# Patient Record
Sex: Female | Born: 1935
Health system: Southern US, Community
[De-identification: ages and names within clinical notes are randomized; demographics above are authoritative.]

## PROBLEM LIST (undated history)

## (undated) DIAGNOSIS — M5430 Sciatica, unspecified side: Secondary | ICD-10-CM

## (undated) DIAGNOSIS — M4120 Other idiopathic scoliosis, site unspecified: Secondary | ICD-10-CM

## (undated) DIAGNOSIS — E049 Nontoxic goiter, unspecified: Secondary | ICD-10-CM

## (undated) DIAGNOSIS — K279 Peptic ulcer, site unspecified, unspecified as acute or chronic, without hemorrhage or perforation: Secondary | ICD-10-CM

## (undated) DIAGNOSIS — N289 Disorder of kidney and ureter, unspecified: Secondary | ICD-10-CM

## (undated) DIAGNOSIS — E042 Nontoxic multinodular goiter: Secondary | ICD-10-CM

## (undated) DIAGNOSIS — R350 Frequency of micturition: Secondary | ICD-10-CM

## (undated) DIAGNOSIS — N281 Cyst of kidney, acquired: Secondary | ICD-10-CM

## (undated) DIAGNOSIS — F1020 Alcohol dependence, uncomplicated: Secondary | ICD-10-CM

## (undated) DIAGNOSIS — B009 Herpesviral infection, unspecified: Secondary | ICD-10-CM

## (undated) DIAGNOSIS — D649 Anemia, unspecified: Secondary | ICD-10-CM

## (undated) DIAGNOSIS — F419 Anxiety disorder, unspecified: Secondary | ICD-10-CM

## (undated) DIAGNOSIS — K8020 Calculus of gallbladder without cholecystitis without obstruction: Secondary | ICD-10-CM

## (undated) DIAGNOSIS — H353130 Nonexudative age-related macular degeneration, bilateral, stage unspecified: Secondary | ICD-10-CM

## (undated) DIAGNOSIS — N809 Endometriosis, unspecified: Secondary | ICD-10-CM

## (undated) DIAGNOSIS — E785 Hyperlipidemia, unspecified: Secondary | ICD-10-CM

## (undated) DIAGNOSIS — R7989 Other specified abnormal findings of blood chemistry: Secondary | ICD-10-CM

## (undated) DIAGNOSIS — I10 Essential (primary) hypertension: Secondary | ICD-10-CM

## (undated) DIAGNOSIS — R634 Abnormal weight loss: Secondary | ICD-10-CM

## (undated) HISTORY — DX: Essential (primary) hypertension: I10

## (undated) HISTORY — PX: STOMACH SURGERY: SHX791

## (undated) HISTORY — PX: REPAIR OF PERFORATED ULCER: SHX6065

## (undated) HISTORY — DX: Sciatica, unspecified side: M54.30

## (undated) HISTORY — DX: Cyst of kidney, acquired: N28.1

## (undated) HISTORY — PX: TONSILLECTOMY: SUR1361

## (undated) HISTORY — DX: Frequency of micturition: R35.0

## (undated) HISTORY — DX: Other idiopathic scoliosis, site unspecified: M41.20

## (undated) HISTORY — DX: Endometriosis, unspecified: N80.9

## (undated) HISTORY — DX: Herpesviral infection, unspecified: B00.9

## (undated) HISTORY — DX: Calculus of gallbladder without cholecystitis without obstruction: K80.20

## (undated) HISTORY — DX: Abnormal weight loss: R63.4

## (undated) HISTORY — DX: Peptic ulcer, site unspecified, unspecified as acute or chronic, without hemorrhage or perforation: K27.9

## (undated) HISTORY — DX: Hyperlipidemia, unspecified: E78.5

## (undated) HISTORY — DX: Other specified abnormal findings of blood chemistry: R79.89

## (undated) HISTORY — DX: Nontoxic goiter, unspecified: E04.9

## (undated) HISTORY — DX: Anxiety disorder, unspecified: F41.9

## (undated) HISTORY — DX: Nontoxic multinodular goiter: E04.2

## (undated) HISTORY — DX: Alcohol dependence, uncomplicated: F10.20

## (undated) HISTORY — DX: Disorder of kidney and ureter, unspecified: N28.9

## (undated) HISTORY — DX: Anemia, unspecified: D64.9

---

## 1969-02-16 HISTORY — PX: ABDOMINAL HYSTERECTOMY: SHX81

## 2001-12-22 ENCOUNTER — Other Ambulatory Visit: Admission: RE | Admit: 2001-12-22 | Discharge: 2001-12-22 | Payer: Self-pay | Admitting: Obstetrics and Gynecology

## 2004-06-15 ENCOUNTER — Emergency Department (HOSPITAL_COMMUNITY): Admission: EM | Admit: 2004-06-15 | Discharge: 2004-06-15 | Payer: Self-pay | Admitting: Emergency Medicine

## 2004-12-16 ENCOUNTER — Encounter: Admission: RE | Admit: 2004-12-16 | Discharge: 2004-12-16 | Payer: Self-pay | Admitting: Family Medicine

## 2005-08-07 ENCOUNTER — Encounter: Admission: RE | Admit: 2005-08-07 | Discharge: 2005-08-07 | Payer: Self-pay | Admitting: Family Medicine

## 2005-08-21 ENCOUNTER — Encounter: Admission: RE | Admit: 2005-08-21 | Discharge: 2005-08-21 | Payer: Self-pay | Admitting: Family Medicine

## 2005-12-10 ENCOUNTER — Encounter: Admission: RE | Admit: 2005-12-10 | Discharge: 2005-12-10 | Payer: Self-pay | Admitting: Family Medicine

## 2010-01-22 ENCOUNTER — Encounter
Admission: RE | Admit: 2010-01-22 | Discharge: 2010-01-22 | Payer: Self-pay | Source: Home / Self Care | Admitting: Internal Medicine

## 2010-01-30 ENCOUNTER — Encounter
Admission: RE | Admit: 2010-01-30 | Discharge: 2010-01-30 | Payer: Self-pay | Source: Home / Self Care | Attending: Internal Medicine | Admitting: Internal Medicine

## 2010-03-08 ENCOUNTER — Encounter: Payer: Self-pay | Admitting: Family Medicine

## 2010-03-09 ENCOUNTER — Encounter: Payer: Self-pay | Admitting: Obstetrics & Gynecology

## 2010-12-10 ENCOUNTER — Telehealth: Payer: Self-pay | Admitting: *Deleted

## 2010-12-10 NOTE — Telephone Encounter (Signed)
Opened in error

## 2012-05-11 ENCOUNTER — Other Ambulatory Visit: Payer: Self-pay | Admitting: Geriatric Medicine

## 2012-05-11 MED ORDER — NEBIVOLOL HCL 10 MG PO TABS: ORAL_TABLET | ORAL | Status: DC

## 2012-05-23 ENCOUNTER — Ambulatory Visit (INDEPENDENT_AMBULATORY_CARE_PROVIDER_SITE_OTHER): Payer: PRIVATE HEALTH INSURANCE | Admitting: Internal Medicine

## 2012-05-23 ENCOUNTER — Encounter: Payer: Self-pay | Admitting: Internal Medicine

## 2012-05-23 VITALS — BP 144/82 | HR 71 | Temp 97.1°F | Resp 16 | Ht 65.0 in | Wt 116.8 lb

## 2012-05-23 DIAGNOSIS — E785 Hyperlipidemia, unspecified: Secondary | ICD-10-CM

## 2012-05-23 DIAGNOSIS — I10 Essential (primary) hypertension: Secondary | ICD-10-CM

## 2012-05-23 DIAGNOSIS — R7309 Other abnormal glucose: Secondary | ICD-10-CM

## 2012-05-23 DIAGNOSIS — R739 Hyperglycemia, unspecified: Secondary | ICD-10-CM

## 2012-05-23 DIAGNOSIS — E042 Nontoxic multinodular goiter: Secondary | ICD-10-CM | POA: Insufficient documentation

## 2012-05-23 NOTE — Assessment & Plan Note (Signed)
On vegetarian diet.  Will f/u flp next week when fasting to see where she stands.

## 2012-05-23 NOTE — Progress Notes (Signed)
Patient ID: [name], female   DOB: [birth year], 77 y.o.   MRN: [id number]   No Known Allergies  Chief Complaint  Patient presents with  . Medical Managment of Chronic Issues    HPI: Patient is a 77 y.o.  Caucasian female seen in f/u for her chronic conditions including diet-controlled diabetes, htn, anxiety.  Says no new concerns.  Wonders about having blood work done to see how her labs are.  Seems hard to get enough protein with her couple of mos of vegetarian diet.  Is taking probiotics which is helping with her rapid transit/leaky gut problem.  Very rarely needs her xanax at bedtime.  BP is satisfactory and feels good on these doses.  Had been too much before which made her dizzy.      Review of Systems:  Review of Systems  Constitutional: Negative for fever and chills.  Respiratory: Negative for cough and shortness of breath.   Cardiovascular: Negative for chest pain.  Gastrointestinal: Negative for abdominal pain.       Stools improved with probiotic use  Genitourinary: Negative for dysuria, urgency and frequency.       No incontinence  Musculoskeletal: Negative for myalgias and falls.  Skin: Negative for rash.  Neurological: Negative for dizziness, tingling, weakness and headaches.  Endo/Heme/Allergies: Negative for polydipsia.  Psychiatric/Behavioral: Negative for depression and memory loss. The patient is not nervous/anxious.      Past Medical History  Diagnosis Date  . Type II or unspecified type diabetes mellitus without mention of complication, uncontrolled   . Hypertension   . Anxiety   . Hyperlipidemia   . Herpes simplex   . Goiter   . Renal cyst   . PUD (peptic ulcer disease)   . Endometriosis   . Unspecified disorder of kidney and ureter   . Sciatica   . Scoliosis (and kyphoscoliosis), idiopathic   . Loss of weight   . Urinary frequency   . Other abnormal blood chemistry   . Cholelithiasis   . Peptic ulcer   . Nontoxic multinodular goiter    Past  Surgical History  Procedure Laterality Date  . Abdominal hysterectomy  1971  . Repair of perforated ulcer  1973  . Stomach surgery  1977   Social History:   reports that she has quit smoking. She does not have any smokeless tobacco history on file. She reports that she does not drink alcohol or use illicit drugs.  History reviewed. No pertinent family history.  Medications: Patient's Medications  New Prescriptions   No medications on file  Previous Medications   ALPRAZOLAM (XANAX) 0.25 MG TABLET    Take 1/2 tablet by mouth once daily as needed for anxiety   AMLODIPINE-VALSARTAN (EXFORGE) 10-320 MG PER TABLET    Take 1 tablet by mouth daily. For blood pressure   NEBIVOLOL (BYSTOLIC) 10 MG TABLET    Take 1/2 tablet by mouth once daily for blood pressure.   PROBIOTIC PRODUCT (PROBIOTIC DAILY PO)    Take by mouth. Take one tablet by mouth once daily for stomach  Modified Medications   No medications on file  Discontinued Medications   No medications on file     Physical Exam:  Filed Vitals:   05/23/12 1415  BP: 144/82  Pulse: 71  Temp: 97.1 F (36.2 C)  TempSrc: Oral  Resp: 16  Height: 5\' 5"  (1.651 m)  Weight: 116 lb 12.8 oz (52.98 kg)  Physical Exam  Constitutional: She is oriented to person, place,  and time. She appears well-developed and well-nourished. No distress.  HENT:  Head: Normocephalic and atraumatic.  Neck: Normal range of motion. No JVD present.  Cardiovascular: Normal rate, regular rhythm and intact distal pulses.  Exam reveals no gallop and no friction rub.   No murmur heard. Pulmonary/Chest: Effort normal and breath sounds normal. No respiratory distress. She has no wheezes. She has no rales.  Abdominal: Soft. Bowel sounds are normal. She exhibits no distension and no mass. There is no tenderness.  Musculoskeletal: Normal range of motion. She exhibits no edema and no tenderness.  Neurological: She is alert and oriented to person, place, and time.  Skin:  Skin is warm and dry.  Psychiatric: She has a normal mood and affect. Her behavior is normal. Judgment and thought content normal.    Labs reviewed: 06/25/2010 BMP: glucose 107, BUN 24, Creatinine 1.21 HgbA1c 6.2 07/30/2010 BMP: glucose 100, BUN 23, Creatinine 1.10 TSH 0.559  Past Procedures: 11/14/2008 Renal US small right upper renal cyst with tiny calcification, kidneys otherwise normal. colonoscopy-refused mammogram -yearly-f/u GYN refuses DEXA 01/22/2010 Mammogram calcifications, left breast, no specific mammographic evidence of malignancy, right breast.  01/30/2010 US Breast 3mm cluster of probably benign left breast calcifications. A follow-up left diagnostic mammogram, with magnification views, is recommended in 6 month to assess stability. The option of sterotactic guided core needle biopsy was discussed with the patient but not recommended at this time. She is comfortable with the six month follow up mammogram    Assessment/Plan Essential hypertension, benign Has been at goal at home.  No changes needed.  Did not tolerate higher dosages.  Multinodular goiter Stable, f/u TSH.  Hyperglycemia Now eating a vegetarian diet.  Will check hba1c next week to see if this has made an improvement.    Hyperlipidemia LDL goal < 100 On vegetarian diet.  Will f/u flp next week when fasting to see where she stands.   Labs/tests ordered:  Cbc, cmp, hba1c, flp, tsh next week

## 2012-05-23 NOTE — Assessment & Plan Note (Signed)
Has been at goal at home.  No changes needed.  Did not tolerate higher dosages.

## 2012-05-23 NOTE — Assessment & Plan Note (Signed)
Stable, f/u TSH.

## 2012-05-23 NOTE — Assessment & Plan Note (Signed)
Now eating a vegetarian diet.  Will check hba1c next week to see if this has made an improvement.

## 2012-05-25 ENCOUNTER — Other Ambulatory Visit: Payer: Self-pay | Admitting: *Deleted

## 2012-05-25 MED ORDER — AMLODIPINE-VALSARTAN-HCTZ 10-320-25 MG PO TABS: ORAL_TABLET | ORAL | Status: DC

## 2012-05-27 ENCOUNTER — Other Ambulatory Visit: Payer: Self-pay | Admitting: *Deleted

## 2012-05-27 MED ORDER — AMLODIPINE BESYLATE-VALSARTAN 10-320 MG PO TABS: 1.0000 | ORAL_TABLET | Freq: Every day | ORAL | Status: DC

## 2012-07-01 ENCOUNTER — Telehealth: Payer: Self-pay | Admitting: Internal Medicine

## 2012-07-01 NOTE — Telephone Encounter (Signed)
Pt has several labs for which she missed the appt and they showed up in my overdue labs inbasket. Please call and reschedule them.

## 2012-08-29 ENCOUNTER — Other Ambulatory Visit: Payer: Self-pay | Admitting: Geriatric Medicine

## 2012-08-29 MED ORDER — ALPRAZOLAM 0.25 MG PO TABS: ORAL_TABLET | ORAL | Status: DC

## 2012-11-21 ENCOUNTER — Other Ambulatory Visit: Payer: Self-pay | Admitting: *Deleted

## 2012-11-21 MED ORDER — AMLODIPINE BESYLATE-VALSARTAN 10-320 MG PO TABS: 1.0000 | ORAL_TABLET | Freq: Every day | ORAL | Status: DC

## 2013-01-03 ENCOUNTER — Other Ambulatory Visit: Payer: Self-pay | Admitting: Internal Medicine

## 2013-01-09 ENCOUNTER — Encounter: Payer: Self-pay | Admitting: Internal Medicine

## 2013-01-09 ENCOUNTER — Ambulatory Visit (INDEPENDENT_AMBULATORY_CARE_PROVIDER_SITE_OTHER): Payer: PRIVATE HEALTH INSURANCE | Admitting: Internal Medicine

## 2013-01-09 VITALS — BP 138/86 | HR 76 | Temp 97.3°F | Ht 65.0 in | Wt 112.0 lb

## 2013-01-09 DIAGNOSIS — I781 Nevus, non-neoplastic: Secondary | ICD-10-CM

## 2013-01-09 DIAGNOSIS — E042 Nontoxic multinodular goiter: Secondary | ICD-10-CM

## 2013-01-09 DIAGNOSIS — R739 Hyperglycemia, unspecified: Secondary | ICD-10-CM

## 2013-01-09 DIAGNOSIS — Z13220 Encounter for screening for lipoid disorders: Secondary | ICD-10-CM

## 2013-01-09 DIAGNOSIS — I10 Essential (primary) hypertension: Secondary | ICD-10-CM

## 2013-01-09 DIAGNOSIS — E261 Secondary hyperaldosteronism: Secondary | ICD-10-CM

## 2013-01-09 DIAGNOSIS — R7309 Other abnormal glucose: Secondary | ICD-10-CM

## 2013-01-09 NOTE — Progress Notes (Signed)
Patient ID: [name], female   DOB: 05/16/1935, 77 y.o.   MRN: [id number]   Location:  [facility]  No Known Allergies  Chief Complaint  Patient presents with  . Foot Swelling    3 weeks ago, better now. Was eating "[employer] [place]" candy, stopped and swelling went down  . Nevus    under left breast, funny looking, darker. Also has pain in that area.    HPI: Patient is a 77 y.o. white female seen in the office today for leg swelling and left breast spot.    Review of Systems:  Review of Systems  Constitutional: Negative for fever, chills and weight loss.  HENT: Negative for congestion.   Eyes: Negative for blurred vision.  Respiratory: Negative for shortness of breath.   Cardiovascular: Negative for chest pain.  Gastrointestinal: Negative for heartburn, constipation, blood in stool and melena.  Genitourinary: Negative for dysuria.  Musculoskeletal: Negative for falls and myalgias.  Skin: Negative for rash.       Concerned about place beneath left breast  Neurological: Negative for dizziness.  Endo/Heme/Allergies: Does not bruise/bleed easily.  Psychiatric/Behavioral: Negative for depression and memory loss. The patient is nervous/anxious.      Past Medical History  Diagnosis Date  . Type II or unspecified type diabetes mellitus without mention of complication, uncontrolled   . Hypertension   . Anxiety   . Hyperlipidemia   . Herpes simplex   . Goiter   . Renal cyst   . PUD (peptic ulcer disease)   . Endometriosis   . Unspecified disorder of kidney and ureter   . Sciatica   . Scoliosis (and kyphoscoliosis), idiopathic   . Loss of weight   . Urinary frequency   . Other abnormal blood chemistry   . Cholelithiasis   . Peptic ulcer   . Nontoxic multinodular goiter     Past Surgical History  Procedure Laterality Date  . Abdominal hysterectomy  1971  . Repair of perforated ulcer  1973  . Stomach surgery  1977     Social History:   reports that she quit smoking about 19 years ago. She has never used smokeless tobacco. She reports that she does not drink alcohol or use illicit drugs.  No family history on file.  Medications: Patient's Medications  New Prescriptions   No medications on file  Previous Medications   ALPRAZOLAM (XANAX) 0.25 MG TABLET    Take 1/2 tablet by mouth once daily as needed for anxiety   AMLODIPINE-VALSARTAN (EXFORGE) 10-320 MG PER TABLET    Take 1 tablet by mouth daily. For blood pressure   BYSTOLIC 10 MG TABLET    take 1/2 tablet by mouth once daily for blood pressure   PROBIOTIC PRODUCT (PROBIOTIC DAILY PO)    Take by mouth. Take one tablet by mouth once daily for stomach  Modified Medications   No medications on file  Discontinued Medications   AMLODIPINE-VALSARTAN-HCTZ 10-320-25 MG TABS    Take one tablet once a day for blood pressure     Physical Exam: Filed Vitals:   01/09/13 1514  BP: 138/86  Pulse: 76  Temp: 97.3 F (36.3 C)  TempSrc: Oral  Height: 5\' 5"  (1.651 m)  Weight: 112 lb (50.803 kg)  Physical Exam  Constitutional: She is oriented to person, place, and time. She appears well-developed and well-nourished. No distress.  HENT:  Head: Normocephalic and atraumatic.  Eyes: EOM are normal. Pupils are equal,  round, and reactive to light.  Cardiovascular: Normal rate, regular rhythm, normal heart sounds and intact distal pulses.   Pulmonary/Chest: Effort normal and breath sounds normal. No respiratory distress.  Abdominal: Soft. Bowel sounds are normal. She exhibits no distension. There is no tenderness.  Musculoskeletal: Normal range of motion. She exhibits no edema.  Neurological: She is alert and oriented to person, place, and time. No cranial nerve deficit.  Skin: Skin is warm and dry.  Pencil eraser sized nevus under left breast    Assessment/Plan 1. Essential hypertension, benign -at goal with current therapy, has not had labs, but will get  in January: - CBC with Differential; Future - Comprehensive metabolic panel; Future -Given bystolic 5mg  samples to fill in while the 10mg  1/2 pill was on back order  2. Secondary aldosteronism Was due to excess black licorice intake in good and plenties -advised to eat them only in moderation Swelling has since resolved  3. Nevus, non-neoplastic -just beneath left breast -does have color variation--more of an actinic keratosis--will monitor annually at physical--if changes, would remove and send for pathology  4. Hyperglycemia -f/u sugars - Hemoglobin A1c; Future  5. Screening for hyperlipidemia -f/u cholesterol, eats healthy diet and exercises - Lipid panel; Future  6. Multinodular goiter -f/u labs: - CBC with Differential; Future - TSH; Future  Labs/tests ordered:  Cbc, cmp, flp, hba1c, tsh Next appt:  january

## 2013-03-09 ENCOUNTER — Other Ambulatory Visit: Payer: PRIVATE HEALTH INSURANCE

## 2013-03-09 DIAGNOSIS — Z13220 Encounter for screening for lipoid disorders: Secondary | ICD-10-CM

## 2013-03-09 DIAGNOSIS — E042 Nontoxic multinodular goiter: Secondary | ICD-10-CM

## 2013-03-09 DIAGNOSIS — R739 Hyperglycemia, unspecified: Secondary | ICD-10-CM

## 2013-03-09 DIAGNOSIS — I10 Essential (primary) hypertension: Secondary | ICD-10-CM

## 2013-03-10 ENCOUNTER — Encounter: Payer: Self-pay | Admitting: *Deleted

## 2013-03-10 LAB — CBC WITH DIFFERENTIAL/PLATELET
Basophils Absolute: 0 10*3/uL (ref 0.0–0.2)
Basos: 1 %
Eos: 2 %
Eosinophils Absolute: 0.2 10*3/uL (ref 0.0–0.4)
HCT: 35 % (ref 34.0–46.6)
Hemoglobin: 11.6 g/dL (ref 11.1–15.9)
Immature Grans (Abs): 0 10*3/uL (ref 0.0–0.1)
Immature Granulocytes: 0 %
Lymphocytes Absolute: 2.9 10*3/uL (ref 0.7–3.1)
Lymphs: 36 %
MCH: 28.8 pg (ref 26.6–33.0)
MCHC: 33.1 g/dL (ref 31.5–35.7)
MCV: 87 fL (ref 79–97)
Monocytes Absolute: 0.7 10*3/uL (ref 0.1–0.9)
Monocytes: 9 %
Neutrophils Absolute: 4.4 10*3/uL (ref 1.4–7.0)
Neutrophils Relative %: 52 %
RBC: 4.03 x10E6/uL (ref 3.77–5.28)
RDW: 13.7 % (ref 12.3–15.4)
WBC: 8.3 10*3/uL (ref 3.4–10.8)

## 2013-03-10 LAB — TSH: TSH: 0.79 u[IU]/mL (ref 0.450–4.500)

## 2013-03-10 LAB — LIPID PANEL
Chol/HDL Ratio: 1.5 ratio units (ref 0.0–4.4)
Cholesterol, Total: 200 mg/dL — ABNORMAL HIGH (ref 100–199)
HDL: 136 mg/dL (ref >39)
LDL Calculated: 48 mg/dL (ref 0–99)
Triglycerides: 81 mg/dL (ref 0–149)
VLDL Cholesterol Cal: 16 mg/dL (ref 5–40)

## 2013-03-10 LAB — COMPREHENSIVE METABOLIC PANEL
ALT: 8 IU/L (ref 0–32)
AST: 15 IU/L (ref 0–40)
Albumin/Globulin Ratio: 1.8 (ref 1.1–2.5)
Albumin: 4.4 g/dL (ref 3.5–4.8)
Alkaline Phosphatase: 82 IU/L (ref 39–117)
BUN/Creatinine Ratio: 19 (ref 11–26)
BUN: 24 mg/dL (ref 8–27)
CO2: 21 mmol/L (ref 18–29)
Calcium: 9.8 mg/dL (ref 8.7–10.3)
Chloride: 103 mmol/L (ref 97–108)
Creatinine, Ser: 1.25 mg/dL — ABNORMAL HIGH (ref 0.57–1.00)
GFR calc Af Amer: 49 mL/min/{1.73_m2} — ABNORMAL LOW (ref >59)
GFR calc non Af Amer: 42 mL/min/{1.73_m2} — ABNORMAL LOW (ref >59)
Globulin, Total: 2.4 g/dL (ref 1.5–4.5)
Glucose: 97 mg/dL (ref 65–99)
Potassium: 5 mmol/L (ref 3.5–5.2)
Sodium: 142 mmol/L (ref 134–144)
Total Bilirubin: 0.3 mg/dL (ref 0.0–1.2)
Total Protein: 6.8 g/dL (ref 6.0–8.5)

## 2013-03-10 LAB — HEMOGLOBIN A1C
Est. average glucose Bld gHb Est-mCnc: 137 mg/dL
Hgb A1c MFr Bld: 6.4 % — ABNORMAL HIGH (ref 4.8–5.6)

## 2013-03-13 ENCOUNTER — Ambulatory Visit: Payer: PRIVATE HEALTH INSURANCE | Admitting: Internal Medicine

## 2013-04-10 ENCOUNTER — Encounter: Payer: Self-pay | Admitting: Internal Medicine

## 2013-04-10 ENCOUNTER — Ambulatory Visit (INDEPENDENT_AMBULATORY_CARE_PROVIDER_SITE_OTHER): Payer: PRIVATE HEALTH INSURANCE | Admitting: Internal Medicine

## 2013-04-10 VITALS — BP 122/74 | HR 77 | Resp 10 | Wt 110.5 lb

## 2013-04-10 DIAGNOSIS — R739 Hyperglycemia, unspecified: Secondary | ICD-10-CM

## 2013-04-10 DIAGNOSIS — Z13220 Encounter for screening for lipoid disorders: Secondary | ICD-10-CM

## 2013-04-10 DIAGNOSIS — I10 Essential (primary) hypertension: Secondary | ICD-10-CM

## 2013-04-10 DIAGNOSIS — R7309 Other abnormal glucose: Secondary | ICD-10-CM

## 2013-04-10 DIAGNOSIS — E042 Nontoxic multinodular goiter: Secondary | ICD-10-CM

## 2013-04-10 DIAGNOSIS — Z7189 Other specified counseling: Secondary | ICD-10-CM | POA: Insufficient documentation

## 2013-04-10 NOTE — Progress Notes (Signed)
Patient ID: [name], female   DOB: [birth year], 78 y.o.   MRN: [id number]   Location:  [facility] [place]  No Known Allergies  Chief Complaint  Patient presents with  . Follow-up    6 week follow-up   . Health Maintenance    Patient refuses all vaccines and bone density    HPI: Patient is a 78 y.o. yo female seen in the office today for 6 wk f/u.  Not really anything new.  Has some billing concerns.  No change with lesion under left breast.  Nonpainful.  BP is great with medication.    Review of Systems:  Review of Systems  Constitutional: Negative for fever and malaise/fatigue.  HENT: Negative for congestion.   Eyes: Negative for blurred vision.  Respiratory: Negative for cough and shortness of breath.   Cardiovascular: Negative for chest pain and leg swelling.  Gastrointestinal: Negative for abdominal pain.  Genitourinary: Negative for dysuria.  Musculoskeletal: Negative for falls and myalgias.  Skin: Negative for rash.  Neurological: Negative for dizziness, loss of consciousness and weakness.  Endo/Heme/Allergies: Does not bruise/bleed easily.  Psychiatric/Behavioral: Negative for depression and memory loss. The patient is nervous/anxious.     Past Medical History  Diagnosis Date  . Type II or unspecified type diabetes mellitus without mention of complication, uncontrolled   . Hypertension   . Anxiety   . Hyperlipidemia   . Herpes simplex   . Goiter   . Renal cyst   . PUD (peptic ulcer disease)   . Endometriosis   . Unspecified disorder of kidney and ureter   . Sciatica   . Scoliosis (and kyphoscoliosis), idiopathic   . Loss of weight   . Urinary frequency   . Other abnormal blood chemistry   . Cholelithiasis   . Peptic ulcer   . Nontoxic multinodular goiter     Past Surgical History  Procedure Laterality Date  . Abdominal hysterectomy  1971  . Repair of perforated ulcer  1973 &1975    Dr Vida Rigger  . Stomach surgery   1977    removed 1/2 stomach    Social History:   reports that she quit smoking about 19 years ago. She has never used smokeless tobacco. She reports that she does not drink alcohol or use illicit drugs.  Family History  Problem Relation Age of Onset  . Obesity Mother     Medications: Patient's Medications  New Prescriptions   No medications on file  Previous Medications   ALPRAZOLAM (XANAX) 0.25 MG TABLET    Take 1/2 tablet by mouth once daily as needed for anxiety   AMLODIPINE-VALSARTAN (EXFORGE) 10-320 MG PER TABLET    Take 1 tablet by mouth daily. For blood pressure   BYSTOLIC 10 MG TABLET    take 1/2 tablet by mouth once daily for blood pressure   PROBIOTIC PRODUCT (PROBIOTIC DAILY PO)    Take by mouth. Take one tablet by mouth once daily for stomach  Modified Medications   No medications on file  Discontinued Medications   No medications on file     Physical Exam: Filed Vitals:   04/10/13 1507  BP: 122/74  Pulse: 77  Resp: 10  Weight: 110 lb 8 oz (50.122 kg)  SpO2: 96%  Physical Exam  Constitutional: She is oriented to person, place, and time. She appears well-developed and well-nourished. No distress.  Cardiovascular: Normal rate, regular rhythm, normal heart sounds and intact distal pulses.   Pulmonary/Chest:  Effort normal and breath sounds normal. No respiratory distress.  Abdominal: Soft. Bowel sounds are normal.  Musculoskeletal: Normal range of motion.  Neurological: She is alert and oriented to person, place, and time.  Skin: Skin is warm and dry.  Psychiatric:  anxious    Labs reviewed: Basic Metabolic Panel:  Recent Labs  03/09/13 0829  NA 142  K 5.0  CL 103  CO2 21  GLUCOSE 97  BUN 24  CREATININE 1.25*  CALCIUM 9.8  TSH 0.790   Liver Function Tests:  Recent Labs  03/09/13 0829  AST 15  ALT 8  ALKPHOS 82  BILITOT 0.3  PROT 6.8  CBC:  Recent Labs  03/09/13 0829  WBC 8.3  NEUTROABS 4.4  HGB 11.6  HCT 35.0  MCV 87   Lipid  Panel:  Recent Labs  03/09/13 0829  HDL 136  LDLCALC 48  TRIG 81  CHOLHDL 1.5   Lab Results  Component Value Date   HGBA1C 6.4* 03/09/2013    Assessment/Plan 1. Hyperglycemia - does well with her diet -f/u labs: - Hemoglobin A1c; Future - Basic metabolic panel; Future  2. Essential hypertension, benign -bp at goal with bystolic and amlodipine/valsartan, f/u lytes - Basic metabolic panel; Future  3. Multinodular goiter -tsh was normal  4. Screening for hyperlipidemia -lipids satisfactory with very high good cholesterol, good diet and exercise  Labs/tests ordered: Orders Placed This Encounter  Procedures  . Hemoglobin A1c    Standing Status: Future     Number of Occurrences:      Standing Expiration Date: 04/10/2014  . Basic metabolic panel    Standing Status: Future     Number of Occurrences:      Standing Expiration Date: 04/10/2014    Next appt:  6 mos, labs before

## 2013-04-27 ENCOUNTER — Other Ambulatory Visit: Payer: Self-pay | Admitting: *Deleted

## 2013-04-27 MED ORDER — ALPRAZOLAM 0.25 MG PO TABS: ORAL_TABLET | ORAL | Status: DC

## 2013-06-26 ENCOUNTER — Other Ambulatory Visit: Payer: Self-pay | Admitting: *Deleted

## 2013-06-26 MED ORDER — AMLODIPINE BESYLATE-VALSARTAN 10-320 MG PO TABS: 1.0000 | ORAL_TABLET | Freq: Every day | ORAL | Status: DC

## 2013-06-26 NOTE — Telephone Encounter (Signed)
rx sent to the pharmacy. 

## 2013-06-26 NOTE — Telephone Encounter (Signed)
[facility] 

## 2013-09-06 ENCOUNTER — Other Ambulatory Visit: Payer: Self-pay | Admitting: *Deleted

## 2013-09-06 MED ORDER — NEBIVOLOL HCL 10 MG PO TABS: ORAL_TABLET | ORAL | Status: DC

## 2013-09-06 NOTE — Telephone Encounter (Signed)
[facility] 

## 2013-10-03 ENCOUNTER — Other Ambulatory Visit: Payer: PRIVATE HEALTH INSURANCE

## 2013-10-03 DIAGNOSIS — R739 Hyperglycemia, unspecified: Secondary | ICD-10-CM

## 2013-10-03 DIAGNOSIS — I10 Essential (primary) hypertension: Secondary | ICD-10-CM

## 2013-10-04 ENCOUNTER — Encounter: Payer: Self-pay | Admitting: *Deleted

## 2013-10-04 LAB — BASIC METABOLIC PANEL
BUN/Creatinine Ratio: 15 (ref 11–26)
BUN: 19 mg/dL (ref 8–27)
CO2: 19 mmol/L (ref 18–29)
Calcium: 9.6 mg/dL (ref 8.7–10.3)
Chloride: 105 mmol/L (ref 97–108)
Creatinine, Ser: 1.25 mg/dL — ABNORMAL HIGH (ref 0.57–1.00)
GFR calc Af Amer: 49 mL/min/{1.73_m2} — ABNORMAL LOW (ref >59)
GFR calc non Af Amer: 42 mL/min/{1.73_m2} — ABNORMAL LOW (ref >59)
Glucose: 97 mg/dL (ref 65–99)
Potassium: 4.7 mmol/L (ref 3.5–5.2)
Sodium: 141 mmol/L (ref 134–144)

## 2013-10-04 LAB — HEMOGLOBIN A1C
Est. average glucose Bld gHb Est-mCnc: 134 mg/dL
Hgb A1c MFr Bld: 6.3 % — ABNORMAL HIGH (ref 4.8–5.6)

## 2013-10-05 ENCOUNTER — Ambulatory Visit (INDEPENDENT_AMBULATORY_CARE_PROVIDER_SITE_OTHER): Payer: PRIVATE HEALTH INSURANCE | Admitting: Internal Medicine

## 2013-10-05 ENCOUNTER — Encounter: Payer: Self-pay | Admitting: Internal Medicine

## 2013-10-05 VITALS — BP 110/60 | HR 64 | Temp 98.2°F | Ht 63.75 in | Wt 109.8 lb

## 2013-10-05 DIAGNOSIS — Z9189 Other specified personal risk factors, not elsewhere classified: Secondary | ICD-10-CM

## 2013-10-05 DIAGNOSIS — R197 Diarrhea, unspecified: Secondary | ICD-10-CM

## 2013-10-05 DIAGNOSIS — Z13220 Encounter for screening for lipoid disorders: Secondary | ICD-10-CM

## 2013-10-05 DIAGNOSIS — R7309 Other abnormal glucose: Secondary | ICD-10-CM

## 2013-10-05 DIAGNOSIS — R739 Hyperglycemia, unspecified: Secondary | ICD-10-CM

## 2013-10-05 DIAGNOSIS — Z1211 Encounter for screening for malignant neoplasm of colon: Secondary | ICD-10-CM

## 2013-10-05 DIAGNOSIS — E042 Nontoxic multinodular goiter: Secondary | ICD-10-CM

## 2013-10-05 DIAGNOSIS — Z87898 Personal history of other specified conditions: Secondary | ICD-10-CM

## 2013-10-05 DIAGNOSIS — I10 Essential (primary) hypertension: Secondary | ICD-10-CM

## 2013-10-05 NOTE — Progress Notes (Signed)
Patient ID: [name], female   DOB: 1935-07-30, 78 y.o.   MRN: [id number]   Location:  [facility] [place] [facility]  Code status:  Does not yet have living will, health care POA done yet;  She does not really have family  No Known Allergies  Chief Complaint  Patient presents with  . Medical Management of Chronic Issues    6 month f/u, discuss labs, & [employer] RX Form.  . other    neg for fall/depression screening  . Weight Loss    c/o weight loss of 3-4 lbs in the last 6 months    HPI: Patient is a 78 y.o. white female seen in the office today for med mgt chronic diseases.  Says she has lost 7 lbs since 4/14.  Has not tried to lose weight and eating has not changed.  BP has been low now 110/60 today.  Exforge has really lowered it.  Had been having high systolic for a while.  Is taking only 1/2 of bystolic $RemoveBef'10mg'VhXOIsTlxG$ .  Does have a monitor at home.  Says she will stop bystolic and we'll see how it goes.   She is doing a lot of meditation so that may be lowering her bp.  Had no trauma or clear cause for bp going up to begin with.  Walks regularly and does some yoga.  Tries to have stress free life.     Agrees to repeat mammo.  Needed left diagnostic and [place] last time.    Will not have colonoscopy.  Had a perforated ulcer and had part of her stomach removed in 1972.  Cannot eat a lot at once for that reason.  Discussed cologuard with her.    She is completely functionally independent in IADLs and ADLs.  She has no memory loss problems but will assess memory at next visit.  Review of Systems:  Review of Systems  Constitutional: Positive for weight loss. Negative for fever, chills, malaise/fatigue and diaphoresis.  HENT: Negative for congestion.   Eyes: Negative for blurred vision.       Wears glasses  Respiratory: Negative for cough and shortness of breath.   Cardiovascular: Negative for chest pain and leg swelling.  Gastrointestinal: Positive for diarrhea.  Negative for abdominal pain, constipation, blood in stool and melena.       Can only eat small amts at a time due to prior gastric surgery  Genitourinary: Negative for dysuria, urgency and frequency.  Musculoskeletal: Negative for falls and myalgias.  Skin: Negative for itching and rash.  Neurological: Negative for dizziness, loss of consciousness, weakness and headaches.  Endo/Heme/Allergies: Does not bruise/bleed easily.  Psychiatric/Behavioral: Negative for depression and memory loss. The patient is nervous/anxious.      Past Medical History  Diagnosis Date  . Type II or unspecified type diabetes mellitus without mention of complication, uncontrolled   . Hypertension   . Anxiety   . Hyperlipidemia   . Herpes simplex   . Goiter   . Renal cyst   . PUD (peptic ulcer disease)   . Endometriosis   . Unspecified disorder of kidney and ureter   . Sciatica   . Scoliosis (and kyphoscoliosis), idiopathic   . Loss of weight   . Urinary frequency   . Other abnormal blood chemistry   . Cholelithiasis   . Peptic ulcer   . Nontoxic multinodular goiter     Past Surgical History  Procedure Laterality Date  . Abdominal hysterectomy  1971  .  Repair of perforated ulcer  1973 &1975    Dr Vida Rigger  . Stomach surgery  1977    removed 1/2 stomach    Social History:   reports that she quit smoking about 19 years ago. She has never used smokeless tobacco. She reports that she does not drink alcohol or use illicit drugs.  Family History  Problem Relation Age of Onset  . Obesity Mother     Medications: Patient's Medications  New Prescriptions   No medications on file  Previous Medications   ALPRAZOLAM (XANAX) 0.25 MG TABLET    Take 1/2 tablet by mouth once daily as needed for anxiety   AMLODIPINE-VALSARTAN (EXFORGE) 10-320 MG PER TABLET    Take 1 tablet by mouth daily. For blood pressure   NEBIVOLOL (BYSTOLIC) 10 MG TABLET    Take 1/2 tablet by mouth once daily for blood pressure    PROBIOTIC PRODUCT (PROBIOTIC DAILY PO)    Take by mouth. Take one tablet by mouth once daily for stomach  Modified Medications   No medications on file  Discontinued Medications   No medications on file     Physical Exam: Filed Vitals:   10/05/13 1428  BP: 110/60  Pulse: 64  Temp: 98.2 F (36.8 C)  TempSrc: Oral  Height: 5' 3.75" (1.619 m)  Weight: 109 lb 12.8 oz (49.805 kg)  SpO2: 98%  Physical Exam  Constitutional: She is oriented to person, place, and time. No distress.  Thin white female  HENT:  Head: Normocephalic and atraumatic.  Cardiovascular: Normal rate, regular rhythm, normal heart sounds and intact distal pulses.   Pulmonary/Chest: Effort normal and breath sounds normal. No respiratory distress.  Abdominal: Soft. Bowel sounds are normal. She exhibits no distension and no mass. There is no tenderness.  Musculoskeletal: Normal range of motion. She exhibits no edema and no tenderness.  Neurological: She is alert and oriented to person, place, and time.  Skin: Skin is warm and dry. There is pallor.  Psychiatric: She has a normal mood and affect.    Labs reviewed: Basic Metabolic Panel:  Recent Labs  03/09/13 0829 10/03/13 0828  NA 142 141  K 5.0 4.7  CL 103 105  CO2 21 19  GLUCOSE 97 97  BUN 24 19  CREATININE 1.25* 1.25*  CALCIUM 9.8 9.6  TSH 0.790  --    Liver Function Tests:  Recent Labs  03/09/13 0829  AST 15  ALT 8  ALKPHOS 82  BILITOT 0.3  PROT 6.8  CBC:  Recent Labs  03/09/13 0829  WBC 8.3  NEUTROABS 4.4  HGB 11.6  HCT 35.0  MCV 87   Lipid Panel:  Recent Labs  03/09/13 0829  HDL 136  LDLCALC 48  TRIG 81  CHOLHDL 1.5   Lab Results  Component Value Date   HGBA1C 6.3* 10/03/2013    Assessment/Plan 1. Hyperglycemia - cont dietary modification to prevent progression to diabetes, cont valsartan (in exforge), cont exercising - Hemoglobin A1c; Future  2. Essential hypertension, benign -she is to come off bystolic $RemoveBefo'5mg'OowLOlVeDca$ ,  but if her blood pressures are measuring over 140/90, she is to call me so we can put her back on a low dose of bystolic 2.$RemoveBefor'5mg'zlMapSftPrSX$  only - CBC With differential/Platelet; Future - Comprehensive metabolic panel; Future  3. Multinodular goiter - due to weight loss, reassess tsh - TSH; Future  4. Frequent loose stools - due to her prior gastric surgery, makes it hard for her to maintain her weight--eats frequent, small  meals, unchanged - CBC With differential/Platelet; Future  5. History of abnormal mammogram -has not followed up on this as recommended previously but agreed to when I offered to put in referral today   6. Colon cancer screening -agrees to cologuard testing as first step for this--form done today to get kit delivered to her  7. Screening, lipid - has had good hdl and slightly elevated ldl in the past, but not enough to warrant treatment in an otherwise healthy female at her age - Lipid panel; Future  Labs/tests ordered:   Orders Placed This Encounter  Procedures  . CBC With differential/Platelet    Standing Status: Future     Number of Occurrences:      Standing Expiration Date: 10/06/2014  . Comprehensive metabolic panel    Standing Status: Future     Number of Occurrences:      Standing Expiration Date: 10/06/2014  . Hemoglobin A1c    Standing Status: Future     Number of Occurrences:      Standing Expiration Date: 10/06/2014  . Lipid panel    Standing Status: Future     Number of Occurrences:      Standing Expiration Date: 10/06/2014  . TSH    Standing Status: Future     Number of Occurrences:      Standing Expiration Date: 10/06/2014    Next appt:  6 mos with MMSE (has not had one recently)

## 2013-10-05 NOTE — Patient Instructions (Signed)
Goal bp is less than 140/90.  If you are staying over that, we will need to add back just bystolic 2.5mg .

## 2013-10-06 ENCOUNTER — Other Ambulatory Visit: Payer: Self-pay | Admitting: Internal Medicine

## 2013-10-06 DIAGNOSIS — R921 Mammographic calcification found on diagnostic imaging of breast: Secondary | ICD-10-CM

## 2013-10-31 ENCOUNTER — Ambulatory Visit
Admission: RE | Admit: 2013-10-31 | Discharge: 2013-10-31 | Disposition: A | Discharge status: Home / Self Care | Payer: Medicare Other | Source: Ambulatory Visit | Attending: Internal Medicine | Admitting: Internal Medicine

## 2013-10-31 DIAGNOSIS — R921 Mammographic calcification found on diagnostic imaging of breast: Secondary | ICD-10-CM

## 2013-11-15 ENCOUNTER — Other Ambulatory Visit: Payer: Self-pay | Admitting: *Deleted

## 2013-11-15 MED ORDER — ALPRAZOLAM 0.25 MG PO TABS: ORAL_TABLET | ORAL | Status: DC

## 2013-11-15 NOTE — Telephone Encounter (Signed)
[facility] 

## 2013-12-19 ENCOUNTER — Other Ambulatory Visit: Payer: Self-pay | Admitting: *Deleted

## 2013-12-19 MED ORDER — AMLODIPINE BESYLATE-VALSARTAN 10-320 MG PO TABS: 1.0000 | ORAL_TABLET | Freq: Every day | ORAL | Status: DC

## 2013-12-19 NOTE — Telephone Encounter (Signed)
[facility] 

## 2014-01-05 ENCOUNTER — Telehealth: Payer: Self-pay | Admitting: *Deleted

## 2014-01-05 NOTE — Telephone Encounter (Signed)
Called patient to ask about why she hasn't completed her cologuard process, No answer, left a message with call back number.

## 2014-02-01 ENCOUNTER — Telehealth: Payer: Self-pay | Admitting: *Deleted

## 2014-02-01 NOTE — Telephone Encounter (Signed)
Patient called and stated that she has a red, itchy rash and wanted to be seen. Nothing is available for today to be seen. Offered patient an appointment for 8:15 in the morning and a couple other times. Patient refused and stated that she was going to urgent care.

## 2014-02-16 HISTORY — PX: CATARACT EXTRACTION W/ INTRAOCULAR LENS  IMPLANT, BILATERAL: SHX1307

## 2014-03-06 ENCOUNTER — Telehealth: Payer: Self-pay | Admitting: *Deleted

## 2014-03-06 NOTE — Telephone Encounter (Signed)
Receive a fax from [facility] [phone] regarding they are not going to cover patient's medication Exforge. Given to Dr. Mariea Clonts to review. Member ID # [id number]

## 2014-03-08 ENCOUNTER — Other Ambulatory Visit: Payer: Self-pay | Admitting: Internal Medicine

## 2014-03-13 ENCOUNTER — Telehealth: Payer: Self-pay | Admitting: *Deleted

## 2014-03-13 NOTE — Telephone Encounter (Signed)
Received Prior Authorization Form from [employer] [phone] Fax #: [phone]. Filled out and given to Dr. Mariea Clonts to review and sign.

## 2014-03-15 NOTE — Telephone Encounter (Addendum)
Exforge Approved through 02/16/2015. Patient Notified.

## 2014-04-05 ENCOUNTER — Other Ambulatory Visit: Payer: Medicare Other

## 2014-04-05 DIAGNOSIS — R197 Diarrhea, unspecified: Secondary | ICD-10-CM

## 2014-04-05 DIAGNOSIS — I10 Essential (primary) hypertension: Secondary | ICD-10-CM

## 2014-04-05 DIAGNOSIS — R7309 Other abnormal glucose: Secondary | ICD-10-CM | POA: Diagnosis not present

## 2014-04-05 DIAGNOSIS — R739 Hyperglycemia, unspecified: Secondary | ICD-10-CM

## 2014-04-05 DIAGNOSIS — Z13220 Encounter for screening for lipoid disorders: Secondary | ICD-10-CM | POA: Diagnosis not present

## 2014-04-05 DIAGNOSIS — E042 Nontoxic multinodular goiter: Secondary | ICD-10-CM

## 2014-04-06 LAB — COMPREHENSIVE METABOLIC PANEL
ALT: 12 IU/L (ref 0–32)
AST: 18 IU/L (ref 0–40)
Albumin/Globulin Ratio: 1.7 (ref 1.1–2.5)
Albumin: 4.3 g/dL (ref 3.5–4.8)
Alkaline Phosphatase: 88 IU/L (ref 39–117)
BUN/Creatinine Ratio: 14 (ref 11–26)
BUN: 16 mg/dL (ref 8–27)
Bilirubin Total: 0.2 mg/dL (ref 0.0–1.2)
CO2: 21 mmol/L (ref 18–29)
Calcium: 9.7 mg/dL (ref 8.7–10.3)
Chloride: 102 mmol/L (ref 97–108)
Creatinine, Ser: 1.13 mg/dL — ABNORMAL HIGH (ref 0.57–1.00)
GFR calc Af Amer: 55 mL/min/{1.73_m2} — ABNORMAL LOW (ref >59)
GFR calc non Af Amer: 47 mL/min/{1.73_m2} — ABNORMAL LOW (ref >59)
Globulin, Total: 2.6 g/dL (ref 1.5–4.5)
Glucose: 105 mg/dL — ABNORMAL HIGH (ref 65–99)
Potassium: 4.2 mmol/L (ref 3.5–5.2)
Sodium: 138 mmol/L (ref 134–144)
Total Protein: 6.9 g/dL (ref 6.0–8.5)

## 2014-04-06 LAB — CBC WITH DIFFERENTIAL
Basophils Absolute: 0 10*3/uL (ref 0.0–0.2)
Basos: 1 %
Eos: 2 %
Eosinophils Absolute: 0.2 10*3/uL (ref 0.0–0.4)
HCT: 36.1 % (ref 34.0–46.6)
Hemoglobin: 11.7 g/dL (ref 11.1–15.9)
Immature Grans (Abs): 0 10*3/uL (ref 0.0–0.1)
Immature Granulocytes: 0 %
Lymphocytes Absolute: 3.3 10*3/uL — ABNORMAL HIGH (ref 0.7–3.1)
Lymphs: 38 %
MCH: 28.7 pg (ref 26.6–33.0)
MCHC: 32.4 g/dL (ref 31.5–35.7)
MCV: 89 fL (ref 79–97)
Monocytes Absolute: 0.8 10*3/uL (ref 0.1–0.9)
Monocytes: 10 %
Neutrophils Absolute: 4.2 10*3/uL (ref 1.4–7.0)
Neutrophils Relative %: 49 %
RBC: 4.08 x10E6/uL (ref 3.77–5.28)
RDW: 14.7 % (ref 12.3–15.4)
WBC: 8.5 10*3/uL (ref 3.4–10.8)

## 2014-04-06 LAB — LIPID PANEL
Chol/HDL Ratio: 1.5 ratio units (ref 0.0–4.4)
Cholesterol, Total: 206 mg/dL — ABNORMAL HIGH (ref 100–199)
HDL: 142 mg/dL (ref >39)
LDL Calculated: 49 mg/dL (ref 0–99)
Triglycerides: 75 mg/dL (ref 0–149)
VLDL Cholesterol Cal: 15 mg/dL (ref 5–40)

## 2014-04-06 LAB — HEMOGLOBIN A1C
Est. average glucose Bld gHb Est-mCnc: 134 mg/dL
Hgb A1c MFr Bld: 6.3 % — ABNORMAL HIGH (ref 4.8–5.6)

## 2014-04-06 LAB — TSH: TSH: 1.01 u[IU]/mL (ref 0.450–4.500)

## 2014-04-09 ENCOUNTER — Ambulatory Visit (INDEPENDENT_AMBULATORY_CARE_PROVIDER_SITE_OTHER): Payer: Medicare Other | Admitting: Internal Medicine

## 2014-04-09 ENCOUNTER — Encounter: Payer: Self-pay | Admitting: Internal Medicine

## 2014-04-09 VITALS — BP 110/62 | HR 72 | Temp 97.2°F | Resp 12 | Ht 64.0 in | Wt 108.0 lb

## 2014-04-09 DIAGNOSIS — I10 Essential (primary) hypertension: Secondary | ICD-10-CM | POA: Diagnosis not present

## 2014-04-09 DIAGNOSIS — R739 Hyperglycemia, unspecified: Secondary | ICD-10-CM

## 2014-04-09 DIAGNOSIS — E042 Nontoxic multinodular goiter: Secondary | ICD-10-CM

## 2014-04-09 DIAGNOSIS — E785 Hyperlipidemia, unspecified: Secondary | ICD-10-CM

## 2014-04-09 DIAGNOSIS — I781 Nevus, non-neoplastic: Secondary | ICD-10-CM | POA: Diagnosis not present

## 2014-04-09 DIAGNOSIS — Z0000 Encounter for general adult medical examination without abnormal findings: Secondary | ICD-10-CM

## 2014-04-09 NOTE — Progress Notes (Signed)
Passed clock drawing 

## 2014-04-09 NOTE — Progress Notes (Signed)
Patient ID: [name], female   DOB: [birth year], 79 y.o.   MRN: [id number]   Location:  [facility]  Code Status: Advanced Directive information Does patient have an advance directive?: No, Would patient like information on creating an advanced directive?: Yes - Educational materials given (patient does have the blank form, but she has not done it yet)   No Known Allergies  Chief Complaint  Patient presents with  . Annual Exam    Yearl check-up, discuss labs (copy printed). No recent EKG  . MMSE    29/30, passed clock drawing  . Immunizations    Refused to update ALL immunizations     HPI: Patient is a 79 y.o. white female seen in the office today for her annual exam.  MMSE done scored 29/30 and passed clock. Refuses ALL immunization updates. No falls.    Mood is ok, says there are changes all around.    Had to get prior auth for exforge.  Had difficulty with the generic.  Was very firm with the insurance company and got it covered.  Huge difference in price but it works for her.     Review of Systems:  Review of Systems  Constitutional: Negative for fever and chills.  HENT: Negative for hearing loss.   Eyes: Positive for blurred vision.       Has cataracts; just got new glasses; may need surgery in the future, avoids driving at night  Respiratory: Negative for shortness of breath.   Cardiovascular: Negative for chest pain, palpitations and leg swelling.  Gastrointestinal: Negative for abdominal pain, diarrhea, constipation, blood in stool and melena.  Genitourinary: Positive for frequency. Negative for dysuria and urgency.  Musculoskeletal: Negative for falls.  Skin: Positive for rash.       Occasionally getting hives--happened once between Christmas and New Year's, occasional since, used very [name] children's benadryl--eats consistently so doesn't think that's it; seems more on hands--sometimes go away if submerges hands, goes  away or if she just relaxes  Neurological: Negative for dizziness and headaches.  Psychiatric/Behavioral: Negative for depression and memory loss.     Past Medical History  Diagnosis Date  . Type II or unspecified type diabetes mellitus without mention of complication, uncontrolled   . Hypertension   . Anxiety   . Hyperlipidemia   . Herpes simplex   . Goiter   . Renal cyst   . PUD (peptic ulcer disease)   . Endometriosis   . Unspecified disorder of kidney and ureter   . Sciatica   . Scoliosis (and kyphoscoliosis), idiopathic   . Loss of weight   . Urinary frequency   . Other abnormal blood chemistry   . Cholelithiasis   . Peptic ulcer   . Nontoxic multinodular goiter     Past Surgical History  Procedure Laterality Date  . Abdominal hysterectomy  1971  . Repair of perforated ulcer  1973 &1975    Dr Vida Rigger  . Stomach surgery  1977    removed 1/2 stomach    Social History:   reports that she quit smoking about 20 years ago. She has never used smokeless tobacco. She reports that she does not drink alcohol or use illicit drugs.  Family History  Problem Relation Age of Onset  . Obesity Mother     Medications: Patient's Medications  New Prescriptions   No medications on file  Previous Medications   ALPRAZOLAM (XANAX) 0.25 MG TABLET    Take  1/2 tablet by mouth once daily as needed for anxiety   AMLODIPINE-VALSARTAN (EXFORGE) 10-320 MG PER TABLET    Take 1 tablet by mouth daily. For blood pressure   BYSTOLIC 10 MG TABLET    Take 10 mg by mouth daily. 1 by mouth daily   PROBIOTIC PRODUCT (PROBIOTIC DAILY PO)    Take by mouth. Take one tablet by mouth once daily for stomach  Modified Medications   No medications on file  Discontinued Medications   No medications on file     Physical Exam: Filed Vitals:   04/09/14 1349  BP: 110/62  Pulse: 72  Temp: 97.2 F (36.2 C)  TempSrc: Oral  Resp: 12  Height: 5\' 4"  (1.626 m)  Weight: 108 lb (48.988 kg)  SpO2: 99%    Physical Exam  Labs reviewed: Basic Metabolic Panel:  Recent Labs  10/03/13 0828 04/05/14 0842  NA 141 138  K 4.7 4.2  CL 105 102  CO2 19 21  GLUCOSE 97 105*  BUN 19 16  CREATININE 1.25* 1.13*  CALCIUM 9.6 9.7  TSH  --  1.010   Liver Function Tests:  Recent Labs  04/05/14 0842  AST 18  ALT 12  ALKPHOS 88  BILITOT 0.2  PROT 6.9   No results for input(s): LIPASE, AMYLASE in the last 8760 hours. No results for input(s): AMMONIA in the last 8760 hours. CBC:  Recent Labs  04/05/14 0842  WBC 8.5  NEUTROABS 4.2  HGB 11.7  HCT 36.1  MCV 89   Lipid Panel:  Recent Labs  04/05/14 0842  HDL 142  LDLCALC 49  TRIG 75  CHOLHDL 1.5   Lab Results  Component Value Date   HGBA1C 6.3* 04/05/2014   Assessment/Plan 1. Routine general medical examination at a health care facility -refuses ALL vaccinations despite encouragement -has been healthy and feels she doesn't need them -mammo 9/15 was normal -refuses cscope and cologuard and hemoccult adamantly -not depressed, no falls, MMSE normal for her age  79. Essential hypertension - bp at goal with exforge and bystolic - EKG 16-XWRU - CBC with Differential/Platelet; Future - Basic metabolic panel; Future  3. Hyperlipidemia - at goal--has very high hdl and normal ldl and tg - EKG 12-Lead  4. Hyperglycemia -stable hba1c in prediabetic range - Hemoglobin A1c; Future  5. Multinodular goiter -TSH was wnl  6. Nevus, non-neoplastic -beneath left breast--cont to monitor -irritates her at times due to location, but does not want anything done at this point  Labs/tests ordered:   Orders Placed This Encounter  Procedures  . CBC with Differential/Platelet    Standing Status: Future     Number of Occurrences:      Standing Expiration Date: 04/10/2015  . Basic metabolic panel    Standing Status: Future     Number of Occurrences:      Standing Expiration Date: 04/10/2015  . Hemoglobin A1c    Standing Status:  Future     Number of Occurrences:      Standing Expiration Date: 04/10/2015  . EKG 12-Lead    Next appt:  6 mos with labs before  Jomar Denz L. Karie Skowron, D.O. [facility] [address], [place] [address] Cell Phone (Mon-Fri 8am-5pm):  [phone] On Call:  [phone] & follow prompts after 5pm & weekends Office Phone:  [phone] Office Fax:  [phone]

## 2014-04-20 ENCOUNTER — Other Ambulatory Visit: Payer: Self-pay | Admitting: Internal Medicine

## 2014-04-23 ENCOUNTER — Encounter: Payer: Self-pay | Admitting: Nurse Practitioner

## 2014-04-23 ENCOUNTER — Ambulatory Visit (INDEPENDENT_AMBULATORY_CARE_PROVIDER_SITE_OTHER): Payer: Medicare Other | Admitting: Nurse Practitioner

## 2014-04-23 VITALS — BP 120/62 | HR 82 | Temp 98.1°F | Resp 18 | Ht 64.0 in | Wt 106.0 lb

## 2014-04-23 DIAGNOSIS — N390 Urinary tract infection, site not specified: Secondary | ICD-10-CM | POA: Diagnosis not present

## 2014-04-23 MED ORDER — CIPROFLOXACIN HCL 250 MG PO TABS: 250.0000 mg | ORAL_TABLET | Freq: Two times a day (BID) | ORAL | Status: DC

## 2014-04-23 NOTE — Progress Notes (Signed)
Patient ID: [name], female   DOB: 1935-08-11, 79 y.o.   MRN: [id number]    PCP: Hollace Kinnier, DO  No Known Allergies  Chief Complaint  Patient presents with  . Acute Visit     HPI: Patient is a 79 y.o. female seen in the office today for frequent urination and not feeling like she is emptying her bladder. Last night she was up every hour. Discomfort when urinating. Suprapubic pressure.  Take cranberry pills and blueberry tea for preventive for UTI has not had one in many years but does have hx of UTI in the past.  No fever or chills.   Review of Systems:  Review of Systems  Constitutional: Negative for activity change, appetite change, fatigue and unexpected weight change.  HENT: Negative for congestion and hearing loss.   Eyes: Negative.   Respiratory: Negative for shortness of breath.   Cardiovascular: Negative for chest pain, palpitations and leg swelling.  Gastrointestinal: Positive for abdominal pain. Negative for diarrhea and constipation.  Genitourinary: Positive for dysuria, frequency and difficulty urinating. Negative for hematuria and flank pain.    Past Medical History  Diagnosis Date  . Type II or unspecified type diabetes mellitus without mention of complication, uncontrolled   . Hypertension   . Anxiety   . Hyperlipidemia   . Herpes simplex   . Goiter   . Renal cyst   . PUD (peptic ulcer disease)   . Endometriosis   . Unspecified disorder of kidney and ureter   . Sciatica   . Scoliosis (and kyphoscoliosis), idiopathic   . Loss of weight   . Urinary frequency   . Other abnormal blood chemistry   . Cholelithiasis   . Peptic ulcer   . Nontoxic multinodular goiter    Past Surgical History  Procedure Laterality Date  . Abdominal hysterectomy  1971  . Repair of perforated ulcer  1973 &1975    Dr Vida Rigger  . Stomach surgery  1977    removed 1/2 stomach   Social History:   reports that she quit smoking about 20 years ago. She has never used smokeless  tobacco. She reports that she does not drink alcohol or use illicit drugs.  Family History  Problem Relation Age of Onset  . Obesity Mother     Medications: Patient's Medications  New Prescriptions   No medications on file  Previous Medications   ALPRAZOLAM (XANAX) 0.25 MG TABLET    Take 1/2 tablet by mouth once daily as needed for anxiety   BYSTOLIC 10 MG TABLET    Take 10 mg by mouth daily. 1 by mouth daily   EXFORGE 10-320 MG PER TABLET    take 1 tablet by mouth once daily   PROBIOTIC PRODUCT (PROBIOTIC DAILY PO)    Take by mouth. Take one tablet by mouth once daily for stomach  Modified Medications   No medications on file  Discontinued Medications   No medications on file     Physical Exam:  Filed Vitals:   04/23/14 1500  BP: 120/62  Pulse: 82  Temp: 98.1 F (36.7 C)  TempSrc: Oral  Resp: 18  Height: 5\' 4"  (1.626 m)  Weight: 106 lb (48.081 kg)  SpO2: 97%    Physical Exam  Constitutional: She is oriented to person, place, and time. She appears well-developed and well-nourished. No distress.  Cardiovascular: Normal rate, regular rhythm and normal heart sounds.   Pulmonary/Chest: Effort normal and breath sounds normal.  Abdominal: Soft. Bowel sounds are  normal. She exhibits no distension. There is no tenderness. There is no rebound and no guarding.  Neurological: She is alert and oriented to person, place, and time.  Skin: Skin is warm and dry. She is not diaphoretic.  Psychiatric: She has a normal mood and affect.    Labs reviewed: Basic Metabolic Panel:  Recent Labs  10/03/13 0828 04/05/14 0842  NA 141 138  K 4.7 4.2  CL 105 102  CO2 19 21  GLUCOSE 97 105*  BUN 19 16  CREATININE 1.25* 1.13*  CALCIUM 9.6 9.7  TSH  --  1.010   Liver Function Tests:  Recent Labs  04/05/14 0842  AST 18  ALT 12  ALKPHOS 88  BILITOT 0.2  PROT 6.9   No results for input(s): LIPASE, AMYLASE in the last 8760 hours. No results for input(s): AMMONIA in the last  8760 hours. CBC:  Recent Labs  04/05/14 0842  WBC 8.5  NEUTROABS 4.2  HGB 11.7  HCT 36.1  MCV 89   Lipid Panel:  Recent Labs  04/05/14 0842  CHOL 206*  HDL 142  LDLCALC 49  TRIG 75  CHOLHDL 1.5   TSH:  Recent Labs  04/05/14 0842  TSH 1.010   A1C: Lab Results  Component Value Date   HGBA1C 6.3* 04/05/2014     Assessment/Plan  1. Urinary tract infection without hematuria, site unspecified -increase water intake -cont cranberry - ciprofloxacin (CIPRO) 250 MG tablet; Take 1 tablet (250 mg total) by mouth 2 (two) times daily.  Dispense: 6 tablet; Refill: 0 - Urine culture for sensitivity  -follow up precautions discussed

## 2014-04-23 NOTE — Patient Instructions (Signed)
Increase hydration with increase water intake CIPRO (antibiotic) has been sent to your pharmacy for you to take twice daily for 3 days  Will send urine for culture to ensure we are treated it with the correct antibiotic   Urinary Tract Infection Urinary tract infections (UTIs) can develop anywhere along your urinary tract. Your urinary tract is your body's drainage system for removing wastes and extra water. Your urinary tract includes two kidneys, two ureters, a bladder, and a urethra. Your kidneys are a pair of bean-shaped organs. Each kidney is about the size of your fist. They are located below your ribs, one on each side of your spine. CAUSES Infections are caused by microbes, which are microscopic organisms, including fungi, viruses, and bacteria. These organisms are so small that they can only be seen through a microscope. Bacteria are the microbes that most commonly cause UTIs. SYMPTOMS  Symptoms of UTIs may vary by age and gender of the patient and by the location of the infection. Symptoms in young women typically include a frequent and intense urge to urinate and a painful, burning feeling in the bladder or urethra during urination. Older women and men are more likely to be tired, shaky, and weak and have muscle aches and abdominal pain. A fever may mean the infection is in your kidneys. Other symptoms of a kidney infection include pain in your back or sides below the ribs, nausea, and vomiting. DIAGNOSIS To diagnose a UTI, your caregiver will ask you about your symptoms. Your caregiver also will ask to provide a urine sample. The urine sample will be tested for bacteria and white blood cells. White blood cells are made by your body to help fight infection. TREATMENT  Typically, UTIs can be treated with medication. Because most UTIs are caused by a bacterial infection, they usually can be treated with the use of antibiotics. The choice of antibiotic and length of treatment depend on your  symptoms and the type of bacteria causing your infection. HOME CARE INSTRUCTIONS  If you were prescribed antibiotics, take them exactly as your caregiver instructs you. Finish the medication even if you feel better after you have only taken some of the medication.  Drink enough water and fluids to keep your urine clear or pale yellow.  Avoid caffeine, tea, and carbonated beverages. They tend to irritate your bladder.  Empty your bladder often. Avoid holding urine for long periods of time.  Empty your bladder before and after sexual intercourse.  After a bowel movement, women should cleanse from front to back. Use each tissue only once. SEEK MEDICAL CARE IF:   You have back pain.  You develop a fever.  Your symptoms do not begin to resolve within 3 days. SEEK IMMEDIATE MEDICAL CARE IF:   You have severe back pain or lower abdominal pain.  You develop chills.  You have nausea or vomiting.  You have continued burning or discomfort with urination. MAKE SURE YOU:   Understand these instructions.  Will watch your condition.  Will get help right away if you are not doing well or get worse. Document Released: 11/12/2004 Document Revised: 08/04/2011 Document Reviewed: 03/13/2011 [facility] Patient Information 2015 [place], [place]. This information is not intended to replace advice given to you by your health care provider. Make sure you discuss any questions you have with your health care provider.

## 2014-04-25 LAB — URINE CULTURE

## 2014-04-27 ENCOUNTER — Other Ambulatory Visit: Payer: Self-pay | Admitting: Nurse Practitioner

## 2014-04-27 MED ORDER — CIPROFLOXACIN HCL 250 MG PO TABS: 250.0000 mg | ORAL_TABLET | Freq: Two times a day (BID) | ORAL | Status: DC

## 2014-04-30 ENCOUNTER — Encounter: Payer: Self-pay | Admitting: Nurse Practitioner

## 2014-04-30 ENCOUNTER — Ambulatory Visit (INDEPENDENT_AMBULATORY_CARE_PROVIDER_SITE_OTHER): Payer: Medicare Other | Admitting: Nurse Practitioner

## 2014-04-30 VITALS — BP 122/64 | HR 79 | Temp 98.2°F | Resp 20 | Ht 64.0 in | Wt 108.2 lb

## 2014-04-30 DIAGNOSIS — N390 Urinary tract infection, site not specified: Secondary | ICD-10-CM

## 2014-04-30 MED ORDER — CIPROFLOXACIN HCL 250 MG PO TABS: 250.0000 mg | ORAL_TABLET | Freq: Two times a day (BID) | ORAL | Status: DC

## 2014-04-30 NOTE — Progress Notes (Signed)
Patient ID: [name], female   DOB: Aug 01, 1935, 79 y.o.   MRN: [id number]    PCP: Hollace Kinnier, DO  No Known Allergies  Chief Complaint  Patient presents with  . Acute Visit    Patient with ongoing urinary symptoms (abdominal pressure and frequency)     HPI: Patient is a 79 y.o. female seen in the office today to follow up urinary complaints. Pt competed cipro 250 mg twice daily for 3 days. Reports symptoms improved but now they are back. Dysuria with abdominal pressure, frequency. No fevers or chills. No back pains. Other than urinary complaints feel well. Review of Systems:  Review of Systems  Constitutional: Negative for activity change, appetite change, fatigue and unexpected weight change.  HENT: Negative for congestion and hearing loss.   Eyes: Negative.   Respiratory: Negative for shortness of breath.   Cardiovascular: Negative for chest pain, palpitations and leg swelling.  Gastrointestinal: Positive for abdominal pain. Negative for diarrhea and constipation.  Genitourinary: Positive for dysuria, frequency and difficulty urinating. Negative for hematuria and flank pain.    Past Medical History  Diagnosis Date  . Type II or unspecified type diabetes mellitus without mention of complication, uncontrolled   . Hypertension   . Anxiety   . Hyperlipidemia   . Herpes simplex   . Goiter   . Renal cyst   . PUD (peptic ulcer disease)   . Endometriosis   . Unspecified disorder of kidney and ureter   . Sciatica   . Scoliosis (and kyphoscoliosis), idiopathic   . Loss of weight   . Urinary frequency   . Other abnormal blood chemistry   . Cholelithiasis   . Peptic ulcer   . Nontoxic multinodular goiter    Past Surgical History  Procedure Laterality Date  . Abdominal hysterectomy  1971  . Repair of perforated ulcer  1973 &1975    Dr Vida Rigger  . Stomach surgery  1977    removed 1/2 stomach   Social History:   reports that she quit smoking about 20 years ago. She has  never used smokeless tobacco. She reports that she does not drink alcohol or use illicit drugs.  Family History  Problem Relation Age of Onset  . Obesity Mother     Medications: Patient's Medications  New Prescriptions   No medications on file  Previous Medications   ALPRAZOLAM (XANAX) 0.25 MG TABLET    Take 1/2 tablet by mouth once daily as needed for anxiety   BYSTOLIC 10 MG TABLET    Take 10 mg by mouth daily. 1 by mouth daily   CIPROFLOXACIN (CIPRO) 250 MG TABLET    Take 1 tablet (250 mg total) by mouth 2 (two) times daily.   EXFORGE 10-320 MG PER TABLET    take 1 tablet by mouth once daily   PROBIOTIC PRODUCT (PROBIOTIC DAILY PO)    Take by mouth. Take one tablet by mouth once daily for stomach  Modified Medications   No medications on file  Discontinued Medications   No medications on file     Physical Exam:  Filed Vitals:   04/30/14 1301  BP: 122/64  Pulse: 79  Temp: 98.2 F (36.8 C)  TempSrc: Oral  Resp: 20  Height: 5\' 4"  (1.626 m)  Weight: 108 lb 3.2 oz (49.079 kg)  SpO2: 99%    Physical Exam  Constitutional: She is oriented to person, place, and time. She appears well-developed and well-nourished. No distress.  Cardiovascular: Normal rate, regular rhythm  and normal heart sounds.   Pulmonary/Chest: Effort normal and breath sounds normal.  Abdominal: Soft. Bowel sounds are normal. She exhibits no distension. There is no tenderness. There is no rebound and no guarding.  Neurological: She is alert and oriented to person, place, and time.  Skin: Skin is warm and dry. She is not diaphoretic.  Psychiatric: She has a normal mood and affect.    Labs reviewed: Basic Metabolic Panel:  Recent Labs  10/03/13 0828 04/05/14 0842  NA 141 138  K 4.7 4.2  CL 105 102  CO2 19 21  GLUCOSE 97 105*  BUN 19 16  CREATININE 1.25* 1.13*  CALCIUM 9.6 9.7  TSH  --  1.010   Liver Function Tests:  Recent Labs  04/05/14 0842  AST 18  ALT 12  ALKPHOS 88  BILITOT 0.2   PROT 6.9   No results for input(s): LIPASE, AMYLASE in the last 8760 hours. No results for input(s): AMMONIA in the last 8760 hours. CBC:  Recent Labs  04/05/14 0842  WBC 8.5  NEUTROABS 4.2  HGB 11.7  HCT 36.1  MCV 89   Lipid Panel:  Recent Labs  04/05/14 0842  CHOL 206*  HDL 142  LDLCALC 49  TRIG 75  CHOLHDL 1.5   TSH:  Recent Labs  04/05/14 0842  TSH 1.010   A1C: Lab Results  Component Value Date   HGBA1C 6.3* 04/05/2014     Assessment/Plan  1. Urinary tract infection without hematuria, site unspecified -discussed results of UA C&S, still with symptoms -when culture report came through Cipro was extended but pt never picked up prescription. Rx sent for Cipro 250 mg BID for 7 days -to cont florastor twice daily with cipro -increase fluid intake -to follow up if symptoms do not improve with antibiotic use or fever occurs while on antibiotic needs to seek immediate attention.

## 2014-04-30 NOTE — Patient Instructions (Signed)
New prescription for 7 days supply of Cipro has been sent to the pharmacy. Start this today Increase hydration to help with symptoms

## 2014-05-14 ENCOUNTER — Ambulatory Visit (INDEPENDENT_AMBULATORY_CARE_PROVIDER_SITE_OTHER): Payer: Medicare Other | Admitting: Nurse Practitioner

## 2014-05-14 ENCOUNTER — Other Ambulatory Visit: Payer: Self-pay | Admitting: Internal Medicine

## 2014-05-14 ENCOUNTER — Encounter: Payer: Self-pay | Admitting: Nurse Practitioner

## 2014-05-14 VITALS — BP 120/70 | HR 77 | Temp 97.8°F | Resp 18 | Ht 64.0 in | Wt 108.2 lb

## 2014-05-14 DIAGNOSIS — R350 Frequency of micturition: Secondary | ICD-10-CM | POA: Diagnosis not present

## 2014-05-14 DIAGNOSIS — B3731 Acute candidiasis of vulva and vagina: Secondary | ICD-10-CM

## 2014-05-14 DIAGNOSIS — B373 Candidiasis of vulva and vagina: Secondary | ICD-10-CM

## 2014-05-14 LAB — POCT URINALYSIS DIPSTICK
Bilirubin (Urine): NEGATIVE
Blood, UA: NEGATIVE
GLUCOSE UA: NEGATIVE
Ketones, UA: NEGATIVE
Leukocytes, UA: NEGATIVE
NITRITE UA: NEGATIVE
Protein, UA: NEGATIVE
Spec Grav, UA: <=1.005
Urobilinogen, UA: 0.2
pH, UA: 5

## 2014-05-14 MED ORDER — FLUCONAZOLE 150 MG PO TABS: ORAL_TABLET | ORAL | Status: DC

## 2014-05-14 NOTE — Progress Notes (Signed)
Patient ID: [name], female   DOB: Sep 02, 1935, 79 y.o.   MRN: [id number]    PCP: Hollace Kinnier, DO  No Known Allergies  Chief Complaint  Patient presents with  . Acute Visit    UTI      HPI: Patient is a 79 y.o. female seen in the office today, Recent antibiotic use with UTI.  Overall bladder symptoms have improved since UTI. No burning when she urinates. No abdominal pain or pressure. Increased frequency at night-- drinking a lot of fluid Having discomfort in the vaginal area, worse when she wipes. Reports itching. Not able to wear underwear because it is uncomfortable.  Review of Systems:  Review of Systems  Constitutional: Negative for fever, chills, fatigue and unexpected weight change.  Respiratory: Negative for shortness of breath.   Cardiovascular: Negative for chest pain.  Gastrointestinal: Negative for diarrhea, constipation and abdominal distention.  Genitourinary: Positive for frequency and vaginal pain. Negative for dysuria, urgency, hematuria, difficulty urinating, genital sores, pelvic pain and dyspareunia.    Past Medical History  Diagnosis Date  . Type II or unspecified type diabetes mellitus without mention of complication, uncontrolled   . Hypertension   . Anxiety   . Hyperlipidemia   . Herpes simplex   . Goiter   . Renal cyst   . PUD (peptic ulcer disease)   . Endometriosis   . Unspecified disorder of kidney and ureter   . Sciatica   . Scoliosis (and kyphoscoliosis), idiopathic   . Loss of weight   . Urinary frequency   . Other abnormal blood chemistry   . Cholelithiasis   . Peptic ulcer   . Nontoxic multinodular goiter    Past Surgical History  Procedure Laterality Date  . Abdominal hysterectomy  1971  . Repair of perforated ulcer  1973 &1975    Dr Vida Rigger  . Stomach surgery  1977    removed 1/2 stomach   Social History:   reports that she quit smoking about 20 years ago. She has never used smokeless tobacco. She reports that she does not  drink alcohol or use illicit drugs.  Family History  Problem Relation Age of Onset  . Obesity Mother     Medications: Patient's Medications  New Prescriptions   No medications on file  Previous Medications   ALPRAZOLAM (XANAX) 0.25 MG TABLET    Take 1/2 tablet by mouth once daily as needed for anxiety   BYSTOLIC 10 MG TABLET    Take 10 mg by mouth daily. 1 by mouth daily   EXFORGE 10-320 MG PER TABLET    take 1 tablet by mouth once daily   PROBIOTIC PRODUCT (PROBIOTIC DAILY PO)    Take by mouth. Take one tablet by mouth once daily for stomach  Modified Medications   No medications on file  Discontinued Medications   CIPROFLOXACIN (CIPRO) 250 MG TABLET    Take 1 tablet (250 mg total) by mouth 2 (two) times daily.     Physical Exam:  Filed Vitals:   05/14/14 1526  BP: 120/70  Pulse: 77  Temp: 97.8 F (36.6 C)  TempSrc: Oral  Resp: 18  Height: 5\' 4"  (1.626 m)  Weight: 108 lb 3.2 oz (49.079 kg)  SpO2: 95%    Physical Exam  Constitutional: She is oriented to person, place, and time. She appears well-developed and well-nourished.  Cardiovascular: Normal rate, regular rhythm and normal heart sounds.   Pulmonary/Chest: Effort normal and breath sounds normal.  Genitourinary: Vagina normal.  Redness and mild swelling to labia, yeast present  Neurological: She is alert and oriented to person, place, and time.  Skin: Skin is warm and dry.    Labs reviewed: Basic Metabolic Panel:  Recent Labs  10/03/13 0828 04/05/14 0842  NA 141 138  K 4.7 4.2  CL 105 102  CO2 19 21  GLUCOSE 97 105*  BUN 19 16  CREATININE 1.25* 1.13*  CALCIUM 9.6 9.7  TSH  --  1.010   Liver Function Tests:  Recent Labs  04/05/14 0842  AST 18  ALT 12  ALKPHOS 88  BILITOT 0.2  PROT 6.9   No results for input(s): LIPASE, AMYLASE in the last 8760 hours. No results for input(s): AMMONIA in the last 8760 hours. CBC:  Recent Labs  04/05/14 0842  WBC 8.5  NEUTROABS 4.2  HGB 11.7  HCT  36.1  MCV 89   Lipid Panel:  Recent Labs  04/05/14 0842  CHOL 206*  HDL 142  LDLCALC 49  TRIG 75  CHOLHDL 1.5   TSH:  Recent Labs  04/05/14 0842  TSH 1.010   A1C: Lab Results  Component Value Date   HGBA1C 6.3* 04/05/2014     Assessment/Plan 1. Urinary frequency - POC Urinalysis Dipstick normal -to decrease fluids after dinner to prevent getting up frequently at night  2. Candidiasis of genitalia in female -Candidiasis after antibiotic use for UTI - fluconazole (DIFLUCAN) 150 MG tablet; 1 tablet today, may repeat in 3 days if symptoms have not resolved  Dispense: 2 tablet; Refill: 0  Follow up as needed

## 2014-05-14 NOTE — Patient Instructions (Signed)
Diflucan 150 mg by mouth today, may repeat in 3 days if symptoms have not resolved  Cut back on fluid after dinner to decrease urination at night  Candida Infection A Candida infection (also called yeast, fungus, and Monilia infection) is an overgrowth of yeast that can occur anywhere on the body. A yeast infection commonly occurs in warm, moist body areas. Usually, the infection remains localized but can spread to become a systemic infection. A yeast infection may be a sign of a more severe disease such as diabetes, leukemia, or AIDS. A yeast infection can occur in both men and women. In women, Candida vaginitis is a vaginal infection. It is one of the most common causes of vaginitis. Men usually do not have symptoms or know they have an infection until other problems develop. Men may find out they have a yeast infection because their sex partner has a yeast infection. Uncircumcised men are more likely to get a yeast infection than circumcised men. This is because the uncircumcised glans is not exposed to air and does not remain as dry as that of a circumcised glans. Older adults may develop yeast infections around dentures. CAUSES  Women  Antibiotics.  Steroid medication taken for a long time.  Being overweight (obese).  Diabetes.  Poor immune condition.  Certain serious medical conditions.  Immune suppressive medications for organ transplant patients.  Chemotherapy.  Pregnancy.  Menstruation.  Stress and fatigue.  Intravenous drug use.  Oral contraceptives.  Wearing tight-fitting clothes in the crotch area.  Catching it from a sex partner who has a yeast infection.  Spermicide.  Intravenous, urinary, or other catheters. Men  Catching it from a sex partner who has a yeast infection.  Having oral or anal sex with a person who has the infection.  Spermicide.  Diabetes.  Antibiotics.  Poor immune system.  Medications that suppress the immune  system.  Intravenous drug use.  Intravenous, urinary, or other catheters. SYMPTOMS  Women  Thick, white vaginal discharge.  Vaginal itching.  Redness and swelling in and around the vagina.  Irritation of the lips of the vagina and perineum.  Blisters on the vaginal lips and perineum.  Painful sexual intercourse.  Low blood sugar (hypoglycemia).  Painful urination.  Bladder infections.  Intestinal problems such as constipation, indigestion, bad breath, bloating, increase in gas, diarrhea, or loose stools. Men  Men may develop intestinal problems such as constipation, indigestion, bad breath, bloating, increase in gas, diarrhea, or loose stools.  Dry, cracked skin on the penis with itching or discomfort.  Jock itch.  Dry, flaky skin.  Athlete's foot.  Hypoglycemia. DIAGNOSIS  Women  A history and an exam are performed.  The discharge may be examined under a microscope.  A culture may be taken of the discharge. Men  A history and an exam are performed.  Any discharge from the penis or areas of cracked skin will be looked at under the microscope and cultured.  Stool samples may be cultured. TREATMENT  Women  Vaginal antifungal suppositories and creams.  Medicated creams to decrease irritation and itching on the outside of the vagina.  Warm compresses to the perineal area to decrease swelling and discomfort.  Oral antifungal medications.  Medicated vaginal suppositories or cream for repeated or recurrent infections.  Wash and dry the irritation areas before applying the cream.  Eating yogurt with Lactobacillus may help with prevention and treatment.  Sometimes painting the vagina with gentian violet solution may help if creams and suppositories do  not work. Men  Antifungal creams and oral antifungal medications.  Sometimes treatment must continue for 30 days after the symptoms go away to prevent recurrence. HOME CARE INSTRUCTIONS  Women  Use  cotton underwear and avoid tight-fitting clothing.  Avoid colored, scented toilet paper and deodorant tampons or pads.  Do not douche.  Keep your diabetes under control.  Finish all the prescribed medications.  Keep your skin clean and dry.  Consume milk or yogurt with Lactobacillus-active culture regularly. If you get frequent yeast infections and think that is what the infection is, there are over-the-counter medications that you can get. If the infection does not show healing in 3 days, talk to your caregiver.  Tell your sex partner you have a yeast infection. Your partner may need treatment also, especially if your infection does not clear up or recurs. Men  Keep your skin clean and dry.  Keep your diabetes under control.  Finish all prescribed medications.  Tell your sex partner that you have a yeast infection so he or she can be treated if necessary. SEEK MEDICAL CARE IF:   Your symptoms do not clear up or worsen in one week after treatment.  You have an oral temperature above 102 F (38.9 C).  You have trouble swallowing or eating for a prolonged time.  You develop blisters on and around your vagina.  You develop vaginal bleeding and it is not your menstrual period.  You develop abdominal pain.  You develop intestinal problems as mentioned above.  You get weak or light-headed.  You have painful or increased urination.  You have pain during sexual intercourse. MAKE SURE YOU:   Understand these instructions.  Will watch your condition.  Will get help right away if you are not doing well or get worse. Document Released: 03/12/2004 Document Revised: 06/19/2013 Document Reviewed: 06/24/2009 [facility] Patient Information 2015 [place], [place]. This information is not intended to replace advice given to you by your health care provider. Make sure you discuss any questions you have with your health care provider.

## 2014-06-04 ENCOUNTER — Other Ambulatory Visit: Payer: Self-pay | Admitting: *Deleted

## 2014-06-04 MED ORDER — ALPRAZOLAM 0.25 MG PO TABS: ORAL_TABLET | ORAL | Status: DC

## 2014-08-24 ENCOUNTER — Other Ambulatory Visit: Payer: Self-pay | Admitting: *Deleted

## 2014-08-24 MED ORDER — AMLODIPINE BESYLATE-VALSARTAN 10-320 MG PO TABS: 1.0000 | ORAL_TABLET | Freq: Every day | ORAL | Status: DC

## 2014-10-02 ENCOUNTER — Other Ambulatory Visit: Payer: Medicare Other

## 2014-10-04 ENCOUNTER — Encounter: Payer: Self-pay | Admitting: Internal Medicine

## 2014-10-04 ENCOUNTER — Ambulatory Visit (INDEPENDENT_AMBULATORY_CARE_PROVIDER_SITE_OTHER): Payer: Medicare Other | Admitting: Internal Medicine

## 2014-10-04 VITALS — BP 122/68 | HR 72 | Temp 98.4°F | Resp 12 | Ht 64.0 in | Wt 107.0 lb

## 2014-10-04 DIAGNOSIS — I10 Essential (primary) hypertension: Secondary | ICD-10-CM

## 2014-10-04 DIAGNOSIS — B351 Tinea unguium: Secondary | ICD-10-CM | POA: Diagnosis not present

## 2014-10-04 DIAGNOSIS — R739 Hyperglycemia, unspecified: Secondary | ICD-10-CM

## 2014-10-04 NOTE — Progress Notes (Signed)
Patient ID: [name], female   DOB: 12/05/1935, 79 y.o.   MRN: [id number]   Location:  [facility]  Code Status: DNR Goals of Care: Advanced Directive information Does patient have an advance directive?: No   Chief Complaint  Patient presents with  . Medical Management of Chronic Issues    6 month follow-up, no recent labs     HPI: Patient is a 79 y.o. white female seen in the office today for medical mgt of chronic diseases.    She is not getting any screening we recommend completed.  She postponed them for several years.    Only concern she has is toenail fungus on one toe third on right foot.    BP is well controlled--taking brand name exforge and is taking that.  Says the generic was a horsepill compared to the brand.  Takes a while to get each time.  Also on bystolic.  No concerns for dizziness.    Needed longer course of abx she says to get rid of the UTI.  Then yeast infection treatment taken also--says b/c yeast came up in results.    Review of Systems:  Review of Systems  Constitutional: Positive for weight loss. Negative for fever, chills and malaise/fatigue.  HENT: Negative for congestion.   Eyes: Negative for blurred vision.       Glasses  Respiratory: Negative for shortness of breath.   Cardiovascular: Negative for chest pain and leg swelling.  Gastrointestinal: Negative for abdominal pain, constipation, blood in stool and melena.  Genitourinary: Negative for dysuria, urgency and frequency.  Musculoskeletal: Negative for myalgias and falls.  Skin: Negative for itching and rash.       Toenail fungus  Neurological: Negative for dizziness and weakness.  Endo/Heme/Allergies: Does not bruise/bleed easily.  Psychiatric/Behavioral: Negative for depression and memory loss. The patient is not nervous/anxious and does not have insomnia.     Past Medical History  Diagnosis Date  . Type II or unspecified type diabetes mellitus  without mention of complication, uncontrolled   . Hypertension   . Anxiety   . Hyperlipidemia   . Herpes simplex   . Goiter   . Renal cyst   . PUD (peptic ulcer disease)   . Endometriosis   . Unspecified disorder of kidney and ureter   . Sciatica   . Scoliosis (and kyphoscoliosis), idiopathic   . Loss of weight   . Urinary frequency   . Other abnormal blood chemistry   . Cholelithiasis   . Peptic ulcer   . Nontoxic multinodular goiter     Past Surgical History  Procedure Laterality Date  . Abdominal hysterectomy  1971  . Repair of perforated ulcer  1973 &1975    Dr Vida Rigger  . Stomach surgery  1977    removed 1/2 stomach    No Known Allergies Medications: Patient's Medications  New Prescriptions   No medications on file  Previous Medications   ALPRAZOLAM (XANAX) 0.25 MG TABLET    Take 1/2 tablet by mouth once daily as needed for anxiety   BYSTOLIC 10 MG TABLET    take 1/2 tablet by mouth once daily for blood pressure   PROBIOTIC PRODUCT (PROBIOTIC DAILY PO)    Take by mouth. Take one tablet by mouth once daily for stomach  Modified Medications   Modified Medication Previous Medication   AMLODIPINE-VALSARTAN (EXFORGE) 10-320 MG PER TABLET amLODipine-valsartan (EXFORGE) 10-320 MG per tablet      Take  1 tablet by mouth daily. Brand Name ONLY    Take 1 tablet by mouth daily.  Discontinued Medications   BYSTOLIC 10 MG TABLET    Take 10 mg by mouth daily. 1 by mouth daily   FLUCONAZOLE (DIFLUCAN) 150 MG TABLET    1 tablet today, may repeat in 3 days if symptoms have not resolved    Physical Exam: Filed Vitals:   10/04/14 1429  BP: 122/68  Pulse: 72  Temp: 98.4 F (36.9 C)  TempSrc: Oral  Resp: 12  Height: 5\' 4"  (1.626 m)  Weight: 107 lb (48.535 kg)   Physical Exam  Constitutional: She is oriented to person, place, and time. No distress.  Getting thinner, but refuses investigations and labs unremarkable  Cardiovascular: Normal rate, regular rhythm, normal heart  sounds and intact distal pulses.   Pulmonary/Chest: Effort normal and breath sounds normal. No respiratory distress.  Abdominal: Soft. Bowel sounds are normal.  Musculoskeletal: Normal range of motion.  Neurological: She is alert and oriented to person, place, and time.  Skin: Skin is warm and dry.    Labs reviewed: Basic Metabolic Panel:  Recent Labs  04/05/14 0842  NA 138  K 4.2  CL 102  CO2 21  GLUCOSE 105*  BUN 16  CREATININE 1.13*  CALCIUM 9.7  TSH 1.010   Liver Function Tests:  Recent Labs  04/05/14 0842  AST 18  ALT 12  ALKPHOS 88  BILITOT 0.2  PROT 6.9   No results for input(s): LIPASE, AMYLASE in the last 8760 hours. No results for input(s): AMMONIA in the last 8760 hours. CBC:  Recent Labs  04/05/14 0842  WBC 8.5  NEUTROABS 4.2  HGB 11.7  HCT 36.1  MCV 89   Lipid Panel:  Recent Labs  04/05/14 0842  CHOL 206*  HDL 142  LDLCALC 49  TRIG 75  CHOLHDL 1.5   Lab Results  Component Value Date   HGBA1C 6.3* 04/05/2014    Assessment/Plan 1. Onychomycosis -advised to use otc antifungal polish--may take mos to years to grow out  2. Essential hypertension -cont exforge for this, well controlled--f/u labs - CBC with Differential/Platelet - Basic metabolic panel - CBC with Differential; Future - CBC with Differential  3. Hyperglycemia -suspect this is cause of weight loss--records show she actually had diabetes diagnosis at one time in the past so check for proteinuria - Microalbumin/Creatinine Ratio, Urine - Hemoglobin A1c  Labs/tests ordered: Orders Placed This Encounter  Procedures  . Microalbumin/Creatinine Ratio, Urine  . CBC with Differential    Standing Status: Future     Number of Occurrences: 1     Standing Expiration Date: 10/04/2015    Next appt:  6 mos for annual exam, labs before  Dalayla Aldredge L. Reyne Falconi, D.O. [facility] [address], [place] [address] Cell Phone  (Mon-Fri 8am-5pm):  [phone] On Call:  [phone] & follow prompts after 5pm & weekends Office Phone:  [phone] Office Fax:  [phone]

## 2014-10-04 NOTE — Patient Instructions (Addendum)
Reminder: Bring copy of [place] (Advanced Directive and Living Will) to next appointment.  Antifungal polish for third toe on right.  This is over the counter.

## 2014-10-05 LAB — CBC WITH DIFFERENTIAL/PLATELET
Basophils Absolute: 0 10*3/uL (ref 0.0–0.2)
Basos: 0 %
EOS (ABSOLUTE): 0.1 10*3/uL (ref 0.0–0.4)
Eos: 1 %
Hematocrit: 34.4 % (ref 34.0–46.6)
Hemoglobin: 10.9 g/dL — ABNORMAL LOW (ref 11.1–15.9)
Immature Grans (Abs): 0 10*3/uL (ref 0.0–0.1)
Immature Granulocytes: 0 %
Lymphocytes Absolute: 2.2 10*3/uL (ref 0.7–3.1)
Lymphs: 25 %
MCH: 27.9 pg (ref 26.6–33.0)
MCHC: 31.7 g/dL (ref 31.5–35.7)
MCV: 88 fL (ref 79–97)
Monocytes Absolute: 0.6 10*3/uL (ref 0.1–0.9)
Monocytes: 6 %
Neutrophils Absolute: 6.2 10*3/uL (ref 1.4–7.0)
Neutrophils: 68 %
Platelets: 326 10*3/uL (ref 150–379)
RBC: 3.91 x10E6/uL (ref 3.77–5.28)
RDW: 14.3 % (ref 12.3–15.4)
WBC: 9.1 10*3/uL (ref 3.4–10.8)

## 2014-10-05 LAB — BASIC METABOLIC PANEL
BUN/Creatinine Ratio: 21 (ref 11–26)
BUN: 25 mg/dL (ref 8–27)
CO2: 21 mmol/L (ref 18–29)
Calcium: 9.6 mg/dL (ref 8.7–10.3)
Chloride: 102 mmol/L (ref 97–108)
Creatinine, Ser: 1.18 mg/dL — ABNORMAL HIGH (ref 0.57–1.00)
GFR calc Af Amer: 52 mL/min/{1.73_m2} — ABNORMAL LOW (ref >59)
GFR calc non Af Amer: 45 mL/min/{1.73_m2} — ABNORMAL LOW (ref >59)
Glucose: 111 mg/dL — ABNORMAL HIGH (ref 65–99)
Potassium: 4.6 mmol/L (ref 3.5–5.2)
Sodium: 140 mmol/L (ref 134–144)

## 2014-10-05 LAB — HEMOGLOBIN A1C
Est. average glucose Bld gHb Est-mCnc: 140 mg/dL
Hgb A1c MFr Bld: 6.5 % — ABNORMAL HIGH (ref 4.8–5.6)

## 2014-10-05 LAB — MICROALBUMIN / CREATININE URINE RATIO
Creatinine, Urine: 139.8 mg/dL
MICROALB/CREAT RATIO: 19.5 mg/g creat (ref 0.0–30.0)
Microalbumin, Urine: 27.2 ug/mL

## 2014-10-08 ENCOUNTER — Telehealth: Payer: Self-pay

## 2014-10-08 DIAGNOSIS — R634 Abnormal weight loss: Secondary | ICD-10-CM

## 2014-10-08 DIAGNOSIS — D649 Anemia, unspecified: Secondary | ICD-10-CM

## 2014-10-08 NOTE — Telephone Encounter (Signed)
Spoke with patient about lab results, will pick up hemoccult test.

## 2014-10-27 DIAGNOSIS — B373 Candidiasis of vulva and vagina: Secondary | ICD-10-CM | POA: Diagnosis not present

## 2014-10-27 DIAGNOSIS — B009 Herpesviral infection, unspecified: Secondary | ICD-10-CM | POA: Diagnosis not present

## 2014-10-29 ENCOUNTER — Ambulatory Visit (INDEPENDENT_AMBULATORY_CARE_PROVIDER_SITE_OTHER): Payer: Medicare Other | Admitting: Internal Medicine

## 2014-10-29 ENCOUNTER — Encounter: Payer: Self-pay | Admitting: Internal Medicine

## 2014-10-29 VITALS — BP 124/80 | HR 78 | Temp 97.6°F | Ht 64.0 in | Wt 104.4 lb

## 2014-10-29 DIAGNOSIS — A6000 Herpesviral infection of urogenital system, unspecified: Secondary | ICD-10-CM

## 2014-10-29 MED ORDER — ACYCLOVIR 5 % EX OINT: 1.0000 "application " | TOPICAL_OINTMENT | CUTANEOUS | Status: DC | PRN

## 2014-10-29 NOTE — Progress Notes (Signed)
Patient ID: [name], female   DOB: [birth year], 79 y.o.   MRN: [id number]   Location:  [facility]  Code Status: DNR Goals of Care: Advanced Directive information Does patient have an advance directive?: No, Would patient like information on creating an advanced directive?: Yes - Educational materials given (Given several visit's ago )   Chief Complaint  Patient presents with  . Acute Visit    Rash between legs, seen at [facility] on Saturday (rx'ed valtrex and diflucan). Patient with rash since Thursday. Rash is no better since OV at urgent care. Patient also c/o hemorrhoids. Patient with pain when urinating due to irritation from rash     HPI: Patient is a 79 y.o. white female seen in the office today for an acute visit due to herpes ulcers, surrounding painful rash and swelling and dysuria.   Felt similar to the herpes she had 10 years ago.  Says they barely looked at her at urgent care.  Review of Systems:  Review of Systems  Constitutional: Positive for chills. Negative for fever and malaise/fatigue.  Genitourinary: Positive for dysuria.       Severe pain and swelling of labia  Skin: Positive for rash. Negative for itching.       pain  Neurological: Negative for weakness.    Past Medical History  Diagnosis Date  . Type II or unspecified type diabetes mellitus without mention of complication, uncontrolled   . Hypertension   . Anxiety   . Hyperlipidemia   . Herpes simplex   . Goiter   . Renal cyst   . PUD (peptic ulcer disease)   . Endometriosis   . Unspecified disorder of kidney and ureter   . Sciatica   . Scoliosis (and kyphoscoliosis), idiopathic   . Loss of weight   . Urinary frequency   . Other abnormal blood chemistry   . Cholelithiasis   . Peptic ulcer   . Nontoxic multinodular goiter   . Anemia     Past Surgical History  Procedure Laterality Date  . Abdominal hysterectomy  1971  . Repair of perforated ulcer  1973 &1975     Dr Vida Rigger  . Stomach surgery  1977    removed 1/2 stomach    No Known Allergies Medications: Patient's Medications  New Prescriptions   No medications on file  Previous Medications   ALPRAZOLAM (XANAX) 0.25 MG TABLET    Take 1/2 tablet by mouth once daily as needed for anxiety   AMLODIPINE-VALSARTAN (EXFORGE) 10-320 MG PER TABLET    Take 1 tablet by mouth daily. Brand Name ONLY   BYSTOLIC 10 MG TABLET    take 1/2 tablet by mouth once daily for blood pressure   PROBIOTIC PRODUCT (PROBIOTIC DAILY PO)    Take by mouth. Take one tablet by mouth once daily for stomach   VALACYCLOVIR (VALTREX) 500 MG TABLET    Take 500 mg by mouth 2 (two) times daily. X 3 days  Modified Medications   No medications on file  Discontinued Medications   No medications on file    Physical Exam: Filed Vitals:   10/29/14 1147  BP: 124/80  Pulse: 78  Temp: 97.6 F (36.4 C)  TempSrc: Oral  Height: 5\' 4"  (1.626 m)  Weight: 104 lb 6.4 oz (47.356 kg)  SpO2: 97%   Physical Exam  Constitutional: She is oriented to person, place, and time.  Thin white female  Cardiovascular: Normal rate and regular rhythm.  Pulmonary/Chest: Effort normal and breath sounds normal.  Genitourinary: Pelvic exam was performed with patient supine. There is rash, tenderness and lesion on the right labia. There is no injury on the right labia. There is rash, tenderness and lesion on the left labia. There is no injury on the left labia.  Diffusely erythematous and edematous labia minora and majora bilaterally and onto thighs, mons pubis; small ulcerations with clear drainage also present; no vaginal discharge or odor; internal exam with speculum was unremarkable   Musculoskeletal: Normal range of motion.  Neurological: She is alert and oriented to person, place, and time.    Labs reviewed: Basic Metabolic Panel:  Recent Labs  04/05/14 0842 10/04/14 1517  NA 138 140  K 4.2 4.6  CL 102 102  CO2 21 21  GLUCOSE 105* 111*    BUN 16 25  CREATININE 1.13* 1.18*  CALCIUM 9.7 9.6  TSH 1.010  --    Liver Function Tests:  Recent Labs  04/05/14 0842  AST 18  ALT 12  ALKPHOS 88  BILITOT 0.2  PROT 6.9   No results for input(s): LIPASE, AMYLASE in the last 8760 hours. No results for input(s): AMMONIA in the last 8760 hours. CBC:  Recent Labs  04/05/14 0842 10/04/14 1517  WBC 8.5 9.1  NEUTROABS 4.2 6.2  HGB 11.7  --   HCT 36.1 34.4  MCV 89  --    Lipid Panel:  Recent Labs  04/05/14 0842  CHOL 206*  HDL 142  LDLCALC 49  TRIG 75  CHOLHDL 1.5   Lab Results  Component Value Date   HGBA1C 6.5* 10/04/2014    Assessment/Plan 1. Genital herpes simplex type 2 - advised to complete valacyclovir as ordered -does not appear to have a yeast/candidal rash--suspect the erythema is purely from a severe herpes outbreak -add topical treatment as well -cont to hydrate well  - acyclovir ointment (ZOVIRAX) 5 %; Apply 1 application topically every 4 (four) hours as needed. for swelling and pain  Dispense: 30 g; Refill: 1  Note that 25 minutes was spent seeing and performing a thorough genitourinary exam on patient as well as reviewing urgent care records.    Next appt: prn and keep regular med mgt appt  Alcario Tinkey L. Julyana Woolverton, D.O. [facility] [address], [place] [address] Cell Phone (Mon-Fri 8am-5pm):  [phone] On Call:  [phone] & follow prompts after 5pm & weekends Office Phone:  [phone] Office Fax:  [phone]

## 2014-11-03 DIAGNOSIS — A6000 Herpesviral infection of urogenital system, unspecified: Secondary | ICD-10-CM | POA: Insufficient documentation

## 2014-11-06 ENCOUNTER — Ambulatory Visit (INDEPENDENT_AMBULATORY_CARE_PROVIDER_SITE_OTHER): Payer: Medicare Other | Admitting: Nurse Practitioner

## 2014-11-06 ENCOUNTER — Encounter: Payer: Self-pay | Admitting: Nurse Practitioner

## 2014-11-06 VITALS — BP 128/74 | HR 80 | Temp 97.7°F | Resp 16 | Ht 64.0 in | Wt 103.0 lb

## 2014-11-06 DIAGNOSIS — N949 Unspecified condition associated with female genital organs and menstrual cycle: Secondary | ICD-10-CM | POA: Diagnosis not present

## 2014-11-06 DIAGNOSIS — A6000 Herpesviral infection of urogenital system, unspecified: Secondary | ICD-10-CM | POA: Diagnosis not present

## 2014-11-06 LAB — POCT URINALYSIS DIPSTICK
Bilirubin, UA: NEGATIVE
Glucose, UA: NEGATIVE
Ketones, UA: NEGATIVE
Nitrite, UA: NEGATIVE
PH UA: 5
PROTEIN UA: NEGATIVE
RBC UA: NEGATIVE
SPEC GRAV UA: 1.015
UROBILINOGEN UA: 0.2

## 2014-11-06 MED ORDER — PREDNISONE 5 MG PO TABS: ORAL_TABLET | ORAL | Status: DC

## 2014-11-06 NOTE — Patient Instructions (Signed)
Cont monostat- wait 24 hours since last dose  Keep area clean and do not touch or itch-may use monostat to keep area lubricated  Start prednisone 5 mg as directed 20 mg daily for 2 days (4 tablets), then 15 mg daily for 2 days (3 tablets), then 10 mg (2 tablets) for 2 days then 5 mg (1 tablet) for 2 days then stop.   May cont to use ice if providing relief

## 2014-11-06 NOTE — Progress Notes (Signed)
Patient ID: [name], female   DOB: 21-May-1935, 79 y.o.   MRN: [id number]    PCP: Hollace Kinnier, DO  Advanced Directive information Does patient have an advance directive?: No, Would patient like information on creating an advanced directive?: Yes - Educational materials given (Given at last visit )  No Known Allergies  Chief Complaint  Patient presents with  . Acute Visit    Patient with ongoing rash in between legs x  weeks. Patient is in a lot of discomfort. Patient is trying OTC treatments     HPI: Patient is a 79 y.o. female seen in the office today to follow up genital herpes. Was seen by Dr Mariea Clonts on 10/29/14 for acute episode of genital herpes. Pt was on valtrex for 3 days. Given topical acyclovir ointment at visit. Still having a lot of discomfort. Deep itching in her vulva and redness and pain. Itching and heat to inner thighs.  Discomfort when she urinates, not pain More discomfort to the outside of her vaginal  Took monostat yesterday twice and this has helped.   Review of Systems:  Review of Systems  Constitutional: Positive for chills. Negative for fever.  Genitourinary: Positive for dysuria.       Discomfort to the labia   Skin: Positive for rash.       pain  Neurological: Negative for weakness.    Past Medical History  Diagnosis Date  . Type II or unspecified type diabetes mellitus without mention of complication, uncontrolled   . Hypertension   . Anxiety   . Hyperlipidemia   . Herpes simplex   . Goiter   . Renal cyst   . PUD (peptic ulcer disease)   . Endometriosis   . Unspecified disorder of kidney and ureter   . Sciatica   . Scoliosis (and kyphoscoliosis), idiopathic   . Loss of weight   . Urinary frequency   . Other abnormal blood chemistry   . Cholelithiasis   . Peptic ulcer   . Nontoxic multinodular goiter   . Anemia    Past Surgical History  Procedure Laterality Date  . Abdominal hysterectomy  1971  . Repair of perforated ulcer  1973 &1975      Dr Vida Rigger  . Stomach surgery  1977    removed 1/2 stomach   Social History:   reports that she quit smoking about 20 years ago. She has never used smokeless tobacco. She reports that she does not drink alcohol or use illicit drugs.  Family History  Problem Relation Age of Onset  . Obesity Mother     Medications: Patient's Medications  New Prescriptions   No medications on file  Previous Medications   ACYCLOVIR OINTMENT (ZOVIRAX) 5 %    Apply 1 application topically every 4 (four) hours as needed. for swelling and pain   ALPRAZOLAM (XANAX) 0.25 MG TABLET    Take 1/2 tablet by mouth once daily as needed for anxiety   AMLODIPINE-VALSARTAN (EXFORGE) 10-320 MG PER TABLET    Take 1 tablet by mouth daily. Brand Name ONLY   BYSTOLIC 10 MG TABLET    take 1/2 tablet by mouth once daily for blood pressure   PROBIOTIC PRODUCT (PROBIOTIC DAILY PO)    Take by mouth. Take one tablet by mouth once daily for stomach  Modified Medications   No medications on file  Discontinued Medications   VALACYCLOVIR (VALTREX) 500 MG TABLET    Take 500 mg by mouth 2 (two) times daily. X 3  days     Physical Exam:  Filed Vitals:   11/06/14 0904  BP: 128/74  Pulse: 80  Temp: 97.7 F (36.5 C)  TempSrc: Oral  Resp: 16  Height: 5\' 4"  (1.626 m)  Weight: 103 lb (46.72 kg)  SpO2: 97%   Body mass index is 17.67 kg/(m^2).  Physical Exam  Constitutional: She is oriented to person, place, and time.  Thin white female  Cardiovascular: Normal rate and regular rhythm.   Pulmonary/Chest: Effort normal and breath sounds normal.  Genitourinary: Pelvic exam was performed with patient supine. There is rash, tenderness and lesion on the right labia. There is no injury on the right labia. There is rash, tenderness and lesion on the left labia. There is no injury on the left labia.  Skin pink into thighs, mons pubis; no ulcerations, no vaginal discharge or odor, no rash noted  Musculoskeletal: Normal range of  motion.  Neurological: She is alert and oriented to person, place, and time.    Labs reviewed: Basic Metabolic Panel:  Recent Labs  04/05/14 0842 10/04/14 1517  NA 138 140  K 4.2 4.6  CL 102 102  CO2 21 21  GLUCOSE 105* 111*  BUN 16 25  CREATININE 1.13* 1.18*  CALCIUM 9.7 9.6  TSH 1.010  --    Liver Function Tests:  Recent Labs  04/05/14 0842  AST 18  ALT 12  ALKPHOS 88  BILITOT 0.2  PROT 6.9   No results for input(s): LIPASE, AMYLASE in the last 8760 hours. No results for input(s): AMMONIA in the last 8760 hours. CBC:  Recent Labs  04/05/14 0842 10/04/14 1517  WBC 8.5 9.1  NEUTROABS 4.2 6.2  HGB 11.7  --   HCT 36.1 34.4  MCV 89  --    Lipid Panel:  Recent Labs  04/05/14 0842  CHOL 206*  HDL 142  LDLCALC 49  TRIG 75  CHOLHDL 1.5   TSH:  Recent Labs  04/05/14 0842  TSH 1.010   A1C: Lab Results  Component Value Date   HGBA1C 6.5* 10/04/2014     Assessment/Plan 1. Vaginal discomfort Appears to be mostly due to itching, no ulcerations or drainage. No rash noted. Pt placing her hands into her vaginal area and thighs throughout the interview and exam, expect some erythema (pink) due to rubbing area. No bright redness noted.  Labia majora without abnormalities with mild swelling to labia minor. No ulcerations noted to labia.  -may cont to use monostat cream. -to limit touching/itching and keep hand clean -may use ice as needed - POCT urinalysis dipstick negative  -discussed case with Dr Mariea Clonts, who saw pt on the 12th. Will start prednisone taper.  - predniSONE (DELTASONE) 5 MG tablet; Take 20 mg daily for 2 days then decrease by 5 mg every 2 days until complete  Dispense: 20 tablet; Refill: 0  2. Genital Herpes -lesions resolved  Follow up as needed  Eshika Reckart K. Harle Battiest  [facility] [phone] 8 am - 5 pm) [phone] (after hours)

## 2014-11-12 ENCOUNTER — Other Ambulatory Visit: Payer: Self-pay | Admitting: *Deleted

## 2014-11-12 MED ORDER — ALPRAZOLAM 0.25 MG PO TABS: ORAL_TABLET | ORAL | Status: DC

## 2014-11-12 NOTE — Telephone Encounter (Signed)
[facility] 

## 2014-11-19 ENCOUNTER — Other Ambulatory Visit: Payer: Medicare Other

## 2014-11-19 DIAGNOSIS — R634 Abnormal weight loss: Secondary | ICD-10-CM | POA: Diagnosis not present

## 2014-11-19 DIAGNOSIS — D649 Anemia, unspecified: Secondary | ICD-10-CM

## 2014-11-19 LAB — FECAL OCCULT BLOOD, GUAIAC: Fecal Occult Blood: NEGATIVE

## 2014-11-20 LAB — FECAL OCCULT BLOOD, IMMUNOCHEMICAL: Fecal Occult Bld: NEGATIVE

## 2014-12-03 DIAGNOSIS — H2513 Age-related nuclear cataract, bilateral: Secondary | ICD-10-CM | POA: Diagnosis not present

## 2014-12-03 DIAGNOSIS — H353131 Nonexudative age-related macular degeneration, bilateral, early dry stage: Secondary | ICD-10-CM | POA: Diagnosis not present

## 2014-12-03 DIAGNOSIS — H25013 Cortical age-related cataract, bilateral: Secondary | ICD-10-CM | POA: Diagnosis not present

## 2014-12-12 DIAGNOSIS — H25011 Cortical age-related cataract, right eye: Secondary | ICD-10-CM | POA: Diagnosis not present

## 2014-12-12 DIAGNOSIS — H2511 Age-related nuclear cataract, right eye: Secondary | ICD-10-CM | POA: Diagnosis not present

## 2014-12-19 DIAGNOSIS — H25011 Cortical age-related cataract, right eye: Secondary | ICD-10-CM | POA: Diagnosis not present

## 2014-12-19 DIAGNOSIS — H2512 Age-related nuclear cataract, left eye: Secondary | ICD-10-CM | POA: Diagnosis not present

## 2014-12-19 DIAGNOSIS — H25012 Cortical age-related cataract, left eye: Secondary | ICD-10-CM | POA: Diagnosis not present

## 2014-12-19 DIAGNOSIS — H2511 Age-related nuclear cataract, right eye: Secondary | ICD-10-CM | POA: Diagnosis not present

## 2014-12-21 ENCOUNTER — Other Ambulatory Visit: Payer: Self-pay | Admitting: *Deleted

## 2014-12-21 MED ORDER — AMLODIPINE BESYLATE-VALSARTAN 10-320 MG PO TABS: 1.0000 | ORAL_TABLET | Freq: Every day | ORAL | Status: DC

## 2014-12-21 NOTE — Telephone Encounter (Signed)
[facility] 

## 2014-12-26 DIAGNOSIS — H2512 Age-related nuclear cataract, left eye: Secondary | ICD-10-CM | POA: Diagnosis not present

## 2014-12-26 DIAGNOSIS — H25012 Cortical age-related cataract, left eye: Secondary | ICD-10-CM | POA: Diagnosis not present

## 2015-03-19 ENCOUNTER — Other Ambulatory Visit: Payer: Self-pay | Admitting: *Deleted

## 2015-03-19 MED ORDER — ALPRAZOLAM 0.25 MG PO TABS: ORAL_TABLET | ORAL | Status: DC

## 2015-03-19 NOTE — Telephone Encounter (Signed)
[facility] 

## 2015-04-10 ENCOUNTER — Other Ambulatory Visit: Payer: Self-pay | Admitting: *Deleted

## 2015-04-10 DIAGNOSIS — E785 Hyperlipidemia, unspecified: Secondary | ICD-10-CM

## 2015-04-10 DIAGNOSIS — I10 Essential (primary) hypertension: Secondary | ICD-10-CM

## 2015-04-10 DIAGNOSIS — E042 Nontoxic multinodular goiter: Secondary | ICD-10-CM

## 2015-04-10 DIAGNOSIS — R739 Hyperglycemia, unspecified: Secondary | ICD-10-CM

## 2015-04-12 ENCOUNTER — Other Ambulatory Visit: Payer: Medicare Other

## 2015-04-15 ENCOUNTER — Encounter: Payer: Medicare Other | Admitting: Internal Medicine

## 2015-04-22 ENCOUNTER — Other Ambulatory Visit: Payer: Self-pay | Admitting: *Deleted

## 2015-04-22 MED ORDER — AMLODIPINE BESYLATE-VALSARTAN 10-320 MG PO TABS: 1.0000 | ORAL_TABLET | Freq: Every day | ORAL | Status: DC

## 2015-04-22 MED ORDER — NEBIVOLOL HCL 10 MG PO TABS: ORAL_TABLET | ORAL | Status: DC

## 2015-04-22 NOTE — Telephone Encounter (Signed)
Patient called requesting refills. Phoned in RF and patient is aware she needs an appointment before anymore refills.

## 2015-05-14 ENCOUNTER — Ambulatory Visit (INDEPENDENT_AMBULATORY_CARE_PROVIDER_SITE_OTHER): Payer: Medicare Other | Admitting: Internal Medicine

## 2015-05-14 ENCOUNTER — Encounter: Payer: Self-pay | Admitting: Internal Medicine

## 2015-05-14 VITALS — BP 122/68 | HR 76 | Temp 97.8°F | Resp 17 | Ht 64.0 in | Wt 102.6 lb

## 2015-05-14 DIAGNOSIS — R739 Hyperglycemia, unspecified: Secondary | ICD-10-CM

## 2015-05-14 DIAGNOSIS — E042 Nontoxic multinodular goiter: Secondary | ICD-10-CM | POA: Diagnosis not present

## 2015-05-14 DIAGNOSIS — I10 Essential (primary) hypertension: Secondary | ICD-10-CM

## 2015-05-14 DIAGNOSIS — E785 Hyperlipidemia, unspecified: Secondary | ICD-10-CM | POA: Diagnosis not present

## 2015-05-14 MED ORDER — AMLODIPINE BESYLATE-VALSARTAN 10-320 MG PO TABS: ORAL_TABLET | ORAL | Status: DC

## 2015-05-14 MED ORDER — NEBIVOLOL HCL 10 MG PO TABS: ORAL_TABLET | ORAL | Status: DC

## 2015-05-14 NOTE — Progress Notes (Signed)
Patient ID: [name], female   DOB: 11/02/1935, 80 y.o.   MRN: [id number]   Location:  [facility]  Provider: Jeanmarie Hubert, M.D.  Code Status: Full Goals of Care:  Advanced Directives 05/14/2015  Does patient have an advance directive? No  Would patient like information on creating an advanced directive? No - patient declined information     Chief Complaint  Patient presents with  . Medical Management of Chronic Issues    Follow up and medication refills; medication review    HPI: Patient is a 80 y.o. female seen today for medical management of chronic diseases.  Patient is generally seen by Dr. Hollace Kinnier. She was last seen 11/06/2014 with acute visit by [name], nurse practitioner. At that time she had an ongoing rash between her legs for several weeks. Genital herpes. She had been given topical acyclovir. Rashes since resolved.  Since last seen here, she had cataract surgery in November 2016 in both eyes. Surgery performed by Dr. Rutherford Guys. She feels that she has gotten over this and her vision has been improved.  Essential hypertension, benign - controlled   Hyperglycemia - controlled  Hyperlipidemia with target LDL less than 100 - follow-up routinely  Multinodular goiter - unchanged     Past Medical History  Diagnosis Date  . Type II or unspecified type diabetes mellitus without mention of complication, uncontrolled   . Hypertension   . Anxiety   . Hyperlipidemia   . Herpes simplex   . Goiter   . Renal cyst   . PUD (peptic ulcer disease)   . Endometriosis   . Unspecified disorder of kidney and ureter   . Sciatica   . Scoliosis (and kyphoscoliosis), idiopathic   . Loss of weight   . Urinary frequency   . Other abnormal blood chemistry   . Cholelithiasis   . Peptic ulcer   . Nontoxic multinodular goiter   . Anemia     Past Surgical History  Procedure Laterality Date  . Abdominal hysterectomy  1971  . Repair of perforated ulcer  1973 &1975   Dr Vida Rigger  . Stomach surgery  1977    removed 1/2 stomach  . Cataract extraction  12/2014     Dr. Rutherford Guys    No Known Allergies    Medication List       This list is accurate as of: 05/14/15  5:38 PM.  Always use your most recent med list.               ALPRAZolam 0.25 MG tablet  Commonly known as:  XANAX  Take 1/2 tablet by mouth once daily as needed for anxiety     amLODipine-valsartan 10-320 MG tablet  Commonly known as:  EXFORGE  Take 1 tablet by mouth daily. Brand Name ONLY     nebivolol 10 MG tablet  Commonly known as:  BYSTOLIC  Take 1/2 tablet by mouth once daily for blood pressure     PROBIOTIC DAILY PO  Take by mouth. Take one tablet by mouth once daily for stomach        Review of Systems:  Review of Systems  Constitutional: Negative for fever, chills and diaphoresis.  HENT: Negative for congestion.   Eyes:       Wears glasses  Respiratory: Negative for cough and shortness of breath.   Cardiovascular: Negative for chest pain and leg swelling.  Gastrointestinal: Positive for diarrhea. Negative for abdominal pain, constipation and blood in stool.  Can only eat small amts at a time due to prior gastric surgery  Endocrine:       History multinodular goiter  Genitourinary: Negative for dysuria, urgency and frequency.  Musculoskeletal: Negative for myalgias.  Skin: Negative for rash.       History genital herpes  Neurological: Negative for dizziness, weakness and headaches.  Hematological: Does not bruise/bleed easily.  Psychiatric/Behavioral: The patient is nervous/anxious.     Health Maintenance  Topic Date Due  . FOOT EXAM  10/17/1947  . PNA vac Low Risk Adult (1 of 2 - PCV13) 10/17/2002  . INFLUENZA VACCINE  09/17/2014  . OPHTHALMOLOGY EXAM  02/17/2015  . HEMOGLOBIN A1C  04/06/2015  . DEXA SCAN  02/17/2048 (Originally 10/17/2002)  . ZOSTAVAX  02/17/2048 (Originally 10/16/1997)  . TETANUS/TDAP  02/17/2048 (Originally 10/16/1956)  .  MAMMOGRAM  11/01/2015    Physical Exam: Filed Vitals:   05/14/15 1652  BP: 122/68  Pulse: 76  Temp: 97.8 F (36.6 C)  TempSrc: Oral  Resp: 17  Height: 5\' 4"  (1.626 m)  Weight: 102 lb 9.6 oz (46.539 kg)  SpO2: 95%   Body mass index is 17.6 kg/(m^2). Physical Exam  Constitutional: She is oriented to person, place, and time. She appears well-developed and well-nourished. No distress.  Thin white female  HENT:  Head: Normocephalic and atraumatic.  Right Ear: External ear normal.  Left Ear: External ear normal.  Nose: Nose normal.  Mouth/Throat: Oropharynx is clear and moist. No oropharyngeal exudate.  Eyes: Conjunctivae and EOM are normal. Pupils are equal, round, and reactive to light. No scleral icterus.  Neck: No JVD present. No tracheal deviation present. Thyromegaly present.  Cardiovascular: Normal rate, regular rhythm, normal heart sounds and intact distal pulses.  Exam reveals no gallop and no friction rub.   No murmur heard. Pulmonary/Chest: Effort normal and breath sounds normal. No respiratory distress. She has no wheezes. She has no rales. She exhibits no tenderness.  Abdominal: Soft. Bowel sounds are normal. She exhibits no distension and no mass. There is no tenderness.  Musculoskeletal: Normal range of motion. She exhibits no edema or tenderness.  Lymphadenopathy:    She has no cervical adenopathy.  Neurological: She is alert and oriented to person, place, and time. No cranial nerve deficit. Coordination normal.  Skin: Skin is warm and dry. No rash noted. She is not diaphoretic. No erythema. There is pallor.  Psychiatric: She has a normal mood and affect. Her behavior is normal. Judgment and thought content normal.    Labs reviewed: Basic Metabolic Panel:  Recent Labs  10/04/14 1517  NA 140  K 4.6  CL 102  CO2 21  GLUCOSE 111*  BUN 25  CREATININE 1.18*  CALCIUM 9.6   Liver Function Tests: No results for input(s): AST, ALT, ALKPHOS, BILITOT, PROT,  ALBUMIN in the last 8760 hours. No results for input(s): LIPASE, AMYLASE in the last 8760 hours. No results for input(s): AMMONIA in the last 8760 hours. CBC:  Recent Labs  10/04/14 1517  WBC 9.1  NEUTROABS 6.2  HCT 34.4  MCV 88  PLT 326   Lipid Panel: No results for input(s): CHOL, HDL, LDLCALC, TRIG, CHOLHDL, LDLDIRECT in the last 8760 hours. Lab Results  Component Value Date   HGBA1C 6.5* 10/04/2014    Assessment/Plan 1. Essential hypertension, benign - nebivolol (BYSTOLIC) 10 MG tablet; Take 1/2 tablet by mouth once daily for blood pressure  Dispense: 30 tablet; Refill: 11  2. Hyperglycemia - Microalbumin, urine; Future  3.  Hyperlipidemia with target LDL less than 100 Lipid panel, future  4. Multinodular goiter Stable

## 2015-05-20 ENCOUNTER — Other Ambulatory Visit: Payer: Self-pay | Admitting: *Deleted

## 2015-05-20 DIAGNOSIS — I10 Essential (primary) hypertension: Secondary | ICD-10-CM

## 2015-05-20 MED ORDER — AMLODIPINE BESYLATE-VALSARTAN 10-320 MG PO TABS: ORAL_TABLET | ORAL | Status: DC

## 2015-05-20 NOTE — Telephone Encounter (Signed)
[facility] 

## 2015-06-26 ENCOUNTER — Other Ambulatory Visit: Payer: Medicare Other

## 2015-06-26 DIAGNOSIS — E785 Hyperlipidemia, unspecified: Secondary | ICD-10-CM

## 2015-06-26 DIAGNOSIS — R739 Hyperglycemia, unspecified: Secondary | ICD-10-CM

## 2015-06-26 DIAGNOSIS — E042 Nontoxic multinodular goiter: Secondary | ICD-10-CM | POA: Diagnosis not present

## 2015-06-26 DIAGNOSIS — I10 Essential (primary) hypertension: Secondary | ICD-10-CM

## 2015-06-27 ENCOUNTER — Encounter: Payer: Self-pay | Admitting: *Deleted

## 2015-06-27 LAB — LIPID PANEL
Chol/HDL Ratio: 1.5 ratio units (ref 0.0–4.4)
Cholesterol, Total: 214 mg/dL — ABNORMAL HIGH (ref 100–199)
HDL: 139 mg/dL (ref >39)
LDL Calculated: 61 mg/dL (ref 0–99)
Triglycerides: 72 mg/dL (ref 0–149)
VLDL Cholesterol Cal: 14 mg/dL (ref 5–40)

## 2015-06-27 LAB — CBC WITH DIFFERENTIAL/PLATELET
Basophils Absolute: 0.1 10*3/uL (ref 0.0–0.2)
Basos: 1 %
EOS (ABSOLUTE): 0.2 10*3/uL (ref 0.0–0.4)
Eos: 3 %
Hematocrit: 36 % (ref 34.0–46.6)
Hemoglobin: 11.5 g/dL (ref 11.1–15.9)
Immature Grans (Abs): 0 10*3/uL (ref 0.0–0.1)
Immature Granulocytes: 0 %
Lymphocytes Absolute: 3.3 10*3/uL — ABNORMAL HIGH (ref 0.7–3.1)
Lymphs: 41 %
MCH: 27.9 pg (ref 26.6–33.0)
MCHC: 31.9 g/dL (ref 31.5–35.7)
MCV: 87 fL (ref 79–97)
Monocytes Absolute: 0.6 10*3/uL (ref 0.1–0.9)
Monocytes: 8 %
Neutrophils Absolute: 3.9 10*3/uL (ref 1.4–7.0)
Neutrophils: 47 %
Platelets: 354 10*3/uL (ref 150–379)
RBC: 4.12 x10E6/uL (ref 3.77–5.28)
RDW: 14.5 % (ref 12.3–15.4)
WBC: 8 10*3/uL (ref 3.4–10.8)

## 2015-06-27 LAB — HEMOGLOBIN A1C
Est. average glucose Bld gHb Est-mCnc: 131 mg/dL
Hgb A1c MFr Bld: 6.2 % — ABNORMAL HIGH (ref 4.8–5.6)

## 2015-06-27 LAB — COMPREHENSIVE METABOLIC PANEL
ALT: 15 IU/L (ref 0–32)
AST: 22 IU/L (ref 0–40)
Albumin/Globulin Ratio: 1.8 (ref 1.2–2.2)
Albumin: 4.4 g/dL (ref 3.5–4.8)
Alkaline Phosphatase: 76 IU/L (ref 39–117)
BUN/Creatinine Ratio: 18 (ref 12–28)
BUN: 22 mg/dL (ref 8–27)
Bilirubin Total: 0.3 mg/dL (ref 0.0–1.2)
CO2: 17 mmol/L — ABNORMAL LOW (ref 18–29)
Calcium: 9.7 mg/dL (ref 8.7–10.3)
Chloride: 106 mmol/L (ref 96–106)
Creatinine, Ser: 1.19 mg/dL — ABNORMAL HIGH (ref 0.57–1.00)
GFR calc Af Amer: 51 mL/min/{1.73_m2} — ABNORMAL LOW (ref >59)
GFR calc non Af Amer: 44 mL/min/{1.73_m2} — ABNORMAL LOW (ref >59)
Globulin, Total: 2.5 g/dL (ref 1.5–4.5)
Glucose: 106 mg/dL — ABNORMAL HIGH (ref 65–99)
Potassium: 4.5 mmol/L (ref 3.5–5.2)
Sodium: 144 mmol/L (ref 134–144)
Total Protein: 6.9 g/dL (ref 6.0–8.5)

## 2015-06-27 LAB — MICROALBUMIN, URINE: Microalbumin, Urine: 18.7 ug/mL

## 2015-06-27 LAB — TSH: TSH: 0.93 u[IU]/mL (ref 0.450–4.500)

## 2015-07-01 ENCOUNTER — Encounter: Payer: Self-pay | Admitting: Internal Medicine

## 2015-07-01 ENCOUNTER — Ambulatory Visit (INDEPENDENT_AMBULATORY_CARE_PROVIDER_SITE_OTHER): Payer: Medicare Other | Admitting: Internal Medicine

## 2015-07-01 VITALS — BP 148/78 | HR 92 | Temp 98.3°F | Ht 64.0 in | Wt 107.0 lb

## 2015-07-01 DIAGNOSIS — I10 Essential (primary) hypertension: Secondary | ICD-10-CM

## 2015-07-01 DIAGNOSIS — R739 Hyperglycemia, unspecified: Secondary | ICD-10-CM | POA: Diagnosis not present

## 2015-07-01 DIAGNOSIS — G4700 Insomnia, unspecified: Secondary | ICD-10-CM | POA: Insufficient documentation

## 2015-07-01 DIAGNOSIS — E785 Hyperlipidemia, unspecified: Secondary | ICD-10-CM | POA: Diagnosis not present

## 2015-07-01 DIAGNOSIS — E042 Nontoxic multinodular goiter: Secondary | ICD-10-CM

## 2015-07-01 DIAGNOSIS — Z0000 Encounter for general adult medical examination without abnormal findings: Secondary | ICD-10-CM

## 2015-07-01 MED ORDER — ALPRAZOLAM 0.25 MG PO TABS: ORAL_TABLET | ORAL | Status: DC

## 2015-07-01 NOTE — Progress Notes (Signed)
Location:  [facility] clinic Provider: Yeraldin Litzenberger L. Mariea Clonts, D.O., C.M.D.  Patient Care Team: Gayland Curry, DO as PCP - General (Geriatric Medicine) Alden Hipp, MD as Consulting Physician (Obstetrics and Gynecology) Rutherford Guys, MD as Consulting Physician (Ophthalmology)  Extended Emergency Contact Information Primary Emergency Contact: Renold Genta [place] Phone: [phone] Relation: Friend  Goals of Care: Advanced Directive information Advanced Directives 07/01/2015  Does patient have an advance directive? No  Would patient like information on creating an advanced directive? -     Chief Complaint  Patient presents with  . Annual Exam    wellness exam  . MMSE    29/30 passed clock drawing    HPI: Patient is a 80 y.o. female seen in today for an annual wellness exam.    BP elevated today.  She is upset about some bills.    Multinodular goiter:  Stable, no changes.  Herpes:  Hyperglycemia:  hba1c improved on labs.  Hyperlipidemia:  Has refused meds, has great HDL.  Waking up every 2 hours lately.  Is a recovering alcoholic of 30 years.  Doesn't think she would abuse a medication for sleep.  Discussed risks of sedative/hypnotics.    Depression screen [facility] 2/9 07/01/2015 05/14/2015 10/05/2013 05/23/2012  Decreased Interest 0 0 1 0  Down, Depressed, Hopeless 0 0 0 0  PHQ - 2 Score 0 0 1 0    Fall Risk  07/01/2015 05/14/2015 11/06/2014 10/04/2014 05/14/2014  Falls in the past year? No No No No No   MMSE - Mini Mental State Exam 07/01/2015 04/09/2014  Orientation to time 5 5  Orientation to Place 5 5  Registration 3 3  Attention/ Calculation 5 5  Recall 2 2  Language- name 2 objects 2 2  Language- repeat 1 1  Language- follow 3 step command 3 3  Language- read & follow direction 1 1  Write a sentence 1 1  Copy design 1 1  Total score 29 29  passed clock  Health Maintenance  Topic Date Due  . FOOT EXAM  10/17/1945  . OPHTHALMOLOGY EXAM   02/17/2015  . MAMMOGRAM  11/01/2015  . HEMOGLOBIN A1C  12/27/2015   Urinary incontinence?  No urinary leakage.  Does go more at night due to her medication and cranberry pills. Functional Status Survey: Is the patient deaf or have difficulty hearing?: No Does the patient have difficulty seeing, even when wearing glasses/contacts?: No Does the patient have difficulty concentrating, remembering, or making decisions?: No Does the patient have difficulty walking or climbing stairs?: No Does the patient have difficulty dressing or bathing?: No Does the patient have difficulty doing errands alone such as visiting a doctor's office or shopping?: No Exercise? Current Exercise Habits: Home exercise routine, Type of exercise: walking, Time (Minutes): 20, Frequency (Times/Week): 5, Weekly Exercise (Minutes/Week): 100, Intensity: Moderate Exercise limited by: orthopedic condition(s) Diet?  Mostly vegetarian diet.  Will eat occasional meat small portions for protein and to maintain weight. Vision Screening Comments: Dr. Rutherford Guys did cataract surgery 12/2014 surgery was very helpful. Hearing:  No problem Dentition:  No problem--does go annually for cleaning. Pain:  Generalized body aches.  Thinks she was overdoing yoga for a while.  Stopped that.  Still walking.  Past Medical History  Diagnosis Date  . Type II or unspecified type diabetes mellitus without mention of complication, uncontrolled   . Hypertension   . Anxiety   . Hyperlipidemia   . Herpes simplex   .  Goiter   . Renal cyst   . PUD (peptic ulcer disease)   . Endometriosis   . Unspecified disorder of kidney and ureter   . Sciatica   . Scoliosis (and kyphoscoliosis), idiopathic   . Loss of weight   . Urinary frequency   . Other abnormal blood chemistry   . Cholelithiasis   . Peptic ulcer   . Nontoxic multinodular goiter   . Anemia     Past Surgical History  Procedure Laterality Date  . Abdominal hysterectomy  1971  .  Repair of perforated ulcer  1973 &1975    Dr Vida Rigger  . Stomach surgery  1977    removed 1/2 stomach  . Cataract extraction  12/2014     Dr. Rutherford Guys    Social History   Social History  . Marital Status: Divorced    Spouse Name: N/A  . Number of Children: N/A  . Years of Education: N/A   Occupational History  . Not on file.   Social History Main Topics  . Smoking status: Former Smoker    Quit date: 01/09/1994  . Smokeless tobacco: Never Used  . Alcohol Use: No  . Drug Use: No  . Sexual Activity: Not on file   Other Topics Concern  . Not on file   Social History Narrative    No Known Allergies    Medication List       This list is accurate as of: 07/01/15  2:39 PM.  Always use your most recent med list.               ALPRAZolam 0.25 MG tablet  Commonly known as:  XANAX  Take 1/2 tablet by mouth once daily as needed for anxiety     amLODipine-valsartan 10-320 MG tablet  Commonly known as:  EXFORGE  Brand Name ONLY.One daily to control BP     nebivolol 10 MG tablet  Commonly known as:  BYSTOLIC  Take 1/2 tablet by mouth once daily for blood pressure     PROBIOTIC DAILY PO  Take by mouth. Take one tablet by mouth once daily for stomach       Review of Systems:  Review of Systems  Constitutional: Negative for fever, chills and malaise/fatigue.  HENT: Negative for congestion and hearing loss.   Eyes: Negative for blurred vision.       Glasses, doing much better s/p cataract surgery  Respiratory: Negative for cough and shortness of breath.   Cardiovascular: Negative for chest pain, palpitations and leg swelling.  Gastrointestinal: Positive for diarrhea. Negative for heartburn, nausea, vomiting, abdominal pain, constipation, blood in stool and melena.       Chronic loose stools due to prior partial gastrectomy  Genitourinary: Negative for dysuria, urgency, frequency and hematuria.       No further herpes outbreaks  Musculoskeletal: Positive for  myalgias and joint pain. Negative for falls.  Skin: Negative for itching and rash.  Neurological: Negative for dizziness, loss of consciousness and weakness.  Psychiatric/Behavioral: Negative for depression, memory loss and substance abuse. The patient is nervous/anxious and has insomnia.        H/o alcohol abuse    Physical Exam: Filed Vitals:   07/01/15 1342  BP: 148/78  Pulse: 92  Temp: 98.3 F (36.8 C)  TempSrc: Oral  Height: 5\' 4"  (1.626 m)  Weight: 107 lb (48.535 kg)  SpO2: 96%   Body mass index is 18.36 kg/(m^2). Physical Exam  Constitutional: She is oriented to person,  place, and time. She appears well-developed and well-nourished. No distress.  HENT:  Head: Normocephalic and atraumatic.  Right Ear: External ear normal.  Left Ear: External ear normal.  Nose: Nose normal.  Eyes: EOM are normal. Pupils are equal, round, and reactive to light.  glasses  Neck: Neck supple. No tracheal deviation present. No thyromegaly present.  Cardiovascular: Normal rate, regular rhythm, normal heart sounds and intact distal pulses.   Pulmonary/Chest: Effort normal and breath sounds normal. No respiratory distress.  Abdominal: Soft. Bowel sounds are normal. She exhibits no distension and no mass. There is no tenderness.  Musculoskeletal: Normal range of motion. She exhibits no edema or tenderness.  Lymphadenopathy:    She has no cervical adenopathy.  Neurological: She is alert and oriented to person, place, and time. No cranial nerve deficit.  Skin: Skin is warm and dry. There is pallor.  Psychiatric: She has a normal mood and affect.    Labs reviewed: Basic Metabolic Panel:  Recent Labs  10/04/14 1517 06/26/15 0826  NA 140 144  K 4.6 4.5  CL 102 106  CO2 21 17*  GLUCOSE 111* 106*  BUN 25 22  CREATININE 1.18* 1.19*  CALCIUM 9.6 9.7  TSH  --  0.930   Liver Function Tests:  Recent Labs  06/26/15 0826  AST 22  ALT 15  ALKPHOS 76  BILITOT 0.3  PROT 6.9  ALBUMIN 4.4    No results for input(s): LIPASE, AMYLASE in the last 8760 hours. No results for input(s): AMMONIA in the last 8760 hours. CBC:  Recent Labs  10/04/14 1517 06/26/15 0826  WBC 9.1 8.0  NEUTROABS 6.2 3.9  HCT 34.4 36.0  MCV 88 87  PLT 326 354   Lipid Panel:  Recent Labs  06/26/15 0826  CHOL 214*  HDL 139  LDLCALC 61  TRIG 72  CHOLHDL 1.5   Lab Results  Component Value Date   HGBA1C 6.2* 06/26/2015    Procedures: Refuses EKG, says last time she was billed when it didn't work, but scanned document is in the system from last year's physical--done due to hypertension  Assessment/Plan 1. Medicare annual wellness visit, subsequent - stable, refuses all vaccines, bone density, further mammos - CBC with Differential/Platelet; Future - Comprehensive metabolic panel; Future - Hemoglobin A1c; Future - Lipid panel; Future - TSH; Future  2. Essential hypertension, benign - bp elevated, but she is convinced it's fine, wants nothing further done, monitors at home -advised of goal and she will let me know if she is running over 150/90 at home - CBC with Differential/Platelet; Future - Comprehensive metabolic panel; Future - TSH; Future  3. Multinodular goiter -no difficulties -f/u tsh before next annual, was normal  4. Hyperlipidemia with target LDL less than 100 - lipids fine with very high HDL -cont current mgt with diet and exercise - Lipid panel; Future  5. Hyperglycemia - stable, again counseled on dietary changes and exercise - Hemoglobin A1c; Future  6. Insomnia -discussed risks of adding additional sedative/hypnotics  -I'm already not thrilled that she's on xanax, but has been for years and with her alcoholism status, it will really be hard to get her off xanax too -agrees to try melatonin 5mg  an hour before bed - ALPRAZolam (XANAX) 0.25 MG tablet; Take 1/2 tablet by mouth once daily as needed for anxiety  Dispense: 15 tablet; Refill: 5  Labs/tests  ordered:   Orders Placed This Encounter  Procedures  . CBC with Differential/Platelet    Standing  Status: Future     Number of Occurrences:      Standing Expiration Date: 06/30/2017  . Comprehensive metabolic panel    Standing Status: Future     Number of Occurrences:      Standing Expiration Date: 06/30/2017    Order Specific Question:  Has the patient fasted?    Answer:  Yes  . Hemoglobin A1c    Standing Status: Future     Number of Occurrences:      Standing Expiration Date: 06/30/2017  . Lipid panel    Standing Status: Future     Number of Occurrences:      Standing Expiration Date: 06/30/2017    Order Specific Question:  Has the patient fasted?    Answer:  Yes  . TSH    Standing Status: Future     Number of Occurrences:      Standing Expiration Date: 06/30/2017   Next appt:  1 year for annual with labs before (will call prn)  Kaniyah Lisby L. Cybil Senegal, D.O. [facility] [address], [place] [address] Cell Phone (Mon-Fri 8am-5pm):  [phone] On Call:  [phone] & follow prompts after 5pm & weekends Office Phone:  [phone] Office Fax:  [phone]

## 2015-07-01 NOTE — Patient Instructions (Signed)
Recommend melatonin 5mg  one hour before you want to be asleep.

## 2016-01-20 ENCOUNTER — Other Ambulatory Visit: Payer: Self-pay | Admitting: Internal Medicine

## 2016-01-20 DIAGNOSIS — G4700 Insomnia, unspecified: Secondary | ICD-10-CM

## 2016-01-24 ENCOUNTER — Telehealth: Payer: Self-pay | Admitting: Internal Medicine

## 2016-01-24 NOTE — Telephone Encounter (Signed)
left msg asking pt to confirm this AWV appt w/ nurse. VDM (DD) °

## 2016-02-21 ENCOUNTER — Other Ambulatory Visit: Payer: Self-pay | Admitting: Nurse Practitioner

## 2016-02-21 DIAGNOSIS — G4700 Insomnia, unspecified: Secondary | ICD-10-CM

## 2016-03-05 ENCOUNTER — Telehealth: Payer: Self-pay | Admitting: Internal Medicine

## 2016-03-05 NOTE — Telephone Encounter (Signed)
left another msg asking pt to confirm this AWV appt w/ nurse. VDM (DD) °

## 2016-03-23 ENCOUNTER — Other Ambulatory Visit: Payer: Self-pay | Admitting: Internal Medicine

## 2016-03-23 DIAGNOSIS — G4700 Insomnia, unspecified: Secondary | ICD-10-CM

## 2016-04-20 ENCOUNTER — Other Ambulatory Visit: Payer: Self-pay | Admitting: Internal Medicine

## 2016-04-20 DIAGNOSIS — G4700 Insomnia, unspecified: Secondary | ICD-10-CM

## 2016-05-11 ENCOUNTER — Other Ambulatory Visit: Payer: Self-pay | Admitting: Internal Medicine

## 2016-05-11 DIAGNOSIS — I10 Essential (primary) hypertension: Secondary | ICD-10-CM

## 2016-05-19 ENCOUNTER — Other Ambulatory Visit: Payer: Self-pay | Admitting: Internal Medicine

## 2016-05-19 DIAGNOSIS — G4700 Insomnia, unspecified: Secondary | ICD-10-CM

## 2016-05-20 ENCOUNTER — Other Ambulatory Visit: Payer: Self-pay | Admitting: Internal Medicine

## 2016-05-20 DIAGNOSIS — G4700 Insomnia, unspecified: Secondary | ICD-10-CM

## 2016-06-11 ENCOUNTER — Other Ambulatory Visit: Payer: Self-pay

## 2016-06-11 DIAGNOSIS — Z0000 Encounter for general adult medical examination without abnormal findings: Secondary | ICD-10-CM

## 2016-06-11 DIAGNOSIS — E785 Hyperlipidemia, unspecified: Secondary | ICD-10-CM

## 2016-06-11 DIAGNOSIS — I10 Essential (primary) hypertension: Secondary | ICD-10-CM

## 2016-06-11 DIAGNOSIS — R739 Hyperglycemia, unspecified: Secondary | ICD-10-CM

## 2016-06-23 ENCOUNTER — Other Ambulatory Visit: Payer: Self-pay | Admitting: Internal Medicine

## 2016-06-23 DIAGNOSIS — G4700 Insomnia, unspecified: Secondary | ICD-10-CM

## 2016-06-30 ENCOUNTER — Telehealth: Payer: Self-pay | Admitting: Internal Medicine

## 2016-06-30 NOTE — Telephone Encounter (Signed)
Sara's nursing home schedule changed, so I called the pt and left a message asking if she can come on 07/01/16 for AWV-S instead of 07/06/16. VDM (DD)

## 2016-07-01 ENCOUNTER — Ambulatory Visit: Payer: Medicare Other

## 2016-07-01 ENCOUNTER — Other Ambulatory Visit: Payer: Medicare Other

## 2016-07-01 DIAGNOSIS — Z0000 Encounter for general adult medical examination without abnormal findings: Secondary | ICD-10-CM | POA: Diagnosis not present

## 2016-07-01 DIAGNOSIS — E785 Hyperlipidemia, unspecified: Secondary | ICD-10-CM | POA: Diagnosis not present

## 2016-07-01 DIAGNOSIS — I10 Essential (primary) hypertension: Secondary | ICD-10-CM

## 2016-07-01 DIAGNOSIS — R739 Hyperglycemia, unspecified: Secondary | ICD-10-CM

## 2016-07-01 LAB — COMPLETE METABOLIC PANEL WITH GFR
ALT: 10 U/L (ref 6–29)
AST: 18 U/L (ref 10–35)
Albumin: 4.1 g/dL (ref 3.6–5.1)
Alkaline Phosphatase: 89 U/L (ref 33–130)
BUN: 24 mg/dL (ref 7–25)
CO2: 22 mmol/L (ref 20–31)
Calcium: 9.4 mg/dL (ref 8.6–10.4)
Chloride: 107 mmol/L (ref 98–110)
Creat: 1.06 mg/dL — ABNORMAL HIGH (ref 0.60–0.93)
GFR, Est African American: 58 mL/min — ABNORMAL LOW (ref >=60)
GFR, Est Non African American: 50 mL/min — ABNORMAL LOW (ref >=60)
Glucose, Bld: 100 mg/dL — ABNORMAL HIGH (ref 65–99)
Potassium: 4.6 mmol/L (ref 3.5–5.3)
Sodium: 138 mmol/L (ref 135–146)
Total Bilirubin: 0.4 mg/dL (ref 0.2–1.2)
Total Protein: 6.7 g/dL (ref 6.1–8.1)

## 2016-07-01 LAB — TSH: TSH: 1.01 mIU/L

## 2016-07-01 LAB — LIPID PANEL
Cholesterol: 200 mg/dL — ABNORMAL HIGH (ref <200)
HDL: 141 mg/dL (ref >50)
LDL Cholesterol: 44 mg/dL (ref <100)
Total CHOL/HDL Ratio: 1.4 Ratio (ref <5.0)
Triglycerides: 76 mg/dL (ref <150)
VLDL: 15 mg/dL (ref <30)

## 2016-07-01 LAB — CBC WITH DIFFERENTIAL/PLATELET
Basophils Absolute: 79 cells/uL (ref 0–200)
Basophils Relative: 1 %
Eosinophils Absolute: 237 cells/uL (ref 15–500)
Eosinophils Relative: 3 %
HCT: 34 % — ABNORMAL LOW (ref 35.0–45.0)
Hemoglobin: 10.9 g/dL — ABNORMAL LOW (ref 11.7–15.5)
Lymphocytes Relative: 35 %
Lymphs Abs: 2765 cells/uL (ref 850–3900)
MCH: 26.8 pg — ABNORMAL LOW (ref 27.0–33.0)
MCHC: 32.1 g/dL (ref 32.0–36.0)
MCV: 83.7 fL (ref 80.0–100.0)
MPV: 9.8 fL (ref 7.5–12.5)
Monocytes Absolute: 711 cells/uL (ref 200–950)
Monocytes Relative: 9 %
Neutro Abs: 4108 cells/uL (ref 1500–7800)
Neutrophils Relative %: 52 %
Platelets: 353 10*3/uL (ref 140–400)
RBC: 4.06 MIL/uL (ref 3.80–5.10)
RDW: 14.9 % (ref 11.0–15.0)
WBC: 7.9 10*3/uL (ref 3.8–10.8)

## 2016-07-02 LAB — HEMOGLOBIN A1C
Hgb A1c MFr Bld: 5.9 % — ABNORMAL HIGH (ref <5.7)
Mean Plasma Glucose: 123 mg/dL

## 2016-07-06 ENCOUNTER — Ambulatory Visit: Payer: Medicare Other

## 2016-07-06 ENCOUNTER — Ambulatory Visit (INDEPENDENT_AMBULATORY_CARE_PROVIDER_SITE_OTHER): Payer: Medicare Other | Admitting: Internal Medicine

## 2016-07-06 ENCOUNTER — Encounter: Payer: Self-pay | Admitting: Internal Medicine

## 2016-07-06 VITALS — BP 110/68 | HR 69 | Temp 98.5°F | Ht 64.0 in | Wt 109.0 lb

## 2016-07-06 DIAGNOSIS — Z0000 Encounter for general adult medical examination without abnormal findings: Secondary | ICD-10-CM

## 2016-07-06 DIAGNOSIS — R739 Hyperglycemia, unspecified: Secondary | ICD-10-CM

## 2016-07-06 DIAGNOSIS — D649 Anemia, unspecified: Secondary | ICD-10-CM | POA: Diagnosis not present

## 2016-07-06 DIAGNOSIS — F5104 Psychophysiologic insomnia: Secondary | ICD-10-CM | POA: Diagnosis not present

## 2016-07-06 DIAGNOSIS — E785 Hyperlipidemia, unspecified: Secondary | ICD-10-CM | POA: Diagnosis not present

## 2016-07-06 DIAGNOSIS — I10 Essential (primary) hypertension: Secondary | ICD-10-CM | POA: Diagnosis not present

## 2016-07-06 DIAGNOSIS — E042 Nontoxic multinodular goiter: Secondary | ICD-10-CM | POA: Diagnosis not present

## 2016-07-06 MED ORDER — ALPRAZOLAM 0.25 MG PO TABS: 0.2500 mg | ORAL_TABLET | Freq: Every evening | ORAL | 0 refills | Status: DC | PRN

## 2016-07-06 NOTE — Progress Notes (Signed)
Provider:  Rexene Edison. Mariea Clonts, D.O., C.M.D. Location:   [place]   Place of Service:   clinic  Previous PCP: Gayland Curry, DO Patient Care Team: Gayland Curry, DO as PCP - General (Geriatric Medicine) Alden Hipp, MD as Consulting Physician (Obstetrics and Gynecology) Rutherford Guys, MD as Consulting Physician (Ophthalmology)  Extended Emergency Contact Information Primary Emergency Contact: Renold Genta [place] Phone: [phone] Relation: Friend  Code Status: full code Goals of Care: Advanced Directive information Advanced Directives 07/06/2016  Does Patient Have a Medical Advance Directive? No  Would patient like information on creating a medical advance directive? No - Patient declined   Chief Complaint  Patient presents with  . Annual Exam    CPE  . MMSE    30/30 passed clock    HPI: Patient is a 81 y.o. female seen today for an annual physical exam.  Weight up 2 lbs from last visit.   She continues to decline bone density, shingrix, pneumonia vaccines, flu vaccines, tdap vaccine.   MMSE was 30/30, passed clock.  BP at goal.on exforge and bystolic.  Today was low for her.  She made a special effort to be calm.  No dizziness or lightheadedness.    She feels like she's more anxious and requiring a higher dose of xanax.    No concerns today.    Anemia:  Is limited in her greens intake.  She does not order meat but won't take it out of a stew.  Having more tofu and soy.    Also taking calcium pills b/c of "pre-cramps" at night.  It has helped tremendously and she's more flexible now.    She has a boyfriend.    Past Medical History:  Diagnosis Date  . Anemia   . Anxiety   . Cholelithiasis   . Endometriosis   . Goiter   . Herpes simplex   . Hyperlipidemia   . Hypertension   . Loss of weight   . Nontoxic multinodular goiter   . Other abnormal blood chemistry   . Peptic ulcer   . PUD (peptic ulcer disease)   . Renal cyst   .  Sciatica   . Scoliosis (and kyphoscoliosis), idiopathic   . Type II or unspecified type diabetes mellitus without mention of complication, uncontrolled   . Unspecified disorder of kidney and ureter   . Urinary frequency    Past Surgical History:  Procedure Laterality Date  . ABDOMINAL HYSTERECTOMY  1971  . CATARACT EXTRACTION  12/2014    Dr. Rutherford Guys  . REPAIR OF PERFORATED ULCER  1973 &1975   Dr Vida Rigger  . [place]   removed 1/2 stomach    reports that she quit smoking about 22 years ago. She has never used smokeless tobacco. She reports that she does not drink alcohol or use drugs.  Functional Status Survey:    Family History  Problem Relation Age of Onset  . Obesity Mother     Health Maintenance  Topic Date Due  . FOOT EXAM  10[birth year]/1947  . OPHTHALMOLOGY EXAM  02/17/2015  . MAMMOGRAM  11/01/2015  . HEMOGLOBIN A1C  01/01/2017    No Known Allergies  Allergies as of 07/06/2016   No Known Allergies     Medication List       Accurate as of 07/06/16  1:59 PM. Always use your most recent med list.          ALPRAZolam 0.25 MG tablet Commonly  known as:  XANAX TAKE 1/2 TABLET BY MOUTH DAILY AS NEEDED FOR ANXIETY   EXFORGE 10-320 MG tablet Generic drug:  amLODipine-valsartan take 1 tablet by mouth once daily   nebivolol 10 MG tablet Commonly known as:  BYSTOLIC Take 1/2 tablet by mouth once daily for blood pressure       Review of Systems  Constitutional: Negative for chills, diaphoresis, fever, malaise/fatigue and weight loss.  HENT: Negative for congestion and hearing loss.   Eyes: Negative for blurred vision.  Respiratory: Negative for cough and shortness of breath.   Cardiovascular: Negative for chest pain, palpitations and leg swelling.  Gastrointestinal: Positive for diarrhea. Negative for abdominal pain, blood in stool, constipation and melena.  Genitourinary: Negative for dysuria.       No herpes issues lately  Musculoskeletal:  Negative for falls.  Skin: Negative for itching and rash.       Dry skin patches on top posterior ears and tragus area bilaterally, pruritic  Neurological: Negative for dizziness, loss of consciousness and weakness.  Endo/Heme/Allergies: Does not bruise/bleed easily.  Psychiatric/Behavioral: Negative for depression and memory loss. The patient is nervous/anxious and has insomnia.     Vitals:   07/06/16 1339  BP: 110/68  Pulse: 69  Temp: 98.5 F (36.9 C)  TempSrc: Oral  SpO2: 98%  Weight: 109 lb (49.4 kg)  Height: 5\' 4"  (1.626 m)   Body mass index is 18.71 kg/m. Physical Exam  Constitutional: She is oriented to person, place, and time. She appears well-developed and well-nourished. No distress.  Thin white female  HENT:  Head: Normocephalic and atraumatic.  Right Ear: External ear normal.  Left Ear: External ear normal.  Nose: Nose normal.  Mouth/Throat: Oropharynx is clear and moist. No oropharyngeal exudate.  Eyes: Conjunctivae and EOM are normal. Pupils are equal, round, and reactive to light.  Glasses with tint, bifocals in them  Neck: Normal range of motion. Neck supple. No JVD present.  Cardiovascular: Normal rate, regular rhythm, normal heart sounds and intact distal pulses.   No murmur heard. Pulmonary/Chest: Effort normal and breath sounds normal. No respiratory distress. Right breast exhibits no inverted nipple, no mass, no nipple discharge, no skin change and no tenderness. Left breast exhibits no inverted nipple, no mass, no nipple discharge, no skin change and no tenderness.  Abdominal: Soft. Bowel sounds are normal. She exhibits no distension. There is no tenderness.  Musculoskeletal: Normal range of motion. She exhibits no tenderness.  Lymphadenopathy:    She has no cervical adenopathy.  Neurological: She is alert and oriented to person, place, and time. No cranial nerve deficit.  Skin: Skin is warm and dry.  Dry skin on ears  Psychiatric: She has a normal  mood and affect.    Labs reviewed: Basic Metabolic Panel:  Recent Labs  07/01/16 0808  NA 138  K 4.6  CL 107  CO2 22  GLUCOSE 100*  BUN 24  CREATININE 1.06*  CALCIUM 9.4   Liver Function Tests:  Recent Labs  07/01/16 0808  AST 18  ALT 10  ALKPHOS 89  BILITOT 0.4  PROT 6.7  ALBUMIN 4.1   No results for input(s): LIPASE, AMYLASE in the last 8760 hours. No results for input(s): AMMONIA in the last 8760 hours. CBC:  Recent Labs  07/01/16 0808  WBC 7.9  NEUTROABS 4,108  HGB 10.9*  HCT 34.0*  MCV 83.7  PLT 353   Cardiac Enzymes: No results for input(s): CKTOTAL, CKMB, CKMBINDEX, TROPONINI in the last  8760 hours. BNP: Invalid input(s): POCBNP Lab Results  Component Value Date   HGBA1C 5.9 (H) 07/01/2016   Lab Results  Component Value Date   TSH 1.01 07/01/2016    Assessment/Plan 1. Annual physical exam -performed today -refuses majority of preventive care as above -advised to f/u with AWV anyway in hopes that Clarise Cruz, Therapist, sports, has better luck convincing her to get these than I have had  2. Essential hypertension, benign -bp at goal with exforge, bystolic and no dizziness or lightheadedness  3. Multinodular goiter -last tsh wnl, cont to monitor annually  4. Hyperlipidemia with target LDL less than 100 -lipids at goal with high HDL  5. Hyperglycemia -sugar average trended down, but she has not changed her diet  6. Psychophysiological insomnia -requests increase in xanax  - ALPRAZolam (XANAX) 0.25 MG tablet; Take 1 tablet (0.25 mg total) by mouth at bedtime as needed for sleep.  Dispense: 30 tablet; Refill: 0  7. Anemia, unspecified type -due to her limited diet due to her IBS with diarrhea and her minimal meat/protein in her diet  Labs/tests ordered:  No orders of the defined types were placed in this encounter.  6 mos med mgt, Reschedule AWV    Carri Spillers L. Kelsi Benham, D.O. [facility] [address], [place] [address] Cell Phone (Mon-Fri 8am-5pm):  [phone] On Call:  [phone] & follow prompts after 5pm & weekends Office Phone:  [phone] Office Fax:  [phone]

## 2016-07-06 NOTE — Patient Instructions (Addendum)
Reschedule AWV.    Try applying hydrocortisone cream to your ears daily after your shower.  If the areas grow or do not get better with that, let me know and we can consider a dermatology referral.

## 2016-07-08 ENCOUNTER — Other Ambulatory Visit: Payer: Self-pay | Admitting: Internal Medicine

## 2016-07-08 DIAGNOSIS — I10 Essential (primary) hypertension: Secondary | ICD-10-CM

## 2016-07-12 ENCOUNTER — Other Ambulatory Visit: Payer: Self-pay | Admitting: Internal Medicine

## 2016-07-12 DIAGNOSIS — I10 Essential (primary) hypertension: Secondary | ICD-10-CM

## 2016-07-17 ENCOUNTER — Ambulatory Visit: Payer: Medicare Other

## 2016-08-04 ENCOUNTER — Telehealth: Payer: Self-pay | Admitting: Internal Medicine

## 2016-08-04 NOTE — Telephone Encounter (Signed)
I left a message asking the pt to reschedule her AWV per Dr. Cyndi Lennert notes. VDM (DD)

## 2016-08-06 ENCOUNTER — Other Ambulatory Visit: Payer: Self-pay | Admitting: Internal Medicine

## 2016-08-06 DIAGNOSIS — I10 Essential (primary) hypertension: Secondary | ICD-10-CM

## 2016-08-21 ENCOUNTER — Other Ambulatory Visit: Payer: Self-pay | Admitting: Internal Medicine

## 2016-08-21 DIAGNOSIS — F5104 Psychophysiologic insomnia: Secondary | ICD-10-CM

## 2016-09-10 ENCOUNTER — Encounter: Payer: Self-pay | Admitting: Internal Medicine

## 2016-09-10 ENCOUNTER — Ambulatory Visit (INDEPENDENT_AMBULATORY_CARE_PROVIDER_SITE_OTHER): Payer: Medicare Other | Admitting: Internal Medicine

## 2016-09-10 VITALS — BP 122/78 | HR 78 | Temp 98.4°F | Wt 107.0 lb

## 2016-09-10 DIAGNOSIS — J0100 Acute maxillary sinusitis, unspecified: Secondary | ICD-10-CM

## 2016-09-10 DIAGNOSIS — R6884 Jaw pain: Secondary | ICD-10-CM

## 2016-09-10 DIAGNOSIS — K0889 Other specified disorders of teeth and supporting structures: Secondary | ICD-10-CM | POA: Diagnosis not present

## 2016-09-10 MED ORDER — BENZOCAINE 7.5 % MT GEL: Freq: Three times a day (TID) | OROMUCOSAL | 0 refills | Status: DC | PRN

## 2016-09-10 NOTE — Patient Instructions (Signed)
Try baby orajel--if not effective or other parts of your mouth are sore, please call back for [facility] mouthwash.

## 2016-09-10 NOTE — Progress Notes (Signed)
Location:  [facility] clinic Provider: Cullin [name] L. Mariea Clonts, D.O., C.M.D.  Code Status: DNR Goals of Care:  Advanced Directives 07/06/2016  Does Patient Have a Medical Advance Directive? No  Would patient like information on creating a medical advance directive? No - Patient declined     Chief Complaint  Patient presents with  . Acute Visit    jaw pain, headache    HPI: Patient is a 81 y.o. [name] seen today for an acute visit for pain in upper jaw.    Left upper tooth was causing a twinge for 2-3 years.  She's dreaded having to have a root canal.  She's had xrays a few times and they've been negative for any cavity.  Has two teeth that bang together due to an occlusion issue.  She thinks she gets over-energetic eating and the jaw hurts.  She then was referred to [name] and she had imaging.  He thought perhaps a nerve may be impinged.  She heard from neurology yesterday.  She does not have a sense that it's neurologic.  Has a tender spot on the gum of the teeth.  She also has a sore jaw that doesn't heal.  Thinks she may have had a sinus infection that made it worse.  She has an ointment that she decided to put in her mouth which helped greatly with the soreness.  This was a camphor ointment for cold sores.  She reports that adult orajel is too potent for her.  She gets a "sore throat" of her mouth at times.    She has had congestion through her nose and maxillary sinuses bilaterally.  She'll put vicks on her face and helps.    Past Medical History:  Diagnosis Date  . Anemia   . Anxiety   . Cholelithiasis   . Endometriosis   . Goiter   . Herpes simplex   . Hyperlipidemia   . Hypertension   . Loss of weight   . Nontoxic multinodular goiter   . Other abnormal blood chemistry   . Peptic ulcer   . PUD (peptic ulcer disease)   . Renal cyst   . Sciatica   . Scoliosis (and kyphoscoliosis), idiopathic   . Type II or unspecified type diabetes mellitus without mention of complication,  uncontrolled   . Unspecified disorder of kidney and ureter   . Urinary frequency     Past Surgical History:  Procedure Laterality Date  . ABDOMINAL HYSTERECTOMY  1971  . CATARACT EXTRACTION  12/2014    [name]  . REPAIR OF PERFORATED ULCER  1973 &1975   [name]  . STOMACH SURGERY  1977   removed 1/2 stomach    No Known Allergies  Allergies as of 09/10/2016   No Known Allergies     Medication List       Accurate as of 09/10/16 10:57 AM. Always use your most recent med list.          ALPRAZolam 0.25 MG tablet Commonly known as:  XANAX TAKE 1 TABLET BY MOUTH AT BEDTIME AS NEEDED FOR SLEEP   BYSTOLIC 10 MG tablet Generic drug:  nebivolol TAKE 1/2 TABLET BY MOUTH ONCE DAILY   EXFORGE 10-320 MG tablet Generic drug:  amLODipine-valsartan take 1 tablet by mouth once daily       Review of Systems:  Review of Systems  Constitutional: Negative for chills, fever and malaise/fatigue.  HENT: Positive for congestion, hearing loss and sinus pain. Negative for ear discharge,  ear pain, nosebleeds, sore throat and tinnitus.        Jaw pain, tooth pain  Eyes: Negative for blurred vision.       Glasses  Respiratory: Negative for cough, shortness of breath and stridor.   Cardiovascular: Negative for chest pain and palpitations.  Gastrointestinal: Negative for abdominal pain.  Genitourinary: Negative for dysuria.  Musculoskeletal: Negative for falls.  Skin: Negative for itching and rash.  Neurological: Negative for dizziness, tingling, sensory change and weakness.  Psychiatric/Behavioral: Negative for depression.    Health Maintenance  Topic Date Due  . FOOT EXAM  10/17/1945  . OPHTHALMOLOGY EXAM  02/17/2015  . MAMMOGRAM  11/01/2015  . HEMOGLOBIN A1C  01/01/2017    Physical Exam: Vitals:   09/10/16 1042  BP: 122/78  Pulse: 78  Temp: 98.4 F (36.9 C)  TempSrc: Oral  SpO2: 99%  Weight: 107 lb (48.5 kg)   Body mass index is 18.37 kg/m. Physical Exam    Constitutional: She is oriented to person, place, and time. No distress.  Thin white [name]  HENT:  Head: Normocephalic and atraumatic.  Right Ear: External ear normal.  Left Ear: External ear normal.  Nose: Nose normal.  Mouth/Throat: Oropharynx is clear and moist. No oropharyngeal exudate.  Left upper tooth with crown on it that does not go all the way up, tender just anterior to that tooth, but no erythema, lesion, drainage or swelling seen; face is nontender and sensation intact, CNs intact; TMJ opens equally; does have uneven bite anteriorly  Eyes: Conjunctivae are normal.  glasses  Neck: Normal range of motion. Neck supple. No JVD present. No tracheal deviation present. No thyromegaly present.  Pulmonary/Chest: Effort normal. No stridor.  Musculoskeletal: Normal range of motion.  Lymphadenopathy:    She has no cervical adenopathy.  Neurological: She is alert and oriented to person, place, and time. No cranial nerve deficit.  Skin: Skin is warm and dry. There is pallor.    Labs reviewed: Basic Metabolic Panel:  Recent Labs  07/01/16 0808  NA 138  K 4.6  CL 107  CO2 22  GLUCOSE 100*  BUN 24  CREATININE 1.06*  CALCIUM 9.4  TSH 1.01   Liver Function Tests:  Recent Labs  07/01/16 0808  AST 18  ALT 10  ALKPHOS 89  BILITOT 0.4  PROT 6.7  ALBUMIN 4.1   No results for input(s): LIPASE, AMYLASE in the last 8760 hours. No results for input(s): AMMONIA in the last 8760 hours. CBC:  Recent Labs  07/01/16 0808  WBC 7.9  NEUTROABS 4,108  HGB 10.9*  HCT 34.0*  MCV 83.7  PLT 353   Lipid Panel:  Recent Labs  07/01/16 0808  CHOL 200*  HDL 141  LDLCALC 44  TRIG 76  CHOLHDL 1.4   Lab Results  Component Value Date   HGBA1C 5.9 (H) 07/01/2016     Assessment/Plan 1. Tooth pain -seems it's more in her gums than her tooth, but does have one cap that's not all the way up and pain is anterior to that tooth -dentist and oral surgeon both say it's not a  dental problem -advised to use baby orajel to numb the sore area (but no lesion seen)  2. Pain in upper jaw -does not seem to be truly in her jaw--no tenderness or pain with palpation of jaw movement, but does report with chewing due to uneven bite -sensation intact on face -all of these go against it being a neuropathic pain or neuralgia -  pt has been referred by oral surgery to neurology to r/o neurologic etiology, but seems to be in the dental/oral surgery realm to me  3. Acute non-recurrent maxillary sinusitis -seems to be more allergic nonbacterial etiology with some mild congestion -advised to use nettipot to flush sinuses  Labs/tests ordered:  No orders of the defined types were placed in this encounter.   Next appt:  01/04/2017  Loree Shehata L. Fitzroy Mikami, D.O. [facility] [address], [place] [address] Cell Phone (Mon-Fri 8am-5pm):  [phone] On Call:  [phone] & follow prompts after 5pm & weekends Office Phone:  [phone] Office Fax:  [phone]

## 2016-09-17 DIAGNOSIS — H353133 Nonexudative age-related macular degeneration, bilateral, advanced atrophic without subfoveal involvement: Secondary | ICD-10-CM | POA: Diagnosis not present

## 2016-09-17 DIAGNOSIS — H52203 Unspecified astigmatism, bilateral: Secondary | ICD-10-CM | POA: Diagnosis not present

## 2016-09-17 DIAGNOSIS — H5211 Myopia, right eye: Secondary | ICD-10-CM | POA: Diagnosis not present

## 2016-09-17 DIAGNOSIS — H524 Presbyopia: Secondary | ICD-10-CM | POA: Diagnosis not present

## 2016-09-17 DIAGNOSIS — Z961 Presence of intraocular lens: Secondary | ICD-10-CM | POA: Diagnosis not present

## 2016-09-21 ENCOUNTER — Other Ambulatory Visit: Payer: Self-pay | Admitting: Internal Medicine

## 2016-09-21 DIAGNOSIS — F5104 Psychophysiologic insomnia: Secondary | ICD-10-CM

## 2016-09-21 NOTE — Telephone Encounter (Signed)
rx called into pharmacy

## 2016-10-12 ENCOUNTER — Encounter: Payer: Self-pay | Admitting: Nurse Practitioner

## 2016-10-12 ENCOUNTER — Ambulatory Visit (INDEPENDENT_AMBULATORY_CARE_PROVIDER_SITE_OTHER): Payer: Medicare Other | Admitting: Nurse Practitioner

## 2016-10-12 VITALS — BP 120/76 | HR 69 | Temp 98.1°F | Resp 17 | Ht 64.0 in | Wt 106.8 lb

## 2016-10-12 DIAGNOSIS — R3915 Urgency of urination: Secondary | ICD-10-CM

## 2016-10-12 DIAGNOSIS — R82998 Other abnormal findings in urine: Secondary | ICD-10-CM

## 2016-10-12 DIAGNOSIS — R8299 Other abnormal findings in urine: Secondary | ICD-10-CM | POA: Diagnosis not present

## 2016-10-12 LAB — POCT URINALYSIS DIPSTICK
BILIRUBIN UA: NEGATIVE
Blood, UA: NEGATIVE
Glucose, UA: NEGATIVE
KETONES UA: NEGATIVE
Nitrite, UA: NEGATIVE
PROTEIN UA: NEGATIVE
Spec Grav, UA: 1.015 (ref 1.010–1.025)
Urobilinogen, UA: 0.2 E.U./dL
pH, UA: 5 (ref 5.0–8.0)

## 2016-10-12 MED ORDER — AMOXICILLIN-POT CLAVULANATE 500-125 MG PO TABS: 1.0000 | ORAL_TABLET | Freq: Two times a day (BID) | ORAL | 0 refills | Status: DC

## 2016-10-12 NOTE — Progress Notes (Signed)
Careteam: Patient Care Team: Gayland Curry, DO as PCP - General (Geriatric Medicine) Alden Hipp, MD as Consulting Physician (Obstetrics and Gynecology) Rutherford Guys, MD as Consulting Physician (Ophthalmology)  Advanced Directive information Does Patient Have a Medical Advance Directive?: No  No Known Allergies  Chief Complaint  Patient presents with  . Acute Visit    Pt is being seen due to urinary frequency and urgency for several weeks. Pt has been drinking extra water and taking cranberry pills to help with frequency and urgency.   . Other    Pt reports she has a history of bladder spasms for several years.      HPI: Patient is a 81 y.o. female seen in the office today due urinary frequency and urgency for several weeks.  Takes cranberry pills for bladder health Rough night last night due to getting up at night.  Feeling like she has to urinate all the time. Feels like she is empty but soon after feels like she needs to go again.  Pressure in her lower abdomen. No pain with urination.  No fevers.    Review of Systems:  Review of Systems  Constitutional: Negative for chills, fever and weight loss.  Respiratory: Negative for shortness of breath.   Cardiovascular: Negative for chest pain and leg swelling.  Gastrointestinal: Positive for abdominal pain. Negative for heartburn.  Genitourinary: Positive for frequency and urgency. Negative for dysuria, flank pain and hematuria.  Psychiatric/Behavioral: The patient does not have insomnia.     Past Medical History:  Diagnosis Date  . Anemia   . Anxiety   . Cholelithiasis   . Endometriosis   . Goiter   . Herpes simplex   . Hyperlipidemia   . Hypertension   . Loss of weight   . Macular degeneration    both eyes  . Nontoxic multinodular goiter   . Other abnormal blood chemistry   . Peptic ulcer   . PUD (peptic ulcer disease)   . Renal cyst   . Sciatica   . Scoliosis (and kyphoscoliosis), idiopathic   .  Type II or unspecified type diabetes mellitus without mention of complication, uncontrolled   . Unspecified disorder of kidney and ureter   . Urinary frequency    Past Surgical History:  Procedure Laterality Date  . ABDOMINAL HYSTERECTOMY  1971  . CATARACT EXTRACTION  12/2014    Dr. Rutherford Guys  . REPAIR OF PERFORATED ULCER  1973 &1975   Dr Vida Rigger  . [place]   removed 1/2 stomach   Social History:   reports that she quit smoking about 22 years ago. She has never used smokeless tobacco. She reports that she does not drink alcohol or use drugs.  Family History  Problem Relation Age of Onset  . Obesity Mother     Medications: Patient's Medications  New Prescriptions   No medications on file  Previous Medications   ALPRAZOLAM (XANAX) 0.25 MG TABLET    Take 1 tablet (0.25 mg total) by mouth at bedtime as needed for sleep.   BENZOCAINE (ORAJEL) 10 % MUCOSAL GEL    Use as directed 1 application in the mouth or throat as needed for mouth pain.   BYSTOLIC 10 MG TABLET    TAKE 1/2 TABLET BY MOUTH ONCE DAILY   EXFORGE 10-320 MG TABLET    take 1 tablet by mouth once daily  Modified Medications   No medications on file  Discontinued Medications   BENZOCAINE (BABY ORAJEL)  7.5 % ORAL GEL    Use as directed in the mouth or throat 3 (three) times daily as needed for pain.     Physical Exam:  Vitals:   10/12/16 1144  BP: 120/76  Pulse: 69  Resp: 17  Temp: 98.1 F (36.7 C)  TempSrc: Oral  SpO2: 98%  Weight: 106 lb 12.8 oz (48.4 kg)  Height: 5\' 4"  (1.626 m)   Body mass index is 18.33 kg/m.  Physical Exam  Constitutional: She is oriented to person, place, and time. She appears well-developed and well-nourished.  Cardiovascular: Normal rate, regular rhythm and normal heart sounds.   Pulmonary/Chest: Effort normal and breath sounds normal.  Abdominal: Soft. Bowel sounds are normal. She exhibits no distension. There is no tenderness.  Neurological: She is alert and  oriented to person, place, and time.  Skin: Skin is warm and dry.    Labs reviewed: Basic Metabolic Panel:  Recent Labs  07/01/16 0808  NA 138  K 4.6  CL 107  CO2 22  GLUCOSE 100*  BUN 24  CREATININE 1.06*  CALCIUM 9.4  TSH 1.01   Liver Function Tests:  Recent Labs  07/01/16 0808  AST 18  ALT 10  ALKPHOS 89  BILITOT 0.4  PROT 6.7  ALBUMIN 4.1   No results for input(s): LIPASE, AMYLASE in the last 8760 hours. No results for input(s): AMMONIA in the last 8760 hours. CBC:  Recent Labs  07/01/16 0808  WBC 7.9  NEUTROABS 4,108  HGB 10.9*  HCT 34.0*  MCV 83.7  PLT 353   Lipid Panel:  Recent Labs  07/01/16 0808  CHOL 200*  HDL 141  LDLCALC 44  TRIG 76  CHOLHDL 1.4   TSH:  Recent Labs  07/01/16 0808  TSH 1.01   A1C: Lab Results  Component Value Date   HGBA1C 5.9 (H) 07/01/2016     Assessment/Plan 1. Urinary urgency Increase frequency and urgency that has been very uncomfortable for her.  - POCT urinalysis dipstick showing leukocytes in urine - Urine Culture  2. Leukocytes in urine - amoxicillin-clavulanate (AUGMENTIN) 500-125 MG tablet; Take 1 tablet (500 mg total) by mouth 2 (two) times daily.  Dispense: 14 tablet; Refill: 0 - Urine Culture -to increase hydration  May use over the counter pyridium 100 mg by mouth every 8 hours as needed for pain/pressure/discomfort for up to 2 days  Next appt: as scheduled, sooner if needed Chrystle Murillo K. Harle Battiest  [facility] [phone] 8 am - 5 pm) [phone] (after hours)

## 2016-10-12 NOTE — Patient Instructions (Addendum)
Will send urine off for culture To start Augmentin by mouth twice daily for 1 week while we get the culture back Cont to increase fluid intake May use over the counter pyridium 100 mg by mouth every 8 hours as needed for pain/pressure/discomfort for up to 2 days-- will turn your urine orange

## 2016-10-14 LAB — URINE CULTURE

## 2016-10-20 ENCOUNTER — Encounter: Payer: Self-pay | Admitting: Internal Medicine

## 2016-10-20 ENCOUNTER — Ambulatory Visit (INDEPENDENT_AMBULATORY_CARE_PROVIDER_SITE_OTHER): Payer: Medicare Other | Admitting: Internal Medicine

## 2016-10-20 VITALS — BP 118/74 | HR 62 | Temp 98.7°F | Resp 17 | Ht 64.0 in | Wt 106.6 lb

## 2016-10-20 DIAGNOSIS — B009 Herpesviral infection, unspecified: Secondary | ICD-10-CM

## 2016-10-20 DIAGNOSIS — N3000 Acute cystitis without hematuria: Secondary | ICD-10-CM

## 2016-10-20 MED ORDER — LEVOFLOXACIN 500 MG PO TABS: 500.0000 mg | ORAL_TABLET | Freq: Every day | ORAL | 0 refills | Status: DC

## 2016-10-20 MED ORDER — ACYCLOVIR 5 % EX OINT: 1.0000 "application " | TOPICAL_OINTMENT | Freq: Every day | CUTANEOUS | 1 refills | Status: DC

## 2016-10-20 NOTE — Patient Instructions (Addendum)
Take levaquin daily x 7 days for UTI  Take probiotic daily while on antibiotic  Use zovirax cream to groin rash 6 times a day for 7 days  continue other medications as ordered  Wash hands frequently to prevent rash from spreading  Follow up as scheduled

## 2016-10-20 NOTE — Progress Notes (Signed)
Patient ID: [name], female   DOB: 12-31-1935, 81 y.o.   MRN: [id number]    Location:  [facility] Place of Service: OFFICE  Chief Complaint  Patient presents with  . Acute Visit    Pt is being seen due to rash in groin area, urinary frequency/urgency. Pt completed antibiotic for UTI 3 days ago but symptoms returned the following day.   . Other    Pt took AZO tablet for urinary symptoms last night-urine still dark orange so unable to do POCT urinalysis    HPI:  81 yo female seen today for urinary sx's and rash. She was dx with UTI last week. She was started on Augmentin. Urine cx (+) E coli with intermediate susceptibility with augmentin. She completed 7 days of augmentin 3 days ago. She states urinary frequency, urgency and dysuria has returned. She noticed groin rash yesterday that did not improve with OTC monistat. She tried oatmeal bath without relief. She took a dose of Azo as well. She has hx genital HSV. She has some itching but more pain in the area of rash. She had similar sx's in past with yeast infections. No f/c. No N/V, abdominal pain, back pain. No numbness or tingling prior to onset of rash. She has used zovirex Rx in past for herpes rash.  Past Medical History:  Diagnosis Date  . Anemia   . Anxiety   . Cholelithiasis   . Endometriosis   . Goiter   . Herpes simplex   . Hyperlipidemia   . Hypertension   . Loss of weight   . Macular degeneration    both eyes  . Nontoxic multinodular goiter   . Other abnormal blood chemistry   . Peptic ulcer   . PUD (peptic ulcer disease)   . Renal cyst   . Sciatica   . Scoliosis (and kyphoscoliosis), idiopathic   . Type II or unspecified type diabetes mellitus without mention of complication, uncontrolled   . Unspecified disorder of kidney and ureter   . Urinary frequency     Past Surgical History:  Procedure Laterality Date  . ABDOMINAL HYSTERECTOMY  1971  . CATARACT EXTRACTION  12/2014    Dr. Rutherford Guys  . REPAIR OF PERFORATED  ULCER  1973 &1975   Dr Vida Rigger  . [place]   removed 1/2 stomach    Patient Care Team: Gayland Curry, DO as PCP - General (Geriatric Medicine) Alden Hipp, MD as Consulting Physician (Obstetrics and Gynecology) Rutherford Guys, MD as Consulting Physician (Ophthalmology)  Social History   Social History  . Marital status: Divorced    Spouse name: N/A  . Number of children: N/A  . Years of education: N/A   Occupational History  . Not on file.   Social History Main Topics  . Smoking status: Former Smoker    Quit date: 01/09/1994  . Smokeless tobacco: Never Used  . Alcohol use No  . Drug use: No  . Sexual activity: Not Currently   Other Topics Concern  . Not on file   Social History Narrative  . No narrative on file     reports that she quit smoking about 22 years ago. She has never used smokeless tobacco. She reports that she does not drink alcohol or use drugs.  Family History  Problem Relation Age of Onset  . Obesity Mother    Family Status  Relation Status  . Mother Deceased at age 49  . Father Deceased at age 44  .  Sister Alive  . Brother Deceased at age 77  . Brother Alive  . Sister Alive     No Known Allergies  Medications: Patient's Medications  New Prescriptions   No medications on file  Previous Medications   ALPRAZOLAM (XANAX) 0.25 MG TABLET    Take 1 tablet (0.25 mg total) by mouth at bedtime as needed for sleep.   BENZOCAINE (ORAJEL) 10 % MUCOSAL GEL    Use as directed 1 application in the mouth or throat as needed for mouth pain.   BYSTOLIC 10 MG TABLET    TAKE 1/2 TABLET BY MOUTH ONCE DAILY   EXFORGE 10-320 MG TABLET    take 1 tablet by mouth once daily  Modified Medications   No medications on file  Discontinued Medications   AMOXICILLIN-CLAVULANATE (AUGMENTIN) 500-125 MG TABLET    Take 1 tablet (500 mg total) by mouth 2 (two) times daily.    Review of Systems  Genitourinary: Positive for dysuria, frequency and  urgency. Negative for vaginal bleeding, vaginal discharge and vaginal pain.  Skin: Positive for rash.  All other systems reviewed and are negative.   Vitals:   10/20/16 0934  BP: 118/74  Pulse: 62  Resp: 17  Temp: 98.7 F (37.1 C)  TempSrc: Oral  SpO2: 98%  Weight: 106 lb 9.6 oz (48.4 kg)  Height: 5\' 4"  (1.626 m)   Body mass index is 18.3 kg/m.  Physical Exam  Constitutional: She is oriented to person, place, and time. She appears well-developed and well-nourished.  Abdominal: Soft. Bowel sounds are normal. She exhibits no distension and no mass. There is no hepatomegaly. There is no tenderness. There is no rebound and no guarding. No hernia.  No suprapubic TTP; no CVAT  Genitourinary:    There is rash (vesicular, clustered with no signs of secondary infection) on the right labia. There is no tenderness or lesion on the right labia. There is rash on the left labia. There is no tenderness or lesion on the left labia.  Neurological: She is alert and oriented to person, place, and time.  Skin: Skin is warm, dry and intact. Rash noted. Rash is vesicular.  Psychiatric: She has a normal mood and affect. Her behavior is normal. Thought content normal.     Labs reviewed: Office Visit on 10/12/2016  Component Date Value Ref Range Status  . Color, UA 10/12/2016 dark yellow   Final  . Clarity, UA 10/12/2016 cloudy   Final  . Glucose, UA 10/12/2016 neg   Final  . Bilirubin, UA 10/12/2016 neg   Final  . Ketones, UA 10/12/2016 neg   Final  . Spec Grav, UA 10/12/2016 1.015  1.010 - 1.025 Final  . Blood, UA 10/12/2016 neg   Final  . pH, UA 10/12/2016 5.0  5.0 - 8.0 Final  . Protein, UA 10/12/2016 neg   Final  . Urobilinogen, UA 10/12/2016 0.2  0.2 or 1.0 E.U./dL Final  . Nitrite, UA 10/12/2016 neg   Final  . Leukocytes, UA 10/12/2016 Trace* Negative Final  . Culture 10/12/2016 ESCHERICHIA COLI   Final   SOURCE: URINE&URINE  . Colony Count 10/12/2016 Greater than 100,000 CFU/mL    Final  . Organism ID, Bacteria 10/12/2016 ESCHERICHIA COLI   Final    No results found.   Assessment/Plan   ICD-10-CM   1. HSV infection likely 2/2 #2 B00.9 acyclovir ointment (ZOVIRAX) 5 %  2. Acute cystitis without hematuria - failing to change as expected N30.00 levofloxacin (LEVAQUIN) 500 MG tablet  Sensitivities of urine cx reviewed - levaquin > cipro/imipenem with best sensitivities  Take levaquin daily x 7 days for UTI  Take probiotic daily while on antibiotic  Use zovirax cream to groin rash 6 times a day for 7 days  continue other medications as ordered  Wash hands frequently to prevent rash from spreading  Avoid sexual contact while rash present  Follow up as scheduled with Dr Lorelei Pont S. Perlie Gold  [facility] [address] [place], [place] [address] [phone] Cell (Monday-Friday 8 AM - 5 PM) [phone] After 5 PM and follow prompts

## 2016-10-21 ENCOUNTER — Other Ambulatory Visit: Payer: Self-pay | Admitting: *Deleted

## 2016-10-21 DIAGNOSIS — F5104 Psychophysiologic insomnia: Secondary | ICD-10-CM

## 2016-10-21 MED ORDER — ALPRAZOLAM 0.25 MG PO TABS: 0.2500 mg | ORAL_TABLET | Freq: Every evening | ORAL | 0 refills | Status: DC | PRN

## 2016-10-21 NOTE — Telephone Encounter (Signed)
[facility] 

## 2016-10-22 ENCOUNTER — Ambulatory Visit: Payer: Medicare Other | Admitting: Neurology

## 2016-10-26 ENCOUNTER — Encounter: Payer: Self-pay | Admitting: Internal Medicine

## 2016-10-26 ENCOUNTER — Ambulatory Visit (INDEPENDENT_AMBULATORY_CARE_PROVIDER_SITE_OTHER): Payer: Medicare Other | Admitting: Internal Medicine

## 2016-10-26 VITALS — BP 128/60 | HR 71 | Temp 98.3°F | Wt 105.0 lb

## 2016-10-26 DIAGNOSIS — N952 Postmenopausal atrophic vaginitis: Secondary | ICD-10-CM | POA: Diagnosis not present

## 2016-10-26 DIAGNOSIS — R350 Frequency of micturition: Secondary | ICD-10-CM

## 2016-10-26 DIAGNOSIS — N3281 Overactive bladder: Secondary | ICD-10-CM | POA: Diagnosis not present

## 2016-10-26 MED ORDER — ESTRADIOL 0.1 MG/GM VA CREA: 1.0000 | TOPICAL_CREAM | VAGINAL | 12 refills | Status: DC

## 2016-10-26 MED ORDER — MIRABEGRON ER 25 MG PO TB24: 25.0000 mg | ORAL_TABLET | Freq: Every day | ORAL | 3 refills | Status: DC

## 2016-10-26 NOTE — Progress Notes (Signed)
Location:  [place]   Place of Service:   Clinic  Provider: Peggie Hornak L. Mariea Clonts, D.O., C.M.D.  Code Status: Full Goals of Care:  Advanced Directives 10/20/2016  Does Patient Have a Medical Advance Directive? No  Would patient like information on creating a medical advance directive? -     Chief Complaint  Patient presents with  . Acute Visit    frequent urination, full bladder, pain and burning.    HPI: Patient is a 81 y.o. female seen today for an acute visit for Urinary tract concerns. Has been having urinary frequency for at least a month. Associated symptoms include painful urination, has had a rash in the groin and on the labia, has used acyclovir with some improvment states that she finds some relief with ice at least temporary. Takes Azo with at least some relief, states that she wouldn't be able to function without it some days. Takes cranberry tablets just for good urinary health. Has also tried witch hazel and oatmeal baths with some temporary relief. Does not wear underwear as a result of her issues. States that she uses the bathroom every 2 hours and has a significant volume.  Has a boyfriend but is currently not having sex due to her symptoms. Was treated with levaquin and augmentin recently for UTI but symptoms remain.   Past Medical History:  Diagnosis Date  . Anemia   . Anxiety   . Cholelithiasis   . Endometriosis   . Goiter   . Herpes simplex   . Hyperlipidemia   . Hypertension   . Loss of weight   . Macular degeneration    both eyes  . Nontoxic multinodular goiter   . Other abnormal blood chemistry   . Peptic ulcer   . PUD (peptic ulcer disease)   . Renal cyst   . Sciatica   . Scoliosis (and kyphoscoliosis), idiopathic   . Type II or unspecified type diabetes mellitus without mention of complication, uncontrolled   . Unspecified disorder of kidney and ureter   . Urinary frequency     Past Surgical History:  Procedure Laterality Date  . ABDOMINAL  HYSTERECTOMY  1971  . CATARACT EXTRACTION  12/2014    Dr. Rutherford Guys  . REPAIR OF PERFORATED ULCER  1973 &1975   Dr Vida Rigger  . STOMACH SURGERY  1977   removed 1/2 stomach    No Known Allergies  Allergies as of 10/26/2016   No Known Allergies     Medication List       Accurate as of 10/26/16  4:20 PM. Always use your most recent med list.          acyclovir ointment 5 % Commonly known as:  ZOVIRAX Apply 1 application topically 6 (six) times daily. Use for 7 days   ALPRAZolam 0.25 MG tablet Commonly known as:  XANAX Take 1 tablet (0.25 mg total) by mouth at bedtime as needed for sleep.   benzocaine 10 % mucosal gel Commonly known as:  ORAJEL Use as directed 1 application in the mouth or throat as needed for mouth pain.   BYSTOLIC 10 MG tablet Generic drug:  nebivolol TAKE 1/2 TABLET BY MOUTH ONCE DAILY   EXFORGE 10-320 MG tablet Generic drug:  amLODipine-valsartan take 1 tablet by mouth once daily       Review of Systems:  Review of Systems  Constitutional: Negative for chills and fever.  Gastrointestinal: Negative for constipation and diarrhea.  Genitourinary: Positive for dysuria, frequency and urgency. Negative  for flank pain and hematuria.  Skin: Positive for rash.    Health Maintenance  Topic Date Due  . FOOT EXAM  10/17/1947  . OPHTHALMOLOGY EXAM  02/17/2015  . MAMMOGRAM  11/01/2015  . HEMOGLOBIN A1C  01/01/2017    Physical Exam: Vitals:   10/26/16 1546  BP: 128/60  Pulse: 71  Temp: 98.3 F (36.8 C)  TempSrc: Oral  SpO2: 98%  Weight: 105 lb (47.6 kg)   Body mass index is 18.02 kg/m. Physical Exam  Constitutional: She appears well-developed and well-nourished.  Abdominal: There is no tenderness.  Genitourinary: There is no rash on the right labia. There is no rash on the left labia. No vaginal discharge found.  Genitourinary Comments: No visible lesions or rash at this time.  Skin: Skin is warm and dry.  Psychiatric: She has a normal  mood and affect.    Labs reviewed: Basic Metabolic Panel:  Recent Labs  07/01/16 0808  NA 138  K 4.6  CL 107  CO2 22  GLUCOSE 100*  BUN 24  CREATININE 1.06*  CALCIUM 9.4  TSH 1.01   Liver Function Tests:  Recent Labs  07/01/16 0808  AST 18  ALT 10  ALKPHOS 89  BILITOT 0.4  PROT 6.7  ALBUMIN 4.1   No results for input(s): LIPASE, AMYLASE in the last 8760 hours. No results for input(s): AMMONIA in the last 8760 hours. CBC:  Recent Labs  07/01/16 0808  WBC 7.9  NEUTROABS 4,108  HGB 10.9*  HCT 34.0*  MCV 83.7  PLT 353   Lipid Panel:  Recent Labs  07/01/16 0808  CHOL 200*  HDL 141  LDLCALC 44  TRIG 76  CHOLHDL 1.4   Lab Results  Component Value Date   HGBA1C 5.9 (H) 07/01/2016    Procedures since last visit: No results found.  Assessment/Plan 1. Overactive bladder Try samples for the next week and see if symptoms improve. Patient to call and update with sx improvement or not. - added to med list, but not ordered today (only samples given for one week)--mirabegron ER (MYRBETRIQ) 25 MG TB24 tablet; Take 1 tablet (25 mg total) by mouth daily.  Dispense: 30 tablet; Refill: 3  2. Urinary frequency Associated with overactive bladder -was going to send culture but had already been disposed of before order written (doubt any residual infection anyway  3. Atrophic vaginitis - try estradiol (ESTRACE VAGINAL) 0.1 MG/GM vaginal cream; Place 1 Applicatorful vaginally 3 (three) times a week.  Dispense: 42.5 g; Refill: 12  Labs/tests ordered:  None Next appt:  01/04/2017  [name] L. Praneel Haisley, D.O. [facility] [address], [place] [address] Cell Phone (Mon-Fri 8am-5pm):  [phone] On Call:  [phone] & follow prompts after 5pm & weekends Office Phone:  [phone] Office Fax:  [phone]

## 2016-10-27 ENCOUNTER — Telehealth: Payer: Self-pay | Admitting: *Deleted

## 2016-10-27 DIAGNOSIS — N3281 Overactive bladder: Secondary | ICD-10-CM

## 2016-10-27 MED ORDER — MIRABEGRON ER 25 MG PO TB24: 25.0000 mg | ORAL_TABLET | Freq: Every day | ORAL | 3 refills | Status: DC

## 2016-10-27 NOTE — Telephone Encounter (Signed)
Patient called and stated that she saw good results with taking the new medication myrbetriq and wants a Rx called in today for it. Patient aware that she is suppose to take for a week to see results but patient wants Rx called into pharmacy today. Rx faxed.

## 2016-11-10 DIAGNOSIS — H353133 Nonexudative age-related macular degeneration, bilateral, advanced atrophic without subfoveal involvement: Secondary | ICD-10-CM | POA: Diagnosis not present

## 2016-11-18 ENCOUNTER — Telehealth: Payer: Self-pay | Admitting: *Deleted

## 2016-11-18 DIAGNOSIS — B372 Candidiasis of skin and nail: Secondary | ICD-10-CM | POA: Diagnosis not present

## 2016-11-18 NOTE — Telephone Encounter (Signed)
Dr. Alden Hipp called wanting to know if the diagnosis of herpes given to this mutual patient was done by culture. He stated that the patient is very upset and wanting to know how she got this virus.

## 2016-11-22 ENCOUNTER — Other Ambulatory Visit: Payer: Self-pay | Admitting: Internal Medicine

## 2016-11-22 DIAGNOSIS — F5104 Psychophysiologic insomnia: Secondary | ICD-10-CM

## 2016-11-23 NOTE — Telephone Encounter (Signed)
rx called into pharmacy

## 2016-11-23 NOTE — Telephone Encounter (Signed)
Ok to fill? Last filled 10/21/16

## 2016-12-18 ENCOUNTER — Other Ambulatory Visit: Payer: Self-pay | Admitting: Internal Medicine

## 2016-12-18 DIAGNOSIS — F5104 Psychophysiologic insomnia: Secondary | ICD-10-CM

## 2016-12-18 NOTE — Telephone Encounter (Signed)
Requested refill too soon, not due until 12/24/2016

## 2016-12-23 ENCOUNTER — Other Ambulatory Visit: Payer: Self-pay | Admitting: Internal Medicine

## 2016-12-23 DIAGNOSIS — F5104 Psychophysiologic insomnia: Secondary | ICD-10-CM

## 2016-12-23 NOTE — Telephone Encounter (Signed)
A refill request for alprazolam 0.25 mg tablet was received from pharmacy. Rx was called in to pharmacy after medication was verified on PMP [employer].

## 2017-01-04 ENCOUNTER — Ambulatory Visit (INDEPENDENT_AMBULATORY_CARE_PROVIDER_SITE_OTHER): Payer: Medicare Other | Admitting: Internal Medicine

## 2017-01-04 ENCOUNTER — Encounter: Payer: Self-pay | Admitting: Internal Medicine

## 2017-01-04 VITALS — BP 136/80 | HR 73 | Temp 98.4°F | Ht 64.0 in | Wt 106.2 lb

## 2017-01-04 DIAGNOSIS — F5104 Psychophysiologic insomnia: Secondary | ICD-10-CM | POA: Diagnosis not present

## 2017-01-04 DIAGNOSIS — R739 Hyperglycemia, unspecified: Secondary | ICD-10-CM

## 2017-01-04 DIAGNOSIS — K644 Residual hemorrhoidal skin tags: Secondary | ICD-10-CM | POA: Diagnosis not present

## 2017-01-04 DIAGNOSIS — N3281 Overactive bladder: Secondary | ICD-10-CM

## 2017-01-04 DIAGNOSIS — I10 Essential (primary) hypertension: Secondary | ICD-10-CM

## 2017-01-04 DIAGNOSIS — E785 Hyperlipidemia, unspecified: Secondary | ICD-10-CM

## 2017-01-04 DIAGNOSIS — E042 Nontoxic multinodular goiter: Secondary | ICD-10-CM

## 2017-01-04 NOTE — Progress Notes (Signed)
Location:  [facility] clinic Provider:  Gerry Heaphy L. Mariea Clonts, D.O., C.M.D.  Code Status: DNR Goals of Care:  Advanced Directives 01/04/2017  Does Patient Have a Medical Advance Directive? No  Would patient like information on creating a medical advance directive? Yes (MAU/Ambulatory/Procedural Areas - Information given)   Chief Complaint  Patient presents with  . Medical Management of Chronic Issues    6 month follow-up , foot exam due. Patient with raised area in anal area- possible hemorrhoid. Patient also c/o cold feeling all the time.   . Medication Refill    No refills needed  . Medication Management    Patient would like to discuss refills on Xanax in the future to avoid delay's in refilling medication   . Health Maintenance    Refused Mammogram and eye exam completed in June (Dr.Shaprio)  . Immunizations    Refused all immunization updates     HPI: Patient is a 81 y.o. female seen today for medical management of chronic diseases.    Spot on her bottom bothersome--possibly a hemorrhoid.  No bleeding.  Using otc hemorrhoid cream.  This one is harder and more defined.  It hurts if she wipes too hard.  Has read about sitz baths.  Using the handheld shower.      Feels cold all of the time.  No abnormal labs.  Discussed more layers due to small stature and aging.  Gyn condition better.  Follows with gyn.  BP at goal with current regimen.  Requesting refills on her xanax b/c she is continuing on this regularly at bedtime as needed for sleep.  She does really well with it.  She has trouble getting to sleep and wants to continue it despite risks as she ages.    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    Goiter:  Thyroid was normal in May.  Past Medical History:  Diagnosis Date  . Anemia   . Anxiety   . Cholelithiasis   . Endometriosis   . Goiter   . Herpes simplex   . Hyperlipidemia   . Hypertension   . Loss of weight   . Macular degeneration    both eyes  . Nontoxic  multinodular goiter   . Other abnormal blood chemistry   . Peptic ulcer   . PUD (peptic ulcer disease)   . Renal cyst   . Sciatica   . Scoliosis (and kyphoscoliosis), idiopathic   . Type II or unspecified type diabetes mellitus without mention of complication, uncontrolled   . Unspecified disorder of kidney and ureter   . Urinary frequency     Past Surgical History:  Procedure Laterality Date  . ABDOMINAL HYSTERECTOMY  1971  . CATARACT EXTRACTION  12/2014    Dr. Rutherford Guys  . REPAIR OF PERFORATED ULCER  1973 &1975   Dr Vida Rigger  . STOMACH SURGERY  1977   removed 1/2 stomach    No Known Allergies  Outpatient Encounter Medications as of 01/04/2017  Medication Sig  . ALPRAZolam (XANAX) 0.25 MG tablet take 1 tablet by mouth at bedtime if needed for sleep  . BYSTOLIC 10 MG tablet TAKE 1/2 TABLET BY MOUTH ONCE DAILY  . EXFORGE 10-320 MG tablet take 1 tablet by mouth once daily  . mirabegron ER (MYRBETRIQ) 25 MG TB24 tablet Take 1 tablet (25 mg total) by mouth daily.  . Multiple Vitamins-Minerals (EYE VITAMINS PO) Take daily by mouth.  . [DISCONTINUED] acyclovir ointment (ZOVIRAX) 5 % Apply 1 application topically 6 (  six) times daily. Use for 7 days  . [DISCONTINUED] benzocaine (ORAJEL) 10 % mucosal gel Use as directed 1 application in the mouth or throat as needed for mouth pain.  . [DISCONTINUED] estradiol (ESTRACE VAGINAL) 0.1 MG/GM vaginal cream Place 1 Applicatorful vaginally 3 (three) times a week.   No facility-administered encounter medications on file as of 01/04/2017.     Review of Systems:  Review of Systems  Constitutional: Negative for chills, fever and malaise/fatigue.  HENT: Positive for congestion and hearing loss.        Off and on  Eyes: Negative for blurred vision.       Glasses  Respiratory: Positive for cough. Negative for shortness of breath.        Off and on with URI  Cardiovascular: Negative for chest pain, palpitations and leg swelling.    Gastrointestinal: Negative for abdominal pain, blood in stool, constipation and melena.  Genitourinary: Positive for frequency and urgency. Negative for dysuria.       Improved with myrbetriq  Musculoskeletal: Negative for falls.  Skin: Negative for itching and rash.       Bump near anus  Neurological: Negative for dizziness and weakness.  Endo/Heme/Allergies: Does not bruise/bleed easily.  Psychiatric/Behavioral: Negative for depression and memory loss. The patient is nervous/anxious and has insomnia.     Health Maintenance  Topic Date Due  . FOOT EXAM  [birth year]17/1945  . HEMOGLOBIN A1C  01/01/2017  . OPHTHALMOLOGY EXAM  07/17/2017  . MAMMOGRAM  Discontinued    Physical Exam: Vitals:   01/04/17 1312  BP: 136/80  Pulse: 73  Temp: 98.4 F (36.9 C)  TempSrc: Oral  SpO2: 97%  Weight: 106 lb 3.2 oz (48.2 kg)  Height: 5\' 4"  (1.626 m)   Body mass index is 18.23 kg/m. Physical Exam  Constitutional: She is oriented to person, place, and time. She appears well-developed and well-nourished. No distress.  HENT:  Head: Normocephalic and atraumatic.  Hearing aids  Eyes:  glasses  Cardiovascular: Normal rate, regular rhythm, normal heart sounds and intact distal pulses.  Pulmonary/Chest: Effort normal and breath sounds normal. No respiratory distress.  Left side rhonchi  Abdominal: Bowel sounds are normal.  Genitourinary:  Genitourinary Comments: Small, hard skin tag on left side of anus  Musculoskeletal: Normal range of motion.  Neurological: She is alert and oriented to person, place, and time. No cranial nerve deficit.  Skin: Skin is warm and dry.  Psychiatric: She has a normal mood and affect.    Labs reviewed: Basic Metabolic Panel: Recent Labs    07/01/16 0808  NA 138  K 4.6  CL 107  CO2 22  GLUCOSE 100*  BUN 24  CREATININE 1.06*  CALCIUM 9.4  TSH 1.01   Liver Function Tests: Recent Labs    07/01/16 0808  AST 18  ALT 10  ALKPHOS 89  BILITOT 0.4  PROT  6.7  ALBUMIN 4.1   No results for input(s): LIPASE, AMYLASE in the last 8760 hours. No results for input(s): AMMONIA in the last 8760 hours. CBC: Recent Labs    07/01/16 0808  WBC 7.9  NEUTROABS 4,108  HGB 10.9*  HCT 34.0*  MCV 83.7  PLT 353   Lipid Panel: Recent Labs    07/01/16 0808  CHOL 200*  HDL 141  LDLCALC 44  TRIG 76  CHOLHDL 1.4   Lab Results  Component Value Date   HGBA1C 5.9 (H) 07/01/2016    Assessment/Plan 1. Essential hypertension, benign - bp at  goal with current therapy, cont same regimen - CBC with Differential/Platelet; Future - COMPLETE METABOLIC PANEL WITH GFR; Future  2. Multinodular goiter - TSH was wnl last check, check annually - TSH; Future  3. Overactive bladder -cont myrbetriq therapy  4. Psychophysiological insomnia -cont low dose xanax  5. Skin tag of anus -new onset, bothersome when sipes, use barrier cream to protect it  6. Hyperglycemia - f/u labs, but has not had problems lately on routine labs - Hemoglobin A1c; Future  7. Hyperlipidemia, unspecified hyperlipidemia type - f/u labs - Lipid panel; Future  Labs/tests ordered:   Orders Placed This Encounter  Procedures  . CBC with Differential/Platelet    Standing Status:   Future    Standing Expiration Date:   01/04/2018  . COMPLETE METABOLIC PANEL WITH GFR    Standing Status:   Future    Standing Expiration Date:   01/04/2018  . Hemoglobin A1c    Standing Status:   Future    Standing Expiration Date:   01/04/2018  . TSH    Standing Status:   Future    Standing Expiration Date:   01/04/2018  . Lipid panel    Standing Status:   Future    Standing Expiration Date:   01/04/2018    Next appt:  6 mos for annual exam, labs before  Aamari West L. Neila Teem, D.O. [facility] [address], [place] [address] Cell Phone (Mon-Fri 8am-5pm):  [phone] On Call:  [phone] & follow prompts after 5pm & weekends Office  Phone:  [phone] Office Fax:  [phone]

## 2017-01-04 NOTE — Patient Instructions (Signed)
A barrier cream might help make the skin tag less bothersome.

## 2017-01-19 ENCOUNTER — Encounter: Payer: Self-pay | Admitting: Nurse Practitioner

## 2017-01-19 ENCOUNTER — Ambulatory Visit (INDEPENDENT_AMBULATORY_CARE_PROVIDER_SITE_OTHER): Payer: Medicare Other | Admitting: Nurse Practitioner

## 2017-01-19 VITALS — BP 128/78 | HR 73 | Temp 98.2°F | Resp 17 | Ht 64.0 in | Wt 106.6 lb

## 2017-01-19 DIAGNOSIS — A6000 Herpesviral infection of urogenital system, unspecified: Secondary | ICD-10-CM

## 2017-01-19 MED ORDER — VALACYCLOVIR HCL 1 G PO TABS: 1000.0000 mg | ORAL_TABLET | Freq: Every day | ORAL | 0 refills | Status: AC

## 2017-01-19 NOTE — Patient Instructions (Signed)
To start valtrex 1000 mg daily for 5 days

## 2017-01-19 NOTE — Progress Notes (Signed)
Careteam: Patient Care Team: Gayland Curry, DO as PCP - General (Geriatric Medicine) Alden Hipp, MD as Consulting Physician (Obstetrics and Gynecology) Rutherford Guys, MD as Consulting Physician (Ophthalmology)  Advanced Directive information Does Patient Have a Medical Advance Directive?: No  No Known Allergies  Chief Complaint  Patient presents with  . Acute Visit    Pt is being seen due a sore area on buttock. Pt was told at last appt that it was a skin tag. Area has become very painful and pt cannot sleep due to pain.      HPI: Patient is a 81 y.o. female seen in the office today due to sore area on buttock. Has gotten increasingly painful since she saw Dr Mariea Clonts 2 weeks ago.  She had a skin tag and not sure if it is this or something different. Question if it is an herpes outbreak.  Has tired not to mess with it but feels like it is in the area of her underwear Very uncomfortable the last 2 nights and does not know what to do. Feels like this may be an herpes outbreak and has an ointment that is generally effective but has not been.    Review of Systems:  Review of Systems  Constitutional: Negative for chills, fever and weight loss.  Respiratory: Negative for shortness of breath.   Cardiovascular: Negative for chest pain and leg swelling.  Gastrointestinal: Negative for abdominal pain and heartburn.  Genitourinary: Negative for dysuria, flank pain, frequency, hematuria and urgency.       Pain in vaginal area  Psychiatric/Behavioral: The patient does not have insomnia.     Past Medical History:  Diagnosis Date  . Anemia   . Anxiety   . Cholelithiasis   . Endometriosis   . Goiter   . Herpes simplex   . Hyperlipidemia   . Hypertension   . Loss of weight   . Macular degeneration    both eyes  . Nontoxic multinodular goiter   . Other abnormal blood chemistry   . Peptic ulcer   . PUD (peptic ulcer disease)   . Renal cyst   . Sciatica   . Scoliosis (and  kyphoscoliosis), idiopathic   . Type II or unspecified type diabetes mellitus without mention of complication, uncontrolled   . Unspecified disorder of kidney and ureter   . Urinary frequency    Past Surgical History:  Procedure Laterality Date  . ABDOMINAL HYSTERECTOMY  1971  . CATARACT EXTRACTION  12/2014    Dr. Rutherford Guys  . REPAIR OF PERFORATED ULCER  1973 &1975   Dr Vida Rigger  . [place]   removed 1/2 stomach   Social History:   reports that she quit smoking about 23 years ago. she has never used smokeless tobacco. She reports that she does not drink alcohol or use drugs.  Family History  Problem Relation Age of Onset  . Obesity Mother     Medications:   Medication List        Accurate as of 01/19/17 11:36 AM. Always use your most recent med list.          ALPRAZolam 0.25 MG tablet Commonly known as:  XANAX take 1 tablet by mouth at bedtime if needed for sleep   BYSTOLIC 10 MG tablet Generic drug:  nebivolol TAKE 1/2 TABLET BY MOUTH ONCE DAILY   EXFORGE 10-320 MG tablet Generic drug:  amLODipine-valsartan take 1 tablet by mouth once daily   EYE VITAMINS  PO   mirabegron ER 25 MG Tb24 tablet Commonly known as:  MYRBETRIQ Take 1 tablet (25 mg total) by mouth daily.        Physical Exam:  Vitals:   01/19/17 1131  BP: 128/78  Pulse: 73  Resp: 17  Temp: 98.2 F (36.8 C)  TempSrc: Oral  SpO2: 98%  Weight: 106 lb 9.6 oz (48.4 kg)  Height: 5\' 4"  (1.626 m)   Body mass index is 18.3 kg/m.  Physical Exam  Constitutional: She is oriented to person, place, and time. She appears well-developed and well-nourished. No distress.  HENT:  Head: Normocephalic and atraumatic.  Hearing aids  Eyes:  glasses  Cardiovascular: Normal rate, regular rhythm, normal heart sounds and intact distal pulses.  Pulmonary/Chest: Effort normal and breath sounds normal. No respiratory distress.  Left side rhonchi  Abdominal: Bowel sounds are normal.    Genitourinary: Vagina normal.  Genitourinary Comments: Small, hard skin tag on left side of anus No lesions noted  Musculoskeletal: Normal range of motion.  Neurological: She is alert and oriented to person, place, and time. No cranial nerve deficit.  Skin: Skin is warm and dry.  Psychiatric: She has a normal mood and affect.    Labs reviewed: Basic Metabolic Panel: Recent Labs    07/01/16 0808  NA 138  K 4.6  CL 107  CO2 22  GLUCOSE 100*  BUN 24  CREATININE 1.06*  CALCIUM 9.4  TSH 1.01   Liver Function Tests: Recent Labs    07/01/16 0808  AST 18  ALT 10  ALKPHOS 89  BILITOT 0.4  PROT 6.7  ALBUMIN 4.1   No results for input(s): LIPASE, AMYLASE in the last 8760 hours. No results for input(s): AMMONIA in the last 8760 hours. CBC: Recent Labs    07/01/16 0808  WBC 7.9  NEUTROABS 4,108  HGB 10.9*  HCT 34.0*  MCV 83.7  PLT 353   Lipid Panel: Recent Labs    07/01/16 0808  CHOL 200*  HDL 141  LDLCALC 44  TRIG 76  CHOLHDL 1.4   TSH: Recent Labs    07/01/16 0808  TSH 1.01   A1C: Lab Results  Component Value Date   HGBA1C 5.9 (H) 07/01/2016     Assessment/Plan 1. Genital herpes simplex type 2 - no lesions seen however due to the sensation of early outbreak will treat with oral medication -valtrex 1000 mg by mouth daily for 5 days sent to pharmacy.    Carlos American. Harle Battiest  [facility] [phone] 8 am - 5 pm) [phone] (after hours)

## 2017-01-20 ENCOUNTER — Other Ambulatory Visit: Payer: Self-pay | Admitting: Internal Medicine

## 2017-01-20 DIAGNOSIS — F5104 Psychophysiologic insomnia: Secondary | ICD-10-CM

## 2017-01-23 ENCOUNTER — Other Ambulatory Visit: Payer: Self-pay | Admitting: Internal Medicine

## 2017-01-23 DIAGNOSIS — F5104 Psychophysiologic insomnia: Secondary | ICD-10-CM

## 2017-02-15 ENCOUNTER — Ambulatory Visit (INDEPENDENT_AMBULATORY_CARE_PROVIDER_SITE_OTHER): Payer: Medicare Other | Admitting: Nurse Practitioner

## 2017-02-15 ENCOUNTER — Encounter: Payer: Self-pay | Admitting: Nurse Practitioner

## 2017-02-15 VITALS — BP 102/68 | HR 82 | Temp 98.9°F | Resp 17 | Ht 64.0 in | Wt 104.0 lb

## 2017-02-15 DIAGNOSIS — M7552 Bursitis of left shoulder: Secondary | ICD-10-CM | POA: Diagnosis not present

## 2017-02-15 MED ORDER — PREDNISONE 10 MG (21) PO TBPK: ORAL_TABLET | ORAL | 0 refills | Status: DC

## 2017-02-15 NOTE — Progress Notes (Signed)
Careteam: Patient Care Team: Gayland Curry, DO as PCP - General (Geriatric Medicine) Alden Hipp, MD as Consulting Physician (Obstetrics and Gynecology) Rutherford Guys, MD as Consulting Physician (Ophthalmology)  Advanced Directive information    No Known Allergies  Chief Complaint  Patient presents with  . Acute Visit    Pt is being seen due to left shoulder/arm pain for last 5 to 6 days. Pt reports she could not move arm last night without severe pain.      HPI: Patient is a 81 y.o. female seen in the office today due to left shoulder/arm pain. Reports 5 days ago she woke up with pain to left shoulder. Woke up in extreme pain. Using benegay and muscle rubs which helped some. Also took a hot shower which helped.  Toss and turns at night, she wakes up and then will be on shoulder. Pain is not constant.  Pain is a 3/10 currently but if she picks up something pain increases to a 9/10 (pain was like this last night). Also taking tylenol of the pain which helps some.  Doing more with her right hand due to the pain. Feeling like its been pulled.  No injury.   Review of Systems:  Review of Systems  Constitutional: Negative for chills, fever and weight loss.  HENT: Negative for tinnitus.   Respiratory: Negative for cough, sputum production and shortness of breath.   Cardiovascular: Negative for chest pain, palpitations and leg swelling.  Musculoskeletal: Positive for joint pain and myalgias. Negative for back pain and falls.  Skin: Negative.   Psychiatric/Behavioral: Negative for depression and memory loss. The patient does not have insomnia.     Past Medical History:  Diagnosis Date  . Anemia   . Anxiety   . Cholelithiasis   . Endometriosis   . Goiter   . Herpes simplex   . Hyperlipidemia   . Hypertension   . Loss of weight   . Macular degeneration    both eyes  . Nontoxic multinodular goiter   . Other abnormal blood chemistry   . Peptic ulcer   . PUD (peptic  ulcer disease)   . Renal cyst   . Sciatica   . Scoliosis (and kyphoscoliosis), idiopathic   . Type II or unspecified type diabetes mellitus without mention of complication, uncontrolled   . Unspecified disorder of kidney and ureter   . Urinary frequency    Past Surgical History:  Procedure Laterality Date  . ABDOMINAL HYSTERECTOMY  1971  . CATARACT EXTRACTION  12/2014    Dr. Rutherford Guys  . REPAIR OF PERFORATED ULCER  1973 &1975   Dr Vida Rigger  . [place]   removed 1/2 stomach   Social History:   reports that she quit smoking about 23 years ago. she has never used smokeless tobacco. She reports that she does not drink alcohol or use drugs.  Family History  Problem Relation Age of Onset  . Obesity Mother     Medications:   Medication List        Accurate as of 02/15/17 12:01 PM. Always use your most recent med list.          ALPRAZolam 0.25 MG tablet Commonly known as:  XANAX take 1 tablet by mouth at bedtime if needed   BYSTOLIC 10 MG tablet Generic drug:  nebivolol TAKE 1/2 TABLET BY MOUTH ONCE DAILY   EXFORGE 10-320 MG tablet Generic drug:  amLODipine-valsartan take 1 tablet by mouth once daily  EYE VITAMINS PO   mirabegron ER 25 MG Tb24 tablet Commonly known as:  MYRBETRIQ Take 1 tablet (25 mg total) by mouth daily.        Physical Exam:  Vitals:   02/15/17 1152  BP: 102/68  Pulse: 82  Resp: 17  Temp: 98.9 F (37.2 C)  TempSrc: Oral  SpO2: 98%  Weight: 104 lb (47.2 kg)  Height: 5\' 4"  (1.626 m)   Body mass index is 17.85 kg/m.  Physical Exam  Constitutional: She is oriented to person, place, and time.  Thin frail female  Cardiovascular: Normal rate, regular rhythm and normal heart sounds.  Pulmonary/Chest: Effort normal and breath sounds normal.  Musculoskeletal: She exhibits no edema.       Right shoulder: She exhibits decreased range of motion, tenderness and pain. She exhibits no swelling, no effusion, no deformity and  no laceration.  Neurological: She is alert and oriented to person, place, and time.  Skin: Skin is warm and dry. Capillary refill takes less than 2 seconds.  Psychiatric: She has a normal mood and affect.    Labs reviewed: Basic Metabolic Panel: Recent Labs    07/01/16 0808  NA 138  K 4.6  CL 107  CO2 22  GLUCOSE 100*  BUN 24  CREATININE 1.06*  CALCIUM 9.4  TSH 1.01   Liver Function Tests: Recent Labs    07/01/16 0808  AST 18  ALT 10  ALKPHOS 89  BILITOT 0.4  PROT 6.7  ALBUMIN 4.1   No results for input(s): LIPASE, AMYLASE in the last 8760 hours. No results for input(s): AMMONIA in the last 8760 hours. CBC: Recent Labs    07/01/16 0808  WBC 7.9  NEUTROABS 4,108  HGB 10.9*  HCT 34.0*  MCV 83.7  PLT 353   Lipid Panel: Recent Labs    07/01/16 0808  CHOL 200*  HDL 141  LDLCALC 44  TRIG 76  CHOLHDL 1.4   TSH: Recent Labs    07/01/16 0808  TSH 1.01   A1C: Lab Results  Component Value Date   HGBA1C 5.9 (H) 07/01/2016     Assessment/Plan 1. Bursitis of left shoulder - predniSONE (STERAPRED UNI-PAK 21 TAB) 10 MG (21) TBPK tablet; Use as directed  Dispense: 21 tablet; Refill: 0 Okay to continue to use tylenol as needed for pain To use ice 20-30 mins 2-3 times daily Cont to use to muscle rubs, do this after ice or heat To notify if pain does not improve or worsens after treatment   Arvell Pulsifer K. Harle Battiest  [facility] Adult Medicine [phone] 8 am - 5 pm) [phone] (after hours)

## 2017-02-15 NOTE — Patient Instructions (Addendum)
To start prednisone taper Okay to continue to use tylenol as needed for pain To use ice 20-30 mins 2-3 times daily Cont to use to muscle rubs, do this after ice or heat To notify if pain does not improve or worsens after treatment    Bursitis Bursitis is when the fluid-filled sac (bursa) that covers and protects a joint is swollen (inflamed). Bursitis is most common near joints, especially the knees, elbows, hips, and shoulders. Follow these instructions at home:  Take medicines only as told by your doctor.  If you were prescribed an antibiotic medicine, finish it all even if you start to feel better.  Rest the affected area as told by your doctor. ? Keep the area raised up. ? Avoid doing things that make the pain worse.  Apply ice to the injured area: ? Place ice in a plastic bag. ? Place a towel between your skin and the bag. ? Leave the ice on for 20 minutes, 2-3 times a day.  Use splints, braces, pads, or walking aids as told by your doctor.  Keep all follow-up visits as told by your doctor. This is important. Contact a doctor if:  You have more pain with home care.  You have a fever.  You have chills. This information is not intended to replace advice given to you by your health care provider. Make sure you discuss any questions you have with your health care provider. Document Released: 07/23/2009 Document Revised: 07/11/2015 Document Reviewed: 04/24/2013 Elsevier Interactive Patient Education  2018 [employer].

## 2017-02-22 ENCOUNTER — Encounter: Payer: Self-pay | Admitting: Nurse Practitioner

## 2017-02-22 ENCOUNTER — Encounter (HOSPITAL_COMMUNITY): Payer: Self-pay

## 2017-02-22 ENCOUNTER — Emergency Department (HOSPITAL_COMMUNITY): Payer: Medicare Other

## 2017-02-22 ENCOUNTER — Inpatient Hospital Stay (HOSPITAL_COMMUNITY)
Admission: EM | Admit: 2017-02-22 | Discharge: 2017-02-26 | DRG: 501 | Disposition: A | Discharge status: Skilled Nursing Facility | Payer: Medicare Other | Attending: Family Medicine | Admitting: Family Medicine

## 2017-02-22 ENCOUNTER — Other Ambulatory Visit: Payer: Self-pay

## 2017-02-22 ENCOUNTER — Ambulatory Visit (INDEPENDENT_AMBULATORY_CARE_PROVIDER_SITE_OTHER): Payer: Medicare Other | Admitting: Nurse Practitioner

## 2017-02-22 VITALS — BP 128/82 | HR 110 | Temp 102.4°F | Resp 17 | Ht 64.0 in | Wt 104.0 lb

## 2017-02-22 DIAGNOSIS — E119 Type 2 diabetes mellitus without complications: Secondary | ICD-10-CM

## 2017-02-22 DIAGNOSIS — Z8711 Personal history of peptic ulcer disease: Secondary | ICD-10-CM | POA: Diagnosis not present

## 2017-02-22 DIAGNOSIS — B955 Unspecified streptococcus as the cause of diseases classified elsewhere: Secondary | ICD-10-CM | POA: Diagnosis present

## 2017-02-22 DIAGNOSIS — F419 Anxiety disorder, unspecified: Secondary | ICD-10-CM

## 2017-02-22 DIAGNOSIS — E785 Hyperlipidemia, unspecified: Secondary | ICD-10-CM | POA: Diagnosis not present

## 2017-02-22 DIAGNOSIS — Z79899 Other long term (current) drug therapy: Secondary | ICD-10-CM | POA: Diagnosis not present

## 2017-02-22 DIAGNOSIS — Z90710 Acquired absence of both cervix and uterus: Secondary | ICD-10-CM

## 2017-02-22 DIAGNOSIS — L02414 Cutaneous abscess of left upper limb: Secondary | ICD-10-CM | POA: Diagnosis present

## 2017-02-22 DIAGNOSIS — B950 Streptococcus, group A, as the cause of diseases classified elsewhere: Secondary | ICD-10-CM | POA: Diagnosis not present

## 2017-02-22 DIAGNOSIS — Z903 Acquired absence of stomach [part of]: Secondary | ICD-10-CM

## 2017-02-22 DIAGNOSIS — A419 Sepsis, unspecified organism: Secondary | ICD-10-CM | POA: Diagnosis not present

## 2017-02-22 DIAGNOSIS — R000 Tachycardia, unspecified: Secondary | ICD-10-CM | POA: Diagnosis not present

## 2017-02-22 DIAGNOSIS — R509 Fever, unspecified: Secondary | ICD-10-CM

## 2017-02-22 DIAGNOSIS — E876 Hypokalemia: Secondary | ICD-10-CM | POA: Diagnosis not present

## 2017-02-22 DIAGNOSIS — M7110 Other infective bursitis, unspecified site: Secondary | ICD-10-CM

## 2017-02-22 DIAGNOSIS — R7881 Bacteremia: Secondary | ICD-10-CM

## 2017-02-22 DIAGNOSIS — Z87891 Personal history of nicotine dependence: Secondary | ICD-10-CM | POA: Diagnosis not present

## 2017-02-22 DIAGNOSIS — M71112 Other infective bursitis, left shoulder: Secondary | ICD-10-CM | POA: Diagnosis not present

## 2017-02-22 DIAGNOSIS — R739 Hyperglycemia, unspecified: Secondary | ICD-10-CM | POA: Diagnosis present

## 2017-02-22 DIAGNOSIS — M25512 Pain in left shoulder: Secondary | ICD-10-CM

## 2017-02-22 DIAGNOSIS — I10 Essential (primary) hypertension: Secondary | ICD-10-CM | POA: Diagnosis present

## 2017-02-22 DIAGNOSIS — F1021 Alcohol dependence, in remission: Secondary | ICD-10-CM

## 2017-02-22 HISTORY — DX: Nonexudative age-related macular degeneration, bilateral, stage unspecified: H35.3130

## 2017-02-22 LAB — COMPREHENSIVE METABOLIC PANEL
ALBUMIN: 2.4 g/dL — AB (ref 3.5–5.0)
ALK PHOS: 138 U/L — AB (ref 38–126)
ALT: 21 U/L (ref 14–54)
AST: 25 U/L (ref 15–41)
Anion gap: 11 (ref 5–15)
BUN: 20 mg/dL (ref 6–20)
CALCIUM: 8.2 mg/dL — AB (ref 8.9–10.3)
CO2: 27 mmol/L (ref 22–32)
CREATININE: 1.15 mg/dL — AB (ref 0.44–1.00)
Chloride: 94 mmol/L — ABNORMAL LOW (ref 101–111)
GFR calc non Af Amer: 44 mL/min — ABNORMAL LOW (ref >60)
GFR, EST AFRICAN AMERICAN: 51 mL/min — AB (ref >60)
GLUCOSE: 171 mg/dL — AB (ref 65–99)
Potassium: 3 mmol/L — ABNORMAL LOW (ref 3.5–5.1)
SODIUM: 132 mmol/L — AB (ref 135–145)
Total Bilirubin: 0.9 mg/dL (ref 0.3–1.2)
Total Protein: 7.1 g/dL (ref 6.5–8.1)

## 2017-02-22 LAB — CBC WITH DIFFERENTIAL/PLATELET
BASOS PCT: 0 %
Basophils Absolute: 0 10*3/uL (ref 0.0–0.1)
EOS ABS: 0 10*3/uL (ref 0.0–0.7)
Eosinophils Relative: 0 %
HCT: 34.9 % — ABNORMAL LOW (ref 36.0–46.0)
Hemoglobin: 11.5 g/dL — ABNORMAL LOW (ref 12.0–15.0)
LYMPHS ABS: 0.7 10*3/uL (ref 0.7–4.0)
Lymphocytes Relative: 2 %
MCH: 26.7 pg (ref 26.0–34.0)
MCHC: 33 g/dL (ref 30.0–36.0)
MCV: 81.2 fL (ref 78.0–100.0)
MONO ABS: 1 10*3/uL (ref 0.1–1.0)
Monocytes Relative: 3 %
Neutro Abs: 31.2 10*3/uL — ABNORMAL HIGH (ref 1.7–7.7)
Neutrophils Relative %: 95 %
Platelets: 385 10*3/uL (ref 150–400)
RBC: 4.3 MIL/uL (ref 3.87–5.11)
RDW: 15.4 % (ref 11.5–15.5)
WBC: 32.9 10*3/uL — ABNORMAL HIGH (ref 4.0–10.5)

## 2017-02-22 LAB — URINALYSIS, ROUTINE W REFLEX MICROSCOPIC
Bacteria, UA: NONE SEEN
Bilirubin Urine: NEGATIVE
GLUCOSE, UA: NEGATIVE mg/dL
Ketones, ur: 20 mg/dL — AB
Leukocytes, UA: NEGATIVE
Nitrite: NEGATIVE
PROTEIN: 100 mg/dL — AB
Specific Gravity, Urine: 1.019 (ref 1.005–1.030)
pH: 5 (ref 5.0–8.0)

## 2017-02-22 LAB — POCT INFLUENZA A/B
INFLUENZA A, POC: NEGATIVE
Influenza B, POC: NEGATIVE

## 2017-02-22 LAB — I-STAT CG4 LACTIC ACID, ED: Lactic Acid, Venous: 1.29 mmol/L (ref 0.5–1.9)

## 2017-02-22 MED ORDER — SODIUM CHLORIDE 0.9 % IV BOLUS (SEPSIS)
1000.0000 mL | Freq: Once | INTRAVENOUS | Status: AC
  Administered 2017-02-22: 1000 mL via INTRAVENOUS

## 2017-02-22 MED ORDER — SODIUM CHLORIDE 0.9 % IV BOLUS (SEPSIS)
500.0000 mL | Freq: Once | INTRAVENOUS | Status: AC
  Administered 2017-02-23: 500 mL via INTRAVENOUS

## 2017-02-22 MED ORDER — ACETAMINOPHEN 325 MG PO TABS
650.0000 mg | ORAL_TABLET | Freq: Once | ORAL | Status: AC
  Administered 2017-02-22: 650 mg via ORAL
  Filled 2017-02-22: qty 2

## 2017-02-22 NOTE — ED Notes (Signed)
Di Kindle, RN assisted patient to the restroom and back to triage 4. Patient had a steady gait with assistance.

## 2017-02-22 NOTE — ED Notes (Signed)
Bed: WA22 Expected date:  Expected time:  Means of arrival:  Comments: Triage 4 

## 2017-02-22 NOTE — Progress Notes (Signed)
Careteam: Patient Care Team: Gayland Curry, DO as PCP - General (Geriatric Medicine) Alden Hipp, MD as Consulting Physician (Obstetrics and Gynecology) Rutherford Guys, MD as Consulting Physician (Ophthalmology)  Advanced Directive information    No Known Allergies  Chief Complaint  Patient presents with  . Acute Visit    Pt is being seen for on going pain from bursitis of left shoulder. Pt states that she also feels very fatiqued, has had severe cold chills, and a white film covering her tongue for 3 days. Pt currently has a fever of 100.2      HPI: Patient is a 82 y.o. female seen in the office today due to ongoing pain in her left shoulder. She was seen last week for acute pain. No injury noted. At that time there was no heat or swelling noted. She reports she has been trying to move the left shoulder but pain and decrease mobility persist. No hx of shoulder injury or pain. Pain is no better on prednisone- conts to have to hold arm a certain way.  Very frustrated due to the decrease in mobility. Reports she is in a lot of pain.  Increase fatigue over the last 2 weeks but significantly worse today.  No headaches, Mouth is dry and tongue was coated (brushed this off) but no sore throat but dry throat. No nasal or chest congestion No body aches but feels poorly. Increased fatigue, just wants to sleep all the time.  No shortness of breath or cough.  No dysuria.  Decrease appetite and minimal hydration but has been trying to focus on hydration over the last few days.  Fever noted today unsure of how long it has been going on for.   Review of Systems:  Review of Systems  Constitutional: Positive for chills, fever and malaise/fatigue.  HENT: Negative for congestion, ear discharge, ear pain, hearing loss and sore throat.   Respiratory: Negative for cough, sputum production, shortness of breath and wheezing.   Cardiovascular: Negative for chest pain and palpitations.    Musculoskeletal: Positive for joint pain and myalgias.  Neurological: Positive for weakness. Negative for dizziness and headaches.       Feels like her head is in a fog    Past Medical History:  Diagnosis Date  . Anemia   . Anxiety   . Cholelithiasis   . Endometriosis   . Goiter   . Herpes simplex   . Hyperlipidemia   . Hypertension   . Loss of weight   . Macular degeneration    both eyes  . Nontoxic multinodular goiter   . Other abnormal blood chemistry   . Peptic ulcer   . PUD (peptic ulcer disease)   . Renal cyst   . Sciatica   . Scoliosis (and kyphoscoliosis), idiopathic   . Type II or unspecified type diabetes mellitus without mention of complication, uncontrolled   . Unspecified disorder of kidney and ureter   . Urinary frequency    Past Surgical History:  Procedure Laterality Date  . ABDOMINAL HYSTERECTOMY  1971  . CATARACT EXTRACTION  12/2014    Dr. Rutherford Guys  . REPAIR OF PERFORATED ULCER  1973 &1975   Dr Vida Rigger  . [place]   removed 1/2 stomach   Social History:   reports that she quit smoking about 23 years ago. she has never used smokeless tobacco. She reports that she does not drink alcohol or use drugs.  Family History  Problem Relation Age  of Onset  . Obesity Mother     Medications:   Medication List        Accurate as of 02/22/17  2:27 PM. Always use your most recent med list.          ALPRAZolam 0.25 MG tablet Commonly known as:  XANAX take 1 tablet by mouth at bedtime if needed   BYSTOLIC 10 MG tablet Generic drug:  nebivolol TAKE 1/2 TABLET BY MOUTH ONCE DAILY   EXFORGE 10-320 MG tablet Generic drug:  amLODipine-valsartan take 1 tablet by mouth once daily   EYE VITAMINS PO        Physical Exam:  Vitals:   02/22/17 1421  BP: 128/82  Pulse: (!) 110  Resp: 17  Temp: (!) 102.4 F (39.1 C)  TempSrc: Oral  SpO2: 99%  Weight: 104 lb (47.2 kg)  Height: 5\' 4"  (1.626 m)   Body mass index is 17.85  kg/m.  Physical Exam  Constitutional: She is oriented to person, place, and time.  Thin frail female, appears ill and uncomfortable   HENT:  Head: Normocephalic and atraumatic.  Nose: Nose normal.  Mouth/Throat: Oropharynx is clear and moist.  Dry MM  Eyes: Conjunctivae and EOM are normal. Pupils are equal, round, and reactive to light.  Cardiovascular: Regular rhythm. Tachycardia present.  Pulmonary/Chest: Effort normal and breath sounds normal.  Musculoskeletal: She exhibits edema and tenderness.       Right shoulder: She exhibits decreased range of motion, tenderness, swelling, effusion and decreased strength.  Shoulder now red, warm and very tender to touch.  No open areas noted to skin  Neurological: She is alert and oriented to person, place, and time.  Skin: Skin is warm and dry.  Psychiatric: Her speech is delayed. She is slowed.    Labs reviewed: Basic Metabolic Panel: Recent Labs    07/01/16 0808  NA 138  K 4.6  CL 107  CO2 22  GLUCOSE 100*  BUN 24  CREATININE 1.06*  CALCIUM 9.4  TSH 1.01   Liver Function Tests: Recent Labs    07/01/16 0808  AST 18  ALT 10  ALKPHOS 89  BILITOT 0.4  PROT 6.7  ALBUMIN 4.1   No results for input(s): LIPASE, AMYLASE in the last 8760 hours. No results for input(s): AMMONIA in the last 8760 hours. CBC: Recent Labs    07/01/16 0808  WBC 7.9  NEUTROABS 4,108  HGB 10.9*  HCT 34.0*  MCV 83.7  PLT 353   Lipid Panel: Recent Labs    07/01/16 0808  CHOL 200*  HDL 141  LDLCALC 44  TRIG 76  CHOLHDL 1.4   TSH: Recent Labs    07/01/16 0808  TSH 1.01   A1C: Lab Results  Component Value Date   HGBA1C 5.9 (H) 07/01/2016     Assessment/Plan 1. Acute pain of left shoulder -flu swab negative  2. Fever, unspecified fever cause -suspect sepsis in left shoulder at this time. pt now with fever of 102.4, red, swollen, tender shoulder, pt with dry MM, skin tenting, tachycardia and reorts of a head "fog" therefore  will send to ED via for further evaluation and treatment.    Carlos American. Harle Battiest  [facility] [phone] 8 am - 5 pm) [phone] (after hours)

## 2017-02-22 NOTE — ED Provider Notes (Addendum)
[facility] DEPT Provider Note   CSN: [id number] Arrival date & time: 02/22/17  1538    History   Chief Complaint Chief Complaint  Patient presents with  . Fever  . Joint Pain    HPI [name] is a 82 y.o. female.  82 year old immunocompetent female with a history of diabetes, hypertension, dyslipidemia presents to the emergency department for evaluation of left shoulder pain. 2 days after Christmas, the patient was awoken suddenly of a severe pain in her left shoulder. This was associated with limited range of motion; pain worsened with shoulder mobility. She saw her primary care doctor who put her on a prednisone taper. She discontinued this taper after 5 days as she did not believe it was helping her. She has begun to feel more fatigued with malaise and had chills. She saw her doctor today and was noted to be febrile with a temperature of 102.26F. PCP noted that the left shoulder appeared more erythematous and warm to the touch. There was outpatient concern for septic joint. Patient denies any history of septic joint or any trauma, fall, injury which could have incited her symptoms. She is right-hand dominant. The patient reports feeling some improvement after being given Tylenol in triage. She has had some intermittent paresthesias which resolved when she "rubs them out". She denies any numbness or significant weakness. She reports some subjective improvement in her left shoulder mobility, but still has limited range of motion. She denies any other possible cause for her fever. No congestion, cough, chest pain, abdominal pain, nausea, vomiting. She reports having a negative flu swab at her primary care office.      Past Medical History:  Diagnosis Date  . Anemia   . Anxiety   . Cholelithiasis   . Endometriosis   . Goiter   . Herpes simplex   . Hyperlipidemia   . Hypertension   . Loss of weight   . Macular degeneration    both eyes  . Nontoxic  multinodular goiter   . Other abnormal blood chemistry   . Peptic ulcer   . PUD (peptic ulcer disease)   . Renal cyst   . Sciatica   . Scoliosis (and kyphoscoliosis), idiopathic   . Type II or unspecified type diabetes mellitus without mention of complication, uncontrolled   . Unspecified disorder of kidney and ureter   . Urinary frequency     Patient Active Problem List   Diagnosis Date Noted  . Insomnia 07/01/2015  . Genital herpes simplex type 2 11/03/2014  . Hyperlipidemia with target LDL less than 100 05/23/2012  . Hyperglycemia 05/23/2012  . Multinodular goiter 05/23/2012  . Essential hypertension, benign 05/23/2012    Past Surgical History:  Procedure Laterality Date  . ABDOMINAL HYSTERECTOMY  1971  . CATARACT EXTRACTION  12/2014    Dr. Rutherford Guys  . REPAIR OF PERFORATED ULCER  1973 &1975   Dr Vida Rigger  . STOMACH SURGERY  1977   removed 1/2 stomach    OB History    No data available       Home Medications    Prior to Admission medications   Medication Sig Start Date End Date Taking? Authorizing Provider  acetaminophen (TYLENOL) 500 MG tablet Take 1,000 mg by mouth every 6 (six) hours as needed for mild pain.   Yes [provider]  BYSTOLIC 10 MG tablet TAKE 1/2 TABLET BY MOUTH ONCE DAILY 08/06/16  Yes Reed, Tiffany L, DO  EXFORGE 10-320 MG tablet  take 1 tablet by mouth once daily 07/08/16  Yes Reed, Tiffany L, DO  ALPRAZolam Duanne Moron) 0.25 MG tablet take 1 tablet by mouth at bedtime if needed Patient not taking: Reported on 02/22/2017 01/25/17   Gayland Curry, DO    Family History Family History  Problem Relation Age of Onset  . Obesity Mother     Social History Social History   Tobacco Use  . Smoking status: Former Smoker    Last attempt to quit: 01/09/1994    Years since quitting: 23.1  . Smokeless tobacco: Never Used  Substance Use Topics  . Alcohol use: No  . Drug use: No     Allergies   Patient has no known allergies.   Review  of Systems Review of Systems Ten systems reviewed and are negative for acute change, except as noted in the HPI.    Physical Exam Updated Vital Signs BP (!) 119/59 (BP Location: Right Arm)   Pulse 86   Temp 97.9 F (36.6 C) (Oral)   Resp 16   Ht 5\' 4"  (1.626 m)   Wt 47.2 kg (104 lb)   SpO2 98%   BMI 17.85 kg/m   Physical Exam  Constitutional: She is oriented to person, place, and time. She appears well-developed and well-nourished. No distress.  Chronically thin/frail appearing. Nontoxic.  HENT:  Head: Normocephalic and atraumatic.  Eyes: Conjunctivae and EOM are normal. No scleral icterus.  Neck: Normal range of motion.  Cardiovascular: Normal rate, regular rhythm and intact distal pulses.  Distal radial pulse 2+ in BUE  Pulmonary/Chest: Effort normal. No stridor. No respiratory distress.  Respirations even and unlabored  Musculoskeletal:       Left shoulder: She exhibits decreased range of motion, tenderness, swelling, effusion and pain. She exhibits no deformity and normal pulse.  Decreased ROM of the left shoulder with abduction past 30 as well as forward forward flexion.  Neurological: She is alert and oriented to person, place, and time. She exhibits normal muscle tone. Coordination normal.  Sensation to light touch intact. Grip strength 5/5 bilaterally.  Skin: Skin is warm and dry. No rash noted. She is not diaphoretic. No erythema. No pallor.  Psychiatric: She has a normal mood and affect. Her behavior is normal.  Nursing note and vitals reviewed.    ED Treatments / Results  Labs (all labs ordered are listed, but only abnormal results are displayed) Labs Reviewed  COMPREHENSIVE METABOLIC PANEL - Abnormal; Notable for the following components:      Result Value   Sodium 132 (*)    Potassium 3.0 (*)    Chloride 94 (*)    Glucose, Bld 171 (*)    Creatinine, Ser 1.15 (*)    Calcium 8.2 (*)    Albumin 2.4 (*)    Alkaline Phosphatase 138 (*)    GFR calc non Af  Amer 44 (*)    GFR calc Af Amer 51 (*)    All other components within normal limits  CBC WITH DIFFERENTIAL/PLATELET - Abnormal; Notable for the following components:   WBC 32.9 (*)    Hemoglobin 11.5 (*)    HCT 34.9 (*)    Neutro Abs 31.2 (*)    All other components within normal limits  URINALYSIS, ROUTINE W REFLEX MICROSCOPIC - Abnormal; Notable for the following components:   APPearance HAZY (*)    Hgb urine dipstick SMALL (*)    Ketones, ur 20 (*)    Protein, ur 100 (*)    Squamous  Epithelial / LPF 0-5 (*)    All other components within normal limits  CULTURE, BLOOD (ROUTINE X 2)  CULTURE, BLOOD (ROUTINE X 2)  BODY FLUID CULTURE  GRAM STAIN  I-STAT CG4 LACTIC ACID, ED    EKG  EKG Interpretation None       Radiology Ct Shoulder Left Wo Contrast  Result Date: 02/23/2017 CLINICAL DATA:  82 year old female with left shoulder pain. No known injury. EXAM: CT OF THE UPPER LEFT EXTREMITY WITHOUT CONTRAST TECHNIQUE: Multidetector CT imaging of the upper left extremity was performed according to the standard protocol. COMPARISON:  Radiographs of the left shoulder from earlier on the same day. FINDINGS: Bones/Joint/Cartilage No acute fracture is identified of the left shoulder. Slight dorsal subluxation of the humeral head relative to the glenoid likely from mass effect from adjacent presumed large periarticular/ bursal fluid collection. No suspicious osseous lesion is seen. Ligaments Suboptimally assessed by CT. Muscles and Tendons Overlying the subscapularis muscle is a masslike hypodensity suspicious for tracking of what may represent a large subacromial and subdeltoid bursal fluid collection. This measures approximately 7.7 x 3.9 x 6.6 cm on series 7, image 35 and coronal image 23. This finding may represent stigmata of a large bursitis. Intramuscular mass cannot be entirely excluded given limitations of a noncontrast study. Crescentic subtle hypodensity deep to the deltoid muscle is  also noted on the coronal oblique and sagittal views causing soft tissue prominence and swelling. There is also likely reflects stigmata of a large sub deltoid and subacromial bursal fluid collection. Non emergent MRI without and with contrast would help further correlation and to exclude intrinsic masses or adenopathy involving or adjacent to the subscapularis. IMPRESSION: 1. Lack of intravenous contrast limits assessment. Subtle hypodensity overlies the subscapularis tendon believed to represent continuation of a large subacromial and subdeltoid bursal fluid collection likely from enlarged bursitis. This measures approximately 7.7 x 3.9 x 6.6 cm. Alternatively, subacute intramuscular hematoma without active hemorrhage might have a similar appearance appear An eccentric intramuscular mass of the subscapularis, or potentially adjacent adenopathy is not entirely excluded but believed less likely. Subtle low attenuation favors fluid. Findings may represent stigmata of a large subacromial and subdeltoid bursitis. Suggest non emergent MRI for better assessment however. 2. Soft tissue thickening likely related to tracking of fluid within the subdeltoid bursa as well. 3. No acute osseous abnormality of the left shoulder. No suspicious osseous lesions. Electronically Signed   By: Ashley Royalty M.D.   On: 02/23/2017 00:16   Dg Shoulder Left  Result Date: 02/22/2017 CLINICAL DATA:  Progressive left shoulder pain for 1 week. Fever. Possible septic joint. EXAM: LEFT SHOULDER - 2+ VIEW COMPARISON:  None. FINDINGS: The bones appear mildly demineralized. No evidence of acute fracture, dislocation or bone destruction. Mild inferior subluxation of the humeral head relative to the glenoid may indicate the presence of a joint effusion. There is possible soft tissue swelling around the shoulder. Aortic atherosclerosis noted. Mild midthoracic compression deformity, age indeterminate. IMPRESSION: Suspected joint effusion and  periarticular soft tissue swelling; joint aspiration recommended if clinical concern of septic joint. No acute osseous findings or signs of osteomyelitis. Electronically Signed   By: Richardean Sale M.D.   On: 02/22/2017 18:16    Procedures Aspiration of blood/fluid Date/Time: 02/23/2017 1:19 AM Performed by: Antonietta Breach, PA-C Authorized by: Antonietta Breach, PA-C  Consent: The procedure was performed in an emergent situation. Verbal consent obtained. Risks and benefits: risks, benefits and alternatives were discussed Consent given by: patient Patient  understanding: patient states understanding of the procedure being performed Patient consent: the patient's understanding of the procedure matches consent given Procedure consent: procedure consent matches procedure scheduled Test results: test results available and properly labeled Site marked: the operative site was marked Imaging studies: imaging studies available Required items: required blood products, implants, devices, and special equipment available Patient identity confirmed: verbally with patient and arm band Time out: Immediately prior to procedure a "time out" was called to verify the correct patient, procedure, equipment, support staff and site/side marked as required. Local anesthesia used: no  Anesthesia: Local anesthesia used: no  Sedation: Patient sedated: no  Patient tolerance: Patient tolerated the procedure well with no immediate complications Comments: Aspiration of bursa fluid following marking of site under US guidance. Aspirated fluid cloudy, pale yellow. No gross blood. Patient tolerated well without immediate complications.    (including critical care time)  Medications Ordered in ED Medications  vancomycin (VANCOCIN) IVPB 1000 mg/200 mL premix (not administered)  acetaminophen (TYLENOL) tablet 650 mg (650 mg Oral Given 02/22/17 2013)  sodium chloride 0.9 % bolus 1,000 mL (0 mLs Intravenous Stopped 02/22/17 2310)      And  sodium chloride 0.9 % bolus 500 mL (0 mLs Intravenous Stopped 02/23/17 0025)  acetaminophen (TYLENOL) tablet 650 mg (650 mg Oral Given 02/23/17 0122)    10:45 PM Here remains underlying concern for septic left shoulder joint. Patient noted to be febrile on an outpatient basis to 102.13F with tachycardia. She is tachycardic in triage with a temperature of 100.13F. This improved following Tylenol. Leukocytosis of 32.9 is consistent with likely acute infection. This may be increasingly elevated secondary to recent course of steroids. Lactate reassuring.  Weight-based fluids ordered. Plan for treatment with antibiotics; will discuss potential joint aspiration with MD prior to initiation of this.   CRITICAL CARE Performed by: Antonietta Breach   Total critical care time: 35 minutes  Critical care time was exclusive of separately billable procedures and treating other patients.  Critical care was necessary to treat or prevent imminent or life-threatening deterioration.  Critical care was time spent personally by me on the following activities: development of treatment plan with patient and/or surrogate as well as nursing, discussions with consultants, evaluation of patient's response to treatment, examination of patient, obtaining history from patient or surrogate, ordering and performing treatments and interventions, ordering and review of laboratory studies, ordering and review of radiographic studies, pulse oximetry and re-evaluation of patient's condition.   Initial Impression / Assessment and Plan / ED Course  I have reviewed the triage vital signs and the nursing notes.  Pertinent labs & imaging results that were available during my care of the patient were reviewed by me and considered in my medical decision making (see chart for details).     82 year old female presents to the emergency department for evaluation of possible septic left shoulder joint. This is favored to be infected  bursitis given anterior localization of effusion with very limited, but preserved range of motion of the left shoulder. Patient neurovascularly intact on exam. She is nontoxic appearing. Fever and tachycardia have improved following Tylenol.  A noncontrasted CT was obtained which favors large subacromial and subdeltoid bursitis. Fluid was able to be aspirated from the bursa which was cloudy and pale yellow in appearance consistent with likely infection. Vancomycin was initiated for coverage pending Gram stain and fluid culture. Plan for admission for additional IV antibiotics. Anticipate orthopedic consultation in the morning as there is no current indication for emergent  consultation. Case discussed with Dr. Alcario Drought who will admit.   Final Clinical Impressions(s) / ED Diagnoses   Final diagnoses:  Sepsis, due to unspecified organism [facility])  Other infective bursitis, left shoulder    ED Discharge Orders    None       Beverely Pace 02/23/17 7282    Jola Schmidt, MD 02/23/17 0804    Antonietta Breach, PA-C 03/15/17 0601    Jola Schmidt, MD 03/15/17 2200

## 2017-02-22 NOTE — ED Triage Notes (Signed)
Per EMS-states was being seen at PCP for shoulder pain-was seen last week for same issue and there for follow up today-states pain was getting worse-office got a temp of 102.4-oral temp in ED 100.7-sent here for further eval, possible septic joint-18g in right AC-BP 148/70, HR 104

## 2017-02-23 ENCOUNTER — Inpatient Hospital Stay (HOSPITAL_COMMUNITY): Payer: Medicare Other | Admitting: Critical Care Medicine

## 2017-02-23 ENCOUNTER — Encounter (HOSPITAL_COMMUNITY): Payer: Self-pay | Admitting: Internal Medicine

## 2017-02-23 ENCOUNTER — Encounter (HOSPITAL_COMMUNITY)
Admission: EM | Disposition: A | Discharge status: Skilled Nursing Facility | Payer: Self-pay | Source: Home / Self Care | Attending: Family Medicine

## 2017-02-23 DIAGNOSIS — Z79899 Other long term (current) drug therapy: Secondary | ICD-10-CM | POA: Diagnosis not present

## 2017-02-23 DIAGNOSIS — R7881 Bacteremia: Secondary | ICD-10-CM | POA: Diagnosis not present

## 2017-02-23 DIAGNOSIS — Z87891 Personal history of nicotine dependence: Secondary | ICD-10-CM | POA: Diagnosis not present

## 2017-02-23 DIAGNOSIS — Z903 Acquired absence of stomach [part of]: Secondary | ICD-10-CM | POA: Diagnosis not present

## 2017-02-23 DIAGNOSIS — M71112 Other infective bursitis, left shoulder: Principal | ICD-10-CM

## 2017-02-23 DIAGNOSIS — M419 Scoliosis, unspecified: Secondary | ICD-10-CM | POA: Diagnosis not present

## 2017-02-23 DIAGNOSIS — E119 Type 2 diabetes mellitus without complications: Secondary | ICD-10-CM | POA: Diagnosis not present

## 2017-02-23 DIAGNOSIS — I10 Essential (primary) hypertension: Secondary | ICD-10-CM | POA: Diagnosis not present

## 2017-02-23 DIAGNOSIS — L0291 Cutaneous abscess, unspecified: Secondary | ICD-10-CM | POA: Diagnosis not present

## 2017-02-23 DIAGNOSIS — B999 Unspecified infectious disease: Secondary | ICD-10-CM | POA: Diagnosis not present

## 2017-02-23 DIAGNOSIS — R350 Frequency of micturition: Secondary | ICD-10-CM | POA: Diagnosis not present

## 2017-02-23 DIAGNOSIS — H3530 Unspecified macular degeneration: Secondary | ICD-10-CM | POA: Diagnosis not present

## 2017-02-23 DIAGNOSIS — M71012 Abscess of bursa, left shoulder: Secondary | ICD-10-CM | POA: Diagnosis not present

## 2017-02-23 DIAGNOSIS — Z48817 Encounter for surgical aftercare following surgery on the skin and subcutaneous tissue: Secondary | ICD-10-CM | POA: Diagnosis not present

## 2017-02-23 DIAGNOSIS — Z90710 Acquired absence of both cervix and uterus: Secondary | ICD-10-CM | POA: Diagnosis not present

## 2017-02-23 DIAGNOSIS — L02414 Cutaneous abscess of left upper limb: Secondary | ICD-10-CM | POA: Diagnosis not present

## 2017-02-23 DIAGNOSIS — E7251 Non-ketotic hyperglycinemia: Secondary | ICD-10-CM | POA: Diagnosis not present

## 2017-02-23 DIAGNOSIS — A419 Sepsis, unspecified organism: Secondary | ICD-10-CM | POA: Diagnosis not present

## 2017-02-23 DIAGNOSIS — B955 Unspecified streptococcus as the cause of diseases classified elsewhere: Secondary | ICD-10-CM | POA: Diagnosis not present

## 2017-02-23 DIAGNOSIS — R739 Hyperglycemia, unspecified: Secondary | ICD-10-CM | POA: Diagnosis not present

## 2017-02-23 DIAGNOSIS — B950 Streptococcus, group A, as the cause of diseases classified elsewhere: Secondary | ICD-10-CM | POA: Diagnosis not present

## 2017-02-23 DIAGNOSIS — M6281 Muscle weakness (generalized): Secondary | ICD-10-CM | POA: Diagnosis not present

## 2017-02-23 DIAGNOSIS — F419 Anxiety disorder, unspecified: Secondary | ICD-10-CM | POA: Diagnosis not present

## 2017-02-23 DIAGNOSIS — F1021 Alcohol dependence, in remission: Secondary | ICD-10-CM | POA: Diagnosis present

## 2017-02-23 DIAGNOSIS — M7110 Other infective bursitis, unspecified site: Secondary | ICD-10-CM | POA: Diagnosis not present

## 2017-02-23 DIAGNOSIS — Z8711 Personal history of peptic ulcer disease: Secondary | ICD-10-CM | POA: Diagnosis not present

## 2017-02-23 DIAGNOSIS — E785 Hyperlipidemia, unspecified: Secondary | ICD-10-CM | POA: Diagnosis not present

## 2017-02-23 DIAGNOSIS — R488 Other symbolic dysfunctions: Secondary | ICD-10-CM | POA: Diagnosis not present

## 2017-02-23 DIAGNOSIS — E876 Hypokalemia: Secondary | ICD-10-CM | POA: Diagnosis present

## 2017-02-23 HISTORY — PX: IRRIGATION AND DEBRIDEMENT ABSCESS: SHX5252

## 2017-02-23 LAB — SYNOVIAL CELL COUNT + DIFF, W/ CRYSTALS
EOSINOPHILS-SYNOVIAL: 0 % (ref 0–1)
LYMPHOCYTES-SYNOVIAL FLD: 2 % (ref 0–20)
MONOCYTE-MACROPHAGE-SYNOVIAL FLUID: 8 % — AB (ref 50–90)
Neutrophil, Synovial: 90 % — ABNORMAL HIGH (ref 0–25)
WBC, SYNOVIAL: 447000 /mm3 — AB (ref 0–200)

## 2017-02-23 LAB — BLOOD CULTURE ID PANEL (REFLEXED)
ACINETOBACTER BAUMANNII: NOT DETECTED
CANDIDA ALBICANS: NOT DETECTED
CANDIDA GLABRATA: NOT DETECTED
CANDIDA KRUSEI: NOT DETECTED
Candida parapsilosis: NOT DETECTED
Candida tropicalis: NOT DETECTED
ENTEROBACTER CLOACAE COMPLEX: NOT DETECTED
ESCHERICHIA COLI: NOT DETECTED
Enterobacteriaceae species: NOT DETECTED
Enterococcus species: NOT DETECTED
Haemophilus influenzae: NOT DETECTED
Klebsiella oxytoca: NOT DETECTED
Klebsiella pneumoniae: NOT DETECTED
Listeria monocytogenes: NOT DETECTED
Neisseria meningitidis: NOT DETECTED
PROTEUS SPECIES: NOT DETECTED
PSEUDOMONAS AERUGINOSA: NOT DETECTED
STAPHYLOCOCCUS SPECIES: NOT DETECTED
STREPTOCOCCUS PNEUMONIAE: NOT DETECTED
STREPTOCOCCUS PYOGENES: DETECTED — AB
Serratia marcescens: NOT DETECTED
Staphylococcus aureus (BCID): NOT DETECTED
Streptococcus agalactiae: NOT DETECTED
Streptococcus species: DETECTED — AB

## 2017-02-23 LAB — BASIC METABOLIC PANEL
Anion gap: 8 (ref 5–15)
BUN: 19 mg/dL (ref 6–20)
CALCIUM: 7.8 mg/dL — AB (ref 8.9–10.3)
CO2: 26 mmol/L (ref 22–32)
CREATININE: 1.04 mg/dL — AB (ref 0.44–1.00)
Chloride: 99 mmol/L — ABNORMAL LOW (ref 101–111)
GFR, EST AFRICAN AMERICAN: 58 mL/min — AB (ref >60)
GFR, EST NON AFRICAN AMERICAN: 50 mL/min — AB (ref >60)
GLUCOSE: 150 mg/dL — AB (ref 65–99)
Potassium: 2.7 mmol/L — CL (ref 3.5–5.1)
Sodium: 133 mmol/L — ABNORMAL LOW (ref 135–145)

## 2017-02-23 LAB — CBC
HCT: 33.8 % — ABNORMAL LOW (ref 36.0–46.0)
Hemoglobin: 10.9 g/dL — ABNORMAL LOW (ref 12.0–15.0)
MCH: 26.4 pg (ref 26.0–34.0)
MCHC: 32.2 g/dL (ref 30.0–36.0)
MCV: 81.8 fL (ref 78.0–100.0)
PLATELETS: 404 10*3/uL — AB (ref 150–400)
RBC: 4.13 MIL/uL (ref 3.87–5.11)
RDW: 15.4 % (ref 11.5–15.5)
WBC: 25.2 10*3/uL — ABNORMAL HIGH (ref 4.0–10.5)

## 2017-02-23 LAB — POCT I-STAT 4, (NA,K, GLUC, HGB,HCT)
Glucose, Bld: 116 mg/dL — ABNORMAL HIGH (ref 65–99)
HCT: 34 % — ABNORMAL LOW (ref 36.0–46.0)
HEMOGLOBIN: 11.6 g/dL — AB (ref 12.0–15.0)
Potassium: 3.4 mmol/L — ABNORMAL LOW (ref 3.5–5.1)
Sodium: 137 mmol/L (ref 135–145)

## 2017-02-23 SURGERY — IRRIGATION AND DEBRIDEMENT ABSCESS
Anesthesia: General | Site: Shoulder | Laterality: Left

## 2017-02-23 MED ORDER — DEXTROSE 5 % IV SOLN
2.0000 g | INTRAVENOUS | Status: DC
  Administered 2017-02-23: 2 g via INTRAVENOUS
  Filled 2017-02-23 (×2): qty 2

## 2017-02-23 MED ORDER — DEXTROSE 5 % IV SOLN: 1.0000 g | Freq: Every day | INTRAVENOUS | Status: DC

## 2017-02-23 MED ORDER — POTASSIUM CHLORIDE 10 MEQ/100ML IV SOLN
10.0000 meq | INTRAVENOUS | Status: AC
  Administered 2017-02-23 (×2): 10 meq via INTRAVENOUS
  Filled 2017-02-23 (×2): qty 100

## 2017-02-23 MED ORDER — ONDANSETRON HCL 4 MG PO TABS: 4.0000 mg | ORAL_TABLET | Freq: Four times a day (QID) | ORAL | Status: DC | PRN

## 2017-02-23 MED ORDER — SODIUM CHLORIDE 0.9 % IV SOLN
INTRAVENOUS | Status: DC
  Administered 2017-02-23 (×2): via INTRAVENOUS

## 2017-02-23 MED ORDER — INSULIN ASPART 100 UNIT/ML ~~LOC~~ SOLN: 0.0000 [IU] | SUBCUTANEOUS | Status: DC

## 2017-02-23 MED ORDER — DEXAMETHASONE SODIUM PHOSPHATE 10 MG/ML IJ SOLN
INTRAMUSCULAR | Status: DC | PRN
  Administered 2017-02-23: 10 mg via INTRAVENOUS

## 2017-02-23 MED ORDER — POTASSIUM CHLORIDE CRYS ER 20 MEQ PO TBCR
40.0000 meq | EXTENDED_RELEASE_TABLET | Freq: Once | ORAL | Status: AC
  Administered 2017-02-23: 40 meq via ORAL
  Filled 2017-02-23: qty 2

## 2017-02-23 MED ORDER — ACETAMINOPHEN 325 MG PO TABS
650.0000 mg | ORAL_TABLET | Freq: Four times a day (QID) | ORAL | Status: DC | PRN
  Administered 2017-02-23: 325 mg via ORAL
  Administered 2017-02-24 – 2017-02-26 (×8): 650 mg via ORAL
  Filled 2017-02-23 (×9): qty 2

## 2017-02-23 MED ORDER — ONDANSETRON HCL 4 MG/2ML IJ SOLN
INTRAMUSCULAR | Status: DC | PRN
  Administered 2017-02-23: 4 mg via INTRAVENOUS

## 2017-02-23 MED ORDER — ACETAMINOPHEN 650 MG RE SUPP: 650.0000 mg | Freq: Four times a day (QID) | RECTAL | Status: DC | PRN

## 2017-02-23 MED ORDER — HYDROMORPHONE HCL 1 MG/ML IJ SOLN
INTRAMUSCULAR | Status: AC
  Filled 2017-02-23: qty 1

## 2017-02-23 MED ORDER — VANCOMYCIN HCL IN DEXTROSE 1-5 GM/200ML-% IV SOLN
1000.0000 mg | Freq: Three times a day (TID) | INTRAVENOUS | Status: AC
  Administered 2017-02-23: 1000 mg via INTRAVENOUS
  Filled 2017-02-23: qty 200

## 2017-02-23 MED ORDER — PROPOFOL 10 MG/ML IV BOLUS
INTRAVENOUS | Status: DC | PRN
  Administered 2017-02-23: 100 mg via INTRAVENOUS

## 2017-02-23 MED ORDER — SODIUM CHLORIDE 0.9 % IR SOLN
Status: DC | PRN
  Administered 2017-02-23: 3000 mL
  Administered 2017-02-23: 1000 mL

## 2017-02-23 MED ORDER — FENTANYL CITRATE (PF) 250 MCG/5ML IJ SOLN
INTRAMUSCULAR | Status: AC
  Filled 2017-02-23: qty 5

## 2017-02-23 MED ORDER — OXYCODONE HCL 5 MG PO TABS
ORAL_TABLET | ORAL | Status: AC
  Filled 2017-02-23: qty 1

## 2017-02-23 MED ORDER — LACTATED RINGERS IV SOLN
INTRAVENOUS | Status: DC
  Administered 2017-02-23: 18:00:00 via INTRAVENOUS

## 2017-02-23 MED ORDER — HYDROMORPHONE HCL 1 MG/ML IJ SOLN
0.2500 mg | INTRAMUSCULAR | Status: DC | PRN
  Administered 2017-02-23: 0.5 mg via INTRAVENOUS
  Administered 2017-02-23: 0.25 mg via INTRAVENOUS

## 2017-02-23 MED ORDER — OXYCODONE HCL 5 MG PO TABS
5.0000 mg | ORAL_TABLET | ORAL | Status: DC | PRN
  Administered 2017-02-23: 5 mg via ORAL
  Filled 2017-02-23: qty 2

## 2017-02-23 MED ORDER — ONDANSETRON HCL 4 MG/2ML IJ SOLN
INTRAMUSCULAR | Status: AC
  Filled 2017-02-23: qty 2

## 2017-02-23 MED ORDER — VANCOMYCIN HCL IN DEXTROSE 750-5 MG/150ML-% IV SOLN
750.0000 mg | INTRAVENOUS | Status: DC
  Filled 2017-02-23: qty 150

## 2017-02-23 MED ORDER — FENTANYL CITRATE (PF) 100 MCG/2ML IJ SOLN
INTRAMUSCULAR | Status: DC | PRN
  Administered 2017-02-23: 100 ug via INTRAVENOUS

## 2017-02-23 MED ORDER — ONDANSETRON HCL 4 MG/2ML IJ SOLN
4.0000 mg | Freq: Four times a day (QID) | INTRAMUSCULAR | Status: DC | PRN
  Filled 2017-02-23: qty 2

## 2017-02-23 MED ORDER — ACETAMINOPHEN 325 MG PO TABS
650.0000 mg | ORAL_TABLET | Freq: Once | ORAL | Status: AC
  Administered 2017-02-23: 650 mg via ORAL
  Filled 2017-02-23: qty 2

## 2017-02-23 MED ORDER — SUCCINYLCHOLINE 20MG/ML (10ML) SYRINGE FOR MEDFUSION PUMP - OPTIME
INTRAMUSCULAR | Status: DC | PRN
  Administered 2017-02-23: 100 mg via INTRAVENOUS

## 2017-02-23 MED ORDER — PROPOFOL 10 MG/ML IV BOLUS
INTRAVENOUS | Status: AC
  Filled 2017-02-23: qty 20

## 2017-02-23 MED ORDER — LIDOCAINE HCL (CARDIAC) 20 MG/ML IV SOLN
INTRAVENOUS | Status: DC | PRN
  Administered 2017-02-23: 100 mg via INTRATRACHEAL

## 2017-02-23 MED ORDER — DEXAMETHASONE SODIUM PHOSPHATE 10 MG/ML IJ SOLN
INTRAMUSCULAR | Status: AC
  Filled 2017-02-23: qty 1

## 2017-02-23 MED ORDER — FENTANYL CITRATE (PF) 100 MCG/2ML IJ SOLN
25.0000 ug | INTRAMUSCULAR | Status: DC | PRN
  Administered 2017-02-23: 25 ug via INTRAVENOUS
  Filled 2017-02-23 (×2): qty 2

## 2017-02-23 MED ORDER — PROMETHAZINE HCL 25 MG/ML IJ SOLN: 6.2500 mg | INTRAMUSCULAR | Status: DC | PRN

## 2017-02-23 SURGICAL SUPPLY — 34 items
COVER SURGICAL LIGHT HANDLE (MISCELLANEOUS) ×3 IMPLANT
DECANTER SPIKE VIAL GLASS SM (MISCELLANEOUS) IMPLANT
DRAPE INCISE IOBAN 66X45 STRL (DRAPES) IMPLANT
DRAPE SURG 17X23 STRL (DRAPES) ×3 IMPLANT
DRAPE U-SHAPE 47X51 STRL (DRAPES) ×3 IMPLANT
DRSG PAD ABDOMINAL 8X10 ST (GAUZE/BANDAGES/DRESSINGS) ×6 IMPLANT
DURAPREP 26ML APPLICATOR (WOUND CARE) ×3 IMPLANT
ELECT REM PT RETURN 9FT ADLT (ELECTROSURGICAL)
ELECTRODE REM PT RTRN 9FT ADLT (ELECTROSURGICAL) IMPLANT
GAUZE SPONGE 4X4 12PLY STRL LF (GAUZE/BANDAGES/DRESSINGS) ×2 IMPLANT
GAUZE XEROFORM 1X8 LF (GAUZE/BANDAGES/DRESSINGS) ×3 IMPLANT
GLOVE BIO SURGEON STRL SZ8 (GLOVE) ×3 IMPLANT
GLOVE BIOGEL PI IND STRL 8 (GLOVE) ×2 IMPLANT
GLOVE BIOGEL PI INDICATOR 8 (GLOVE) ×1
GLOVE ORTHO TXT STRL SZ7.5 (GLOVE) ×3 IMPLANT
GOWN STRL REUS W/ TWL LRG LVL3 (GOWN DISPOSABLE) ×4 IMPLANT
GOWN STRL REUS W/ TWL XL LVL3 (GOWN DISPOSABLE) ×8 IMPLANT
GOWN STRL REUS W/TWL LRG LVL3 (GOWN DISPOSABLE) ×6
GOWN STRL REUS W/TWL XL LVL3 (GOWN DISPOSABLE) ×12
HANDPIECE INTERPULSE COAX TIP (DISPOSABLE) ×3
KIT BASIN OR (CUSTOM PROCEDURE TRAY) ×3 IMPLANT
KIT ROOM TURNOVER OR (KITS) ×3 IMPLANT
MANIFOLD NEPTUNE II (INSTRUMENTS) ×3 IMPLANT
NDL SPNL 18GX3.5 QUINCKE PK (NEEDLE) ×1 IMPLANT
NEEDLE SPNL 18GX3.5 QUINCKE PK (NEEDLE) ×3 IMPLANT
NS IRRIG 1000ML POUR BTL (IV SOLUTION) ×3 IMPLANT
PACK SHOULDER (CUSTOM PROCEDURE TRAY) ×3 IMPLANT
PAD ARMBOARD 7.5X6 YLW CONV (MISCELLANEOUS) ×6 IMPLANT
SET HNDPC FAN SPRY TIP SCT (DISPOSABLE) ×1 IMPLANT
SUT ETHILON 2 0 FS 18 (SUTURE) ×4 IMPLANT
SUT ETHILON 3 0 PS 1 (SUTURE) IMPLANT
TOWEL OR 17X24 6PK STRL BLUE (TOWEL DISPOSABLE) ×3 IMPLANT
TOWEL OR 17X26 10 PK STRL BLUE (TOWEL DISPOSABLE) ×3 IMPLANT
YANKAUER SUCT BULB TIP NO VENT (SUCTIONS) ×2 IMPLANT

## 2017-02-23 NOTE — Progress Notes (Signed)
Giving 65meq PO K and 2 runs 75meq IV for K of 2.7 this AM.

## 2017-02-23 NOTE — H&P (Signed)
[name] is an 82 y.o. female.   Chief Complaint: Left Shoulder pain HPI: [name] is a 82 year old female who we were consulted for left septic shoulder.  Patient presented to the ER yesterday due to ongoing left shoulder pain.  She states she has had shoulder pain since 12/27.  She awakened with severe left shoulder pain without any known injury.  She saw her primary care physician who placed her on a prednisone taper but this was discontinued after 5 days because it was not helping.  She saw her primary care physician yesterday and had a fever of 102.4 F and was sent to the ER where she was evaluated.  She does states that she has had fatigue fevers and chills.  She denies being diabetic.  CT of the shoulder was ordered and did show a fluid collection consistent with bursitis.  She is placed on vancomycin.  Past Medical History:  Diagnosis Date  . Anemia   . Anxiety   . Cholelithiasis   . Endometriosis   . Goiter   . Herpes simplex   . Hyperlipidemia   . Hypertension   . Loss of weight   . Macular degeneration    both eyes  . Nontoxic multinodular goiter   . Other abnormal blood chemistry   . Peptic ulcer   . PUD (peptic ulcer disease)   . Renal cyst   . Sciatica   . Scoliosis (and kyphoscoliosis), idiopathic   . Unspecified disorder of kidney and ureter   . Urinary frequency     Past Surgical History:  Procedure Laterality Date  . ABDOMINAL HYSTERECTOMY  1971  . CATARACT EXTRACTION  12/2014    Dr. Rutherford Guys  . REPAIR OF PERFORATED ULCER  1973 &1975   Dr Vida Rigger  . STOMACH SURGERY  1977   removed 1/2 stomach    Family History  Problem Relation Age of Onset  . Obesity Mother    Social History:  reports that she quit smoking about 23 years ago. she has never used smokeless tobacco. She reports that she does not drink alcohol or use drugs.  Allergies: No Known Allergies   (Not in a hospital admission)  Results for orders placed or performed during the hospital  encounter of 02/22/17 (from the past 48 hour(s))  Comprehensive metabolic panel     Status: Abnormal   Collection Time: 02/22/17  8:16 PM  Result Value Ref Range   Sodium 132 (L) 135 - 145 mmol/L   Potassium 3.0 (L) 3.5 - 5.1 mmol/L   Chloride 94 (L) 101 - 111 mmol/L   CO2 27 22 - 32 mmol/L   Glucose, Bld 171 (H) 65 - 99 mg/dL   BUN 20 6 - 20 mg/dL   Creatinine, Ser 1.15 (H) 0.44 - 1.00 mg/dL   Calcium 8.2 (L) 8.9 - 10.3 mg/dL   Total Protein 7.1 6.5 - 8.1 g/dL   Albumin 2.4 (L) 3.5 - 5.0 g/dL   AST 25 15 - 41 U/L   ALT 21 14 - 54 U/L   Alkaline Phosphatase 138 (H) 38 - 126 U/L   Total Bilirubin 0.9 0.3 - 1.2 mg/dL   GFR calc non Af Amer 44 (L) >60 mL/min   GFR calc Af Amer 51 (L) >60 mL/min    Comment: (NOTE) The eGFR has been calculated using the CKD EPI equation. This calculation has not been validated in all clinical situations. eGFR's persistently <60 mL/min signify possible Chronic Kidney Disease.  Anion gap 11 5 - 15  CBC with Differential     Status: Abnormal   Collection Time: 02/22/17  8:16 PM  Result Value Ref Range   WBC 32.9 (H) 4.0 - 10.5 K/uL   RBC 4.30 3.87 - 5.11 MIL/uL   Hemoglobin 11.5 (L) 12.0 - 15.0 g/dL   HCT 34.9 (L) 36.0 - 46.0 %   MCV 81.2 78.0 - 100.0 fL   MCH 26.7 26.0 - 34.0 pg   MCHC 33.0 30.0 - 36.0 g/dL   RDW 15.4 11.5 - 15.5 %   Platelets 385 150 - 400 K/uL   Neutrophils Relative % 95 %   Lymphocytes Relative 2 %   Monocytes Relative 3 %   Eosinophils Relative 0 %   Basophils Relative 0 %   Neutro Abs 31.2 (H) 1.7 - 7.7 K/uL   Lymphs Abs 0.7 0.7 - 4.0 K/uL   Monocytes Absolute 1.0 0.1 - 1.0 K/uL   Eosinophils Absolute 0.0 0.0 - 0.7 K/uL   Basophils Absolute 0.0 0.0 - 0.1 K/uL   Smear Review MORPHOLOGY UNREMARKABLE   I-Stat CG4 Lactic Acid, ED     Status: None   Collection Time: 02/22/17  8:30 PM  Result Value Ref Range   Lactic Acid, Venous 1.29 0.5 - 1.9 mmol/L  Urinalysis, Routine w reflex microscopic     Status: Abnormal    Collection Time: 02/22/17  8:34 PM  Result Value Ref Range   Color, Urine YELLOW YELLOW   APPearance HAZY (A) CLEAR   Specific Gravity, Urine 1.019 1.005 - 1.030   pH 5.0 5.0 - 8.0   Glucose, UA NEGATIVE NEGATIVE mg/dL   Hgb urine dipstick SMALL (A) NEGATIVE   Bilirubin Urine NEGATIVE NEGATIVE   Ketones, ur 20 (A) NEGATIVE mg/dL   Protein, ur 100 (A) NEGATIVE mg/dL   Nitrite NEGATIVE NEGATIVE   Leukocytes, UA NEGATIVE NEGATIVE   RBC / HPF 0-5 0 - 5 RBC/hpf   WBC, UA 6-30 0 - 5 WBC/hpf   Bacteria, UA NONE SEEN NONE SEEN   Squamous Epithelial / LPF 0-5 (A) NONE SEEN   Mucus PRESENT   Body fluid culture     Status: None (Preliminary result)   Collection Time: 02/23/17  1:17 AM  Result Value Ref Range   Specimen Description SYNOVIAL Bursa    Special Requests NONE    Gram Stain      ABUNDANT WBC PRESENT, PREDOMINANTLY PMN FEW GRAM POSITIVE COCCI IN CHAINS Gram Stain Report Called to,Read Back By and Verified With: COGGINS,E RN 1.8.19 _0  ZANDOC    Culture PENDING    Report Status PENDING   Synovial cell count + diff, w/ crystals     Status: Abnormal   Collection Time: 02/23/17  1:17 AM  Result Value Ref Range   Color, Synovial WHITE (A) YELLOW   Appearance-Synovial TURBID (A) CLEAR   Crystals, Fluid EXTRACELLULAR CALCIUM PYROPHOSPHATE CRYSTALS     Comment: INTRACELLULAR CALCIUM PYROPHOSPHATE CRYSTALS   WBC, Synovial 447,000 (H) 0 - 200 /cu mm   Neutrophil, Synovial 90 (H) 0 - 25 %   Lymphocytes-Synovial Fld 2 0 - 20 %   Monocyte-Macrophage-Synovial Fluid 8 (L) 50 - 90 %   Eosinophils-Synovial 0 0 - 1 %  Basic metabolic panel     Status: Abnormal   Collection Time: 02/23/17  5:23 AM  Result Value Ref Range   Sodium 133 (L) 135 - 145 mmol/L   Potassium 2.7 (LL) 3.5 - 5.1 mmol/L  Comment: CRITICAL RESULT CALLED TO, READ BACK BY AND VERIFIED WITH: L.ADKINS RN [phone] [id number] A.QUIZON    Chloride 99 (L) 101 - 111 mmol/L   CO2 26 22 - 32 mmol/L   Glucose, Bld 150 (H) 65 - 99  mg/dL   BUN 19 6 - 20 mg/dL   Creatinine, Ser 1.04 (H) 0.44 - 1.00 mg/dL   Calcium 7.8 (L) 8.9 - 10.3 mg/dL   GFR calc non Af Amer 50 (L) >60 mL/min   GFR calc Af Amer 58 (L) >60 mL/min    Comment: (NOTE) The eGFR has been calculated using the CKD EPI equation. This calculation has not been validated in all clinical situations. eGFR's persistently <60 mL/min signify possible Chronic Kidney Disease.    Anion gap 8 5 - 15  CBC     Status: Abnormal   Collection Time: 02/23/17  5:23 AM  Result Value Ref Range   WBC 25.2 (H) 4.0 - 10.5 K/uL   RBC 4.13 3.87 - 5.11 MIL/uL   Hemoglobin 10.9 (L) 12.0 - 15.0 g/dL   HCT 33.8 (L) 36.0 - 46.0 %   MCV 81.8 78.0 - 100.0 fL   MCH 26.4 26.0 - 34.0 pg   MCHC 32.2 30.0 - 36.0 g/dL   RDW 15.4 11.5 - 15.5 %   Platelets 404 (H) 150 - 400 K/uL   Ct Shoulder Left Wo Contrast  Result Date: 02/23/2017 CLINICAL DATA:  82 year old female with left shoulder pain. No known injury. EXAM: CT OF THE UPPER LEFT EXTREMITY WITHOUT CONTRAST TECHNIQUE: Multidetector CT imaging of the upper left extremity was performed according to the standard protocol. COMPARISON:  Radiographs of the left shoulder from earlier on the same day. FINDINGS: Bones/Joint/Cartilage No acute fracture is identified of the left shoulder. Slight dorsal subluxation of the humeral head relative to the glenoid likely from mass effect from adjacent presumed large periarticular/ bursal fluid collection. No suspicious osseous lesion is seen. Ligaments Suboptimally assessed by CT. Muscles and Tendons Overlying the subscapularis muscle is a masslike hypodensity suspicious for tracking of what may represent a large subacromial and subdeltoid bursal fluid collection. This measures approximately 7.7 x 3.9 x 6.6 cm on series 7, image 35 and coronal image 23. This finding may represent stigmata of a large bursitis. Intramuscular mass cannot be entirely excluded given limitations of a noncontrast study. Crescentic  subtle hypodensity deep to the deltoid muscle is also noted on the coronal oblique and sagittal views causing soft tissue prominence and swelling. There is also likely reflects stigmata of a large sub deltoid and subacromial bursal fluid collection. Non emergent MRI without and with contrast would help further correlation and to exclude intrinsic masses or adenopathy involving or adjacent to the subscapularis. IMPRESSION: 1. Lack of intravenous contrast limits assessment. Subtle hypodensity overlies the subscapularis tendon believed to represent continuation of a large subacromial and subdeltoid bursal fluid collection likely from enlarged bursitis. This measures approximately 7.7 x 3.9 x 6.6 cm. Alternatively, subacute intramuscular hematoma without active hemorrhage might have a similar appearance appear An eccentric intramuscular mass of the subscapularis, or potentially adjacent adenopathy is not entirely excluded but believed less likely. Subtle low attenuation favors fluid. Findings may represent stigmata of a large subacromial and subdeltoid bursitis. Suggest non emergent MRI for better assessment however. 2. Soft tissue thickening likely related to tracking of fluid within the subdeltoid bursa as well. 3. No acute osseous abnormality of the left shoulder. No suspicious osseous lesions. Electronically Signed   By: Shanon Brow  Randel Pigg M.D.   On: 02/23/2017 00:16   Dg Shoulder Left  Result Date: 02/22/2017 CLINICAL DATA:  Progressive left shoulder pain for 1 week. Fever. Possible septic joint. EXAM: LEFT SHOULDER - 2+ VIEW COMPARISON:  None. FINDINGS: The bones appear mildly demineralized. No evidence of acute fracture, dislocation or bone destruction. Mild inferior subluxation of the humeral head relative to the glenoid may indicate the presence of a joint effusion. There is possible soft tissue swelling around the shoulder. Aortic atherosclerosis noted. Mild midthoracic compression deformity, age indeterminate.  IMPRESSION: Suspected joint effusion and periarticular soft tissue swelling; joint aspiration recommended if clinical concern of septic joint. No acute osseous findings or signs of osteomyelitis. Electronically Signed   By: Richardean Sale M.D.   On: 02/22/2017 18:16    Review of Systems  Constitutional: Positive for chills, fever and malaise/fatigue. Negative for weight loss.  Respiratory: Negative for shortness of breath.   Cardiovascular: Negative for chest pain.  Gastrointestinal: Negative for heartburn, nausea and vomiting.  Musculoskeletal: Positive for myalgias. Negative for falls.  Neurological: Negative for dizziness, seizures, loss of consciousness, weakness and headaches.  Blood pressure 137/81, pulse 98, temperature 97.9 F (36.6 C), temperature source Oral, resp. rate 16, height _0  (1.626 m), weight 104 lb (47.2 kg), SpO2 96 %. Physical Exam  Constitutional: She is oriented to person, place, and time. She appears well-developed and well-nourished. No distress.  Respiratory: Effort normal.  Neurological: She is alert and oriented to person, place, and time.  Skin: She is not diaphoretic.  Psychiatric: She has a normal mood and affect. Her behavior is normal. Thought content normal.   Bilateral shoulders: Right shoulder full range of motion without pain.  No deformity no abnormal warmth or effusion.  Left shoulder obvious edema and effusion.  Increased warmth left shoulder compared to the right.  Decreased range of motion left shoulder only able to bring to forward flexion of 90 degrees decreased external rotation.    Assessment/Plan Left shoulder septic bursitis.   Gramig stain left shoulder shows gram-positive cocci in chains.   CT scan left shoulder shows a large subacromial and subdeltoid bursal fluid collection measuring 7.7 x 3.9 x 6.6 cm consistent with an enlarged bursitis.   Patient with a white count of 25,200 at 5 AM yesterday white count was 32,900.   T-max 102.4  F current temp 97.2 F.  Currently on vancomycin We will leave patient n.p.o. for surgery this afternoon at [facility].  Will have patient transferred to [facility] for a left shoulder irrigation and debridement.  Discussed left shoulder irrigation and debridement with patient today risks benefits discussed with patient at length.  Faron Whitelock, PA-C 02/23/2017, 8:30 AM

## 2017-02-23 NOTE — ED Notes (Signed)
[facility] called. 

## 2017-02-23 NOTE — Anesthesia Postprocedure Evaluation (Signed)
Anesthesia Post Note  Patient: [name]  Procedure(s) Performed: IRRIGATION AND DEBRIDEMENT ABSCESS (Left Shoulder)     Patient location during evaluation: PACU Anesthesia Type: General Level of consciousness: awake and alert, patient cooperative and oriented Pain management: pain level controlled (pain is improving) Vital Signs Assessment: post-procedure vital signs reviewed and stable Respiratory status: spontaneous breathing, nonlabored ventilation and respiratory function stable Cardiovascular status: blood pressure returned to baseline and stable Postop Assessment: no apparent nausea or vomiting Anesthetic complications: no    Last Vitals:  Vitals:   02/23/17 1945 02/23/17 2000  BP: (!) 141/77 (!) 149/78  Pulse: 94 95  Resp: 18 19  Temp:    SpO2: 100% 100%    Last Pain:  Vitals:   02/23/17 2000  TempSrc:   PainSc: 6                  Sunnie Odden,E. Barnell Shieh

## 2017-02-23 NOTE — H&P (Addendum)
History and Physical    MERL GUARDINO OQH:[id number] DOB: Feb 07, 1936 DOA: 02/22/2017  PCP: Gayland Curry, DO  Patient coming from: Home  I have personally briefly reviewed patient's old medical records in [facility]  Chief Complaint: L shoulder pain  HPI: [name] is a 82 y.o. female with medical history significant of possible DM, HTN, dyslipidemia.  Patient presents to ED for ongoing L shoulder pain.  Symptoms onset 12/27.  Awoke with severe pain in L shoulder.  Associated with limited ROM, pain worse with movement of shoulder.  Saw PCP who put her on prednisone taper.  Discontinued this after 5 days because she felt it wasn't helping.  Saw PCP again today, fever 102.4.  Sent in to ED.   ED Course: WBC 32k, Tm 102.4.  CT shoulder shows fluid collection c/w bursitis.  EDP performed tap, sent fluid to lab.  Patient given dose of vanc.   Review of Systems: As per HPI otherwise 10 point review of systems negative.   Past Medical History:  Diagnosis Date  . Anemia   . Anxiety   . Cholelithiasis   . Endometriosis   . Goiter   . Herpes simplex   . Hyperlipidemia   . Hypertension   . Loss of weight   . Macular degeneration    both eyes  . Nontoxic multinodular goiter   . Other abnormal blood chemistry   . Peptic ulcer   . PUD (peptic ulcer disease)   . Renal cyst   . Sciatica   . Scoliosis (and kyphoscoliosis), idiopathic   . Type II or unspecified type diabetes mellitus without mention of complication, uncontrolled   . Unspecified disorder of kidney and ureter   . Urinary frequency     Past Surgical History:  Procedure Laterality Date  . ABDOMINAL HYSTERECTOMY  1971  . CATARACT EXTRACTION  12/2014    Dr. Rutherford Guys  . REPAIR OF PERFORATED ULCER  1973 &1975   Dr Vida Rigger  . [place]   removed 1/2 stomach     reports that she quit smoking about 23 years ago. she has never used smokeless tobacco. She reports that she does not drink alcohol or use  drugs.  No Known Allergies  Family History  Problem Relation Age of Onset  . Obesity Mother      Prior to Admission medications   Medication Sig Start Date End Date Taking? Authorizing Provider  acetaminophen (TYLENOL) 500 MG tablet Take 1,000 mg by mouth every 6 (six) hours as needed for mild pain.   Yes [provider]  BYSTOLIC 10 MG tablet TAKE 1/2 TABLET BY MOUTH ONCE DAILY 08/06/16  Yes Reed, Tiffany L, DO  EXFORGE 10-320 MG tablet take 1 tablet by mouth once daily 07/08/16  Yes Reed, Tiffany L, DO  ALPRAZolam Duanne Moron) 0.25 MG tablet take 1 tablet by mouth at bedtime if needed Patient not taking: Reported on 02/22/2017 01/25/17   Gayland Curry, DO    Physical Exam: Vitals:   02/22/17 2230 02/23/17 0009 02/23/17 0122 02/23/17 0159  BP: 106/64 114/65 (!) 119/59 119/63  Pulse: 84 83 86 84  Resp: 16 (!) 24 16 16   Temp: (!) 97.2 F (36.2 C) 97.9 F (36.6 C)    TempSrc: Oral Oral    SpO2: 97% 99% 98% 97%  Weight:      Height:        Constitutional: NAD, calm, comfortable Eyes: PERRL, lids and conjunctivae normal ENMT:  Mucous membranes are moist. Posterior pharynx clear of any exudate or lesions.Normal dentition.  Neck: normal, supple, no masses, no thyromegaly Respiratory: clear to auscultation bilaterally, no wheezing, no crackles. Normal respiratory effort. No accessory muscle use.  Cardiovascular: Regular rate and rhythm, no murmurs / rubs / gallops. No extremity edema. 2+ pedal pulses. No carotid bruits.  Abdomen: no tenderness, no masses palpated. No hepatosplenomegaly. Bowel sounds positive.  Musculoskeletal: no clubbing / cyanosis. No joint deformity upper and lower extremities. Good ROM, no contractures. Normal muscle tone.  Skin: no rashes, lesions, ulcers. No induration Neurologic: CN 2-12 grossly intact. Sensation intact, DTR normal. Strength 5/5 in all 4.  Psychiatric: Normal judgment and insight. Alert and oriented x 3. Normal mood.    Labs on  Admission: I have personally reviewed following labs and imaging studies  CBC: Recent Labs  Lab 02/22/17 2016  WBC 32.9*  NEUTROABS 31.2*  HGB 11.5*  HCT 34.9*  MCV 81.2  PLT 810   Basic Metabolic Panel: Recent Labs  Lab 02/22/17 2016  NA 132*  K 3.0*  CL 94*  CO2 27  GLUCOSE 171*  BUN 20  CREATININE 1.15*  CALCIUM 8.2*   GFR: Estimated Creatinine Clearance: 29.6 mL/min (A) (by C-G formula based on SCr of 1.15 mg/dL (H)). Liver Function Tests: Recent Labs  Lab 02/22/17 2016  AST 25  ALT 21  ALKPHOS 138*  BILITOT 0.9  PROT 7.1  ALBUMIN 2.4*   No results for input(s): LIPASE, AMYLASE in the last 168 hours. No results for input(s): AMMONIA in the last 168 hours. Coagulation Profile: No results for input(s): INR, PROTIME in the last 168 hours. Cardiac Enzymes: No results for input(s): CKTOTAL, CKMB, CKMBINDEX, TROPONINI in the last 168 hours. BNP (last 3 results) No results for input(s): PROBNP in the last 8760 hours. HbA1C: No results for input(s): HGBA1C in the last 72 hours. CBG: No results for input(s): GLUCAP in the last 168 hours. Lipid Profile: No results for input(s): CHOL, HDL, LDLCALC, TRIG, CHOLHDL, LDLDIRECT in the last 72 hours. Thyroid Function Tests: No results for input(s): TSH, T4TOTAL, FREET4, T3FREE, THYROIDAB in the last 72 hours. Anemia Panel: No results for input(s): VITAMINB12, FOLATE, FERRITIN, TIBC, IRON, RETICCTPCT in the last 72 hours. Urine analysis:    Component Value Date/Time   COLORURINE YELLOW 02/22/2017 2034   APPEARANCEUR HAZY (A) 02/22/2017 2034   LABSPEC 1.019 02/22/2017 2034   PHURINE 5.0 02/22/2017 2034   GLUCOSEU NEGATIVE 02/22/2017 2034   HGBUR SMALL (A) 02/22/2017 2034   BILIRUBINUR NEGATIVE 02/22/2017 2034   BILIRUBINUR neg 10/12/2016 1156   BILIRUBINUR neg 05/14/2014 1601   KETONESUR 20 (A) 02/22/2017 2034   PROTEINUR 100 (A) 02/22/2017 2034   UROBILINOGEN 0.2 10/12/2016 1156   NITRITE NEGATIVE 02/22/2017  2034   LEUKOCYTESUR NEGATIVE 02/22/2017 2034    Radiological Exams on Admission: Ct Shoulder Left Wo Contrast  Result Date: 02/23/2017 CLINICAL DATA:  82 year old female with left shoulder pain. No known injury. EXAM: CT OF THE UPPER LEFT EXTREMITY WITHOUT CONTRAST TECHNIQUE: Multidetector CT imaging of the upper left extremity was performed according to the standard protocol. COMPARISON:  Radiographs of the left shoulder from earlier on the same day. FINDINGS: Bones/Joint/Cartilage No acute fracture is identified of the left shoulder. Slight dorsal subluxation of the humeral head relative to the glenoid likely from mass effect from adjacent presumed large periarticular/ bursal fluid collection. No suspicious osseous lesion is seen. Ligaments Suboptimally assessed by CT. Muscles and Tendons Overlying the subscapularis  muscle is a masslike hypodensity suspicious for tracking of what may represent a large subacromial and subdeltoid bursal fluid collection. This measures approximately 7.7 x 3.9 x 6.6 cm on series 7, image 35 and coronal image 23. This finding may represent stigmata of a large bursitis. Intramuscular mass cannot be entirely excluded given limitations of a noncontrast study. Crescentic subtle hypodensity deep to the deltoid muscle is also noted on the coronal oblique and sagittal views causing soft tissue prominence and swelling. There is also likely reflects stigmata of a large sub deltoid and subacromial bursal fluid collection. Non emergent MRI without and with contrast would help further correlation and to exclude intrinsic masses or adenopathy involving or adjacent to the subscapularis. IMPRESSION: 1. Lack of intravenous contrast limits assessment. Subtle hypodensity overlies the subscapularis tendon believed to represent continuation of a large subacromial and subdeltoid bursal fluid collection likely from enlarged bursitis. This measures approximately 7.7 x 3.9 x 6.6 cm. Alternatively,  subacute intramuscular hematoma without active hemorrhage might have a similar appearance appear An eccentric intramuscular mass of the subscapularis, or potentially adjacent adenopathy is not entirely excluded but believed less likely. Subtle low attenuation favors fluid. Findings may represent stigmata of a large subacromial and subdeltoid bursitis. Suggest non emergent MRI for better assessment however. 2. Soft tissue thickening likely related to tracking of fluid within the subdeltoid bursa as well. 3. No acute osseous abnormality of the left shoulder. No suspicious osseous lesions. Electronically Signed   By: Ashley Royalty M.D.   On: 02/23/2017 00:16   Dg Shoulder Left  Result Date: 02/22/2017 CLINICAL DATA:  Progressive left shoulder pain for 1 week. Fever. Possible septic joint. EXAM: LEFT SHOULDER - 2+ VIEW COMPARISON:  None. FINDINGS: The bones appear mildly demineralized. No evidence of acute fracture, dislocation or bone destruction. Mild inferior subluxation of the humeral head relative to the glenoid may indicate the presence of a joint effusion. There is possible soft tissue swelling around the shoulder. Aortic atherosclerosis noted. Mild midthoracic compression deformity, age indeterminate. IMPRESSION: Suspected joint effusion and periarticular soft tissue swelling; joint aspiration recommended if clinical concern of septic joint. No acute osseous findings or signs of osteomyelitis. Electronically Signed   By: Richardean Sale M.D.   On: 02/22/2017 18:16    EKG: Independently reviewed.  Assessment/Plan Principal Problem:   Septic bursitis Active Problems:   Essential hypertension, benign   DM2 (diabetes mellitus, type 2) ([place])    1. Septic bursitis - 1. GS shows GPC in chains 2. Culture pending 3. Continue vanc 4. IVF: NS at 100 cc/hr 5. Call ortho in AM 6. Will leave patient NPO and SCDs for DVT ppx in case they want to go in for surgical wash out 7. Repeat CBC in AM to trend  WBC 2. HTN - holding home BP meds for now 3. Question of DM2 - 1. Patient denies h/o DM 2. Denies checking BGL 3. Denies taking meds for this 4. Will just monitor BMPs in Hospital.  DVT prophylaxis: SCDs Code Status: FULL - This confirmed by RN directly with patient in ED, she does want to be resusitated Family Communication: No family in room Disposition Plan: Home after admit Consults called: None, call ortho in AM Admission status: Admit to inpatient - inpatient status for IV abx for treatment of septic bursitis   Etta Quill. DO [facility] Pager [phone]  If 7AM-7PM, please contact day team taking care of patient [email or web address] Password [email or web address]  02/23/2017, 2:19 AM

## 2017-02-23 NOTE — Anesthesia Preprocedure Evaluation (Signed)
Anesthesia Evaluation    History of Anesthesia Complications Negative for: history of anesthetic complications  Airway Mallampati: II  TM Distance: >3 FB Neck ROM: Full    Dental no notable dental hx. (+) Dental Advisory Given   Pulmonary former smoker,    Pulmonary exam normal        Cardiovascular hypertension, Normal cardiovascular exam     Neuro/Psych PSYCHIATRIC DISORDERS Anxiety negative neurological ROS     GI/Hepatic Neg liver ROS, PUD,   Endo/Other  negative endocrine ROS  Renal/GU Renal disease     Musculoskeletal   Abdominal   Peds  Hematology negative hematology ROS (+)   Anesthesia Other Findings   Reproductive/Obstetrics                            Anesthesia Physical Anesthesia Plan  ASA: III and emergent  Anesthesia Plan: General   Post-op Pain Management:    Induction: Intravenous  PONV Risk Score and Plan: 3 and Ondansetron, Dexamethasone and Diphenhydramine  Airway Management Planned: Oral ETT  Additional Equipment:   Intra-op Plan:   Post-operative Plan: Extubation in OR  Informed Consent: I have reviewed the patients History and Physical, chart, labs and discussed the procedure including the risks, benefits and alternatives for the proposed anesthesia with the patient or authorized representative who has indicated his/her understanding and acceptance.   Dental advisory given  Plan Discussed with: CRNA, Anesthesiologist and Surgeon  Anesthesia Plan Comments:         Anesthesia Quick Evaluation

## 2017-02-23 NOTE — Anesthesia Procedure Notes (Addendum)
Date/Time: 02/23/2017 6:47 PM Performed by: Valetta Fuller, CRNA Pre-anesthesia Checklist: Patient identified, Emergency Drugs available, Suction available and Patient being monitored Patient Re-evaluated:Patient Re-evaluated prior to induction Oxygen Delivery Method: Circle system utilized Preoxygenation: Pre-oxygenation with 100% oxygen Induction Type: IV induction Laryngoscope Size: Miller and 2 Grade View: Grade I Tube type: Oral Tube size: 7.5 mm Number of attempts: 1 Airway Equipment and Method: Stylet Placement Confirmation: ETT inserted through vocal cords under direct vision,  positive ETCO2 and breath sounds checked- equal and bilateral Secured at: 23 cm Tube secured with: Tape Dental Injury: Teeth and Oropharynx as per pre-operative assessment

## 2017-02-23 NOTE — Plan of Care (Signed)
  Progressing Education: Knowledge of General Education information will improve 02/23/2017 2227 - Progressing by Charlena Cross, RN Health Behavior/Discharge Planning: Ability to manage health-related needs will improve 02/23/2017 2227 - Progressing by Charlena Cross, RN Clinical Measurements: Ability to maintain clinical measurements within normal limits will improve 02/23/2017 2227 - Progressing by Charlena Cross, RN Will remain free from infection 02/23/2017 2227 - Progressing by Charlena Cross, RN Diagnostic test results will improve 02/23/2017 2227 - Progressing by Margot Chimes D, RN Respiratory complications will improve 02/23/2017 2227 - Progressing by Charlena Cross, RN Cardiovascular complication will be avoided 02/23/2017 2227 - Progressing by Margot Chimes D, RN Activity: Risk for activity intolerance will decrease 02/23/2017 2227 - Progressing by Charlena Cross, RN Nutrition: Adequate nutrition will be maintained 02/23/2017 2227 - Progressing by Margot Chimes D, RN Coping: Level of anxiety will decrease 02/23/2017 2227 - Progressing by Charlena Cross, RN Elimination: Will not experience complications related to bowel motility 02/23/2017 2227 - Progressing by Charlena Cross, RN Will not experience complications related to urinary retention 02/23/2017 2227 - Progressing by Charlena Cross, RN Pain Managment: General experience of comfort will improve 02/23/2017 2227 - Progressing by Charlena Cross, RN Safety: Ability to remain free from injury will improve 02/23/2017 2227 - Progressing by Charlena Cross, RN Skin Integrity: Risk for impaired skin integrity will decrease 02/23/2017 2227 - Progressing by Charlena Cross, RN

## 2017-02-23 NOTE — ED Notes (Signed)
Report provided for short stay nurse, Heath Lark, RN. Patient to be transferred to [facility] via [facility].

## 2017-02-23 NOTE — ED Notes (Signed)
Report given to [facility], Therapist, sports.

## 2017-02-23 NOTE — Progress Notes (Signed)
[facility] has called advised short stay due to volume patient may be delayed in arriving. Dr. Ninfa Linden aware. [facility] states they will try to get patient here by 1630

## 2017-02-23 NOTE — Transfer of Care (Signed)
Immediate Anesthesia Transfer of Care Note  Patient: [name]  Procedure(s) Performed: IRRIGATION AND DEBRIDEMENT ABSCESS (Left Shoulder)  Patient Location: PACU  Anesthesia Type:General  Level of Consciousness: awake, alert  and oriented  Airway & Oxygen Therapy: Patient connected to nasal cannula oxygen  Post-op Assessment: Report given to RN, Post -op Vital signs reviewed and stable and Patient moving all extremities X 4  Post vital signs: Reviewed and stable  Last Vitals:  Vitals:   02/23/17 1024 02/23/17 1320  BP: 135/75 131/68  Pulse: 98 93  Resp: 18 18  Temp:    SpO2: 97% 99%    Last Pain:  Vitals:   02/23/17 1320  TempSrc:   PainSc: 0-No pain         Complications: No apparent anesthesia complications

## 2017-02-23 NOTE — Progress Notes (Signed)
Pharmacy Antibiotic Note  BRESHAY ILG is a 82 y.o. female admitted on 02/22/2017 with sepsis.  Pharmacy has been consulted for Vancomycin dosing.  Plan: Vancomycin 1gm iv x1, then 750mg  iv q48hr Goal AUC = 400 - 500 for all indications, except meningitis (goal AUC > 500 and Cmin 15-20 mcg/mL)   Height: 5\' 4"  (162.6 cm) Weight: 104 lb (47.2 kg) IBW/kg (Calculated) : 54.7  Temp (24hrs), Avg:99.6 F (37.6 C), Min:97.2 F (36.2 C), Max:102.4 F (39.1 C)  Recent Labs  Lab 02/22/17 2016 02/22/17 2030  WBC 32.9*  --   CREATININE 1.15*  --   LATICACIDVEN  --  1.29    Estimated Creatinine Clearance: 29.6 mL/min (A) (by C-G formula based on SCr of 1.15 mg/dL (H)).    No Known Allergies  Antimicrobials this admission: Vancomycin 02/23/2017 >>   Dose adjustments this admission: -  Microbiology results: pending  Thank you for allowing pharmacy to be a part of this patient's care.  Nani Skillern Crowford 02/23/2017 2:43 AM

## 2017-02-23 NOTE — Progress Notes (Signed)
Patient ID: [name], female   DOB: 11/11/1935, 82 y.o.   MRN: [id number] Patient was admitted early this morning for left shoulder pain with provisional diagnosis of septic bursitis and started on vancomycin.  Seen and examined the patient at bedside and discussed the plan of care.  I reviewed the medical records including this morning's H&P myself.  Currently hemodynamically stable.  Leukocytosis is improving.  I will add Rocephin as well.  Continue vancomycin.  Follow cultures.  I spoke to Dr. Valere Dross on call who recommended that patient be transferred to [facility] for need of surgical intervention.  Labs this morning showed hypokalemia for which replacement has already been ordered.  Repeat a.m. labs.  Transfer to [facility] for further treatment.

## 2017-02-23 NOTE — Brief Op Note (Signed)
02/22/2017 - 02/23/2017  7:16 PM  PATIENT:  [place]  82 y.o. female  PRE-OPERATIVE DIAGNOSIS:  Septic Left Shoulder, abscess left shoulder  POST-OPERATIVE DIAGNOSIS:  Septic Left Shoulder, abscess left shoulder  PROCEDURE:  Procedure(s): IRRIGATION AND DEBRIDEMENT ABSCESS (Left)  SURGEON:  Surgeon(s) and Role:    Mcarthur Rossetti, MD - Primary  PHYSICIAN ASSISTANT: Benita Stabile, PA-C  ANESTHESIA:   general  COUNTS:  YES  DICTATION: .Other Dictation: Dictation Number [id number]  PLAN OF CARE: Admit to inpatient   PATIENT DISPOSITION:  PACU - hemodynamically stable.   Delay start of Pharmacological VTE agent (>24hrs) due to surgical blood loss or risk of bleeding: no

## 2017-02-24 ENCOUNTER — Encounter (HOSPITAL_COMMUNITY): Payer: Self-pay | Admitting: Orthopaedic Surgery

## 2017-02-24 DIAGNOSIS — R7881 Bacteremia: Secondary | ICD-10-CM

## 2017-02-24 DIAGNOSIS — F419 Anxiety disorder, unspecified: Secondary | ICD-10-CM

## 2017-02-24 DIAGNOSIS — B955 Unspecified streptococcus as the cause of diseases classified elsewhere: Secondary | ICD-10-CM

## 2017-02-24 DIAGNOSIS — A419 Sepsis, unspecified organism: Secondary | ICD-10-CM

## 2017-02-24 DIAGNOSIS — L02414 Cutaneous abscess of left upper limb: Secondary | ICD-10-CM

## 2017-02-24 DIAGNOSIS — F1021 Alcohol dependence, in remission: Secondary | ICD-10-CM

## 2017-02-24 DIAGNOSIS — I10 Essential (primary) hypertension: Secondary | ICD-10-CM

## 2017-02-24 LAB — CBC WITH DIFFERENTIAL/PLATELET
BASOS ABS: 0 10*3/uL (ref 0.0–0.1)
Basophils Relative: 0 %
EOS PCT: 0 %
Eosinophils Absolute: 0 10*3/uL (ref 0.0–0.7)
HEMATOCRIT: 29.4 % — AB (ref 36.0–46.0)
Hemoglobin: 9.1 g/dL — ABNORMAL LOW (ref 12.0–15.0)
LYMPHS ABS: 0.7 10*3/uL (ref 0.7–4.0)
LYMPHS PCT: 3 %
MCH: 25.6 pg — AB (ref 26.0–34.0)
MCHC: 31 g/dL (ref 30.0–36.0)
MCV: 82.6 fL (ref 78.0–100.0)
MONO ABS: 0.2 10*3/uL (ref 0.1–1.0)
Monocytes Relative: 1 %
NEUTROS ABS: 20.9 10*3/uL — AB (ref 1.7–7.7)
Neutrophils Relative %: 96 %
PLATELETS: 386 10*3/uL (ref 150–400)
RBC: 3.56 MIL/uL — AB (ref 3.87–5.11)
RDW: 15.5 % (ref 11.5–15.5)
WBC: 21.7 10*3/uL — ABNORMAL HIGH (ref 4.0–10.5)

## 2017-02-24 LAB — BASIC METABOLIC PANEL
ANION GAP: 9 (ref 5–15)
BUN: 17 mg/dL (ref 6–20)
CO2: 25 mmol/L (ref 22–32)
Calcium: 7.6 mg/dL — ABNORMAL LOW (ref 8.9–10.3)
Chloride: 101 mmol/L (ref 101–111)
Creatinine, Ser: 1.04 mg/dL — ABNORMAL HIGH (ref 0.44–1.00)
GFR calc Af Amer: 58 mL/min — ABNORMAL LOW (ref >60)
GFR calc non Af Amer: 50 mL/min — ABNORMAL LOW (ref >60)
GLUCOSE: 200 mg/dL — AB (ref 65–99)
POTASSIUM: 4.1 mmol/L (ref 3.5–5.1)
Sodium: 135 mmol/L (ref 135–145)

## 2017-02-24 LAB — C-REACTIVE PROTEIN: CRP: 19.2 mg/dL — ABNORMAL HIGH (ref <1.0)

## 2017-02-24 LAB — MAGNESIUM: Magnesium: 1.5 mg/dL — ABNORMAL LOW (ref 1.7–2.4)

## 2017-02-24 MED ORDER — DEXTROSE 5 % IV SOLN
4.0000 10*6.[IU] | INTRAVENOUS | Status: DC
  Administered 2017-02-24 (×3): 4 10*6.[IU] via INTRAVENOUS
  Filled 2017-02-24 (×5): qty 4

## 2017-02-24 MED ORDER — DEXTROSE 5 % IV SOLN
2.0000 g | INTRAVENOUS | Status: DC
  Administered 2017-02-24 – 2017-02-25 (×2): 2 g via INTRAVENOUS
  Filled 2017-02-24 (×3): qty 2

## 2017-02-24 MED ORDER — MAGIC MOUTHWASH W/LIDOCAINE
15.0000 mL | Freq: Four times a day (QID) | ORAL | Status: DC | PRN
  Administered 2017-02-24: 15 mL via ORAL
  Filled 2017-02-24 (×3): qty 15

## 2017-02-24 MED ORDER — PENICILLIN G POTASSIUM 5000000 UNITS IJ SOLR
4.0000 10*6.[IU] | Freq: Four times a day (QID) | INTRAVENOUS | Status: DC
  Filled 2017-02-24: qty 4

## 2017-02-24 NOTE — Progress Notes (Signed)
Report called to RN on 5N. Pt transferred to 5N04 via wheelchair. Holli Humbles, RN

## 2017-02-24 NOTE — Progress Notes (Addendum)
PHARMACY - PHYSICIAN COMMUNICATION CRITICAL VALUE ALERT - BLOOD CULTURE IDENTIFICATION (BCID)  [name] is an 82 y.o. female who presented to [facility] on 02/22/2017 with a chief complaint of shoulder pain. Now s/p I&D of L shoulder abscess also with 2/2 blood cultures with Group A strep.  Name of physician (or Provider) Contacted: Tylene Fantasia   Current antibiotics:  -Stop ceftriaxone, vancomycin -Start Penicillin G 4 million units IV q6h for CrCl ~30-54mL/min -Could consider starting clindamycin if patient worsens- no septic shock or necrotizing fascitis    Changes to prescribed antibiotics recommended:  Recommendations accepted by provider  Results for orders placed or performed during the hospital encounter of 02/22/17  Blood Culture ID Panel (Reflexed) (Collected: 02/22/2017 10:56 PM)  Result Value Ref Range   Enterococcus species NOT DETECTED NOT DETECTED   Listeria monocytogenes NOT DETECTED NOT DETECTED   Staphylococcus species NOT DETECTED NOT DETECTED   Staphylococcus aureus NOT DETECTED NOT DETECTED   Streptococcus species DETECTED (A) NOT DETECTED   Streptococcus agalactiae NOT DETECTED NOT DETECTED   Streptococcus pneumoniae NOT DETECTED NOT DETECTED   Streptococcus pyogenes DETECTED (A) NOT DETECTED   Acinetobacter baumannii NOT DETECTED NOT DETECTED   Enterobacteriaceae species NOT DETECTED NOT DETECTED   Enterobacter cloacae complex NOT DETECTED NOT DETECTED   Escherichia coli NOT DETECTED NOT DETECTED   Klebsiella oxytoca NOT DETECTED NOT DETECTED   Klebsiella pneumoniae NOT DETECTED NOT DETECTED   Proteus species NOT DETECTED NOT DETECTED   Serratia marcescens NOT DETECTED NOT DETECTED   Haemophilus influenzae NOT DETECTED NOT DETECTED   Neisseria meningitidis NOT DETECTED NOT DETECTED   Pseudomonas aeruginosa NOT DETECTED NOT DETECTED   Candida albicans NOT DETECTED NOT DETECTED   Candida glabrata NOT DETECTED NOT DETECTED   Candida krusei NOT DETECTED NOT  DETECTED   Candida parapsilosis NOT DETECTED NOT DETECTED   Candida tropicalis NOT DETECTED NOT DETECTED    Harvel Quale 02/24/2017  12:30 AM

## 2017-02-24 NOTE — Progress Notes (Addendum)
Pharmacy Antibiotic Note  [name] is a 82 y.o. female admitted on 02/22/2017 with Group A strep bursitis/bacteremia. Patient is being converted to ceftriaxone 2 gm every 24 hours to cover strep pyogenes while inpatient to simplify her regimen. Clindamycin should not be considered at this time. Clindamycin is usually recommended in Group A strep infections that are necrotizing or cause toxic shock to assist with both toxin production and the inoculum of the disease, also known as the Eagle effect.   The Eagle effect occurs when the number of streptococci present are high and a large number are in the stationary growth phase. Cell-wall agents such as beta-lactams work most effectively in actively replicating bacteria and therefore have reduced efficacy when bacterial burden is high. Clindamycin's effect is not changed by high numbers of bacteria due to its action on the ribosome. Addition of clindamycin is therefore able to lower the burden of bacteria and allow the beta-lactam antibiotics to exert their full effects. Clindamycin is not indicated in this patient because her burden of bacteria has already been reduced by surgical intervention and she has clinically improved.   Plan: - Change penicillin IV to Ceftriaxone 2 gm every 24 hours - Convert patient to oral amoxicillin on discharge       Susa Raring, PharmD, BCPS PGY2 Infectious Diseases Pharmacy Resident  02/24/2017 1:20 PM

## 2017-02-24 NOTE — Progress Notes (Signed)
Physical Therapy Treatment Patient Details Name: [name] MRN: [id number] DOB: [birth year] Today's Date: 02/24/2017    History of Present Illness Pt is a 82 y/o female who presents with a L septic shoulder. She is now s/p I&D of L shoulder on 02/23/17.     PT Comments    Pt admitted with above diagnosis. Pt currently with functional limitations due to the deficits listed below (see PT Problem List). At the time of PT eval pt was able to demonstrate transfers and ambulation with gross min guard assist to min assist for balance support and safety without an AD. Recommend continued therapy at the SNF level prior to return home alone, as she will only have intermittent support available. Acutely, pt will benefit from skilled PT to increase their independence and safety with mobility to allow discharge to the venue listed below.      Follow Up Recommendations  SNF;Supervision/Assistance - 24 hour     Equipment Recommendations  Cane    Recommendations for Other Services       Precautions / Restrictions Precautions Precautions: Fall Restrictions Weight Bearing Restrictions: No    Mobility  Bed Mobility Overal bed mobility: Needs Assistance Bed Mobility: Supine to Sit     Supine to sit: Supervision     General bed mobility comments: Pt was able to transition to EOB without assistance. HOB flat however rails used for assist with RUE.   Transfers Overall transfer level: Needs assistance Equipment used: None Transfers: Sit to/from Stand Sit to Stand: Min guard         General transfer comment: VC's for hand placement on seated surface for safety. Pt guarding LUE with any movement with L fist clenched. Was able to relax with 1 minute standing.   Ambulation/Gait Ambulation/Gait assistance: Min guard;Min assist Ambulation Distance (Feet): 200 Feet Assistive device: None Gait Pattern/deviations: Step-through pattern;Decreased stride length;Staggering left Gait velocity:  Decreased Gait velocity interpretation: Below normal speed for age/gender General Gait Details: Occasional min assist required for unsteadiness and lateral LOB. Pt reaching for external support with RUE.    Stairs            Wheelchair Mobility    Modified Rankin (Stroke Patients Only)       Balance Overall balance assessment: Needs assistance Sitting-balance support: Feet supported;No upper extremity supported Sitting balance-Leahy Scale: Fair     Standing balance support: No upper extremity supported;During functional activity Standing balance-Leahy Scale: Poor Standing balance comment: Occasional assist required.                             Cognition Arousal/Alertness: Awake/alert Behavior During Therapy: WFL for tasks assessed/performed Overall Cognitive Status: Within Functional Limits for tasks assessed                                        Exercises      General Comments        Pertinent Vitals/Pain Pain Assessment: No/denies pain    Home Living Family/patient expects to be discharged to:: Private residence Living Arrangements: Alone Available Help at Discharge: Friend(s);Available PRN/intermittently Type of Home: Apartment Home Access: Level entry;Elevator   Home Layout: One level Home Equipment: None      Prior Function Level of Independence: Independent          PT Goals (current goals can now be  found in the care plan section) Acute Rehab PT Goals Patient Stated Goal: Return home after rehab PT Goal Formulation: With patient Time For Goal Achievement: 03/03/17 Potential to Achieve Goals: Good    Frequency    Min 3X/week      PT Plan      Co-evaluation              AM-PAC PT "6 Clicks" Daily Activity  Outcome Measure  Difficulty turning over in bed (including adjusting bedclothes, sheets and blankets)?: A Little Difficulty moving from lying on back to sitting on the side of the bed? :  Unable Difficulty sitting down on and standing up from a chair with arms (e.g., wheelchair, bedside commode, etc,.)?: A Little Help needed moving to and from a bed to chair (including a wheelchair)?: A Little Help needed walking in hospital room?: A Little Help needed climbing 3-5 steps with a railing? : A Lot 6 Click Score: 15    End of Session Equipment Utilized During Treatment: Gait belt Activity Tolerance: Patient tolerated treatment well Patient left: in chair;with call bell/phone within reach;with family/visitor present Nurse Communication: Mobility status PT Visit Diagnosis: Unsteadiness on feet (R26.81);Other abnormalities of gait and mobility (R26.89)     Time: 8916-9450 PT Time Calculation (min) (ACUTE ONLY): 25 min  Charges:  $Gait Training: 8-22 mins                    G Codes:       Rolinda Roan, PT, DPT Acute Rehabilitation Services Pager: [phone]    Thelma Comp 02/24/2017, 12:35 PM

## 2017-02-24 NOTE — Progress Notes (Signed)
PROGRESS NOTE [facility]   [name]   AST:[id number] DOB: [birth year]  DOA: 02/22/2017 PCP: Gayland Curry, DO   Brief Narrative:  [name] is a 82 year old female with medical history significant, dyslipidemia hypertension and hyperglycemiaPresented to the emergency department for ongoing shoulder pain patient was treated with prednisone taper for 5 days which was not helping.  Subsequently she developed fever of 102.4.  Upon ED evaluation white count was elevated, CT of the shoulder show a fluid collection with bursitis patient was admitted for septic joint.  Orthopedic surgery was consulted  Subjective: Patient seen and examined, she is status post I&D of the left shoulder.  She report feeling significantly better, remains afebrile.  Denies pain and report increase range of movement.  Assessment & Plan: Septic left shoulder with abscess Status post I&D by orthopedic surgery Wound culture showing Streptococcus Patient was initially treated with ampicillin, ID consulted and recommended ceftriaxone while inpatient and amoxicillin upon discharge for 14 days.  Group A Streptococcal bacteremia Due to left shoulder abscess ID recommendations appreciated see above  Hypertension Stable BP meds on hold and BP remains ok   Hyperglycemia No diagnosis of diabetes mellitus We will check A1c continue to monitor for now  DVT prophylaxis: SCDs Code Status: Full code Family Communication: None at bedside Disposition Plan: Home in the next 24-48 hours  Consultants:   Orthopedic surgery  Infectious disease  Procedures:   IND on 02/23/2017  Antimicrobials: Anti-infectives (From admission, onward)   Start     Dose/Rate Route Frequency Ordered Stop   02/25/17 0600  vancomycin (VANCOCIN) IVPB 750 mg/150 ml premix  Status:  Discontinued     750 mg 150 mL/hr over 60 Minutes Intravenous Every 48 hours 02/23/17 0243 02/24/17 0036   02/24/17 1500  penicillin G potassium 4 Million  Units in dextrose 5 % 250 mL IVPB  Status:  Discontinued     4 Million Units 250 mL/hr over 60 Minutes Intravenous Every 6 hours 02/24/17 1223 02/24/17 1353   02/24/17 1500  cefTRIAXone (ROCEPHIN) 2 g in dextrose 5 % 50 mL IVPB     2 g 100 mL/hr over 30 Minutes Intravenous Every 24 hours 02/24/17 1353     02/24/17 0100  penicillin G potassium 4 Million Units in dextrose 5 % 250 mL IVPB  Status:  Discontinued     4 Million Units 250 mL/hr over 60 Minutes Intravenous Every 4 hours 02/24/17 0028 02/24/17 1223   02/23/17 2200  cefTRIAXone (ROCEPHIN) 1 g in dextrose 5 % 50 mL IVPB  Status:  Discontinued     1 g 100 mL/hr over 30 Minutes Intravenous Daily at bedtime 02/23/17 0219 02/23/17 0229   02/23/17 0800  cefTRIAXone (ROCEPHIN) 2 g in dextrose 5 % 50 mL IVPB  Status:  Discontinued     2 g 100 mL/hr over 30 Minutes Intravenous Every 24 hours 02/23/17 0751 02/24/17 0022   02/23/17 0130  vancomycin (VANCOCIN) IVPB 1000 mg/200 mL premix     1,000 mg 200 mL/hr over 60 Minutes Intravenous Every 8 hours 02/23/17 0128 02/23/17 0234           Objective: Vitals:   02/23/17 2342 02/24/17 0400 02/24/17 0832 02/24/17 1524  BP: 124/68 106/71 117/84 106/68  Pulse: 86 73 79 85  Resp: 18 18 18 18   Temp: 98 F (36.7 C) 98.3 F (36.8 C) 98.3 F (36.8 C) 97.7 F (36.5 C)  TempSrc: Oral Oral  Oral  SpO2: 98% 99%  100% 100%  Weight:      Height:        Intake/Output Summary (Last 24 hours) at 02/24/2017 1555 Last data filed at 02/24/2017 1446 Gross per 24 hour  Intake 1030 ml  Output 15 ml  Net 1015 ml   Filed Weights   02/22/17 1609  Weight: 47.2 kg (104 lb)    Examination:  General exam: Appears calm and comfortable  Respiratory system: Clear to auscultation. No wheezes,crackle or rhonchi Cardiovascular system: S1 & S2 heard, RRR. No JVD, murmurs, rubs or gallops Gastrointestinal system: Abdomen is nondistended, soft and nontender.  Central nervous system: Alert and oriented. No  focal neurological deficits. Extremities: No pedal edema. Skin: No rashes, lesions or ulcers Psychiatry: Mood & affect appropriate.    Data Reviewed: I have personally reviewed following labs and imaging studies  CBC: Recent Labs  Lab 02/22/17 2016 02/23/17 0523 02/23/17 1624 02/24/17 0515  WBC 32.9* 25.2*  --  21.7*  NEUTROABS 31.2*  --   --  20.9*  HGB 11.5* 10.9* 11.6* 9.1*  HCT 34.9* 33.8* 34.0* 29.4*  MCV 81.2 81.8  --  82.6  PLT 385 404*  --  160   Basic Metabolic Panel: Recent Labs  Lab 02/22/17 2016 02/23/17 0523 02/23/17 1624 02/24/17 0515  NA 132* 133* 137 135  K 3.0* 2.7* 3.4* 4.1  CL 94* 99*  --  101  CO2 27 26  --  25  GLUCOSE 171* 150* 116* 200*  BUN 20 19  --  17  CREATININE 1.15* 1.04*  --  1.04*  CALCIUM 8.2* 7.8*  --  7.6*  MG  --   --   --  1.5*   GFR: Estimated Creatinine Clearance: 32.7 mL/min (A) (by C-G formula based on SCr of 1.04 mg/dL (H)). Liver Function Tests: Recent Labs  Lab 02/22/17 2016  AST 25  ALT 21  ALKPHOS 138*  BILITOT 0.9  PROT 7.1  ALBUMIN 2.4*   No results for input(s): LIPASE, AMYLASE in the last 168 hours. No results for input(s): AMMONIA in the last 168 hours. Coagulation Profile: No results for input(s): INR, PROTIME in the last 168 hours. Cardiac Enzymes: No results for input(s): CKTOTAL, CKMB, CKMBINDEX, TROPONINI in the last 168 hours. BNP (last 3 results) No results for input(s): PROBNP in the last 8760 hours. HbA1C: No results for input(s): HGBA1C in the last 72 hours. CBG: No results for input(s): GLUCAP in the last 168 hours. Lipid Profile: No results for input(s): CHOL, HDL, LDLCALC, TRIG, CHOLHDL, LDLDIRECT in the last 72 hours. Thyroid Function Tests: No results for input(s): TSH, T4TOTAL, FREET4, T3FREE, THYROIDAB in the last 72 hours. Anemia Panel: No results for input(s): VITAMINB12, FOLATE, FERRITIN, TIBC, IRON, RETICCTPCT in the last 72 hours. Sepsis Labs: Recent Labs  Lab  02/22/17 2030  LATICACIDVEN 1.29    Recent Results (from the past 240 hour(s))  Blood Culture (routine x 2)     Status: None (Preliminary result)   Collection Time: 02/22/17 10:56 PM  Result Value Ref Range Status   Specimen Description BLOOD LEFT ANTECUBITAL  Final   Special Requests   Final    BOTTLES DRAWN AEROBIC AND ANAEROBIC Blood Culture adequate volume   Culture  Setup Time   Final    GRAM POSITIVE COCCI IN CHAINS IN BOTH AEROBIC AND ANAEROBIC BOTTLES CRITICAL RESULT CALLED TO, READ BACK BY AND VERIFIED WITH: A. MASTERS PHARMD, AT 2350 02/23/17 BY D. VANHOOK    Culture  Final    GRAM POSITIVE COCCI CULTURE REINCUBATED FOR BETTER GROWTH Performed at [facility], [place] [address], [place], [place] [address]    Report Status PENDING  Incomplete  Blood Culture (routine x 2)     Status: Abnormal (Preliminary result)   Collection Time: 02/22/17 10:56 PM  Result Value Ref Range Status   Specimen Description BLOOD RIGHT WRIST  Final   Special Requests IN PEDIATRIC BOTTLE Blood Culture adequate volume  Final   Culture  Setup Time   Final    GRAM POSITIVE COCCI IN CHAINS IN PEDIATRIC BOTTLE CRITICAL RESULT CALLED TO, READ BACK BY AND VERIFIED WITH: A. MASTERS PHARMD, AT 2350 02/23/17 BY D. VANHOOK    Culture (A)  Final    GROUP A STREP (S.PYOGENES) ISOLATED SUSCEPTIBILITIES TO FOLLOW Performed at [facility], [place] [address], [place], [place] [address]    Report Status PENDING  Incomplete  Blood Culture ID Panel (Reflexed)     Status: Abnormal   Collection Time: 02/22/17 10:56 PM  Result Value Ref Range Status   Enterococcus species NOT DETECTED NOT DETECTED Final   Listeria monocytogenes NOT DETECTED NOT DETECTED Final   Staphylococcus species NOT DETECTED NOT DETECTED Final   Staphylococcus aureus NOT DETECTED NOT DETECTED Final   Streptococcus species DETECTED (A) NOT DETECTED Final    Comment: CRITICAL RESULT CALLED TO, READ BACK BY AND VERIFIED WITH: A.  MASTERS PHARMD, AT 2350 02/23/17 BY D. VANHOOK    Streptococcus agalactiae NOT DETECTED NOT DETECTED Final   Streptococcus pneumoniae NOT DETECTED NOT DETECTED Final   Streptococcus pyogenes DETECTED (A) NOT DETECTED Final    Comment: CRITICAL RESULT CALLED TO, READ BACK BY AND VERIFIED WITH: A. MASTERS PHARMD, AT 2350 02/23/17 BY D. VANHOOK    Acinetobacter baumannii NOT DETECTED NOT DETECTED Final   Enterobacteriaceae species NOT DETECTED NOT DETECTED Final   Enterobacter cloacae complex NOT DETECTED NOT DETECTED Final   Escherichia coli NOT DETECTED NOT DETECTED Final   Klebsiella oxytoca NOT DETECTED NOT DETECTED Final   Klebsiella pneumoniae NOT DETECTED NOT DETECTED Final   Proteus species NOT DETECTED NOT DETECTED Final   Serratia marcescens NOT DETECTED NOT DETECTED Final   Haemophilus influenzae NOT DETECTED NOT DETECTED Final   Neisseria meningitidis NOT DETECTED NOT DETECTED Final   Pseudomonas aeruginosa NOT DETECTED NOT DETECTED Final   Candida albicans NOT DETECTED NOT DETECTED Final   Candida glabrata NOT DETECTED NOT DETECTED Final   Candida krusei NOT DETECTED NOT DETECTED Final   Candida parapsilosis NOT DETECTED NOT DETECTED Final   Candida tropicalis NOT DETECTED NOT DETECTED Final    Comment: Performed at [facility], [place] [address] [place], [place] [address]  Body fluid culture     Status: None (Preliminary result)   Collection Time: 02/23/17  1:17 AM  Result Value Ref Range Status   Specimen Description SYNOVIAL Bursa  Final   Special Requests NONE  Final   Gram Stain   Final    ABUNDANT WBC PRESENT, PREDOMINANTLY PMN FEW GRAM POSITIVE COCCI IN CHAINS Gram Stain Report Called to,Read Back By and Verified With: COGGINS,E RN 1.8.19 @0225  ZANDOC    Culture   Final    MODERATE UNIDENTIFIED ORGANISM Performed at [facility], [place] [address], [place], [place] [address]    Report Status PENDING  Incomplete      Radiology Studies: Ct Shoulder Left  Wo Contrast  Result Date: 02/23/2017 CLINICAL DATA:  82 year old  female with left shoulder pain. No known injury. EXAM: CT OF THE UPPER LEFT EXTREMITY WITHOUT CONTRAST TECHNIQUE: Multidetector CT imaging of the upper left extremity was performed according to the standard protocol. COMPARISON:  Radiographs of the left shoulder from earlier on the same day. FINDINGS: Bones/Joint/Cartilage No acute fracture is identified of the left shoulder. Slight dorsal subluxation of the humeral head relative to the glenoid likely from mass effect from adjacent presumed large periarticular/ bursal fluid collection. No suspicious osseous lesion is seen. Ligaments Suboptimally assessed by CT. Muscles and Tendons Overlying the subscapularis muscle is a masslike hypodensity suspicious for tracking of what may represent a large subacromial and subdeltoid bursal fluid collection. This measures approximately 7.7 x 3.9 x 6.6 cm on series 7, image 35 and coronal image 23. This finding may represent stigmata of a large bursitis. Intramuscular mass cannot be entirely excluded given limitations of a noncontrast study. Crescentic subtle hypodensity deep to the deltoid muscle is also noted on the coronal oblique and sagittal views causing soft tissue prominence and swelling. There is also likely reflects stigmata of a large sub deltoid and subacromial bursal fluid collection. Non emergent MRI without and with contrast would help further correlation and to exclude intrinsic masses or adenopathy involving or adjacent to the subscapularis. IMPRESSION: 1. Lack of intravenous contrast limits assessment. Subtle hypodensity overlies the subscapularis tendon believed to represent continuation of a large subacromial and subdeltoid bursal fluid collection likely from enlarged bursitis. This measures approximately 7.7 x 3.9 x 6.6 cm. Alternatively, subacute intramuscular hematoma without active hemorrhage might have a similar appearance appear An  eccentric intramuscular mass of the subscapularis, or potentially adjacent adenopathy is not entirely excluded but believed less likely. Subtle low attenuation favors fluid. Findings may represent stigmata of a large subacromial and subdeltoid bursitis. Suggest non emergent MRI for better assessment however. 2. Soft tissue thickening likely related to tracking of fluid within the subdeltoid bursa as well. 3. No acute osseous abnormality of the left shoulder. No suspicious osseous lesions. Electronically Signed   By: Ashley Royalty M.D.   On: 02/23/2017 00:16   Dg Shoulder Left  Result Date: 02/22/2017 CLINICAL DATA:  Progressive left shoulder pain for 1 week. Fever. Possible septic joint. EXAM: LEFT SHOULDER - 2+ VIEW COMPARISON:  None. FINDINGS: The bones appear mildly demineralized. No evidence of acute fracture, dislocation or bone destruction. Mild inferior subluxation of the humeral head relative to the glenoid may indicate the presence of a joint effusion. There is possible soft tissue swelling around the shoulder. Aortic atherosclerosis noted. Mild midthoracic compression deformity, age indeterminate. IMPRESSION: Suspected joint effusion and periarticular soft tissue swelling; joint aspiration recommended if clinical concern of septic joint. No acute osseous findings or signs of osteomyelitis. Electronically Signed   By: Richardean Sale M.D.   On: 02/22/2017 18:16      Scheduled Meds: Continuous Infusions: . sodium chloride 100 mL/hr at 02/23/17 2010  . cefTRIAXone (ROCEPHIN)  IV Stopped (02/24/17 1516)  . lactated ringers       LOS: 1 day    Time spent: Total of 25 minutes spent with pt, greater than 50% of which was spent in discussion of  treatment, counseling and coordination of care  Chipper Oman, MD Pager: Text Page via [email or web address]   If 7PM-7AM, please contact night-coverage [email or web address] 02/24/2017, 3:55 PM

## 2017-02-24 NOTE — Evaluation (Signed)
Occupational Therapy Evaluation Patient Details Name: [name] MRN: [id number] DOB: Mar 24, 1935 Today's Date: 02/24/2017    History of Present Illness Pt is a 82 y/o female who presents with a L septic shoulder. She is now s/p I&D of L shoulder on 02/23/17.    Clinical Impression   Patient is s/p I&D shoulder L surgery resulting in functional limitations due to the deficits listed below (see OT problem list). Pt will have boyfriend (A) upon d/c for all iadl and a close friend for adls. Pt will need min (A) UB and mod (A) LB. Educated on movement and provided handout.  Patient will benefit from skilled OT acutely to increase independence and safety with ADLS to allow discharge SNF (pending IV antibiotics with adl (A) needed).     Follow Up Recommendations  No OT follow up    Equipment Recommendations  None recommended by OT    Recommendations for Other Services       Precautions / Restrictions Precautions Precautions: Fall Restrictions Weight Bearing Restrictions: No      Mobility Bed Mobility Overal bed mobility: Needs Assistance Bed Mobility: Sit to Supine     Supine to sit: Supervision     General bed mobility comments: Pt was able to transition to EOB without assistance. HOB flat however rails used for assist with RUE.   Transfers Overall transfer level: Needs assistance Equipment used: None Transfers: Sit to/from Stand Sit to Stand: Min guard         General transfer comment: incr time but able to complete task    Balance Overall balance assessment: Needs assistance Sitting-balance support: Feet supported;No upper extremity supported Sitting balance-Leahy Scale: Fair     Standing balance support: No upper extremity supported;During functional activity Standing balance-Leahy Scale: Poor Standing balance comment: Occasional assist required.                            ADL either performed or assessed with clinical judgement   ADL Overall ADL's  : Needs assistance/impaired Eating/Feeding: Set up   Grooming: Set up   Upper Body Bathing: Minimal assistance   Lower Body Bathing: Moderate assistance           Toilet Transfer: Min guard           Functional mobility during ADLs: Min guard General ADL Comments: pt and boyfriend educated on bathing and dressing .    Educated patient on never to wash directly on incision site, avoid water under bandage and benefits of wrapping dressing, sleeping positioning     Vision Baseline Vision/History: No visual deficits       Perception     Praxis      Pertinent Vitals/Pain Pain Assessment: No/denies pain     Hand Dominance Right   Extremity/Trunk Assessment Upper Extremity Assessment Upper Extremity Assessment: RUE deficits/detail;LUE deficits/detail RUE Deficits / Details: R UE 100 degrees reports changes 10 days ago (current date 02/24/17) LUE Deficits / Details: pt able to [facility] with R UE to 90 degrees. noted to have edema in forearm and upper arm at this time.    Lower Extremity Assessment Lower Extremity Assessment: Defer to PT evaluation   Cervical / Trunk Assessment Cervical / Trunk Assessment: Normal(Noted forward head/rounded shoulder posture)   Communication Communication Communication: No difficulties   Cognition Arousal/Alertness: Awake/alert Behavior During Therapy: WFL for tasks assessed/performed Overall Cognitive Status: Within Functional Limits for tasks assessed  General Comments       Exercises Exercises: Shoulder Shoulder Exercises Shoulder Flexion: Self ROM;Left;10 reps;Seated Shoulder ABduction: Self ROM;Left;10 reps;Seated   Shoulder Instructions Shoulder Instructions Donning/doffing shirt without moving shoulder: Minimal assistance Method for sponge bathing under operated UE: Minimal assistance ROM for elbow, wrist and digits of operated UE: Independent Positioning of UE while  sleeping: Minimal assistance    Home Living Family/patient expects to be discharged to:: Private residence Living Arrangements: Alone Available Help at Discharge: Friend(s);Available PRN/intermittently Type of Home: Apartment Home Access: Level entry;Elevator     Home Layout: One level     Bathroom Shower/Tub: Teacher, early years/pre: Standard     Home Equipment: None   Additional Comments: will have (A) of boyfriend, pt reports having a large church family that can (A) as well      Prior Functioning/Environment Level of Independence: Independent                 OT Problem List: Decreased strength;Decreased activity tolerance;Impaired balance (sitting and/or standing)      OT Treatment/Interventions: Self-care/ADL training;Therapeutic exercise;DME and/or AE instruction;Therapeutic activities;Patient/family education;Balance training    OT Goals(Current goals can be found in the care plan section) Acute Rehab OT Goals Patient Stated Goal: Return home after rehab OT Goal Formulation: With patient/family Time For Goal Achievement: 03/10/17 Potential to Achieve Goals: Good  OT Frequency: Min 2X/week   Barriers to D/C:            Co-evaluation              AM-PAC PT "6 Clicks" Daily Activity     Outcome Measure Help from another person eating meals?: None Help from another person taking care of personal grooming?: A Little Help from another person toileting, which includes using toliet, bedpan, or urinal?: A Little Help from another person bathing (including washing, rinsing, drying)?: A Little Help from another person to put on and taking off regular upper body clothing?: A Little Help from another person to put on and taking off regular lower body clothing?: A Little 6 Click Score: 19   End of Session Nurse Communication: Mobility status;Precautions  Activity Tolerance: Patient tolerated treatment well Patient left: in bed;with call  bell/phone within reach;with family/visitor present  OT Visit Diagnosis: Unsteadiness on feet (R26.81)                Time: 9628-3662 OT Time Calculation (min): 27 min Charges:  OT General Charges $OT Visit: 1 Visit OT Evaluation $OT Eval Moderate Complexity: 1 Mod OT Treatments $Therapeutic Exercise: 8-22 mins G-Codes:      Jeri Modena   OTR/L Pager: [phone] Office: [phone] .   Parke Poisson B 02/24/2017, 3:55 PM

## 2017-02-24 NOTE — Op Note (Signed)
NAME:  [name], [name]                     ACCOUNT NO.:  [id number]  MEDICAL RECORD NO.:  [id number]  LOCATION:                                 FACILITY:  PHYSICIAN:  Lind Guest. Ninfa Linden, M.D.DATE OF BIRTH:  DATE OF PROCEDURE:  02/23/2017 DATE OF DISCHARGE:                              OPERATIVE REPORT   PREOPERATIVE DIAGNOSIS:  Deep left shoulder abscess.  POSTOPERATIVE DIAGNOSIS:  Deep left shoulder abscess.  PROCEDURE:  Incision and debridement with irrigation of left shoulder abscess.  FINDINGS:  Large purulent abscess in the subscapular area on the left shoulder.  Sharp excisional debridement with scalpel of necrotic bursa and fascia.  SURGEON:  Lind Guest. Ninfa Linden, MD.  ASSISTANT:  Erskine Emery, PA-C.  ANESTHESIA:  General.  BLOOD LOSS:  Less than 100 mL.  COMPLICATIONS:  None.  INDICATIONS:  [name] is a 82 year old female, who presented to the emergency room yesterday with severe left shoulder pain.  She has seen her primary care physician for this and she was started on prednisone which is not uncommon with joint aches and pains.  However, she developed a significant fever and was told to go to the emergency room. In the emergency room last night, an ultrasound-guided aspiration showed purulence with gram-positive cocci.  She was admitted to the Medicine Service and Orthopedic Surgery was called this morning for further evaluation and treatment of the left shoulder abscess.  A CT scan showed a very large fluid collection in the subdeltoid and subscapular area of the shoulder.  I recommended more urgent irrigation and debridement of the abscess of the shoulder she presents for this now.  A thorough discussion of risks and benefits of surgery was had with her and her family and they understand the need to proceed.  DESCRIPTION OF PROCEDURE:  After informed consent was obtained, appropriate left shoulder was marked.  She was brought to the operating room and  placed supine on the operating table.  General anesthesia was then obtained.  Her left shoulder was prepped and draped with DuraPrep and sterile drapes including a sterile stockinette around the arm.  Time- out was called and she was identified as correct patient and correct left shoulder.  We then made an incision over the anterior shoulder for a deltopectoral approach of the shoulder itself.  As soon as we opened the deltopectoral approach deeply, an abundant amount of gross white purulent material was expressed from the shoulder.  Using suction, we were able to evacuate the abscess in its entirety.  I did use a #10 blade to sharply excise some necrotic bursa as well as a rongeur, but otherwise we just found a large soft tissue collection of abscess.  We thoroughly irrigated this area out with 3 L of normal saline solution and an additional one liter.  Hemostasis was obtained with electrocautery.  We loosely approximated the skin and placed well-padded sterile dressing.  She was awakened, extubated, and taken to the recovery room in stable condition.  All final counts were correct. There were no complications noted.  Postoperatively, she will need IV antibiotics for at least the next 48-72 hours while we cultures were  pending for sensitivities to determine what antibiotic she may need to be on for the next few weeks.     Lind Guest. Ninfa Linden, M.D.   ______________________________ Lind Guest. Ninfa Linden, M.D.    CYB/MEDQ  D:  02/23/2017  T:  02/24/2017  Job:  [id number]

## 2017-02-24 NOTE — Consult Note (Signed)
[facility]    Date of Admission:  02/22/2017    Total days of antibiotics 2        Day 1 penicillin               Reason for Consult: Group A streptococcal left shoulder abscess and bacteremia    Referring Provider: Dr. Patrecia Pour Primary Care Provider: Dr. Hollace Kinnier  Assessment: [name] was admitted with group A streptococcal bacteremia complicated by a large left shoulder abscess.  She had a thorough debridement by Dr. Ninfa Linden yesterday and is doing remarkably better.  She has defervesced and is not showing any signs of systemic toxicity.  Double coverage with clindamycin and a beta-lactam antibiotic is not needed at this time since her abscess has been drained and she is improving.  IV penicillin is a very effective choice in this setting but I feel it is safe and reasonable to simplify her dosing with once daily ceftriaxone while she remains hospitalized.  When she is ready for discharge she can convert to oral amoxicillin.  I would plan on 14 days of total therapy.  I do not feel that she needs follow-up blood cultures or an echocardiogram.  Plan: 1. Change IV gentamicin to ceftriaxone 2 g IV daily 2. I will follow with you  Active Problems:   Streptococcal bacteremia   Abscess of left shoulder   Essential hypertension, benign   Anxiety disorder   Recovering alcoholic in remission ([place])   Scheduled Meds: Continuous Infusions: . sodium chloride 100 mL/hr at 02/23/17 2010  . lactated ringers    . pencillin G potassium IV 4 Million Units (02/24/17 0906)   PRN Meds:.acetaminophen **OR** acetaminophen, fentaNYL (SUBLIMAZE) injection, ondansetron **OR** ondansetron (ZOFRAN) IV, oxyCODONE  HPI: [name] is a 82 y.o. female who has generally been in good health.  About 4-5 weeks ago she began to have some decreased appetite and generally did not feel well.  She has had problems with very dry mouth.  About 1 week ago she began to have some  left shoulder pain.  She denies any trauma to the shoulder.  She was seen by her primary care provider and treated with a steroid injection.  Her pain did not improve.  She began to have subjective fevers and chills.  She was seen again and her shoulder was swollen, red and warm leading to admission.  CT scan showed a large bursal fluid collection.  She was started on empiric antibiotic therapy and had surgery to drain a large abscess.  Admission blood cultures are growing group A streptococcus.  Abscess Gram stain shows gram-positive cocci in chains.  She has defervesced and is feeling much better.   Review of Systems: Review of Systems  Constitutional: Positive for chills, fever and malaise/fatigue. Negative for diaphoresis and weight loss.  HENT: Negative for congestion and sore throat.   Respiratory: Negative for cough, sputum production and shortness of breath.   Cardiovascular: Negative for chest pain.  Gastrointestinal: Negative for abdominal pain, diarrhea, nausea and vomiting.  Genitourinary: Negative for dysuria.  Musculoskeletal: Positive for joint pain. Negative for back pain.  Skin: Negative for rash.  Neurological: Negative for headaches.  Psychiatric/Behavioral: Negative for depression and substance abuse. The patient is nervous/anxious.     Past Medical History:  Diagnosis Date  . Anemia   . Anxiety   . Cholelithiasis   . Endometriosis   . Goiter   .  Herpes simplex   . Hyperlipidemia   . Hypertension   . Loss of weight   . Macular degeneration    both eyes  . Nontoxic multinodular goiter   . Other abnormal blood chemistry   . Peptic ulcer   . PUD (peptic ulcer disease)   . Renal cyst   . Sciatica   . Scoliosis (and kyphoscoliosis), idiopathic   . Unspecified disorder of kidney and ureter   . Urinary frequency     Social History   Tobacco Use  . Smoking status: Former Smoker    Last attempt to quit: 01/09/1994    Years since quitting: 23.1  . Smokeless  tobacco: Never Used  Substance Use Topics  . Alcohol use: No  . Drug use: No    Family History  Problem Relation Age of Onset  . Obesity Mother    No Known Allergies  OBJECTIVE: Blood pressure 117/84, pulse 79, temperature 98.3 F (36.8 C), resp. rate 18, height 5\' 4"  (1.626 m), weight 104 lb (47.2 kg), SpO2 100 %.  Physical Exam  Constitutional: She is oriented to person, place, and time.  She is resting comfortably in bed.  She is in good spirits.  Her boyfriend is visiting.  HENT:  Mouth/Throat: No oropharyngeal exudate.  Dry mouth.  Eyes: Conjunctivae are normal.  Neck: Neck supple.  Cardiovascular: Normal rate and regular rhythm.  No murmur heard. Pulmonary/Chest: Effort normal and breath sounds normal. She has no wheezes. She has no rales.  Abdominal: Soft. She exhibits no distension. There is no tenderness.  Musculoskeletal: She exhibits tenderness. She exhibits no edema.  Surgical dressing covering left anterior shoulder incision.  Neurological: She is alert and oriented to person, place, and time.  Skin: No rash noted.  Psychiatric: Mood and affect normal.    Lab Results Lab Results  Component Value Date   WBC 21.7 (H) 02/24/2017   HGB 9.1 (L) 02/24/2017   HCT 29.4 (L) 02/24/2017   MCV 82.6 02/24/2017   PLT 386 02/24/2017    Lab Results  Component Value Date   CREATININE 1.04 (H) 02/24/2017   BUN 17 02/24/2017   NA 135 02/24/2017   K 4.1 02/24/2017   CL 101 02/24/2017   CO2 25 02/24/2017    Lab Results  Component Value Date   ALT 21 02/22/2017   AST 25 02/22/2017   ALKPHOS 138 (H) 02/22/2017   BILITOT 0.9 02/22/2017     Microbiology: Recent Results (from the past 240 hour(s))  Blood Culture (routine x 2)     Status: None (Preliminary result)   Collection Time: 02/22/17 10:56 PM  Result Value Ref Range Status   Specimen Description BLOOD LEFT ANTECUBITAL  Final   Special Requests   Final    BOTTLES DRAWN AEROBIC AND ANAEROBIC Blood Culture  adequate volume   Culture  Setup Time   Final    GRAM POSITIVE COCCI IN CHAINS IN BOTH AEROBIC AND ANAEROBIC BOTTLES CRITICAL RESULT CALLED TO, READ BACK BY AND VERIFIED WITH: A. MASTERS PHARMD, AT 2350 02/23/17 BY D. VANHOOK    Culture   Final    GRAM POSITIVE COCCI CULTURE REINCUBATED FOR BETTER GROWTH Performed at [facility], [place] [address], [place], [place] [address]    Report Status PENDING  Incomplete  Blood Culture (routine x 2)     Status: Abnormal (Preliminary result)   Collection Time: 02/22/17 10:56 PM  Result Value Ref Range Status   Specimen Description BLOOD RIGHT WRIST  Final   Special Requests IN PEDIATRIC BOTTLE Blood Culture adequate volume  Final   Culture  Setup Time   Final    GRAM POSITIVE COCCI IN CHAINS IN PEDIATRIC BOTTLE CRITICAL RESULT CALLED TO, READ BACK BY AND VERIFIED WITH: A. MASTERS PHARMD, AT 2350 02/23/17 BY D. VANHOOK    Culture (A)  Final    GROUP A STREP (S.PYOGENES) ISOLATED SUSCEPTIBILITIES TO FOLLOW Performed at [facility], [place] [address], [place], [place] [address]    Report Status PENDING  Incomplete  Blood Culture ID Panel (Reflexed)     Status: Abnormal   Collection Time: 02/22/17 10:56 PM  Result Value Ref Range Status   Enterococcus species NOT DETECTED NOT DETECTED Final   Listeria monocytogenes NOT DETECTED NOT DETECTED Final   Staphylococcus species NOT DETECTED NOT DETECTED Final   Staphylococcus aureus NOT DETECTED NOT DETECTED Final   Streptococcus species DETECTED (A) NOT DETECTED Final    Comment: CRITICAL RESULT CALLED TO, READ BACK BY AND VERIFIED WITH: A. MASTERS PHARMD, AT 2350 02/23/17 BY D. VANHOOK    Streptococcus agalactiae NOT DETECTED NOT DETECTED Final   Streptococcus pneumoniae NOT DETECTED NOT DETECTED Final   Streptococcus pyogenes DETECTED (A) NOT DETECTED Final    Comment: CRITICAL RESULT CALLED TO, READ BACK BY AND VERIFIED WITH: A. MASTERS PHARMD, AT 2350 02/23/17 BY D. VANHOOK     Acinetobacter baumannii NOT DETECTED NOT DETECTED Final   Enterobacteriaceae species NOT DETECTED NOT DETECTED Final   Enterobacter cloacae complex NOT DETECTED NOT DETECTED Final   Escherichia coli NOT DETECTED NOT DETECTED Final   Klebsiella oxytoca NOT DETECTED NOT DETECTED Final   Klebsiella pneumoniae NOT DETECTED NOT DETECTED Final   Proteus species NOT DETECTED NOT DETECTED Final   Serratia marcescens NOT DETECTED NOT DETECTED Final   Haemophilus influenzae NOT DETECTED NOT DETECTED Final   Neisseria meningitidis NOT DETECTED NOT DETECTED Final   Pseudomonas aeruginosa NOT DETECTED NOT DETECTED Final   Candida albicans NOT DETECTED NOT DETECTED Final   Candida glabrata NOT DETECTED NOT DETECTED Final   Candida krusei NOT DETECTED NOT DETECTED Final   Candida parapsilosis NOT DETECTED NOT DETECTED Final   Candida tropicalis NOT DETECTED NOT DETECTED Final    Comment: Performed at [facility], [place] [address], [place], [place] [address]  Body fluid culture     Status: None (Preliminary result)   Collection Time: 02/23/17  1:17 AM  Result Value Ref Range Status   Specimen Description SYNOVIAL Bursa  Final   Special Requests NONE  Final   Gram Stain   Final    ABUNDANT WBC PRESENT, PREDOMINANTLY PMN FEW GRAM POSITIVE COCCI IN CHAINS Gram Stain Report Called to,Read Back By and Verified With: COGGINS,E RN 1.8.19 @0225  ZANDOC    Culture   Final    MODERATE UNIDENTIFIED ORGANISM Performed at [facility], [place] [address], [place],  [address]    Report Status PENDING  Incomplete    Michel Bickers, MD [facility] [phone] pager   [phone] cell 02/24/2017, 11:29 AM

## 2017-02-24 NOTE — Progress Notes (Signed)
Subjective: 1 Day Post-Op Procedure(s) (LRB): IRRIGATION AND DEBRIDEMENT ABSCESS (Left) Patient reports pain as mild.  States left shoulder pain and range of motion improved.   Objective: Vital signs in last 24 hours: Temp:  [97.5 F (36.4 C)-98.7 F (37.1 C)] 98.3 F (36.8 C) (01/09 0832) Pulse Rate:  [73-103] 79 (01/09 0832) Resp:  [11-19] 18 (01/09 0832) BP: (106-149)/(68-84) 117/84 (01/09 0832) SpO2:  [94 %-100 %] 100 % (01/09 0832)  Intake/Output from previous day: 01/08 0701 - 01/09 0700 In: 2130 [P.O.:60; I.V.:1920; IV Piggyback:150] Out: 15 [Blood:15] Intake/Output this shift: No intake/output data recorded.  Recent Labs    02/22/17 2016 02/23/17 0523 02/23/17 1624 02/24/17 0515  HGB 11.5* 10.9* 11.6* 9.1*   Recent Labs    02/23/17 0523 02/23/17 1624 02/24/17 0515  WBC 25.2*  --  21.7*  RBC 4.13  --  3.56*  HCT 33.8* 34.0* 29.4*  PLT 404*  --  386   Recent Labs    02/23/17 0523 02/23/17 1624 02/24/17 0515  NA 133* 137 135  K 2.7* 3.4* 4.1  CL 99*  --  101  CO2 26  --  25  BUN 19  --  17  CREATININE 1.04*  --  1.04*  GLUCOSE 150* 116* 200*  CALCIUM 7.8*  --  7.6*   No results for input(s): LABPT, INR in the last 72 hours.   Left shoulder: Forward flexion passively to 120 degrees and improved external rotation with minimal discomfort. GROM of left elbow. Dressing left shoulder clean dry and intact.  Assessment/Plan: 1 Day Post-Op Procedure(s) (LRB): IRRIGATION AND DEBRIDEMENT ABSCESS (Left) Up with therapy PT/OT for range of motion left shoulder   Kaitlyn Skowron 02/24/2017, 8:50 AM

## 2017-02-25 ENCOUNTER — Encounter (HOSPITAL_COMMUNITY): Payer: Self-pay | Admitting: General Practice

## 2017-02-25 LAB — CBC WITH DIFFERENTIAL/PLATELET
BASOS ABS: 0 10*3/uL (ref 0.0–0.1)
BASOS PCT: 0 %
Eosinophils Absolute: 0 10*3/uL (ref 0.0–0.7)
Eosinophils Relative: 0 %
HEMATOCRIT: 27.2 % — AB (ref 36.0–46.0)
Hemoglobin: 8.4 g/dL — ABNORMAL LOW (ref 12.0–15.0)
Lymphocytes Relative: 10 %
Lymphs Abs: 2.2 10*3/uL (ref 0.7–4.0)
MCH: 25.3 pg — ABNORMAL LOW (ref 26.0–34.0)
MCHC: 30.9 g/dL (ref 30.0–36.0)
MCV: 81.9 fL (ref 78.0–100.0)
MONO ABS: 0.9 10*3/uL (ref 0.1–1.0)
Monocytes Relative: 4 %
NEUTROS ABS: 19.1 10*3/uL — AB (ref 1.7–7.7)
Neutrophils Relative %: 86 %
PLATELETS: 493 10*3/uL — AB (ref 150–400)
RBC: 3.32 MIL/uL — ABNORMAL LOW (ref 3.87–5.11)
RDW: 15.5 % (ref 11.5–15.5)
WBC: 22.2 10*3/uL — ABNORMAL HIGH (ref 4.0–10.5)

## 2017-02-25 LAB — CULTURE, BLOOD (ROUTINE X 2): Special Requests: ADEQUATE

## 2017-02-25 LAB — HEMOGLOBIN A1C
HEMOGLOBIN A1C: 6.6 % — AB (ref 4.8–5.6)
MEAN PLASMA GLUCOSE: 142.72 mg/dL

## 2017-02-25 MED ORDER — SODIUM CHLORIDE 0.9 % IV SOLN
INTRAVENOUS | Status: DC
  Administered 2017-02-25: 11:00:00 via INTRAVENOUS

## 2017-02-25 MED ORDER — SODIUM CHLORIDE 0.45 % IV SOLN: INTRAVENOUS | Status: DC

## 2017-02-25 NOTE — Progress Notes (Signed)
PROGRESS NOTE [facility]   [name]   CBS:[id number] DOB: 1935/04/12  DOA: 02/22/2017 PCP: Gayland Curry, DO   Brief Narrative:  [name] is a 82 year old female with medical history significant, dyslipidemia hypertension and hyperglycemiaPresented to the emergency department for ongoing shoulder pain patient was treated with prednisone taper for 5 days which was not helping.  Subsequently she developed fever of 102.4.  Upon ED evaluation white count was elevated, CT of the shoulder show a fluid collection with bursitis patient was admitted for septic joint.  Orthopedic surgery was consulted  Subjective: Patient seen and examined, she is doing well. No complaints today. WBC remains elevated. Afebrile. No acute events   Assessment & Plan: Septic left shoulder with abscess Status post I&D by orthopedic surgery Wound culture showing Streptococcus Plan to continue Rocephin for 1 more day, then switch to amox to complete 14 days of abx  ID recommendations appreciated  Monitor CBC in AM   Group A Streptococcal bacteremia Due to left shoulder abscess ID recommendations appreciated see above  Hypertension Stable BP meds on hold and BP remains ok    Hyperglycemia No diagnosis of diabetes mellitus We will check A1c continue to monitor for now  DVT prophylaxis: SCDs Code Status: Full code Family Communication: None at bedside Disposition Plan: SNF in AM if WBC trending down   Consultants:   Orthopedic surgery  Infectious disease  Procedures:   IND on 02/23/2017  Antimicrobials: Anti-infectives (From admission, onward)   Start     Dose/Rate Route Frequency Ordered Stop   02/25/17 0600  vancomycin (VANCOCIN) IVPB 750 mg/150 ml premix  Status:  Discontinued     750 mg 150 mL/hr over 60 Minutes Intravenous Every 48 hours 02/23/17 0243 02/24/17 0036   02/24/17 1500  penicillin G potassium 4 Million Units in dextrose 5 % 250 mL IVPB  Status:  Discontinued     4 Million  Units 250 mL/hr over 60 Minutes Intravenous Every 6 hours 02/24/17 1223 02/24/17 1353   02/24/17 1500  cefTRIAXone (ROCEPHIN) 2 g in dextrose 5 % 50 mL IVPB     2 g 100 mL/hr over 30 Minutes Intravenous Every 24 hours 02/24/17 1353     02/24/17 0100  penicillin G potassium 4 Million Units in dextrose 5 % 250 mL IVPB  Status:  Discontinued     4 Million Units 250 mL/hr over 60 Minutes Intravenous Every 4 hours 02/24/17 0028 02/24/17 1223   02/23/17 2200  cefTRIAXone (ROCEPHIN) 1 g in dextrose 5 % 50 mL IVPB  Status:  Discontinued     1 g 100 mL/hr over 30 Minutes Intravenous Daily at bedtime 02/23/17 0219 02/23/17 0229   02/23/17 0800  cefTRIAXone (ROCEPHIN) 2 g in dextrose 5 % 50 mL IVPB  Status:  Discontinued     2 g 100 mL/hr over 30 Minutes Intravenous Every 24 hours 02/23/17 0751 02/24/17 0022   02/23/17 0130  vancomycin (VANCOCIN) IVPB 1000 mg/200 mL premix     1,000 mg 200 mL/hr over 60 Minutes Intravenous Every 8 hours 02/23/17 0128 02/23/17 0234          Objective: Vitals:   02/24/17 1524 02/24/17 2104 02/25/17 0509 02/25/17 1100  BP: 106/68 (!) 152/75 130/67 137/64  Pulse: 85 80 81 82  Resp: 18 18 18    Temp: 97.7 F (36.5 C) 98 F (36.7 C) 97.8 F (36.6 C) 98.1 F (36.7 C)  TempSrc: Oral Oral Oral Oral  SpO2: 100% 100%  99%   Weight:      Height:        Intake/Output Summary (Last 24 hours) at 02/25/2017 1429 Last data filed at 02/25/2017 0900 Gross per 24 hour  Intake 410 ml  Output -  Net 410 ml   Filed Weights   02/22/17 1609  Weight: 47.2 kg (104 lb)    Examination:  General: Pt is alert, awake, not in acute distress Cardiovascular: RRR, S1/S2 +, no rubs, no gallops Respiratory: CTA bilaterally, no wheezing, no rhonchi Abdominal: Soft, NT, ND, bowel sounds + Extremities: no edema  Data Reviewed: I have personally reviewed following labs and imaging studies  CBC: Recent Labs  Lab 02/22/17 2016 02/23/17 0523 02/23/17 1624 02/24/17 0515  02/25/17 0512  WBC 32.9* 25.2*  --  21.7* 22.2*  NEUTROABS 31.2*  --   --  20.9* 19.1*  HGB 11.5* 10.9* 11.6* 9.1* 8.4*  HCT 34.9* 33.8* 34.0* 29.4* 27.2*  MCV 81.2 81.8  --  82.6 81.9  PLT 385 404*  --  386 329*   Basic Metabolic Panel: Recent Labs  Lab 02/22/17 2016 02/23/17 0523 02/23/17 1624 02/24/17 0515  NA 132* 133* 137 135  K 3.0* 2.7* 3.4* 4.1  CL 94* 99*  --  101  CO2 27 26  --  25  GLUCOSE 171* 150* 116* 200*  BUN 20 19  --  17  CREATININE 1.15* 1.04*  --  1.04*  CALCIUM 8.2* 7.8*  --  7.6*  MG  --   --   --  1.5*   GFR: Estimated Creatinine Clearance: 32.7 mL/min (A) (by C-G formula based on SCr of 1.04 mg/dL (H)). Liver Function Tests: Recent Labs  Lab 02/22/17 2016  AST 25  ALT 21  ALKPHOS 138*  BILITOT 0.9  PROT 7.1  ALBUMIN 2.4*   No results for input(s): LIPASE, AMYLASE in the last 168 hours. No results for input(s): AMMONIA in the last 168 hours. Coagulation Profile: No results for input(s): INR, PROTIME in the last 168 hours. Cardiac Enzymes: No results for input(s): CKTOTAL, CKMB, CKMBINDEX, TROPONINI in the last 168 hours. BNP (last 3 results) No results for input(s): PROBNP in the last 8760 hours. HbA1C: Recent Labs    02/25/17 0512  HGBA1C 6.6*   CBG: No results for input(s): GLUCAP in the last 168 hours. Lipid Profile: No results for input(s): CHOL, HDL, LDLCALC, TRIG, CHOLHDL, LDLDIRECT in the last 72 hours. Thyroid Function Tests: No results for input(s): TSH, T4TOTAL, FREET4, T3FREE, THYROIDAB in the last 72 hours. Anemia Panel: No results for input(s): VITAMINB12, FOLATE, FERRITIN, TIBC, IRON, RETICCTPCT in the last 72 hours. Sepsis Labs: Recent Labs  Lab 02/22/17 2030  LATICACIDVEN 1.29    Recent Results (from the past 240 hour(s))  Blood Culture (routine x 2)     Status: Abnormal (Preliminary result)   Collection Time: 02/22/17 10:56 PM  Result Value Ref Range Status   Specimen Description BLOOD LEFT ANTECUBITAL   Final   Special Requests   Final    BOTTLES DRAWN AEROBIC AND ANAEROBIC Blood Culture adequate volume   Culture  Setup Time   Final    GRAM POSITIVE COCCI IN CHAINS IN BOTH AEROBIC AND ANAEROBIC BOTTLES CRITICAL RESULT CALLED TO, READ BACK BY AND VERIFIED WITH: A. MASTERS PHARMD, AT 2350 02/23/17 BY Rush Landmark Performed at [facility], [place] [address] [place], [place] [address]    Culture STREPTOCOCCUS PYOGENES (A)  Final   Report Status PENDING  Incomplete  Blood Culture (routine x 2)     Status: Abnormal   Collection Time: 02/22/17 10:56 PM  Result Value Ref Range Status   Specimen Description BLOOD RIGHT WRIST  Final   Special Requests IN PEDIATRIC BOTTLE Blood Culture adequate volume  Final   Culture  Setup Time   Final    GRAM POSITIVE COCCI IN CHAINS IN PEDIATRIC BOTTLE CRITICAL RESULT CALLED TO, READ BACK BY AND VERIFIED WITH: A. MASTERS PHARMD, AT 2350 02/23/17 BY Rush Landmark Performed at [facility], [place] [address], [place], [name] [address]    Culture GROUP A STREP (S.PYOGENES) ISOLATED (A)  Final   Report Status 02/25/2017 FINAL  Final   Organism ID, Bacteria GROUP A STREP (S.PYOGENES) ISOLATED  Final      Susceptibility   Group a strep (s.pyogenes) isolated - MIC*    PENICILLIN <=0.06 SENSITIVE Sensitive     CEFTRIAXONE <=0.12 SENSITIVE Sensitive     ERYTHROMYCIN <=0.12 SENSITIVE Sensitive     LEVOFLOXACIN 0.5 SENSITIVE Sensitive     VANCOMYCIN 0.25 SENSITIVE Sensitive     * GROUP A STREP (S.PYOGENES) ISOLATED  Blood Culture ID Panel (Reflexed)     Status: Abnormal   Collection Time: 02/22/17 10:56 PM  Result Value Ref Range Status   Enterococcus species NOT DETECTED NOT DETECTED Final   Listeria monocytogenes NOT DETECTED NOT DETECTED Final   Staphylococcus species NOT DETECTED NOT DETECTED Final   Staphylococcus aureus NOT DETECTED NOT DETECTED Final   Streptococcus species DETECTED (A) NOT DETECTED Final    Comment: CRITICAL RESULT CALLED TO, READ  BACK BY AND VERIFIED WITH: A. MASTERS PHARMD, AT 2350 02/23/17 BY D. VANHOOK    Streptococcus agalactiae NOT DETECTED NOT DETECTED Final   Streptococcus pneumoniae NOT DETECTED NOT DETECTED Final   Streptococcus pyogenes DETECTED (A) NOT DETECTED Final    Comment: CRITICAL RESULT CALLED TO, READ BACK BY AND VERIFIED WITH: A. MASTERS PHARMD, AT 2350 02/23/17 BY D. VANHOOK    Acinetobacter baumannii NOT DETECTED NOT DETECTED Final   Enterobacteriaceae species NOT DETECTED NOT DETECTED Final   Enterobacter cloacae complex NOT DETECTED NOT DETECTED Final   Escherichia coli NOT DETECTED NOT DETECTED Final   Klebsiella oxytoca NOT DETECTED NOT DETECTED Final   Klebsiella pneumoniae NOT DETECTED NOT DETECTED Final   Proteus species NOT DETECTED NOT DETECTED Final   Serratia marcescens NOT DETECTED NOT DETECTED Final   Haemophilus influenzae NOT DETECTED NOT DETECTED Final   Neisseria meningitidis NOT DETECTED NOT DETECTED Final   Pseudomonas aeruginosa NOT DETECTED NOT DETECTED Final   Candida albicans NOT DETECTED NOT DETECTED Final   Candida glabrata NOT DETECTED NOT DETECTED Final   Candida krusei NOT DETECTED NOT DETECTED Final   Candida parapsilosis NOT DETECTED NOT DETECTED Final   Candida tropicalis NOT DETECTED NOT DETECTED Final    Comment: Performed at [facility], [place] [address], [place],  [address]  Body fluid culture     Status: None (Preliminary result)   Collection Time: 02/23/17  1:17 AM  Result Value Ref Range Status   Specimen Description SYNOVIAL Bursa  Final   Special Requests NONE  Final   Gram Stain   Final    ABUNDANT WBC PRESENT, PREDOMINANTLY PMN FEW GRAM POSITIVE COCCI IN CHAINS Gram Stain Report Called to,Read Back By and Verified With: COGGINS,E RN 1.8.19 @0225  ZANDOC    Culture MODERATE GROUP A STREP (S.PYOGENES) ISOLATED  Final   Report Status PENDING  Incomplete  Radiology Studies: No results found.    Scheduled Meds: Continuous  Infusions: . sodium chloride 50 mL/hr at 02/25/17 1055  . cefTRIAXone (ROCEPHIN)  IV Stopped (02/24/17 1516)     LOS: 2 days   Time spent: Total of 15 minutes spent with pt, greater than 50% of which was spent in discussion of  treatment, counseling and coordination of care  Chipper Oman, MD Pager: Text Page via [email or web address]   If 7PM-7AM, please contact night-coverage [email or web address] 02/25/2017, 2:29 PM

## 2017-02-25 NOTE — Progress Notes (Signed)
Subjective: 2 Days Post-Op Procedure(s) (LRB): IRRIGATION AND DEBRIDEMENT ABSCESS (Left) Patient reports pain as mild.  No complaints.   Objective: Vital signs in last 24 hours: Temp:  [97.7 F (36.5 C)-98.1 F (36.7 C)] 98.1 F (36.7 C) (01/10 1100) Pulse Rate:  [80-85] 82 (01/10 1100) Resp:  [18] 18 (01/10 0509) BP: (106-152)/(64-75) 137/64 (01/10 1100) SpO2:  [99 %-100 %] 99 % (01/10 0509)  Intake/Output from previous day: 01/09 0701 - 01/10 0700 In: 290 [P.O.:240; IV Piggyback:50] Out: -  Intake/Output this shift: Total I/O In: 120 [P.O.:120] Out: -   Recent Labs    02/22/17 2016 02/23/17 0523 02/23/17 1624 02/24/17 0515 02/25/17 0512  HGB 11.5* 10.9* 11.6* 9.1* 8.4*   Recent Labs    02/24/17 0515 02/25/17 0512  WBC 21.7* 22.2*  RBC 3.56* 3.32*  HCT 29.4* 27.2*  PLT 386 493*   Recent Labs    02/23/17 0523 02/23/17 1624 02/24/17 0515  NA 133* 137 135  K 2.7* 3.4* 4.1  CL 99*  --  101  CO2 26  --  25  BUN 19  --  17  CREATININE 1.04*  --  1.04*  GLUCOSE 150* 116* 200*  CALCIUM 7.8*  --  7.6*   No results for input(s): LABPT, INR in the last 72 hours.  Left upper extremity: Neurovascular intact Sensation intact distally Dressing clean dry and intact Gentle ROM left shoulder cause no pain  Assessment/Plan: 2 Days Post-Op Procedure(s) (LRB): IRRIGATION AND DEBRIDEMENT ABSCESS (Left) Continue PT/OT for range of motion left shoulder Will need follow up with Dr. Ninfa Linden in office 2 weeks post op  Erskine Emery 02/25/2017, 12:21 PM

## 2017-02-25 NOTE — [place] FL2 (Signed)
[place] MEDICAID FL2 LEVEL OF CARE SCREENING TOOL     IDENTIFICATION  Patient Name: LYNNIX SCHONEMAN Birthdate: [birth year] Sex: female Admission Date (Current Location): 02/22/2017  [facility] and [place] Number:  Herbalist and Address:  [facility], [place] [address], [place], [place] [address]      Provider Number: [id number]  Attending Physician Name and Address:  Doreatha Lew, MD  Relative Name and Phone Number:  Jeanella Flattery, [phone]    Current Level of Care: Hospital Recommended Level of Care: [place] Prior Approval Number:    Date Approved/Denied:   PASRR Number: [id number]  Discharge Plan: SNF    Current Diagnoses: Patient Active Problem List   Diagnosis Date Noted  . Streptococcal bacteremia 02/24/2017  . Abscess of left shoulder 02/24/2017  . Anxiety disorder 02/24/2017  . Recovering alcoholic in remission ([place]) 02/24/2017  . Septic bursitis 02/23/2017  . Insomnia 07/01/2015  . Genital herpes simplex type 2 11/03/2014  . Hyperglycemia 05/23/2012  . Multinodular goiter 05/23/2012  . Essential hypertension, benign 05/23/2012    Orientation RESPIRATION BLADDER Height & Weight     Self, Time, Place, Situation  Normal Continent Weight: 104 lb (47.2 kg) Height:  5\' 4"  (162.6 cm)  BEHAVIORAL SYMPTOMS/MOOD NEUROLOGICAL BOWEL NUTRITION STATUS      Continent Diet(See DC Summary)  AMBULATORY STATUS COMMUNICATION OF NEEDS Skin   Limited Assist Verbally Surgical wounds                       Personal Care Assistance Level of Assistance  Dressing, Bathing, Feeding Bathing Assistance: Limited assistance Feeding assistance: Independent Dressing Assistance: Limited assistance     Functional Limitations Info  Sight, Hearing, Speech Sight Info: Adequate Hearing Info: Adequate Speech Info: Adequate    SPECIAL CARE FACTORS FREQUENCY  PT (By licensed PT), OT (By licensed OT)     PT Frequency: 5x  week OT Frequency: 5x week            Contractures      Additional Factors Info  Code Status, Allergies Code Status Info: Full Allergies Info: No Known Allergies           Current Medications (02/25/2017):  This is the current hospital active medication list Current Facility-Administered Medications  Medication Dose Route Frequency Provider Last Rate Last Dose  . 0.9 %  sodium chloride infusion   Intravenous Continuous Patrecia Pour, Christean Grief, MD 50 mL/hr at 02/25/17 1055    . acetaminophen (TYLENOL) tablet 650 mg  650 mg Oral Q6H PRN Etta Quill, DO   650 mg at 02/25/17 1102   Or  . acetaminophen (TYLENOL) suppository 650 mg  650 mg Rectal Q6H PRN Etta Quill, DO      . cefTRIAXone (ROCEPHIN) 2 g in dextrose 5 % 50 mL IVPB  2 g Intravenous Q24H Michel Bickers, MD 100 mL/hr at 02/25/17 1500 2 g at 02/25/17 1500  . magic mouthwash w/lidocaine  15 mL Oral QID PRN Schorr, Rhetta Mura, NP   15 mL at 02/24/17 2255  . ondansetron (ZOFRAN) tablet 4 mg  4 mg Oral Q6H PRN Etta Quill, DO       Or  . ondansetron [facility]) injection 4 mg  4 mg Intravenous Q6H PRN Etta Quill, DO      . oxyCODONE (Oxy IR/ROXICODONE) immediate release tablet 5-10 mg  5-10 mg Oral Q3H PRN Mcarthur Rossetti, MD   5  mg at 02/23/17 1938     Discharge Medications: Please see discharge summary for a list of discharge medications.  Relevant Imaging Results:  Relevant Lab Results:   Additional Information SS#: [id number]  [name], LCSW

## 2017-02-25 NOTE — Progress Notes (Signed)
Patient ID: [name], female   DOB: 11-03-1935, 82 y.o.   MRN: [id number] Assessed patient at the bedside.  She does report left shoulder pain, but much less than pre-op.  Her left shoulder dressing is changed.  Her swelling is down significantly and there suture line looks good.  A new dressing is applied.  Cultures showing a strep species thus far.  Antibiotics per Infectious Disease.  Activity as tolerated with her left shoulder.

## 2017-02-25 NOTE — Progress Notes (Signed)
Patient ID: [name], female   DOB: [birth year], 82 y.o.   MRN: [id number]          [facility]  Date of Admission:  02/22/2017    Total days of antibiotics 3        Day 2 ceftriaxone         ASSESSMENT: She showed rapid improvement in her group A streptococcal bacteremia and left shoulder abscess following abscess drainage.  I plan on changing ceftriaxone to oral amoxicillin tomorrow to complete 14 days of total therapy.  PLAN: 1. Continue ceftriaxone for now  Active Problems:   Streptococcal bacteremia   Abscess of left shoulder   Essential hypertension, benign   Anxiety disorder   Recovering alcoholic in remission ([place])   Scheduled Meds: Continuous Infusions: . sodium chloride 50 mL/hr at 02/25/17 1055  . cefTRIAXone (ROCEPHIN)  IV Stopped (02/24/17 1516)   PRN Meds:.acetaminophen **OR** acetaminophen, magic mouthwash w/lidocaine, ondansetron **OR** ondansetron (ZOFRAN) IV, oxyCODONE   SUBJECTIVE: She is feeling better.  Her pain is markedly improved compared with how she felt upon admission.  Review of Systems: Review of Systems  Constitutional: Negative for chills, diaphoresis and fever.  HENT:       Her dry mouth is improving.  Gastrointestinal: Negative for abdominal pain, diarrhea, nausea and vomiting.  Musculoskeletal: Positive for joint pain.    No Known Allergies  OBJECTIVE: Vitals:   02/24/17 1524 02/24/17 2104 02/25/17 0509 02/25/17 1100  BP: 106/68 (!) 152/75 130/67 137/64  Pulse: 85 80 81 82  Resp: 18 18 18    Temp: 97.7 F (36.5 C) 98 F (36.7 C) 97.8 F (36.6 C) 98.1 F (36.7 C)  TempSrc: Oral Oral Oral Oral  SpO2: 100% 100% 99%   Weight:      Height:       Body mass index is 17.85 kg/m.  Physical Exam  Constitutional: She is oriented to person, place, and time.  She is in good spirits.  Cardiovascular: Normal rate and regular rhythm.  No murmur heard. Pulmonary/Chest: Effort normal and breath sounds  normal.  Abdominal: Soft. She exhibits no distension. There is no tenderness.  Musculoskeletal:  Left shoulder dressing is clean and dry.  She can put her left arm through range of motion without significant discomfort.  Neurological: She is alert and oriented to person, place, and time.  Skin: No rash noted.  Psychiatric: Mood and affect normal.    Lab Results Lab Results  Component Value Date   WBC 22.2 (H) 02/25/2017   HGB 8.4 (L) 02/25/2017   HCT 27.2 (L) 02/25/2017   MCV 81.9 02/25/2017   PLT 493 (H) 02/25/2017    Lab Results  Component Value Date   CREATININE 1.04 (H) 02/24/2017   BUN 17 02/24/2017   NA 135 02/24/2017   K 4.1 02/24/2017   CL 101 02/24/2017   CO2 25 02/24/2017    Lab Results  Component Value Date   ALT 21 02/22/2017   AST 25 02/22/2017   ALKPHOS 138 (H) 02/22/2017   BILITOT 0.9 02/22/2017     Microbiology: Recent Results (from the past 240 hour(s))  Blood Culture (routine x 2)     Status: Abnormal (Preliminary result)   Collection Time: 02/22/17 10:56 PM  Result Value Ref Range Status   Specimen Description BLOOD LEFT ANTECUBITAL  Final   Special Requests   Final    BOTTLES DRAWN AEROBIC AND ANAEROBIC Blood Culture adequate volume   Culture  Setup Time   Final    GRAM POSITIVE COCCI IN CHAINS IN BOTH AEROBIC AND ANAEROBIC BOTTLES CRITICAL RESULT CALLED TO, READ BACK BY AND VERIFIED WITH: A. MASTERS PHARMD, AT 2350 02/23/17 BY Rush Landmark Performed at [facility], [place] [address] [place], [place] [address]    Culture STREPTOCOCCUS PYOGENES (A)  Final   Report Status PENDING  Incomplete  Blood Culture (routine x 2)     Status: Abnormal (Preliminary result)   Collection Time: 02/22/17 10:56 PM  Result Value Ref Range Status   Specimen Description BLOOD RIGHT WRIST  Final   Special Requests IN PEDIATRIC BOTTLE Blood Culture adequate volume  Final   Culture  Setup Time   Final    GRAM POSITIVE COCCI IN CHAINS IN PEDIATRIC BOTTLE CRITICAL  RESULT CALLED TO, READ BACK BY AND VERIFIED WITH: A. MASTERS PHARMD, AT 2350 02/23/17 BY D. VANHOOK    Culture (A)  Final    GROUP A STREP (S.PYOGENES) ISOLATED SUSCEPTIBILITIES TO FOLLOW Performed at [facility], [place] [address] [place], [place] [address]    Report Status PENDING  Incomplete  Blood Culture ID Panel (Reflexed)     Status: Abnormal   Collection Time: 02/22/17 10:56 PM  Result Value Ref Range Status   Enterococcus species NOT DETECTED NOT DETECTED Final   Listeria monocytogenes NOT DETECTED NOT DETECTED Final   Staphylococcus species NOT DETECTED NOT DETECTED Final   Staphylococcus aureus NOT DETECTED NOT DETECTED Final   Streptococcus species DETECTED (A) NOT DETECTED Final    Comment: CRITICAL RESULT CALLED TO, READ BACK BY AND VERIFIED WITH: A. MASTERS PHARMD, AT 2350 02/23/17 BY D. VANHOOK    Streptococcus agalactiae NOT DETECTED NOT DETECTED Final   Streptococcus pneumoniae NOT DETECTED NOT DETECTED Final   Streptococcus pyogenes DETECTED (A) NOT DETECTED Final    Comment: CRITICAL RESULT CALLED TO, READ BACK BY AND VERIFIED WITH: A. MASTERS PHARMD, AT 2350 02/23/17 BY D. VANHOOK    Acinetobacter baumannii NOT DETECTED NOT DETECTED Final   Enterobacteriaceae species NOT DETECTED NOT DETECTED Final   Enterobacter cloacae complex NOT DETECTED NOT DETECTED Final   Escherichia coli NOT DETECTED NOT DETECTED Final   Klebsiella oxytoca NOT DETECTED NOT DETECTED Final   Klebsiella pneumoniae NOT DETECTED NOT DETECTED Final   Proteus species NOT DETECTED NOT DETECTED Final   Serratia marcescens NOT DETECTED NOT DETECTED Final   Haemophilus influenzae NOT DETECTED NOT DETECTED Final   Neisseria meningitidis NOT DETECTED NOT DETECTED Final   Pseudomonas aeruginosa NOT DETECTED NOT DETECTED Final   Candida albicans NOT DETECTED NOT DETECTED Final   Candida glabrata NOT DETECTED NOT DETECTED Final   Candida krusei NOT DETECTED NOT DETECTED Final   Candida parapsilosis  NOT DETECTED NOT DETECTED Final   Candida tropicalis NOT DETECTED NOT DETECTED Final    Comment: Performed at [facility], [place] [address], [place], [place] [address]  Body fluid culture     Status: None (Preliminary result)   Collection Time: 02/23/17  1:17 AM  Result Value Ref Range Status   Specimen Description SYNOVIAL Bursa  Final   Special Requests NONE  Final   Gram Stain   Final    ABUNDANT WBC PRESENT, PREDOMINANTLY PMN FEW GRAM POSITIVE COCCI IN CHAINS Gram Stain Report Called to,Read Back By and Verified With: COGGINS,E RN 1.8.19 @0225  [place]    Culture MODERATE GROUP A STREP (S.PYOGENES) ISOLATED  Final   Report Status PENDING  Incomplete  Michel Bickers, MD [facility] [phone] pager   [phone] cell 02/25/2017, 12:51 PM

## 2017-02-25 NOTE — Progress Notes (Signed)
Nurse was called to the room- patient is concern regarding swelling in the left upper arm and elbow.  Soft to touch, a bruising type area noted above the elbow.  Patient was offered a pillow for comfort and an ice pack but does not want the ice pack at this time.

## 2017-02-25 NOTE — Discharge Instructions (Signed)
Can get current left shoulder dressing wet in the shower. Can get actual incision wet in the shower starting 03/01/16. New dry dressing as needed. Can use left shoulder as comfort allows.

## 2017-02-26 DIAGNOSIS — H3530 Unspecified macular degeneration: Secondary | ICD-10-CM | POA: Diagnosis not present

## 2017-02-26 DIAGNOSIS — M71012 Abscess of bursa, left shoulder: Secondary | ICD-10-CM | POA: Diagnosis not present

## 2017-02-26 DIAGNOSIS — R350 Frequency of micturition: Secondary | ICD-10-CM | POA: Diagnosis not present

## 2017-02-26 DIAGNOSIS — R488 Other symbolic dysfunctions: Secondary | ICD-10-CM | POA: Diagnosis not present

## 2017-02-26 DIAGNOSIS — M419 Scoliosis, unspecified: Secondary | ICD-10-CM | POA: Diagnosis not present

## 2017-02-26 DIAGNOSIS — M6281 Muscle weakness (generalized): Secondary | ICD-10-CM | POA: Diagnosis not present

## 2017-02-26 DIAGNOSIS — B950 Streptococcus, group A, as the cause of diseases classified elsewhere: Secondary | ICD-10-CM | POA: Diagnosis not present

## 2017-02-26 DIAGNOSIS — E7251 Non-ketotic hyperglycinemia: Secondary | ICD-10-CM | POA: Diagnosis not present

## 2017-02-26 DIAGNOSIS — E785 Hyperlipidemia, unspecified: Secondary | ICD-10-CM | POA: Diagnosis not present

## 2017-02-26 DIAGNOSIS — B999 Unspecified infectious disease: Secondary | ICD-10-CM | POA: Diagnosis not present

## 2017-02-26 DIAGNOSIS — L02414 Cutaneous abscess of left upper limb: Secondary | ICD-10-CM | POA: Diagnosis not present

## 2017-02-26 DIAGNOSIS — L0291 Cutaneous abscess, unspecified: Secondary | ICD-10-CM | POA: Diagnosis not present

## 2017-02-26 DIAGNOSIS — R7881 Bacteremia: Secondary | ICD-10-CM | POA: Diagnosis not present

## 2017-02-26 DIAGNOSIS — Z48817 Encounter for surgical aftercare following surgery on the skin and subcutaneous tissue: Secondary | ICD-10-CM | POA: Diagnosis not present

## 2017-02-26 DIAGNOSIS — M7110 Other infective bursitis, unspecified site: Secondary | ICD-10-CM | POA: Diagnosis not present

## 2017-02-26 DIAGNOSIS — I10 Essential (primary) hypertension: Secondary | ICD-10-CM | POA: Diagnosis not present

## 2017-02-26 DIAGNOSIS — R739 Hyperglycemia, unspecified: Secondary | ICD-10-CM | POA: Diagnosis not present

## 2017-02-26 LAB — BODY FLUID CULTURE

## 2017-02-26 LAB — CBC
HCT: 26.6 % — ABNORMAL LOW (ref 36.0–46.0)
Hemoglobin: 8.5 g/dL — ABNORMAL LOW (ref 12.0–15.0)
MCH: 26.3 pg (ref 26.0–34.0)
MCHC: 32 g/dL (ref 30.0–36.0)
MCV: 82.4 fL (ref 78.0–100.0)
PLATELETS: 508 10*3/uL — AB (ref 150–400)
RBC: 3.23 MIL/uL — AB (ref 3.87–5.11)
RDW: 15.7 % — AB (ref 11.5–15.5)
WBC: 16.3 10*3/uL — ABNORMAL HIGH (ref 4.0–10.5)

## 2017-02-26 LAB — CULTURE, BLOOD (ROUTINE X 2): Special Requests: ADEQUATE

## 2017-02-26 LAB — BASIC METABOLIC PANEL
Anion gap: 8 (ref 5–15)
BUN: 22 mg/dL — AB (ref 6–20)
CALCIUM: 7.6 mg/dL — AB (ref 8.9–10.3)
CO2: 25 mmol/L (ref 22–32)
CREATININE: 0.95 mg/dL (ref 0.44–1.00)
Chloride: 103 mmol/L (ref 101–111)
GFR calc non Af Amer: 55 mL/min — ABNORMAL LOW (ref >60)
Glucose, Bld: 94 mg/dL (ref 65–99)
Potassium: 4.4 mmol/L (ref 3.5–5.1)
SODIUM: 136 mmol/L (ref 135–145)

## 2017-02-26 MED ORDER — OXYCODONE HCL 5 MG PO TABS: 5.0000 mg | ORAL_TABLET | ORAL | 0 refills | Status: DC | PRN

## 2017-02-26 MED ORDER — AMOXICILLIN 500 MG PO TABS: 500.0000 mg | ORAL_TABLET | Freq: Three times a day (TID) | ORAL | 0 refills | Status: DC

## 2017-02-26 MED ORDER — AMOXICILLIN 500 MG PO TABS: 500.0000 mg | ORAL_TABLET | Freq: Two times a day (BID) | ORAL | 0 refills | Status: DC

## 2017-02-26 MED ORDER — AMOXICILLIN 500 MG PO CAPS
500.0000 mg | ORAL_CAPSULE | Freq: Three times a day (TID) | ORAL | Status: DC
  Filled 2017-02-26: qty 1

## 2017-02-26 NOTE — Clinical Social Work Placement (Signed)
   CLINICAL SOCIAL WORK PLACEMENT  NOTE  Date:  02/26/2017  Patient Details  Name: [name] MRN: [id number] Date of Birth: 04/08/1935  Clinical Social Work is seeking post-discharge placement for this patient at the [place] level of care (*CSW will initial, date and re-position this form in  chart as items are completed):  Yes   Patient/family provided with [place] Work Department's list of facilities offering this level of care within the geographic area requested by the patient (or if unable, by the patient's family).  Yes   Patient/family informed of their freedom to choose among providers that offer the needed level of care, that participate in Medicare, Medicaid or managed care program needed by the patient, have an available bed and are willing to accept the patient.  Yes   Patient/family informed of [facility]'s ownership interest in [facility] and [facility], as well as of the fact that they are under no obligation to receive care at these facilities.  PASRR submitted to EDS on       PASRR number received on 02/25/17     Existing PASRR number confirmed on       FL2 transmitted to all facilities in geographic area requested by pt/family on 02/25/17     FL2 transmitted to all facilities within larger geographic area on       Patient informed that his/her managed care company has contracts with or will negotiate with certain facilities, including the following:        Yes   Patient/family informed of bed offers received.  Patient chooses bed at [facility] recommends and patient chooses bed at      Patient to be transferred to [facility] on 02/26/17.  Patient to be transferred to facility by [facility]     Patient family notified on 02/26/17 of transfer.  Name of family member notified:  pt responsible for self     PHYSICIAN       Additional Comment:     _______________________________________________ [name], LCSW 02/26/2017, 12:10 PM

## 2017-02-26 NOTE — Clinical Social Work Note (Signed)
Clinical Social Work Assessment  Patient Details  Name: [name] MRN: [id number] Date of Birth: Oct 21, 1935  Date of referral:  02/26/17               Reason for consult:  Facility Placement                Permission sought to share information with:  Case Manager Permission granted to share information::  Yes, Verbal Permission Granted  Name::     Tax adviser::  SNF  Relationship::  friend  Contact Information:     Housing/Transportation Living arrangements for the past 2 months:  Single Family Home Source of Information:  Patient, Friend/Neighbor Patient Interpreter Needed:  None Criminal Activity/Legal Involvement Pertinent to Current Situation/Hospitalization:  No - Comment as needed Significant Relationships:  Friend Lives with:  Self Do you feel safe going back to the place where you live?  No Need for family participation in patient care:  No (Coment)  Care giving concerns:  CSW met with patient and friend at bedside. Pt resides alone and given shoulder impairment will struggle to manage ADL's at home.  Pt agrees with recommendation from clinical team. CSW discussed SNF options/placement. CSW discussed insurance process.  Social Worker assessment / plan:  CSW will f/u on disposition.  Employment status:  Retired Nurse, adult PT Recommendations:  [place] / Referral to community resources:  [place]  Patient/Family's Response to care:  Patient appreciative of [place] meeting to discuss disposition. No other issues or concerns.  Patient/Family's Understanding of and Emotional Response to Diagnosis, Current Treatment, and Prognosis:  Patient understands that she has no one at home to assist with new impairment. Pt willing to get short term rehab to build her strength. CSW discussed plan after discharge from SNF and will go to friends house for additional support then return home. No issues or  concern.  Emotional Assessment Appearance:  Appears stated age Attitude/Demeanor/Rapport:  (Cooperative) Affect (typically observed):  Accepting, Appropriate Orientation:  Oriented to Situation, Oriented to  Time, Oriented to Place, Oriented to Self Alcohol / Substance use:  Not Applicable Psych involvement (Current and /or in the community):  Outpatient Provider  Discharge Needs  Concerns to be addressed:  Discharge Planning Concerns Readmission within the last 30 days:  No Current discharge risk:  Physical Impairment, Dependent with Mobility, Lives alone Barriers to Discharge:  No Barriers Identified   Kerri Baxter, LCSW 02/26/2017, 9:44 AM

## 2017-02-26 NOTE — Progress Notes (Signed)
Occupational Therapy Treatment Patient Details Name: [name] MRN: [id number] DOB: [birth year] Today's Date: 02/26/2017    History of present illness Pt is a 82 y/o female who presents with a L septic shoulder. She is now s/p I&D of L shoulder on 02/23/17.    OT comments  Pt presenting sitting up in recliner this session, reporting having already completed morning ADLs though agreeable to review of LUE HEP/ROM. Pt completing self-ROM/AROM with verbal cues throughout for proper movements and accuracy. Education provided on edema reduction techniques with Pt verbalizing understanding. Pt reports plans for d/c to SNF today due to decreased caregiver support after discharge. Will continue to follow acutely to continue progressing Pt towards PLOF.    Follow Up Recommendations  SNF    Equipment Recommendations  None recommended by OT          Precautions / Restrictions Precautions Precautions: Fall Restrictions Weight Bearing Restrictions: No       Mobility Bed Mobility               General bed mobility comments: OOB in recliner upon arrival                                                                    ADL either performed or assessed with clinical judgement   ADL                                         General ADL Comments: Pt reports having completing AM ADLs, agreeable to LUE HEP/ROM; education provided on edema reduction techniques                        Cognition Arousal/Alertness: Awake/alert Behavior During Therapy: WFL for tasks assessed/performed Overall Cognitive Status: Within Functional Limits for tasks assessed                                          Exercises Shoulder Exercises Shoulder Flexion: Self ROM;Left;Seated;10 reps Shoulder ABduction: Self ROM;Left;10 reps;Seated Shoulder External Rotation: Self ROM;10 reps;Left;Seated(to neutral) Elbow Flexion: AROM;10  reps;Left;Seated Wrist Flexion: AROM;10 reps;Left;Seated Wrist Extension: AROM;10 reps;Left;Seated Digit Composite Flexion: AROM;10 reps;Left;Seated Composite Extension: AROM;10 reps;Left;Seated Hand Exercises Forearm Supination: AROM;10 reps;Left;Seated Forearm Pronation: AROM;10 reps;Left;Seated                Pertinent Vitals/ Pain       Pain Assessment: 0-10 Pain Score: 3  Pain Location: LUE Pain Descriptors / Indicators: Sore Pain Intervention(s): Limited activity within patient's tolerance;Monitored during session;Repositioned                                                          Frequency  Min 2X/week        Progress Toward Goals  OT Goals(current goals can now be found in the care plan section)  Progress towards OT goals:  Progressing toward goals  Acute Rehab OT Goals Patient Stated Goal: Return home after rehab OT Goal Formulation: With patient/family Time For Goal Achievement: 03/10/17 Potential to Achieve Goals: Good  Plan Discharge plan needs to be updated                     AM-PAC PT "6 Clicks" Daily Activity     Outcome Measure   Help from another person eating meals?: None Help from another person taking care of personal grooming?: A Little Help from another person toileting, which includes using toliet, bedpan, or urinal?: A Little Help from another person bathing (including washing, rinsing, drying)?: A Little Help from another person to put on and taking off regular upper body clothing?: A Little Help from another person to put on and taking off regular lower body clothing?: A Little 6 Click Score: 19    End of Session    OT Visit Diagnosis: Unsteadiness on feet (R26.81)   Activity Tolerance Patient tolerated treatment well   Patient Left in chair;with call bell/phone within reach   Nurse Communication Mobility status        Time: 9892-1194 OT Time Calculation (min): 15 min  Charges: OT  General Charges $OT Visit: 1 Visit OT Treatments $Therapeutic Activity: 8-22 mins  [name], OT Pager [phone] 02/26/2017    [name] 02/26/2017, 12:18 PM

## 2017-02-26 NOTE — Progress Notes (Signed)
Report given to [place]

## 2017-02-26 NOTE — Progress Notes (Signed)
Patient ID: [name], female   DOB: 1935/02/27, 82 y.o.   MRN: [id number]         [facility]  Date of Admission:  02/22/2017    Total days of antibiotics 4        Day 3 ceftriaxone         ASSESSMENT: She showed rapid improvement in her group A streptococcal bacteremia and left shoulder abscess following abscess drainage.  I will change her ceftriaxone to oral amoxicillin now for 10 more days of therapy.  PLAN: 1. Change ceftriaxone to oral amoxicillin and treat through 03/08/2017 2. I will sign off now  Active Problems:   Streptococcal bacteremia   Abscess of left shoulder   Essential hypertension, benign   Anxiety disorder   Recovering alcoholic in remission ([place])   Scheduled Meds: Continuous Infusions: . sodium chloride 50 mL/hr at 02/25/17 1055  . cefTRIAXone (ROCEPHIN)  IV Stopped (02/25/17 1530)   PRN Meds:.acetaminophen **OR** acetaminophen, magic mouthwash w/lidocaine, ondansetron **OR** ondansetron (ZOFRAN) IV, oxyCODONE   SUBJECTIVE: She is feeling better with resolving left shoulder pain.  She walked in the hallway today with the physical therapist.  Review of Systems: Review of Systems  Constitutional: Negative for chills, diaphoresis and fever.  HENT:       Her dry mouth is improving.  Gastrointestinal: Negative for abdominal pain, diarrhea, nausea and vomiting.  Musculoskeletal: Positive for joint pain.    No Known Allergies  OBJECTIVE: Vitals:   02/25/17 1100 02/25/17 1500 02/25/17 2014 02/26/17 0505  BP: 137/64 125/62 (!) 143/77 (!) 149/88  Pulse: 82 86 76 79  Resp:  16 16 16   Temp: 98.1 F (36.7 C) 97.8 F (36.6 C) 98.2 F (36.8 C) 98.5 F (36.9 C)  TempSrc: Oral Oral Oral Oral  SpO2:  100% 98% 97%  Weight:      Height:       Body mass index is 17.85 kg/m.  Physical Exam  Constitutional: She is oriented to person, place, and time.  She is in good spirits.  Cardiovascular: Normal rate and regular rhythm.    No murmur heard. Pulmonary/Chest: Effort normal and breath sounds normal.  Abdominal: Soft. She exhibits no distension. There is no tenderness.  Musculoskeletal:  Left shoulder dressing is clean and dry.    Neurological: She is alert and oriented to person, place, and time.  Skin: No rash noted.  Psychiatric: Mood and affect normal.    Lab Results Lab Results  Component Value Date   WBC 16.3 (H) 02/26/2017   HGB 8.5 (L) 02/26/2017   HCT 26.6 (L) 02/26/2017   MCV 82.4 02/26/2017   PLT 508 (H) 02/26/2017    Lab Results  Component Value Date   CREATININE 0.95 02/26/2017   BUN 22 (H) 02/26/2017   NA 136 02/26/2017   K 4.4 02/26/2017   CL 103 02/26/2017   CO2 25 02/26/2017    Lab Results  Component Value Date   ALT 21 02/22/2017   AST 25 02/22/2017   ALKPHOS 138 (H) 02/22/2017   BILITOT 0.9 02/22/2017     Microbiology: Recent Results (from the past 240 hour(s))  Blood Culture (routine x 2)     Status: Abnormal   Collection Time: 02/22/17 10:56 PM  Result Value Ref Range Status   Specimen Description BLOOD LEFT ANTECUBITAL  Final   Special Requests   Final    BOTTLES DRAWN AEROBIC AND ANAEROBIC Blood Culture adequate volume   Culture  Setup Time   Final    GRAM POSITIVE COCCI IN CHAINS IN BOTH AEROBIC AND ANAEROBIC BOTTLES CRITICAL RESULT CALLED TO, READ BACK BY AND VERIFIED WITH: A. MASTERS PHARMD, AT 2350 02/23/17 BY D. VANHOOK    Culture (A)  Final    GROUP A STREP (S.PYOGENES) ISOLATED SUSCEPTIBILITIES PERFORMED ON PREVIOUS CULTURE WITHIN THE LAST 5 DAYS. Performed at [facility], [place] [address], [place], [place] [address]    Report Status 02/26/2017 FINAL  Final  Blood Culture (routine x 2)     Status: Abnormal   Collection Time: 02/22/17 10:56 PM  Result Value Ref Range Status   Specimen Description BLOOD RIGHT WRIST  Final   Special Requests IN PEDIATRIC BOTTLE Blood Culture adequate volume  Final   Culture  Setup Time   Final    GRAM POSITIVE  COCCI IN CHAINS IN PEDIATRIC BOTTLE CRITICAL RESULT CALLED TO, READ BACK BY AND VERIFIED WITH: A. MASTERS PHARMD, AT 2350 02/23/17 BY Rush Landmark Performed at [facility], [place] [address], [place], [name] [address]    Culture GROUP A STREP (S.PYOGENES) ISOLATED (A)  Final   Report Status 02/25/2017 FINAL  Final   Organism ID, Bacteria GROUP A STREP (S.PYOGENES) ISOLATED  Final      Susceptibility   Group a strep (s.pyogenes) isolated - MIC*    PENICILLIN <=0.06 SENSITIVE Sensitive     CEFTRIAXONE <=0.12 SENSITIVE Sensitive     ERYTHROMYCIN <=0.12 SENSITIVE Sensitive     LEVOFLOXACIN 0.5 SENSITIVE Sensitive     VANCOMYCIN 0.25 SENSITIVE Sensitive     * GROUP A STREP (S.PYOGENES) ISOLATED  Blood Culture ID Panel (Reflexed)     Status: Abnormal   Collection Time: 02/22/17 10:56 PM  Result Value Ref Range Status   Enterococcus species NOT DETECTED NOT DETECTED Final   Listeria monocytogenes NOT DETECTED NOT DETECTED Final   Staphylococcus species NOT DETECTED NOT DETECTED Final   Staphylococcus aureus NOT DETECTED NOT DETECTED Final   Streptococcus species DETECTED (A) NOT DETECTED Final    Comment: CRITICAL RESULT CALLED TO, READ BACK BY AND VERIFIED WITH: A. MASTERS PHARMD, AT 2350 02/23/17 BY D. VANHOOK    Streptococcus agalactiae NOT DETECTED NOT DETECTED Final   Streptococcus pneumoniae NOT DETECTED NOT DETECTED Final   Streptococcus pyogenes DETECTED (A) NOT DETECTED Final    Comment: CRITICAL RESULT CALLED TO, READ BACK BY AND VERIFIED WITH: A. MASTERS PHARMD, AT 2350 02/23/17 BY D. VANHOOK    Acinetobacter baumannii NOT DETECTED NOT DETECTED Final   Enterobacteriaceae species NOT DETECTED NOT DETECTED Final   Enterobacter cloacae complex NOT DETECTED NOT DETECTED Final   Escherichia coli NOT DETECTED NOT DETECTED Final   Klebsiella oxytoca NOT DETECTED NOT DETECTED Final   Klebsiella pneumoniae NOT DETECTED NOT DETECTED Final   Proteus species NOT DETECTED NOT DETECTED  Final   Serratia marcescens NOT DETECTED NOT DETECTED Final   Haemophilus influenzae NOT DETECTED NOT DETECTED Final   Neisseria meningitidis NOT DETECTED NOT DETECTED Final   Pseudomonas aeruginosa NOT DETECTED NOT DETECTED Final   Candida albicans NOT DETECTED NOT DETECTED Final   Candida glabrata NOT DETECTED NOT DETECTED Final   Candida krusei NOT DETECTED NOT DETECTED Final   Candida parapsilosis NOT DETECTED NOT DETECTED Final   Candida tropicalis NOT DETECTED NOT DETECTED Final    Comment: Performed at [facility], [place] [address], [place], [place] [address]  Body fluid culture     Status: None  Collection Time: 02/23/17  1:17 AM  Result Value Ref Range Status   Specimen Description SYNOVIAL [place]  Final   Special Requests NONE  Final   Gram Stain   Final    ABUNDANT WBC PRESENT, PREDOMINANTLY PMN FEW GRAM POSITIVE COCCI IN CHAINS Gram Stain Report Called to,Read Back By and Verified With: COGGINS,E RN 1.8.19 @0225  ZANDOC    Culture MODERATE GROUP A STREP (S.PYOGENES) ISOLATED  Final   Report Status 02/26/2017 FINAL  Final    Michel Bickers, MD [facility] [phone] pager   [phone] cell 02/26/2017, 11:55 AM

## 2017-02-26 NOTE — Discharge Summary (Addendum)
Physician Discharge Summary  [name]  AST:[id number]  DOB: November 25, 1935  DOA: 02/22/2017 PCP: Gayland Curry, DO  Admit date: 02/22/2017 Discharge date: 02/26/2017  Admitted From: Home  Disposition:  SNF   Recommendations for Outpatient Follow-up:  1. Follow up with SNF provider at earliest convenience  2. Please obtain BMP/CBC in one week to monitor Hgb and Cr  3. Oral antibiotics to complete 14 days. End date 03/08/17 4. Follow up with ortho in 2 weeks  5. Please obtain A1C in 1-2 weeks to confirm new diagnosis of DM   Discharge Condition: Stable  CODE STATUS: Full code  Diet recommendation: Heart Healthy   Brief/Interim Summary: For full details see H&P/Progress note but in brief, [name] is a 82 year old female with medical history significant, dyslipidemia hypertension and hyperglycemia. Presented to the emergency department for ongoing shoulder pain patient was treated with prednisone taper for 5 days which was not helping.  Subsequently she developed fever of 102.4.  Upon ED evaluation white count was elevated, CT of the shoulder show a fluid collection with bursitis patient was admitted for septic joint. Orthopedic surgery was consulted she underwent on I&D of the shoulder and blood and wound cultures showed group A strep infection. Patient was treated with Rocephin and transitioned to oral amox to complete 14 day of abx. Patient has clincally improve, she was evaluated by PT whom recommended SNF for STR. Patient deemed stable for discharge.   Subjective: Patient seen and examined, she continues to improve, ROM much better. Remains afebrile, wbc trending down. No other concerns.   Discharge Diagnoses/Hospital Course:  Septic left shoulder with abscess Status post I&D by orthopedic surgery Wound culture showing Streptococcus Initially treated with IV penicillin, then switched to rocephin, will d/c on Amox to complete 14 days  ID was consulted    Group A Streptococcal  bacteremia Due to left shoulder abscess ID was consulted - did not recommend to repeat cultures or get ECHO  Abx per above   Hypertension BP was stable during hospital stay  Resume home medications    Hyperglycemia No prior diagnosis of diabetes mellitus A1C 6.6 - CBG's stable - no need to start medications at this time Need to repeat A1C to confirm dx of DM as outpatient  Counseled about diet   All other chronic medical condition were stable during the hospitalization.  Patient was seen by physical therapy, recommending STR @ SNF  On the day of the discharge the patient's vitals were stable, and no other acute medical condition were reported by patient. the patient was felt safe to be discharge to SNF   Discharge Instructions  You were cared for by a hospitalist during your hospital stay. If you have any questions about your discharge medications or the care you received while you were in the hospital after you are discharged, you can call the unit and asked to speak with the hospitalist on call if the hospitalist that took care of you is not available. Once you are discharged, your primary care physician will handle any further medical issues. Please note that NO REFILLS for any discharge medications will be authorized once you are discharged, as it is imperative that you return to your primary care physician (or establish a relationship with a primary care physician if you do not have one) for your aftercare needs so that they can reassess your need for medications and monitor your lab values.  Discharge Instructions    Call MD for:  difficulty breathing, headache or visual disturbances   Complete by:  As directed    Call MD for:  extreme fatigue   Complete by:  As directed    Call MD for:  hives   Complete by:  As directed    Call MD for:  persistant dizziness or light-headedness   Complete by:  As directed    Call MD for:  persistant nausea and vomiting   Complete by:  As  directed    Call MD for:  redness, tenderness, or signs of infection (pain, swelling, redness, odor or green/yellow discharge around incision site)   Complete by:  As directed    Call MD for:  severe uncontrolled pain   Complete by:  As directed    Call MD for:  temperature >100.4   Complete by:  As directed    Diet - low sodium heart healthy   Complete by:  As directed    Increase activity slowly   Complete by:  As directed      Allergies as of 02/26/2017   No Known Allergies     Medication List    STOP taking these medications   ALPRAZolam 0.25 MG tablet Commonly known as:  XANAX     TAKE these medications   acetaminophen 500 MG tablet Commonly known as:  TYLENOL Take 1,000 mg by mouth every 6 (six) hours as needed for mild pain.   amoxicillin 500 MG tablet Commonly known as:  AMOXIL Take 1 tablet (500 mg total) by mouth 2 (two) times daily for 11 days.   BYSTOLIC 10 MG tablet Generic drug:  nebivolol TAKE 1/2 TABLET BY MOUTH ONCE DAILY   EXFORGE 10-320 MG tablet Generic drug:  amLODipine-valsartan take 1 tablet by mouth once daily   oxyCODONE 5 MG immediate release tablet Commonly known as:  Oxy IR/ROXICODONE Take 1 tablet (5 mg total) by mouth every 4 (four) hours as needed for severe pain.      Follow-up Information    Mcarthur Rossetti, MD. Schedule an appointment as soon as possible for a visit in 2 week(s).   Specialty:  Orthopedic Surgery Contact information: [place] [address] [phone]          No Known Allergies  Consultations:  ID   Orthopedic surgery    Procedures/Studies: Ct Shoulder Left Wo Contrast  Result Date: 02/23/2017 CLINICAL DATA:  82 year old female with left shoulder pain. No known injury. EXAM: CT OF THE UPPER LEFT EXTREMITY WITHOUT CONTRAST TECHNIQUE: Multidetector CT imaging of the upper left extremity was performed according to the standard protocol. COMPARISON:  Radiographs of the  left shoulder from earlier on the same day. FINDINGS: Bones/Joint/Cartilage No acute fracture is identified of the left shoulder. Slight dorsal subluxation of the humeral head relative to the glenoid likely from mass effect from adjacent presumed large periarticular/ bursal fluid collection. No suspicious osseous lesion is seen. Ligaments Suboptimally assessed by CT. Muscles and Tendons Overlying the subscapularis muscle is a masslike hypodensity suspicious for tracking of what may represent a large subacromial and subdeltoid bursal fluid collection. This measures approximately 7.7 x 3.9 x 6.6 cm on series 7, image 35 and coronal image 23. This finding may represent stigmata of a large bursitis. Intramuscular mass cannot be entirely excluded given limitations of a noncontrast study. Crescentic subtle hypodensity deep to the deltoid muscle is also noted on the coronal oblique and sagittal views causing soft tissue prominence and swelling. There is also likely reflects  stigmata of a large sub deltoid and subacromial bursal fluid collection. Non emergent MRI without and with contrast would help further correlation and to exclude intrinsic masses or adenopathy involving or adjacent to the subscapularis. IMPRESSION: 1. Lack of intravenous contrast limits assessment. Subtle hypodensity overlies the subscapularis tendon believed to represent continuation of a large subacromial and subdeltoid bursal fluid collection likely from enlarged bursitis. This measures approximately 7.7 x 3.9 x 6.6 cm. Alternatively, subacute intramuscular hematoma without active hemorrhage might have a similar appearance appear An eccentric intramuscular mass of the subscapularis, or potentially adjacent adenopathy is not entirely excluded but believed less likely. Subtle low attenuation favors fluid. Findings may represent stigmata of a large subacromial and subdeltoid bursitis. Suggest non emergent MRI for better assessment however. 2. Soft  tissue thickening likely related to tracking of fluid within the subdeltoid bursa as well. 3. No acute osseous abnormality of the left shoulder. No suspicious osseous lesions. Electronically Signed   By: Ashley Royalty M.D.   On: 02/23/2017 00:16   Dg Shoulder Left  Result Date: 02/22/2017 CLINICAL DATA:  Progressive left shoulder pain for 1 week. Fever. Possible septic joint. EXAM: LEFT SHOULDER - 2+ VIEW COMPARISON:  None. FINDINGS: The bones appear mildly demineralized. No evidence of acute fracture, dislocation or bone destruction. Mild inferior subluxation of the humeral head relative to the glenoid may indicate the presence of a joint effusion. There is possible soft tissue swelling around the shoulder. Aortic atherosclerosis noted. Mild midthoracic compression deformity, age indeterminate. IMPRESSION: Suspected joint effusion and periarticular soft tissue swelling; joint aspiration recommended if clinical concern of septic joint. No acute osseous findings or signs of osteomyelitis. Electronically Signed   By: Richardean Sale M.D.   On: 02/22/2017 18:16    Discharge Exam: Vitals:   02/25/17 2014 02/26/17 0505  BP: (!) 143/77 (!) 149/88  Pulse: 76 79  Resp: 16 16  Temp: 98.2 F (36.8 C) 98.5 F (36.9 C)  SpO2: 98% 97%   Vitals:   02/25/17 1100 02/25/17 1500 02/25/17 2014 02/26/17 0505  BP: 137/64 125/62 (!) 143/77 (!) 149/88  Pulse: 82 86 76 79  Resp:  16 16 16   Temp: 98.1 F (36.7 C) 97.8 F (36.6 C) 98.2 F (36.8 C) 98.5 F (36.9 C)  TempSrc: Oral Oral Oral Oral  SpO2:  100% 98% 97%  Weight:      Height:        General: NAD Cardiovascular: RRR, S1/S2 +, no rubs, no gallops Respiratory: CTA bilaterally, no wheezing, no rhonchi Abdominal: Soft, NT, ND Extremities: no edema   The results of significant diagnostics from this hospitalization (including imaging, microbiology, ancillary and laboratory) are listed below for reference.     Microbiology: Recent Results (from the  past 240 hour(s))  Blood Culture (routine x 2)     Status: Abnormal   Collection Time: 02/22/17 10:56 PM  Result Value Ref Range Status   Specimen Description BLOOD LEFT ANTECUBITAL  Final   Special Requests   Final    BOTTLES DRAWN AEROBIC AND ANAEROBIC Blood Culture adequate volume   Culture  Setup Time   Final    GRAM POSITIVE COCCI IN CHAINS IN BOTH AEROBIC AND ANAEROBIC BOTTLES CRITICAL RESULT CALLED TO, READ BACK BY AND VERIFIED WITH: A. MASTERS PHARMD, AT 2350 02/23/17 BY D. VANHOOK    Culture (A)  Final    GROUP A STREP (S.PYOGENES) ISOLATED SUSCEPTIBILITIES PERFORMED ON PREVIOUS CULTURE WITHIN THE LAST 5 DAYS. Performed at [facility]  Lab, [address] [place], [place] [address]    Report Status 02/26/2017 FINAL  Final  Blood Culture (routine x 2)     Status: Abnormal   Collection Time: 02/22/17 10:56 PM  Result Value Ref Range Status   Specimen Description BLOOD RIGHT WRIST  Final   Special Requests IN PEDIATRIC BOTTLE Blood Culture adequate volume  Final   Culture  Setup Time   Final    GRAM POSITIVE COCCI IN CHAINS IN PEDIATRIC BOTTLE CRITICAL RESULT CALLED TO, READ BACK BY AND VERIFIED WITH: A. MASTERS PHARMD, AT 2350 02/23/17 BY Rush Landmark Performed at [facility], [place] [address], [place], [name] [address]    Culture GROUP A STREP (S.PYOGENES) ISOLATED (A)  Final   Report Status 02/25/2017 FINAL  Final   Organism ID, Bacteria GROUP A STREP (S.PYOGENES) ISOLATED  Final      Susceptibility   Group a strep (s.pyogenes) isolated - MIC*    PENICILLIN <=0.06 SENSITIVE Sensitive     CEFTRIAXONE <=0.12 SENSITIVE Sensitive     ERYTHROMYCIN <=0.12 SENSITIVE Sensitive     LEVOFLOXACIN 0.5 SENSITIVE Sensitive     VANCOMYCIN 0.25 SENSITIVE Sensitive     * GROUP A STREP (S.PYOGENES) ISOLATED  Blood Culture ID Panel (Reflexed)     Status: Abnormal   Collection Time: 02/22/17 10:56 PM  Result Value Ref Range Status   Enterococcus species NOT DETECTED NOT DETECTED  Final   Listeria monocytogenes NOT DETECTED NOT DETECTED Final   Staphylococcus species NOT DETECTED NOT DETECTED Final   Staphylococcus aureus NOT DETECTED NOT DETECTED Final   Streptococcus species DETECTED (A) NOT DETECTED Final    Comment: CRITICAL RESULT CALLED TO, READ BACK BY AND VERIFIED WITH: A. MASTERS PHARMD, AT 2350 02/23/17 BY D. VANHOOK    Streptococcus agalactiae NOT DETECTED NOT DETECTED Final   Streptococcus pneumoniae NOT DETECTED NOT DETECTED Final   Streptococcus pyogenes DETECTED (A) NOT DETECTED Final    Comment: CRITICAL RESULT CALLED TO, READ BACK BY AND VERIFIED WITH: A. MASTERS PHARMD, AT 2350 02/23/17 BY D. VANHOOK    Acinetobacter baumannii NOT DETECTED NOT DETECTED Final   Enterobacteriaceae species NOT DETECTED NOT DETECTED Final   Enterobacter cloacae complex NOT DETECTED NOT DETECTED Final   Escherichia coli NOT DETECTED NOT DETECTED Final   Klebsiella oxytoca NOT DETECTED NOT DETECTED Final   Klebsiella pneumoniae NOT DETECTED NOT DETECTED Final   Proteus species NOT DETECTED NOT DETECTED Final   Serratia marcescens NOT DETECTED NOT DETECTED Final   Haemophilus influenzae NOT DETECTED NOT DETECTED Final   Neisseria meningitidis NOT DETECTED NOT DETECTED Final   Pseudomonas aeruginosa NOT DETECTED NOT DETECTED Final   Candida albicans NOT DETECTED NOT DETECTED Final   Candida glabrata NOT DETECTED NOT DETECTED Final   Candida krusei NOT DETECTED NOT DETECTED Final   Candida parapsilosis NOT DETECTED NOT DETECTED Final   Candida tropicalis NOT DETECTED NOT DETECTED Final    Comment: Performed at [facility], [place] [address] [place], [place] [address]  Body fluid culture     Status: None   Collection Time: 02/23/17  1:17 AM  Result Value Ref Range Status   Specimen Description SYNOVIAL Bursa  Final   Special Requests NONE  Final   Gram Stain   Final    ABUNDANT WBC PRESENT, PREDOMINANTLY PMN FEW GRAM POSITIVE COCCI IN CHAINS Gram Stain Report  Called to,Read Back By and Verified With: COGGINS,E RN 1.8.19 @0225  ZANDOC    Culture  MODERATE GROUP A STREP (S.PYOGENES) ISOLATED  Final   Report Status 02/26/2017 FINAL  Final     Labs: BNP (last 3 results) No results for input(s): BNP in the last 8760 hours. Basic Metabolic Panel: Recent Labs  Lab 02/22/17 2016 02/23/17 0523 02/23/17 1624 02/24/17 0515 02/26/17 0552  NA 132* 133* 137 135 136  K 3.0* 2.7* 3.4* 4.1 4.4  CL 94* 99*  --  101 103  CO2 27 26  --  25 25  GLUCOSE 171* 150* 116* 200* 94  BUN 20 19  --  17 22*  CREATININE 1.15* 1.04*  --  1.04* 0.95  CALCIUM 8.2* 7.8*  --  7.6* 7.6*  MG  --   --   --  1.5*  --    Liver Function Tests: Recent Labs  Lab 02/22/17 2016  AST 25  ALT 21  ALKPHOS 138*  BILITOT 0.9  PROT 7.1  ALBUMIN 2.4*   No results for input(s): LIPASE, AMYLASE in the last 168 hours. No results for input(s): AMMONIA in the last 168 hours. CBC: Recent Labs  Lab 02/22/17 2016 02/23/17 0523 02/23/17 1624 02/24/17 0515 02/25/17 0512 02/26/17 0552  WBC 32.9* 25.2*  --  21.7* 22.2* 16.3*  NEUTROABS 31.2*  --   --  20.9* 19.1*  --   HGB 11.5* 10.9* 11.6* 9.1* 8.4* 8.5*  HCT 34.9* 33.8* 34.0* 29.4* 27.2* 26.6*  MCV 81.2 81.8  --  82.6 81.9 82.4  PLT 385 404*  --  386 493* 508*   Cardiac Enzymes: No results for input(s): CKTOTAL, CKMB, CKMBINDEX, TROPONINI in the last 168 hours. BNP: Invalid input(s): POCBNP CBG: No results for input(s): GLUCAP in the last 168 hours. D-Dimer No results for input(s): DDIMER in the last 72 hours. Hgb A1c Recent Labs    02/25/17 0512  HGBA1C 6.6*   Lipid Profile No results for input(s): CHOL, HDL, LDLCALC, TRIG, CHOLHDL, LDLDIRECT in the last 72 hours. Thyroid function studies No results for input(s): TSH, T4TOTAL, T3FREE, THYROIDAB in the last 72 hours.  Invalid input(s): FREET3 Anemia work up No results for input(s): VITAMINB12, FOLATE, FERRITIN, TIBC, IRON, RETICCTPCT in the last 72  hours. Urinalysis    Component Value Date/Time   COLORURINE YELLOW 02/22/2017 2034   APPEARANCEUR HAZY (A) 02/22/2017 2034   LABSPEC 1.019 02/22/2017 2034   PHURINE 5.0 02/22/2017 2034   GLUCOSEU NEGATIVE 02/22/2017 2034   HGBUR SMALL (A) 02/22/2017 2034   BILIRUBINUR NEGATIVE 02/22/2017 2034   BILIRUBINUR neg 10/12/2016 1156   BILIRUBINUR neg 05/14/2014 1601   KETONESUR 20 (A) 02/22/2017 2034   PROTEINUR 100 (A) 02/22/2017 2034   UROBILINOGEN 0.2 10/12/2016 1156   NITRITE NEGATIVE 02/22/2017 2034   LEUKOCYTESUR NEGATIVE 02/22/2017 2034   Sepsis Labs Invalid input(s): PROCALCITONIN,  WBC,  LACTICIDVEN Microbiology Recent Results (from the past 240 hour(s))  Blood Culture (routine x 2)     Status: Abnormal   Collection Time: 02/22/17 10:56 PM  Result Value Ref Range Status   Specimen Description BLOOD LEFT ANTECUBITAL  Final   Special Requests   Final    BOTTLES DRAWN AEROBIC AND ANAEROBIC Blood Culture adequate volume   Culture  Setup Time   Final    GRAM POSITIVE COCCI IN CHAINS IN BOTH AEROBIC AND ANAEROBIC BOTTLES CRITICAL RESULT CALLED TO, READ BACK BY AND VERIFIED WITH: A. MASTERS PHARMD, AT 2350 02/23/17 BY D. VANHOOK    Culture (A)  Final    GROUP A STREP (S.PYOGENES) ISOLATED SUSCEPTIBILITIES  PERFORMED ON PREVIOUS CULTURE WITHIN THE LAST 5 DAYS. Performed at [facility], [place] [address], [place], [place] [address]    Report Status 02/26/2017 FINAL  Final  Blood Culture (routine x 2)     Status: Abnormal   Collection Time: 02/22/17 10:56 PM  Result Value Ref Range Status   Specimen Description BLOOD RIGHT WRIST  Final   Special Requests IN PEDIATRIC BOTTLE Blood Culture adequate volume  Final   Culture  Setup Time   Final    GRAM POSITIVE COCCI IN CHAINS IN PEDIATRIC BOTTLE CRITICAL RESULT CALLED TO, READ BACK BY AND VERIFIED WITH: A. MASTERS PHARMD, AT 2350 02/23/17 BY Rush Landmark Performed at [facility], [place] [address] [place], [name] [address]     Culture GROUP A STREP (S.PYOGENES) ISOLATED (A)  Final   Report Status 02/25/2017 FINAL  Final   Organism ID, Bacteria GROUP A STREP (S.PYOGENES) ISOLATED  Final      Susceptibility   Group a strep (s.pyogenes) isolated - MIC*    PENICILLIN <=0.06 SENSITIVE Sensitive     CEFTRIAXONE <=0.12 SENSITIVE Sensitive     ERYTHROMYCIN <=0.12 SENSITIVE Sensitive     LEVOFLOXACIN 0.5 SENSITIVE Sensitive     VANCOMYCIN 0.25 SENSITIVE Sensitive     * GROUP A STREP (S.PYOGENES) ISOLATED  Blood Culture ID Panel (Reflexed)     Status: Abnormal   Collection Time: 02/22/17 10:56 PM  Result Value Ref Range Status   Enterococcus species NOT DETECTED NOT DETECTED Final   Listeria monocytogenes NOT DETECTED NOT DETECTED Final   Staphylococcus species NOT DETECTED NOT DETECTED Final   Staphylococcus aureus NOT DETECTED NOT DETECTED Final   Streptococcus species DETECTED (A) NOT DETECTED Final    Comment: CRITICAL RESULT CALLED TO, READ BACK BY AND VERIFIED WITH: A. MASTERS PHARMD, AT 2350 02/23/17 BY D. VANHOOK    Streptococcus agalactiae NOT DETECTED NOT DETECTED Final   Streptococcus pneumoniae NOT DETECTED NOT DETECTED Final   Streptococcus pyogenes DETECTED (A) NOT DETECTED Final    Comment: CRITICAL RESULT CALLED TO, READ BACK BY AND VERIFIED WITH: A. MASTERS PHARMD, AT 2350 02/23/17 BY D. VANHOOK    Acinetobacter baumannii NOT DETECTED NOT DETECTED Final   Enterobacteriaceae species NOT DETECTED NOT DETECTED Final   Enterobacter cloacae complex NOT DETECTED NOT DETECTED Final   Escherichia coli NOT DETECTED NOT DETECTED Final   Klebsiella oxytoca NOT DETECTED NOT DETECTED Final   Klebsiella pneumoniae NOT DETECTED NOT DETECTED Final   Proteus species NOT DETECTED NOT DETECTED Final   Serratia marcescens NOT DETECTED NOT DETECTED Final   Haemophilus influenzae NOT DETECTED NOT DETECTED Final   Neisseria meningitidis NOT DETECTED NOT DETECTED Final   Pseudomonas aeruginosa NOT DETECTED NOT DETECTED  Final   Candida albicans NOT DETECTED NOT DETECTED Final   Candida glabrata NOT DETECTED NOT DETECTED Final   Candida krusei NOT DETECTED NOT DETECTED Final   Candida parapsilosis NOT DETECTED NOT DETECTED Final   Candida tropicalis NOT DETECTED NOT DETECTED Final    Comment: Performed at [facility], [place] [address], [place], [place] [address]  Body fluid culture     Status: None   Collection Time: 02/23/17  1:17 AM  Result Value Ref Range Status   Specimen Description SYNOVIAL Bursa  Final   Special Requests NONE  Final   Gram Stain   Final    ABUNDANT WBC PRESENT, PREDOMINANTLY PMN FEW GRAM POSITIVE COCCI IN CHAINS Gram Stain Report Called to,Read  Back By and Verified With: COGGINS,E RN 1.8.19 @0225  ZANDOC    Culture MODERATE GROUP A STREP (S.PYOGENES) ISOLATED  Final   Report Status 02/26/2017 FINAL  Final     Time coordinating discharge: 35 minutes  SIGNED:  Chipper Oman, MD  [facility] 02/26/2017, 11:36 AM  Pager please text page via  [email or web address]

## 2017-02-26 NOTE — Social Work (Signed)
CSW met with patient and fried at bedside and they accepted bed offer from [facility].  CSW confirmed bed offer from SNF and the SNF will initiate Insurance Auth.  CSW will continue to follow for disposition.  Elissa Hefty, LCSW Clinical Social Worker [phone]

## 2017-02-26 NOTE — Progress Notes (Signed)
Physical Therapy Treatment Patient Details Name: [name] MRN: [id number] DOB: May 31, 1935 Today's Date: 02/26/2017    History of Present Illness Pt is a 82 y/o female who presents with a L septic shoulder. She is now s/p I&D of L shoulder on 02/23/17.     PT Comments    Pt is up to walk with total lack of recall of taking a longer walk with PT recently.  Her IV is in her foot and used care for mobility to avoid disturbing it.  Has good control of balance by comparison with her last PT session and so will progress her as able for gait and then try stairs next visit if able.  Pt may not be able due to her IV placement.     Follow Up Recommendations  SNF;Supervision/Assistance - 24 hour     Equipment Recommendations  None recommended by PT(pt declines cane)    Recommendations for Other Services       Precautions / Restrictions Precautions Precautions: Fall Restrictions Weight Bearing Restrictions: No    Mobility  Bed Mobility Overal bed mobility: Needs Assistance Bed Mobility: Supine to Sit     Supine to sit: Supervision     General bed mobility comments: OOB in recliner upon arrival   Transfers Overall transfer level: Needs assistance Equipment used: None Transfers: Sit to/from Stand Sit to Stand: Min guard         General transfer comment: gave pt some increased time and could stand with no help  Ambulation/Gait Ambulation/Gait assistance: Min guard Ambulation Distance (Feet): 150 Feet Assistive device: None Gait Pattern/deviations: Step-through pattern;Decreased stride length;Narrow base of support;Trunk flexed Gait velocity: Decreased Gait velocity interpretation: Below normal speed for age/gender General Gait Details: pt is maintaining control of walk but does not remember having done this previously   Financial trader Rankin (Stroke Patients Only)       Balance Overall balance assessment: Needs  assistance Sitting-balance support: Feet supported Sitting balance-Leahy Scale: Good     Standing balance support: No upper extremity supported Standing balance-Leahy Scale: Fair Standing balance comment: dynamic balance occas assist but static indepe                            Cognition Arousal/Alertness: Awake/alert Behavior During Therapy: WFL for tasks assessed/performed Overall Cognitive Status: Within Functional Limits for tasks assessed                                 General Comments: pt did not recall taking a longer walk with rehab recently      Exercises Shoulder Exercises Shoulder Flexion: Self ROM;Left;Seated;10 reps Shoulder ABduction: Self ROM;Left;10 reps;Seated Shoulder External Rotation: Self ROM;10 reps;Left;Seated(to neutral) Elbow Flexion: AROM;10 reps;Left;Seated Wrist Flexion: AROM;10 reps;Left;Seated Wrist Extension: AROM;10 reps;Left;Seated Digit Composite Flexion: AROM;10 reps;Left;Seated Composite Extension: AROM;10 reps;Left;Seated Hand Exercises Forearm Supination: AROM;10 reps;Left;Seated Forearm Pronation: AROM;10 reps;Left;Seated    General Comments        Pertinent Vitals/Pain Pain Assessment: No/denies pain Pain Score: 3  Pain Location: LUE Pain Descriptors / Indicators: Sore Pain Intervention(s): Limited activity within patient's tolerance;Monitored during session;Repositioned    Home Living                      Prior Function  PT Goals (current goals can now be found in the care plan section) Acute Rehab PT Goals Patient Stated Goal: Return home after rehab Progress towards PT goals: Progressing toward goals    Frequency    Min 3X/week      PT Plan Current plan remains appropriate    Co-evaluation              AM-PAC PT "6 Clicks" Daily Activity  Outcome Measure  Difficulty turning over in bed (including adjusting bedclothes, sheets and blankets)?: A  Little Difficulty moving from lying on back to sitting on the side of the bed? : A Little Difficulty sitting down on and standing up from a chair with arms (e.g., wheelchair, bedside commode, etc,.)?: A Little Help needed moving to and from a bed to chair (including a wheelchair)?: A Little Help needed walking in hospital room?: A Little Help needed climbing 3-5 steps with a railing? : A Little 6 Click Score: 18    End of Session Equipment Utilized During Treatment: Gait belt Activity Tolerance: Patient tolerated treatment well Patient left: in chair;with call bell/phone within reach;with chair alarm set;with family/visitor present Nurse Communication: Mobility status PT Visit Diagnosis: Unsteadiness on feet (R26.81);Other abnormalities of gait and mobility (R26.89)     Time: 7026-3785 PT Time Calculation (min) (ACUTE ONLY): 16 min  Charges:  $Gait Training: 8-22 mins                    G Codes:  Functional Assessment Tool Used: AM-PAC 6 Clicks Basic Mobility     Ramond Dial 02/26/2017, 1:10 PM  Mee Hives, PT MS Acute Rehab Dept. Number: [place] [facility] [place] [facility] [place]

## 2017-02-26 NOTE — Social Work (Signed)
Clinical Social Worker facilitated patient discharge including contacting patient family and facility to confirm patient discharge plans.  Clinical information faxed to facility and family agreeable with plan.    CSW arranged ambulance transport via [facility] to [facility].    RN to call [phone] to give report prior to discharge. Pt going to Room 112.  Clinical Social Worker will sign off for now as social work intervention is no longer needed. Please consult [place] again if new need arises.  Elissa Hefty, LCSW Clinical Social Worker [phone]

## 2017-03-01 ENCOUNTER — Telehealth: Payer: Self-pay

## 2017-03-01 ENCOUNTER — Other Ambulatory Visit: Payer: Self-pay

## 2017-03-01 MED ORDER — OXYCODONE HCL 5 MG PO TABS: 5.0000 mg | ORAL_TABLET | Freq: Three times a day (TID) | ORAL | 0 refills | Status: DC | PRN

## 2017-03-01 NOTE — Telephone Encounter (Signed)
Form faxed to [place] Fax [phone]   [facility] at facility checked and patient has not taken any since admission, is only taking Tylenol.

## 2017-03-01 NOTE — Telephone Encounter (Signed)
This is a patient of [place], who was admitted to [facility] after hospitalization. [facility] F/U is needed. Hospital discharge from [facility] on 02/26/2017

## 2017-03-03 ENCOUNTER — Encounter: Payer: Self-pay | Admitting: Internal Medicine

## 2017-03-03 ENCOUNTER — Non-Acute Institutional Stay (SKILLED_NURSING_FACILITY): Payer: Medicare Other | Admitting: Internal Medicine

## 2017-03-03 DIAGNOSIS — R739 Hyperglycemia, unspecified: Secondary | ICD-10-CM

## 2017-03-03 DIAGNOSIS — I10 Essential (primary) hypertension: Secondary | ICD-10-CM | POA: Diagnosis not present

## 2017-03-03 DIAGNOSIS — M7110 Other infective bursitis, unspecified site: Secondary | ICD-10-CM

## 2017-03-03 NOTE — Assessment & Plan Note (Signed)
A1c indicates excellent diabetic control at 6.6%

## 2017-03-03 NOTE — Assessment & Plan Note (Addendum)
PT/OT 14 day total of Rocephin and subsequent oral amoxicillin transition Decrease Tylenol to 1000 mg every 8 hours as needed to prevent hepatotoxicity Lidocaine 4% OTC patch daily for 12 hours

## 2017-03-03 NOTE — Assessment & Plan Note (Signed)
BP controlled; no change in antihypertensive medications  

## 2017-03-03 NOTE — Patient Instructions (Signed)
See assessment and plan under each diagnosis in the problem list and acutely for this visit 

## 2017-03-03 NOTE — Progress Notes (Signed)
NURSING HOME LOCATION:  [facility] ROOM NUMBER:  [id number]  CODE STATUS:  Full Code  PCP:  Gayland Curry, DO  [place] [id number]   This is a comprehensive admission note to [facility] performed on this date less than 30 days from date of admission. Included are preadmission medical/surgical history;reconciled medication list; family history; social history and comprehensive review of systems.  Corrections and additions to the records were documented . Comprehensive physical exam was also performed. Additionally a clinical summary was entered for each active diagnosis pertinent to this admission in the Problem List to enhance continuity of care.  HPI: The patient was hospitalized 1/7-1/11/19 with progressive shoulder pain despite a prednisone taper for 5 days . Subsequently she had fever up to 102.4. Leukocytosis was documented in the ED. CT of the shoulder showed a fluid collection with bursitis, patient was admitted for septic joint. She underwent an I&D of the shoulder. Blood and wound cultures revealed group A strep infection. She received Rocephin with transition to oral amoxicillin to complete 14 days total of antibiotics. PT recommended SNF for rehabilitation. Discharge labs were normal except for BUN 22, calcium 7.6, GFR 55, and normochromic, normocytic anemia with hemoglobin 8.5 hematocrit 26.6. Hemoglobin A1c was compatible with well-controlled diabetes with a value of 6.6%  Past medical and surgical history: Includes dyslipidemia, hypertension, history of peptic ulcer disease, nontoxic multinodular goiter, cholelithiasis, endometriosis and hyperglycemia. Significant surgeries include hemigastrectomy for perforated ulcer and hysterectomy.  Social history: She quit smoking 1995 after 40 pack years. She does not drink alcohol. She is self described as recovering alcoholic since 5277.  Family history: Limited history reviewed.  Review of systems: She  describes some residual tightness and discomfort in the left upper extremity but dramatic improvement in the pain. She has refused the narcotic pain medicines because of her recovering alcoholic status. She states Tylenol provides adequate relief and she is only taking 1-2 pills per day. Despite the anemia she denies any bleeding dyscrasias. She has no clue as to how she got the strep infection. She specifically denies any associated RTI,trauma, injury, or other predisposing factors.  Constitutional: No fever,significant weight change, fatigue  Eyes: No redness, discharge, pain, vision change ENT/mouth: No nasal congestion,  purulent discharge, earache,change in hearing ,sore throat  Cardiovascular: No chest pain, palpitations,paroxysmal nocturnal dyspnea, claudication, edema  Respiratory: No cough, sputum production,hemoptysis, DOE , significant snoring,apnea  Gastrointestinal: No heartburn,dysphagia,abdominal pain, nausea / vomiting,rectal bleeding, melena,change in bowels Genitourinary: No dysuria,hematuria, pyuria,  incontinence, nocturia Musculoskeletal: No joint stiffness, joint swelling, weakness Dermatologic: No rash, pruritus, change in appearance of skin Neurologic: No dizziness,headache,syncope, seizures, numbness , tingling Psychiatric: No significant anxiety , depression, insomnia, anorexia Endocrine: No change in hair/skin/ nails, excessive thirst, excessive hunger, excessive urination  Hematologic/lymphatic: No significant bruising, lymphadenopathy,abnormal bleeding Allergy/immunology: No itchy/ watery eyes, significant sneezing, urticaria, angioedema  Physical exam:  Pertinent or positive findings:She appears thin and somewhat suboptimally nourished. Breath sounds are decreased. Pedal pulses are decreased. The left surgical wound site is dressed. There is no visible cellulitis. Range of motion of the left upper extremity is decreased. To elevate left upper extremity she uses her  right hand.  General appearance:no acute distress , increased work of breathing is present.   Lymphatic: No lymphadenopathy about the head, neck, axilla . Eyes: No conjunctival inflammation or lid edema is present. There is no scleral icterus. Ears:  External ear exam shows no significant lesions or deformities.  Nose:  External nasal examination shows no deformity or inflammation. Nasal mucosa are pink and moist without lesions ,exudates Oral exam: lips and gums are healthy appearing.There is no oropharyngeal erythema or exudate . Neck:  No thyromegaly, masses, tenderness noted.    Heart:  Normal rate and regular rhythm. S1 and S2 normal without gallop, murmur, click, rub .  Lungs:Chest clear to auscultation without wheezes, rhonchi,rales , rubs. Abdomen:Bowel sounds are normal. Abdomen is soft and nontender with no organomegaly, hernias,masses. GU: deferred  Extremities:  No cyanosis, clubbing,edema  Neurologic exam :strength good in LE Skin: Warm & dry w/o tenting. No significant lesions or rash.  See clinical summary under each active problem in the Problem List with associated updated therapeutic plan

## 2017-03-04 ENCOUNTER — Other Ambulatory Visit: Payer: Self-pay | Admitting: Internal Medicine

## 2017-03-04 DIAGNOSIS — I10 Essential (primary) hypertension: Secondary | ICD-10-CM

## 2017-03-08 ENCOUNTER — Other Ambulatory Visit: Payer: Self-pay | Admitting: Internal Medicine

## 2017-03-10 ENCOUNTER — Ambulatory Visit (INDEPENDENT_AMBULATORY_CARE_PROVIDER_SITE_OTHER): Payer: Medicare Other | Admitting: Physician Assistant

## 2017-03-10 ENCOUNTER — Other Ambulatory Visit (INDEPENDENT_AMBULATORY_CARE_PROVIDER_SITE_OTHER): Payer: Self-pay

## 2017-03-10 ENCOUNTER — Encounter (INDEPENDENT_AMBULATORY_CARE_PROVIDER_SITE_OTHER): Payer: Self-pay | Admitting: Physician Assistant

## 2017-03-10 VITALS — Ht 64.0 in | Wt 104.0 lb

## 2017-03-10 DIAGNOSIS — R7881 Bacteremia: Secondary | ICD-10-CM

## 2017-03-10 DIAGNOSIS — L02414 Cutaneous abscess of left upper limb: Secondary | ICD-10-CM

## 2017-03-10 DIAGNOSIS — B955 Unspecified streptococcus as the cause of diseases classified elsewhere: Secondary | ICD-10-CM

## 2017-03-10 MED ORDER — LIDO-CAPSAICIN-MEN-METHYL SAL 0.5-0.035-5-20 % EX PTCH: MEDICATED_PATCH | CUTANEOUS | 1 refills | Status: DC

## 2017-03-10 NOTE — Progress Notes (Signed)
[name] returns today follow-up on 15 days status post irrigation debridement left shoulder abscess.  She was found to have strep fax a bacteremia.  She is being treated with amoxicillin.  She denies any fevers chills shortness of breath.  She states she is having some pain down the left arm.  She is only taking Tylenol for pain.  States that her primary care physician is setting up some home health physical therapy.  She does feel that her range of motion is improving.  Is concerned about her decreased range of motion of the shoulder though.  Physical exam: General well-developed well-nourished female in no acute distress.  Alert and oriented. Left shoulder: Sutures well approximated the incision site.  She does have some puncture wounds about the left shoulder.  No erythema no abnormal warmth.  Patient is able to extend to approximately 100 degrees passively I can bring her to 110.  Good range of motion of the left elbow.  Left hand sensation and motor intact.  Impression: 15 days status post I&D of left shoulder abscess  Plan: Left shoulder is prepped with Betadine and ethyl chloride used to anesthetize skin and then 3 cc of lidocaine was used to further anesthetize skin and 35 cc of serosanguineous type fluid is aspirated patient tolerates well.  Sutures removed Steri-Strips applied.  She can get the incision wet in shower.  We will see her back in a week to check her progress lack of she will follow-up sooner if she develops any redness or signs of infection.  Questions encouraged and answered at length

## 2017-03-11 ENCOUNTER — Ambulatory Visit: Payer: Medicare Other | Admitting: Nurse Practitioner

## 2017-03-11 DIAGNOSIS — I10 Essential (primary) hypertension: Secondary | ICD-10-CM | POA: Diagnosis not present

## 2017-03-11 DIAGNOSIS — B950 Streptococcus, group A, as the cause of diseases classified elsewhere: Secondary | ICD-10-CM | POA: Diagnosis not present

## 2017-03-11 DIAGNOSIS — M71012 Abscess of bursa, left shoulder: Secondary | ICD-10-CM | POA: Diagnosis not present

## 2017-03-11 DIAGNOSIS — R278 Other lack of coordination: Secondary | ICD-10-CM | POA: Diagnosis not present

## 2017-03-11 DIAGNOSIS — R739 Hyperglycemia, unspecified: Secondary | ICD-10-CM | POA: Diagnosis not present

## 2017-03-12 ENCOUNTER — Other Ambulatory Visit: Payer: Self-pay | Admitting: Internal Medicine

## 2017-03-12 ENCOUNTER — Telehealth (INDEPENDENT_AMBULATORY_CARE_PROVIDER_SITE_OTHER): Payer: Self-pay | Admitting: Orthopaedic Surgery

## 2017-03-12 DIAGNOSIS — M71012 Abscess of bursa, left shoulder: Secondary | ICD-10-CM | POA: Diagnosis not present

## 2017-03-12 DIAGNOSIS — R278 Other lack of coordination: Secondary | ICD-10-CM | POA: Diagnosis not present

## 2017-03-12 DIAGNOSIS — I10 Essential (primary) hypertension: Secondary | ICD-10-CM | POA: Diagnosis not present

## 2017-03-12 DIAGNOSIS — R739 Hyperglycemia, unspecified: Secondary | ICD-10-CM | POA: Diagnosis not present

## 2017-03-12 DIAGNOSIS — B950 Streptococcus, group A, as the cause of diseases classified elsewhere: Secondary | ICD-10-CM | POA: Diagnosis not present

## 2017-03-12 NOTE — Telephone Encounter (Signed)
IC and told Liji.

## 2017-03-12 NOTE — Telephone Encounter (Signed)
There ar no restrictions with her left shoulder in terms of theryapy.  They can work with her as comfort allows.

## 2017-03-12 NOTE — Telephone Encounter (Signed)
Please advise on orders

## 2017-03-12 NOTE — Telephone Encounter (Signed)
[name] (PT) with [facility] called needing verbal orders for HHPT 1 wk 2. She advised (OT) will be working on patient's shoulder. The number to contact Liji is [phone]

## 2017-03-12 NOTE — Telephone Encounter (Signed)
I called and spoke with Liji and gave verbal ok for physical therapy for shoulder.

## 2017-03-15 ENCOUNTER — Encounter: Payer: Self-pay | Admitting: Nurse Practitioner

## 2017-03-15 ENCOUNTER — Ambulatory Visit (INDEPENDENT_AMBULATORY_CARE_PROVIDER_SITE_OTHER): Payer: Medicare Other | Admitting: Nurse Practitioner

## 2017-03-15 VITALS — BP 128/74 | HR 71 | Temp 97.9°F | Ht 64.0 in | Wt 103.0 lb

## 2017-03-15 DIAGNOSIS — M7110 Other infective bursitis, unspecified site: Secondary | ICD-10-CM | POA: Diagnosis not present

## 2017-03-15 DIAGNOSIS — R739 Hyperglycemia, unspecified: Secondary | ICD-10-CM | POA: Diagnosis not present

## 2017-03-15 DIAGNOSIS — D649 Anemia, unspecified: Secondary | ICD-10-CM

## 2017-03-15 DIAGNOSIS — I10 Essential (primary) hypertension: Secondary | ICD-10-CM

## 2017-03-15 DIAGNOSIS — T8131XA Disruption of external operation (surgical) wound, not elsewhere classified, initial encounter: Secondary | ICD-10-CM | POA: Diagnosis not present

## 2017-03-15 LAB — CBC WITH DIFFERENTIAL/PLATELET
BASOS ABS: 31 {cells}/uL (ref 0–200)
BASOS PCT: 0.6 %
EOS ABS: 78 {cells}/uL (ref 15–500)
Eosinophils Relative: 1.5 %
HCT: 26.5 % — ABNORMAL LOW (ref 35.0–45.0)
Hemoglobin: 8.5 g/dL — ABNORMAL LOW (ref 11.7–15.5)
Lymphs Abs: 1518 cells/uL (ref 850–3900)
MCH: 26.2 pg — AB (ref 27.0–33.0)
MCHC: 32.1 g/dL (ref 32.0–36.0)
MCV: 81.5 fL (ref 80.0–100.0)
MPV: 9 fL (ref 7.5–12.5)
Monocytes Relative: 10.6 %
NEUTROS PCT: 58.1 %
Neutro Abs: 3021 cells/uL (ref 1500–7800)
PLATELETS: 555 10*3/uL — AB (ref 140–400)
RBC: 3.25 10*6/uL — ABNORMAL LOW (ref 3.80–5.10)
RDW: 15.7 % — ABNORMAL HIGH (ref 11.0–15.0)
TOTAL LYMPHOCYTE: 29.2 %
WBC: 5.2 10*3/uL (ref 3.8–10.8)
WBCMIX: 551 {cells}/uL (ref 200–950)

## 2017-03-15 LAB — COMPLETE METABOLIC PANEL WITH GFR
AG RATIO: 1.1 (calc) (ref 1.0–2.5)
ALBUMIN MSPROF: 3.5 g/dL — AB (ref 3.6–5.1)
ALT: 19 U/L (ref 6–29)
AST: 28 U/L (ref 10–35)
Alkaline phosphatase (APISO): 121 U/L (ref 33–130)
BUN / CREAT RATIO: 24 (calc) — AB (ref 6–22)
BUN: 24 mg/dL (ref 7–25)
CALCIUM: 8.6 mg/dL (ref 8.6–10.4)
CO2: 22 mmol/L (ref 20–32)
CREATININE: 1 mg/dL — AB (ref 0.60–0.93)
Chloride: 103 mmol/L (ref 98–110)
GFR, EST AFRICAN AMERICAN: 62 mL/min/{1.73_m2} (ref >=60)
GFR, EST NON AFRICAN AMERICAN: 54 mL/min/{1.73_m2} — AB (ref >=60)
GLOBULIN: 3.1 g/dL (ref 1.9–3.7)
Glucose, Bld: 71 mg/dL (ref 65–139)
POTASSIUM: 4.8 mmol/L (ref 3.5–5.3)
SODIUM: 135 mmol/L (ref 135–146)
TOTAL PROTEIN: 6.6 g/dL (ref 6.1–8.1)
Total Bilirubin: 0.2 mg/dL (ref 0.2–1.2)

## 2017-03-15 NOTE — Progress Notes (Signed)
Careteam: Patient Care Team: Gayland Curry, DO as PCP - General (Geriatric Medicine) Alden Hipp, MD as Consulting Physician (Obstetrics and Gynecology) Rutherford Guys, MD as Consulting Physician (Ophthalmology)  Advanced Directive information    No Known Allergies  Chief Complaint  Patient presents with  . Follow-up    Pt is being seen to follow up on recent stay at [facility] 02/26/17 to 03/08/17.      HPI: Patient is a 82 y.o. female seen in the office today to follow up hospitalization and SNF stay. She was hospitalized 02/22/17-02/26/17 due to septic shoulder. She was originally seen in office due to shoulder pain and started on prednisone taper. She returned feeling worse with fever, tachycardia with worsening of pain,swelling, redness to left shoulder. She was than transferred to hospital and treated for septic shoulder with abscess. She is now s/p I&D per ortho; Blood and wound cultures revealed group A strep infection and was treated with Rocephin followed by amoxicillin for 14 days.  She was transferred to [facility] for PT/OT.  She is now home.  Has followed up with orthopedics on 03/10/17 who aspirated shoulder and got 35 cc of serosanguineous fluid  Sutures were removed at that appt and butterfly bandages were applied but they have not held up well Was told to look at shoulder and make sure it is not red or tender.  She is having drainage however and reinforced the bandage. Now wet because she took a shoulder Pain/discomfort is getting better every day. Feels like everything is going great Home health OT coming out now which has been great.   Review of Systems:  Review of Systems  Constitutional: Negative for chills, fever and malaise/fatigue.  Respiratory: Negative for shortness of breath.   Cardiovascular: Negative for chest pain.  Musculoskeletal:       Pain in left shoulder much better, weakness to left arm  Skin: Negative for itching and rash.    Neurological: Negative for weakness.    Past Medical History:  Diagnosis Date  . Age-related macular degeneration, dry, both eyes   . Anemia   . Anxiety   . Cholelithiasis   . Endometriosis   . Goiter   . Herpes simplex   . Hyperlipidemia   . Hypertension   . Loss of weight   . Nontoxic multinodular goiter   . Other abnormal blood chemistry   . PUD (peptic ulcer disease)   . Renal cyst   . Sciatica   . Scoliosis (and kyphoscoliosis), idiopathic   . Unspecified disorder of kidney and ureter   . Urinary frequency    Past Surgical History:  Procedure Laterality Date  . ABDOMINAL HYSTERECTOMY  1971  . CATARACT EXTRACTION W/ INTRAOCULAR LENS  IMPLANT, BILATERAL Bilateral 2016   Dr. Rutherford Guys  . IRRIGATION AND DEBRIDEMENT ABSCESS Left 02/23/2017   Procedure: IRRIGATION AND DEBRIDEMENT ABSCESS;  Surgeon: Mcarthur Rossetti, MD;  Location: [facility];  Service: Orthopedics;  Laterality: Left;  . REPAIR OF PERFORATED ULCER  1973 &1975   Dr Vida Rigger  . STOMACH SURGERY  [phone]   removed 1/2 stomach; "had a total of 5 ORs from 1973-1980; all related to perforated ulcer"  . TONSILLECTOMY     Social History:   reports that she quit smoking about 23 years ago. Her smoking use included cigarettes. She has a 40.00 pack-year smoking history. she has never used smokeless tobacco. She reports that she does not drink alcohol or use drugs.  Family History  Problem Relation Age of Onset  . Obesity Mother     Medications: Patient's Medications  New Prescriptions   No medications on file  Previous Medications   ACETAMINOPHEN (TYLENOL) 500 MG TABLET    Take 1,000 mg by mouth every 6 (six) hours as needed for mild pain.   ALPRAZOLAM (XANAX) 0.25 MG TABLET    take 1 tablet by mouth at bedtime if needed   BYSTOLIC 10 MG TABLET    TAKE 1/2 TABLET BY MOUTH ONCE DAILY   EXFORGE 10-320 MG TABLET    take 1 tablet by mouth once daily  Modified Medications   No medications on file   Discontinued Medications   LIDO-CAPSAICIN-MEN-METHYL SAL (MEDI-PATCH-LIDOCAINE) 0.5-0.035-5-20 % PTCH    Apply patch onto affected area, 12 hours patch on; 12 hours patch off   OXYCODONE (OXY IR/ROXICODONE) 5 MG IMMEDIATE RELEASE TABLET    Take 1 tablet (5 mg total) by mouth every 8 (eight) hours as needed for up to 21 doses for severe pain.     Physical Exam:  Vitals:   03/15/17 1301  BP: 128/74  Pulse: 71  Temp: 97.9 F (36.6 C)  TempSrc: Oral  SpO2: 97%  Weight: 103 lb (46.7 kg)  Height: 5\' 4"  (1.626 m)   Body mass index is 17.68 kg/m.  Physical Exam  Constitutional: She is oriented to person, place, and time. She appears well-developed and well-nourished.  Thin frail female  Cardiovascular: Normal rate, regular rhythm and normal heart sounds.  Pulmonary/Chest: Effort normal and breath sounds normal.  Musculoskeletal: She exhibits no edema.       Right shoulder: She exhibits decreased range of motion, tenderness and pain. She exhibits no swelling, no effusion, no deformity and no laceration.  Incision to left shoulder well healed except for bottom 2 cm area that is not approximated and has cloudy serous drainage. No redness or increase tenderness around area    Neurological: She is alert and oriented to person, place, and time.  Skin: Skin is warm and dry. Capillary refill takes less than 2 seconds.  Psychiatric: She has a normal mood and affect.    Labs reviewed: Basic Metabolic Panel: Recent Labs    07/01/16 0808  02/23/17 0523 02/23/17 1624 02/24/17 0515 02/26/17 0552  NA 138   < > 133* 137 135 136  K 4.6   < > 2.7* 3.4* 4.1 4.4  CL 107   < > 99*  --  101 103  CO2 22   < > 26  --  25 25  GLUCOSE 100*   < > 150* 116* 200* 94  BUN 24   < > 19  --  17 22*  CREATININE 1.06*   < > 1.04*  --  1.04* 0.95  CALCIUM 9.4   < > 7.8*  --  7.6* 7.6*  MG  --   --   --   --  1.5*  --   TSH 1.01  --   --   --   --   --    < > = values in this interval not displayed.    Liver Function Tests: Recent Labs    07/01/16 0808 02/22/17 2016  AST 18 25  ALT 10 21  ALKPHOS 89 138*  BILITOT 0.4 0.9  PROT 6.7 7.1  ALBUMIN 4.1 2.4*   No results for input(s): LIPASE, AMYLASE in the last 8760 hours. No results for input(s): AMMONIA in the last 8760 hours. CBC: Recent Labs  02/22/17 2016  02/24/17 0515 02/25/17 0512 02/26/17 0552  WBC 32.9*   < > 21.7* 22.2* 16.3*  NEUTROABS 31.2*  --  20.9* 19.1*  --   HGB 11.5*   < > 9.1* 8.4* 8.5*  HCT 34.9*   < > 29.4* 27.2* 26.6*  MCV 81.2   < > 82.6 81.9 82.4  PLT 385   < > 386 493* 508*   < > = values in this interval not displayed.   Lipid Panel: Recent Labs    07/01/16 0808  CHOL 200*  HDL 141  LDLCALC 44  TRIG 76  CHOLHDL 1.4   TSH: Recent Labs    07/01/16 0808  TSH 1.01   A1C: Lab Results  Component Value Date   HGBA1C 6.6 (H) 02/25/2017     Assessment/Plan 1. Dehiscence of operative wound, initial encounter -incision appears to have opened to bottom 2 cm, well approximated and healed throughout the rest of her incision.  No increase in edema, redness or tenderness - COMPLETE METABOLIC PANEL WITH GFR - CBC with Differential/Platelets - WOUND CULTURE, due to ongoing seropurulent drainage  2. Septic bursitis -doing well after I&D, has completed rocephin and amoxicillin for 14 days.  -35 cc or serosanguinous fluid aspirated on the jan 23rd by ortho and follow up again 1/30, reports ongoing drainage but no increase in pain, redness or swelling.  - WOUND CULTURE sent   3. Anemia, unspecified type Noted during hospitalization, will follow up lab - CBC with Differential/Platelets  4. Hyperglycemia -A1c 6.6 during hospitalization. Glucose on lab 170-200s. Pt reports no one told her anything about her elevated blood sugars which could be stress/prednisone related. Will follow up glucose on lab today.  5. Essential hypertension, benign Blood pressure stable, will cont current regimen.    Next appt: as scheduled with Dr Mariea Clonts, to keep follow up with Orthopedic on 1/30, may need follow up with ID based on culture report.  Carlos American. Harle Battiest  [facility] [phone] 8 am - 5 pm) [phone] (after hours)  CC: Erskine Emery, PA

## 2017-03-15 NOTE — Patient Instructions (Signed)
To use gauze over incision, make sure you do not keep wet dressing on incision.  Notify ortho or [place] [facility] care if redness, swelling or tenderness increases

## 2017-03-16 DIAGNOSIS — M71012 Abscess of bursa, left shoulder: Secondary | ICD-10-CM | POA: Diagnosis not present

## 2017-03-16 DIAGNOSIS — R739 Hyperglycemia, unspecified: Secondary | ICD-10-CM | POA: Diagnosis not present

## 2017-03-16 DIAGNOSIS — B950 Streptococcus, group A, as the cause of diseases classified elsewhere: Secondary | ICD-10-CM | POA: Diagnosis not present

## 2017-03-16 DIAGNOSIS — R278 Other lack of coordination: Secondary | ICD-10-CM | POA: Diagnosis not present

## 2017-03-16 DIAGNOSIS — I10 Essential (primary) hypertension: Secondary | ICD-10-CM | POA: Diagnosis not present

## 2017-03-17 ENCOUNTER — Ambulatory Visit (INDEPENDENT_AMBULATORY_CARE_PROVIDER_SITE_OTHER): Payer: Medicare Other | Admitting: Physician Assistant

## 2017-03-17 ENCOUNTER — Other Ambulatory Visit (INDEPENDENT_AMBULATORY_CARE_PROVIDER_SITE_OTHER): Payer: Self-pay

## 2017-03-17 ENCOUNTER — Encounter (INDEPENDENT_AMBULATORY_CARE_PROVIDER_SITE_OTHER): Payer: Self-pay | Admitting: Physician Assistant

## 2017-03-17 DIAGNOSIS — L02414 Cutaneous abscess of left upper limb: Secondary | ICD-10-CM

## 2017-03-17 MED ORDER — CEPHALEXIN 500 MG PO CAPS: 500.0000 mg | ORAL_CAPSULE | Freq: Two times a day (BID) | ORAL | 0 refills | Status: DC

## 2017-03-17 MED ORDER — TRAMADOL HCL 50 MG PO TABS: 50.0000 mg | ORAL_TABLET | Freq: Every evening | ORAL | 0 refills | Status: DC | PRN

## 2017-03-17 NOTE — Progress Notes (Signed)
Mrs. Rivere returns today follow-up of her left shoulder abscess.  She is had no fevers chills.  She states she has had some drainage from the distal incision but otherwise feels that her range of motion is improving her pain is dissipating.  She been going to physical therapy and they have been pleased with her progress.  She was seen at her primary care physician's office on Monday and CBC, complete metabolic panel and a wound culture were all sent.  Her white blood count was 5200, wound culture showed streptococcus pyogenes.   Review of systems: No fevers chills shortness of breath or chest pain.  Physical exam: General well-developed well-nourished female no acute distress mood affect appropriate. Left shoulder distal incision small dehiscence of the wound with expressible serosanguineous fluid.  There is no erythema or malodor.  Remainder the incisions healed well with no signs of infection or dehiscence.  Good range of motion of the left shoulder without significant pain.  Impression: Status post left shoulder irrigation and debridement of abscess  Plan: Give her some tramadol to take at night for pain.  Place her on Keflex 500 mg twice daily.  She is given mupirocin ointment to apply to the distal incision.  She is to wash the incision with an antibacterial soap daily.  We will see her back in 2 weeks sooner if there is any signs of infection i.e. erythema purulence fevers or chills.  Questions are encouraged and answered at length today.

## 2017-03-18 ENCOUNTER — Telehealth (INDEPENDENT_AMBULATORY_CARE_PROVIDER_SITE_OTHER): Payer: Self-pay | Admitting: Physician Assistant

## 2017-03-18 DIAGNOSIS — I10 Essential (primary) hypertension: Secondary | ICD-10-CM | POA: Diagnosis not present

## 2017-03-18 DIAGNOSIS — B950 Streptococcus, group A, as the cause of diseases classified elsewhere: Secondary | ICD-10-CM | POA: Diagnosis not present

## 2017-03-18 DIAGNOSIS — R739 Hyperglycemia, unspecified: Secondary | ICD-10-CM | POA: Diagnosis not present

## 2017-03-18 DIAGNOSIS — R278 Other lack of coordination: Secondary | ICD-10-CM | POA: Diagnosis not present

## 2017-03-18 DIAGNOSIS — M71012 Abscess of bursa, left shoulder: Secondary | ICD-10-CM | POA: Diagnosis not present

## 2017-03-18 LAB — WOUND CULTURE
MICRO NUMBER:: 90120287
SPECIMEN QUALITY:: ADEQUATE

## 2017-03-18 NOTE — Telephone Encounter (Signed)
Called.

## 2017-03-18 NOTE — Telephone Encounter (Signed)
Please call pt she would like to speak with you

## 2017-03-19 DIAGNOSIS — R739 Hyperglycemia, unspecified: Secondary | ICD-10-CM | POA: Diagnosis not present

## 2017-03-19 DIAGNOSIS — R278 Other lack of coordination: Secondary | ICD-10-CM | POA: Diagnosis not present

## 2017-03-19 DIAGNOSIS — I10 Essential (primary) hypertension: Secondary | ICD-10-CM | POA: Diagnosis not present

## 2017-03-19 DIAGNOSIS — M71012 Abscess of bursa, left shoulder: Secondary | ICD-10-CM | POA: Diagnosis not present

## 2017-03-19 DIAGNOSIS — B950 Streptococcus, group A, as the cause of diseases classified elsewhere: Secondary | ICD-10-CM | POA: Diagnosis not present

## 2017-03-23 DIAGNOSIS — M71012 Abscess of bursa, left shoulder: Secondary | ICD-10-CM | POA: Diagnosis not present

## 2017-03-23 DIAGNOSIS — B950 Streptococcus, group A, as the cause of diseases classified elsewhere: Secondary | ICD-10-CM | POA: Diagnosis not present

## 2017-03-23 DIAGNOSIS — R278 Other lack of coordination: Secondary | ICD-10-CM | POA: Diagnosis not present

## 2017-03-23 DIAGNOSIS — R739 Hyperglycemia, unspecified: Secondary | ICD-10-CM | POA: Diagnosis not present

## 2017-03-23 DIAGNOSIS — I10 Essential (primary) hypertension: Secondary | ICD-10-CM | POA: Diagnosis not present

## 2017-03-26 DIAGNOSIS — M71012 Abscess of bursa, left shoulder: Secondary | ICD-10-CM | POA: Diagnosis not present

## 2017-03-26 DIAGNOSIS — I10 Essential (primary) hypertension: Secondary | ICD-10-CM | POA: Diagnosis not present

## 2017-03-26 DIAGNOSIS — B950 Streptococcus, group A, as the cause of diseases classified elsewhere: Secondary | ICD-10-CM | POA: Diagnosis not present

## 2017-03-26 DIAGNOSIS — R278 Other lack of coordination: Secondary | ICD-10-CM | POA: Diagnosis not present

## 2017-03-26 DIAGNOSIS — R739 Hyperglycemia, unspecified: Secondary | ICD-10-CM | POA: Diagnosis not present

## 2017-03-30 DIAGNOSIS — M71012 Abscess of bursa, left shoulder: Secondary | ICD-10-CM | POA: Diagnosis not present

## 2017-03-30 DIAGNOSIS — R739 Hyperglycemia, unspecified: Secondary | ICD-10-CM | POA: Diagnosis not present

## 2017-03-30 DIAGNOSIS — B950 Streptococcus, group A, as the cause of diseases classified elsewhere: Secondary | ICD-10-CM | POA: Diagnosis not present

## 2017-03-30 DIAGNOSIS — I10 Essential (primary) hypertension: Secondary | ICD-10-CM | POA: Diagnosis not present

## 2017-03-30 DIAGNOSIS — R278 Other lack of coordination: Secondary | ICD-10-CM | POA: Diagnosis not present

## 2017-04-01 ENCOUNTER — Ambulatory Visit (INDEPENDENT_AMBULATORY_CARE_PROVIDER_SITE_OTHER): Payer: Medicare Other | Admitting: Physician Assistant

## 2017-04-02 DIAGNOSIS — M71012 Abscess of bursa, left shoulder: Secondary | ICD-10-CM | POA: Diagnosis not present

## 2017-04-02 DIAGNOSIS — R739 Hyperglycemia, unspecified: Secondary | ICD-10-CM | POA: Diagnosis not present

## 2017-04-02 DIAGNOSIS — I10 Essential (primary) hypertension: Secondary | ICD-10-CM | POA: Diagnosis not present

## 2017-04-02 DIAGNOSIS — R278 Other lack of coordination: Secondary | ICD-10-CM | POA: Diagnosis not present

## 2017-04-02 DIAGNOSIS — B950 Streptococcus, group A, as the cause of diseases classified elsewhere: Secondary | ICD-10-CM | POA: Diagnosis not present

## 2017-04-05 ENCOUNTER — Ambulatory Visit (INDEPENDENT_AMBULATORY_CARE_PROVIDER_SITE_OTHER): Payer: Medicare Other | Admitting: Physician Assistant

## 2017-04-05 ENCOUNTER — Encounter (INDEPENDENT_AMBULATORY_CARE_PROVIDER_SITE_OTHER): Payer: Self-pay | Admitting: Physician Assistant

## 2017-04-05 DIAGNOSIS — L02414 Cutaneous abscess of left upper limb: Secondary | ICD-10-CM

## 2017-04-05 NOTE — Progress Notes (Signed)
HPI: [name] returns today follow-up status post I&D left shoulder abscess 02/23/2017.  She is having some increased pain about the shoulder but no fevers chills no drainage.  Just feels tightness from the shoulder down to the forearm.  Is taking tramadol.  She has been on Keflex and applying mupirocin to the incision.  Physical exam:  Left shoulder surgical incisions healing well no signs of infection.  She has 5 out of 5 strength with external and internal rotation against resistance bilateral shoulders.  Slight weakness with empty can test on the left.  Negative impingement testing.  She is able to forward flex actively to approximately 170 degrees.  Passively I can bring her to 180 degrees.  Plan:   Impression: Status post I&D of left shoulder abscess  Plan: We will send her to outpatient therapy to work on range of motion strengthening left shoulder.  She can stop the mupirocin.  She will finish her Keflex.  Follow-up in 1 month sooner if there is any questions or concerns.

## 2017-04-06 DIAGNOSIS — I10 Essential (primary) hypertension: Secondary | ICD-10-CM | POA: Diagnosis not present

## 2017-04-06 DIAGNOSIS — R278 Other lack of coordination: Secondary | ICD-10-CM | POA: Diagnosis not present

## 2017-04-06 DIAGNOSIS — R739 Hyperglycemia, unspecified: Secondary | ICD-10-CM | POA: Diagnosis not present

## 2017-04-06 DIAGNOSIS — M71012 Abscess of bursa, left shoulder: Secondary | ICD-10-CM | POA: Diagnosis not present

## 2017-04-06 DIAGNOSIS — B950 Streptococcus, group A, as the cause of diseases classified elsewhere: Secondary | ICD-10-CM | POA: Diagnosis not present

## 2017-04-12 ENCOUNTER — Ambulatory Visit: Payer: Medicare Other | Admitting: Family Medicine

## 2017-04-12 ENCOUNTER — Ambulatory Visit (INDEPENDENT_AMBULATORY_CARE_PROVIDER_SITE_OTHER): Payer: Medicare Other | Admitting: Physical Therapy

## 2017-04-12 DIAGNOSIS — M25612 Stiffness of left shoulder, not elsewhere classified: Secondary | ICD-10-CM | POA: Diagnosis not present

## 2017-04-12 DIAGNOSIS — M25512 Pain in left shoulder: Secondary | ICD-10-CM

## 2017-04-12 NOTE — Therapy (Signed)
[facility] [address] [place], [name], [address] Phone: [phone]   Fax:  [phone]  Physical Therapy Evaluation  Patient Details  Name: [name] MRN: [id number] Date of Birth: 12-25-1935 Referring Provider: Erskine Emery, PA   Encounter Date: 04/12/2017  PT End of Session - 04/12/17 1507    Visit Number  1    Number of Visits  12    Date for PT Re-Evaluation  05/24/17    Authorization Type  UHC    PT Start Time  2951    PT Stop Time  1502    PT Time Calculation (min)  47 min    Activity Tolerance  Patient tolerated treatment well;Patient limited by pain       Past Medical History:  Diagnosis Date  . Age-related macular degeneration, dry, both eyes   . Anemia   . Anxiety   . Cholelithiasis   . Endometriosis   . Goiter   . Herpes simplex   . Hyperlipidemia   . Hypertension   . Loss of weight   . Nontoxic multinodular goiter   . Other abnormal blood chemistry   . PUD (peptic ulcer disease)   . Renal cyst   . Sciatica   . Scoliosis (and kyphoscoliosis), idiopathic   . Unspecified disorder of kidney and ureter   . Urinary frequency     Past Surgical History:  Procedure Laterality Date  . ABDOMINAL HYSTERECTOMY  1971  . CATARACT EXTRACTION W/ INTRAOCULAR LENS  IMPLANT, BILATERAL Bilateral 2016   Dr. Rutherford Guys  . IRRIGATION AND DEBRIDEMENT ABSCESS Left 02/23/2017   Procedure: IRRIGATION AND DEBRIDEMENT ABSCESS;  Surgeon: Mcarthur Rossetti, MD;  Location: [facility];  Service: Orthopedics;  Laterality: Left;  . REPAIR OF PERFORATED ULCER  1973 &1975   Dr Vida Rigger  . STOMACH SURGERY  [phone]   removed 1/2 stomach; "had a total of 5 ORs from 1973-1980; all related to perforated ulcer"  . TONSILLECTOMY      There were no vitals filed for this visit.   Subjective Assessment - 04/12/17 1416    Subjective  Pt was hospitalized Jan 7-11th, for infection in L shoulder/absess. She had surgery on 1/8 for I&D.  Infection is now cleared up. She has seen home OT, and has been d/c'd. She now states significant pain in L shouluder , and limitations in ROM. She has had some problems with L shoulder, but reports no previous frozen shoulder. She is R handed.     Limitations  Writing;House hold activities    Patient Stated Goals  Decreased pain     Currently in Pain?  Yes    Pain Score  5     Pain Location  Shoulder    Pain Descriptors / Indicators  Tightness;Spasm;Aching    Pain Onset  More than a month ago    Pain Frequency  Intermittent    Aggravating Factors   Elevation of arm, increased use          [facility] PT Assessment - 04/12/17 0001      Assessment   Medical Diagnosis  L shoulder pain: s/p I&D for abcess    Referring Provider  Erskine Emery, PA    Onset Date/Surgical Date  02/23/17    Hand Dominance  Right    Next MD Visit  05/03/17    Prior Therapy  Home OT      Precautions   Precautions  None      Balance Screen  Has the patient fallen in the past 6 months  No      Cognition   Overall Cognitive Status  Within Functional Limits for tasks assessed      ROM / Strength   AROM / PROM / Strength  AROM;PROM;Strength      AROM   AROM Assessment Site  Shoulder    Right/Left Shoulder  Left    Left Shoulder Flexion  90 Degrees    Left Shoulder ABduction  60 Degrees    Left Shoulder Internal Rotation  60 Degrees    Left Shoulder External Rotation  70 Degrees      PROM   PROM Assessment Site  Shoulder    Right/Left Shoulder  Left    Left Shoulder Flexion  110 Degrees    Left Shoulder ABduction  110 Degrees    Left Shoulder Internal Rotation  60 Degrees    Left Shoulder External Rotation  70 Degrees      Strength   Strength Assessment Site  Shoulder    Right/Left Shoulder  Left    Left Shoulder Flexion  3-/5    Left Shoulder ABduction  3-/5    Left Shoulder Internal Rotation  4-/5    Left Shoulder External Rotation  4-/5      Palpation   Palpation comment  Significant  stiffness in l GHJ ;  Incision at L anterior shoulder fully healed.              Objective measurements completed on examination: See above findings.      [facility] Adult PT Treatment/Exercise - 04/12/17 1458      Exercises   Exercises  Shoulder      Shoulder Exercises: Supine   Flexion  15 reps;AAROM    Flexion Limitations  cane      Shoulder Exercises: Standing   Other Standing Exercises  Wall slides x10, 2 UE;      Other Standing Exercises  Pendulums L/R x25              PT Education - 04/12/17 1453    Education provided  Yes    Education Details  HEP    Person(s) Educated  Patient    Methods  Explanation;Demonstration;Tactile cues;Verbal cues    Comprehension  Verbalized understanding       PT Short Term Goals - 04/12/17 1501      PT SHORT TERM GOAL #1   Title  Pt to be independent with initial HEP     Time  2    Period  Weeks    Status  New    Target Date  04/26/17      PT SHORT TERM GOAL #2   Title  Pt to demo increased L shoulder ROM by 10 degrees for flexion     Time  2    Period  Weeks    Status  New    Target Date  04/26/17        PT Long Term Goals - 04/12/17 1505      PT LONG TERM GOAL #1   Title  Pt to demo improved shoulder Flexion and Abduction to be [facility], to improve ability for reaching, and IADLS.     Time  6    Period  Weeks    Target Date  05/24/17      PT LONG TERM GOAL #2   Title  Pt to demo increased strength of L shoulder, to at least 4/5 to improve ability for  reaching, lifting, carrying     Time  6    Period  Weeks    Status  New    Target Date  05/24/17      PT LONG TERM GOAL #3   Title  Pt to demo decreaesd pain to 0-2/10 with L UE activity     Time  6    Period  Weeks    Status  New    Target Date  05/24/17             Plan - 04/12/17 1508    Clinical Impression Statement  Pt presents with primary complaint of increased pain in L shoulder, following I&D for abcess on 02/23/17. She has had recent  increase in pain, and has lack of effective HEP. She has significant joint stiffness in L GHJ, which is limiting PROM and ability for AROM. She has decreased muscle strength as well. She has poor ability for functional use of L UE, with decreased reaching, lifting, carrying, ADLs and IADLs.  Joint stiffness is likely cause of pain. Pt to benefit from skilled PT to improve deficits, and return to PLOF.     Clinical Presentation  Stable    Clinical Decision Making  Low    Rehab Potential  Good    PT Frequency  2x / week    PT Duration  6 weeks    PT Treatment/Interventions  ADLs/Self Care Home Management;Cryotherapy;Electrical Stimulation;Moist Heat;Iontophoresis 4mg /ml Dexamethasone;Therapeutic activities;Ultrasound;Therapeutic exercise;Neuromuscular re-education;Patient/family education;Passive range of motion;Dry needling;Manual techniques;Taping;Vasopneumatic Device    PT Next Visit Plan  AAROM, pulleys        Patient will benefit from skilled therapeutic intervention in order to improve the following deficits and impairments:  Decreased strength, Impaired UE functional use, Pain, Decreased range of motion, Hypomobility  Visit Diagnosis: Acute pain of left shoulder  Stiffness of left shoulder, not elsewhere classified     Problem List Patient Active Problem List   Diagnosis Date Noted  . Streptococcal bacteremia 02/24/2017  . Abscess of left shoulder 02/24/2017  . Anxiety disorder 02/24/2017  . Recovering alcoholic in remission ([place]) 02/24/2017  . Septic bursitis 02/23/2017  . Insomnia 07/01/2015  . Genital herpes simplex type 2 11/03/2014  . Hyperglycemia 05/23/2012  . Multinodular goiter 05/23/2012  . Essential hypertension, benign 05/23/2012  Lyndee Hensen, PT, DPT 5:00 PM  04/12/17    [facility] [place], [name], [address] Phone: [phone]   Fax:  [phone]  Name: ALIANNA WURSTER MRN: [id number] Date of  Birth: 1935/06/06

## 2017-04-13 ENCOUNTER — Ambulatory Visit (INDEPENDENT_AMBULATORY_CARE_PROVIDER_SITE_OTHER): Payer: Medicare Other | Admitting: Nurse Practitioner

## 2017-04-13 ENCOUNTER — Encounter: Payer: Self-pay | Admitting: Nurse Practitioner

## 2017-04-13 VITALS — BP 122/78 | HR 77 | Temp 97.6°F | Ht 64.0 in | Wt 101.6 lb

## 2017-04-13 DIAGNOSIS — R21 Rash and other nonspecific skin eruption: Secondary | ICD-10-CM

## 2017-04-13 DIAGNOSIS — M7110 Other infective bursitis, unspecified site: Secondary | ICD-10-CM | POA: Diagnosis not present

## 2017-04-13 MED ORDER — TRAMADOL HCL 50 MG PO TABS: 50.0000 mg | ORAL_TABLET | Freq: Every evening | ORAL | 0 refills | Status: DC | PRN

## 2017-04-13 NOTE — Progress Notes (Signed)
Careteam: Patient Care Team: Gayland Curry, DO as PCP - General (Geriatric Medicine) Alden Hipp, MD as Consulting Physician (Obstetrics and Gynecology) Rutherford Guys, MD as Consulting Physician (Ophthalmology)  Advanced Directive information    No Known Allergies  Chief Complaint  Patient presents with  . Acute Visit    Complains of Vaginal Rash for 2 weeks.      HPI: Patient is a 82 y.o. female seen in the office today due to spots appearing between legs that she wanted looked at.  Does not particularly bother her but wanted to have it checked out. Has been using barrier cream which has improved rash  Also notes that she has tramadol but rarely using. Since she has been in therapy tylenol does not help as much as it was when she was not moving shoulder. She does not wish to take tramadol unless needed. This was prescribed by ortho.   Review of Systems:  Review of Systems  Constitutional: Negative for chills, fever and malaise/fatigue.  Respiratory: Negative for shortness of breath.   Cardiovascular: Negative for chest pain.  Musculoskeletal:       Pain in left shoulder much better, weakness to left arm, currently undergoing PT  Skin: Positive for rash. Negative for itching.  Neurological: Negative for weakness.    Past Medical History:  Diagnosis Date  . Age-related macular degeneration, dry, both eyes   . Anemia   . Anxiety   . Cholelithiasis   . Endometriosis   . Goiter   . Herpes simplex   . Hyperlipidemia   . Hypertension   . Loss of weight   . Nontoxic multinodular goiter   . Other abnormal blood chemistry   . PUD (peptic ulcer disease)   . Renal cyst   . Sciatica   . Scoliosis (and kyphoscoliosis), idiopathic   . Unspecified disorder of kidney and ureter   . Urinary frequency    Past Surgical History:  Procedure Laterality Date  . ABDOMINAL HYSTERECTOMY  1971  . CATARACT EXTRACTION W/ INTRAOCULAR LENS  IMPLANT, BILATERAL Bilateral 2016   Dr. Rutherford Guys  . IRRIGATION AND DEBRIDEMENT ABSCESS Left 02/23/2017   Procedure: IRRIGATION AND DEBRIDEMENT ABSCESS;  Surgeon: Mcarthur Rossetti, MD;  Location: [facility];  Service: Orthopedics;  Laterality: Left;  . REPAIR OF PERFORATED ULCER  1973 &1975   Dr Vida Rigger  . STOMACH SURGERY  [phone]   removed 1/2 stomach; "had a total of 5 ORs from 1973-1980; all related to perforated ulcer"  . TONSILLECTOMY     Social History:   reports that she quit smoking about 23 years ago. Her smoking use included cigarettes. She has a 40.00 pack-year smoking history. she has never used smokeless tobacco. She reports that she does not drink alcohol or use drugs.  Family History  Problem Relation Age of Onset  . Obesity Mother     Medications: Patient's Medications  New Prescriptions   No medications on file  Previous Medications   ACETAMINOPHEN (TYLENOL) 500 MG TABLET    Take 1,000 mg by mouth every 6 (six) hours as needed for mild pain.   ALPRAZOLAM (XANAX) 0.25 MG TABLET    take 1 tablet by mouth at bedtime if needed   BYSTOLIC 10 MG TABLET    TAKE 1/2 TABLET BY MOUTH ONCE DAILY   EXFORGE 10-320 MG TABLET    take 1 tablet by mouth once daily   TRAMADOL (ULTRAM) 50 MG TABLET    Take 1 tablet (50  mg total) by mouth at bedtime as needed.  Modified Medications   No medications on file  Discontinued Medications   CEPHALEXIN (KEFLEX) 500 MG CAPSULE    Take 1 capsule (500 mg total) by mouth 2 (two) times daily.     Physical Exam:  Vitals:   04/13/17 1303  BP: 122/78  Pulse: 77  Temp: 97.6 F (36.4 C)  TempSrc: Oral  Weight: 101 lb 9.6 oz (46.1 kg)  Height: 5\' 4"  (1.626 m)   Body mass index is 17.44 kg/m.  Physical Exam  Constitutional: She is oriented to person, place, and time. She appears well-developed and well-nourished.  Thin frail female  Cardiovascular: Normal rate, regular rhythm and normal heart sounds.  Pulmonary/Chest: Effort normal and breath sounds normal.    Musculoskeletal: She exhibits no edema.       Right shoulder: She exhibits decreased range of motion. She exhibits no swelling, no effusion, no deformity and no laceration.  Neurological: She is alert and oriented to person, place, and time.  Skin: Skin is warm and dry. Capillary refill takes less than 2 seconds.  Faint red macular rash to bilateral labia majora, no drainage or heat noted.   Psychiatric: She has a normal mood and affect.    Labs reviewed: Basic Metabolic Panel: Recent Labs    07/01/16 0808  02/24/17 0515 02/26/17 0552 03/15/17 1345  NA 138   < > 135 136 135  K 4.6   < > 4.1 4.4 4.8  CL 107   < > 101 103 103  CO2 22   < > 25 25 22   GLUCOSE 100*   < > 200* 94 71  BUN 24   < > 17 22* 24  CREATININE 1.06*   < > 1.04* 0.95 1.00*  CALCIUM 9.4   < > 7.6* 7.6* 8.6  MG  --   --  1.5*  --   --   TSH 1.01  --   --   --   --    < > = values in this interval not displayed.   Liver Function Tests: Recent Labs    07/01/16 0808 02/22/17 2016 03/15/17 1345  AST 18 25 28   ALT 10 21 19   ALKPHOS 89 138*  --   BILITOT 0.4 0.9 0.2  PROT 6.7 7.1 6.6  ALBUMIN 4.1 2.4*  --    No results for input(s): LIPASE, AMYLASE in the last 8760 hours. No results for input(s): AMMONIA in the last 8760 hours. CBC: Recent Labs    02/24/17 0515 02/25/17 0512 02/26/17 0552 03/15/17 1345  WBC 21.7* 22.2* 16.3* 5.2  NEUTROABS 20.9* 19.1*  --  3,021  HGB 9.1* 8.4* 8.5* 8.5*  HCT 29.4* 27.2* 26.6* 26.5*  MCV 82.6 81.9 82.4 81.5  PLT 386 493* 508* 555*   Lipid Panel: Recent Labs    07/01/16 0808  CHOL 200*  HDL 141  LDLCALC 44  TRIG 76  CHOLHDL 1.4   TSH: Recent Labs    07/01/16 0808  TSH 1.01   A1C: Lab Results  Component Value Date   HGBA1C 6.6 (H) 02/25/2017     Assessment/Plan 1. Rash and nonspecific skin eruption Overall improving with cream she is applying at home. No local or systemic symptoms. Will have her use nystatin cream to effected area twice daily  until resolve and to notify if worsen or symptoms occur.  2. Septic bursitis To right shoulder, currently participating in physical therapy which exacerbates pain -using tylenol  for pain but has tramadol PRN unrelieved pain, if needing refill will request from orthopedics   Next appt: 07/01/2017 Janett Billow K. Harle Battiest  [facility] [phone] 8 am - 5 pm) [phone] (after hours)

## 2017-04-13 NOTE — Patient Instructions (Signed)
To use nystatin cream twice daily to effected area Notify if area gets worse or symptoms occur

## 2017-04-15 ENCOUNTER — Encounter: Payer: Self-pay | Admitting: Physical Therapy

## 2017-04-15 ENCOUNTER — Ambulatory Visit (INDEPENDENT_AMBULATORY_CARE_PROVIDER_SITE_OTHER): Payer: Medicare Other | Admitting: Physical Therapy

## 2017-04-15 DIAGNOSIS — M25512 Pain in left shoulder: Secondary | ICD-10-CM

## 2017-04-15 DIAGNOSIS — M25612 Stiffness of left shoulder, not elsewhere classified: Secondary | ICD-10-CM

## 2017-04-15 NOTE — Therapy (Signed)
[facility] [address] [place], [name], [address] Phone: [phone]   Fax:  [phone]  Physical Therapy Treatment  Patient Details  Name: [name] MRN: [id number] Date of Birth: 1935/04/27 Referring Provider: Erskine Emery, PA   Encounter Date: 04/15/2017  PT End of Session - 04/15/17 1225    Visit Number  2    Number of Visits  12    Date for PT Re-Evaluation  05/24/17    Authorization Type  UHC    PT Start Time  4235    PT Stop Time  1310    PT Time Calculation (min)  50 min    Activity Tolerance  Patient tolerated treatment well;Patient limited by pain       Past Medical History:  Diagnosis Date  . Age-related macular degeneration, dry, both eyes   . Anemia   . Anxiety   . Cholelithiasis   . Endometriosis   . Goiter   . Herpes simplex   . Hyperlipidemia   . Hypertension   . Loss of weight   . Nontoxic multinodular goiter   . Other abnormal blood chemistry   . PUD (peptic ulcer disease)   . Renal cyst   . Sciatica   . Scoliosis (and kyphoscoliosis), idiopathic   . Unspecified disorder of kidney and ureter   . Urinary frequency     Past Surgical History:  Procedure Laterality Date  . ABDOMINAL HYSTERECTOMY  1971  . CATARACT EXTRACTION W/ INTRAOCULAR LENS  IMPLANT, BILATERAL Bilateral 2016   Dr. Rutherford Guys  . IRRIGATION AND DEBRIDEMENT ABSCESS Left 02/23/2017   Procedure: IRRIGATION AND DEBRIDEMENT ABSCESS;  Surgeon: Mcarthur Rossetti, MD;  Location: [facility];  Service: Orthopedics;  Laterality: Left;  . REPAIR OF PERFORATED ULCER  1973 &1975   Dr Vida Rigger  . STOMACH SURGERY  [phone]   removed 1/2 stomach; "had a total of 5 ORs from 1973-1980; all related to perforated ulcer"  . TONSILLECTOMY      There were no vitals filed for this visit.  Subjective Assessment - 04/15/17 1224    Subjective  Pt with no new complaints. She states soreness and stiffness in shoulder. She has been doing HEP.      Currently in Pain?  Yes    Pain Score  5     Pain Location  Shoulder    Pain Orientation  Left    Pain Descriptors / Indicators  Tightness;Aching;Spasm    Pain Type  Chronic pain;Acute pain    Pain Onset  More than a month ago    Pain Frequency  Intermittent                      [facility] Adult PT Treatment/Exercise - 04/15/17 0001      Shoulder Exercises: Supine   External Rotation  AAROM;20 reps;Limitations    External Rotation Limitations  cane    Flexion  AAROM;20 reps    Flexion Limitations  cane      Shoulder Exercises: Standing   Row  20 reps;Theraband    Theraband Level (Shoulder Row)  Level 2 (Red)    Other Standing Exercises  Wall slides x10, 2 UE;      Other Standing Exercises  Pendulums L/R x25       Shoulder Exercises: Pulleys   Flexion  3 minutes      Shoulder Exercises: Stretch   Other Shoulder Stretches  Ball rolls on table 2UE for flexion- PBall-  x25      Manual Therapy   Manual Therapy  Joint mobilization;Passive ROM    Joint Mobilization  L shoulder, all motions grade 3    Passive ROM  L shoulder, all motions, subscap release              PT Education - 04/15/17 1225    Education provided  Yes    Education Details  HEP    Person(s) Educated  Patient    Methods  Explanation    Comprehension  Verbalized understanding       PT Short Term Goals - 04/12/17 1501      PT SHORT TERM GOAL #1   Title  Pt to be independent with initial HEP     Time  2    Period  Weeks    Status  New    Target Date  04/26/17      PT SHORT TERM GOAL #2   Title  Pt to demo increased L shoulder ROM by 10 degrees for flexion     Time  2    Period  Weeks    Status  New    Target Date  04/26/17        PT Long Term Goals - 04/12/17 1505      PT LONG TERM GOAL #1   Title  Pt to demo improved shoulder Flexion and Abduction to be [facility], to improve ability for reaching, and IADLS.     Time  6    Period  Weeks    Target Date  05/24/17      PT LONG  TERM GOAL #2   Title  Pt to demo increased strength of L shoulder, to at least 4/5 to improve ability for reaching, lifting, carrying     Time  6    Period  Weeks    Status  New    Target Date  05/24/17      PT LONG TERM GOAL #3   Title  Pt to demo decreaesd pain to 0-2/10 with L UE activity     Time  6    Period  Weeks    Status  New    Target Date  05/24/17            Plan - 04/15/17 1423    Clinical Impression Statement  Ther ex for ROM progressed today. She has mild improvements of ROM in supine today. She does have soreness with increased elevation, and has difficulty relaxing with PROM. Plan to progress AAROM and AROM as able.     Rehab Potential  Good    PT Frequency  2x / week    PT Duration  6 weeks    PT Treatment/Interventions  ADLs/Self Care Home Management;Cryotherapy;Electrical Stimulation;Moist Heat;Iontophoresis 4mg /ml Dexamethasone;Therapeutic activities;Ultrasound;Therapeutic exercise;Neuromuscular re-education;Patient/family education;Passive range of motion;Dry needling;Manual techniques;Taping;Vasopneumatic Device    PT Next Visit Plan  AAROM, pulleys        Patient will benefit from skilled therapeutic intervention in order to improve the following deficits and impairments:  Decreased strength, Impaired UE functional use, Pain, Decreased range of motion, Hypomobility  Visit Diagnosis: Acute pain of left shoulder  Stiffness of left shoulder, not elsewhere classified     Problem List Patient Active Problem List   Diagnosis Date Noted  . Streptococcal bacteremia 02/24/2017  . Abscess of left shoulder 02/24/2017  . Anxiety disorder 02/24/2017  . Recovering alcoholic in remission ([place]) 02/24/2017  . Septic bursitis 02/23/2017  . Insomnia 07/01/2015  .  Genital herpes simplex type 2 11/03/2014  . Hyperglycemia 05/23/2012  . Multinodular goiter 05/23/2012  . Essential hypertension, benign 05/23/2012   Lyndee Hensen, PT, DPT 2:37 PM   04/15/17    [facility] [place], [name], [address] Phone: [phone]   Fax:  [phone]  Name: [name] MRN: [id number] Date of Birth: December 24, 1935

## 2017-04-19 ENCOUNTER — Encounter: Payer: Self-pay | Admitting: Physical Therapy

## 2017-04-19 ENCOUNTER — Other Ambulatory Visit: Payer: Self-pay | Admitting: Internal Medicine

## 2017-04-19 ENCOUNTER — Ambulatory Visit (INDEPENDENT_AMBULATORY_CARE_PROVIDER_SITE_OTHER): Payer: Medicare Other | Admitting: Physical Therapy

## 2017-04-19 DIAGNOSIS — M25512 Pain in left shoulder: Secondary | ICD-10-CM

## 2017-04-19 DIAGNOSIS — M25612 Stiffness of left shoulder, not elsewhere classified: Secondary | ICD-10-CM

## 2017-04-19 NOTE — Therapy (Signed)
[facility] [address] [place], [name], [address] Phone: [phone]   Fax:  [phone]  Physical Therapy Treatment  Patient Details  Name: [name] MRN: [id number] Date of Birth: [birth year] Referring Provider: Erskine Emery, PA   Encounter Date: 04/19/2017  PT End of Session - 04/19/17 1239    Visit Number  3    Number of Visits  12    Date for PT Re-Evaluation  05/24/17    Authorization Type  UHC    PT Start Time  7106    PT Stop Time  1310    PT Time Calculation (min)  35 min    Activity Tolerance  Patient tolerated treatment well;Patient limited by pain       Past Medical History:  Diagnosis Date  . Age-related macular degeneration, dry, both eyes   . Anemia   . Anxiety   . Cholelithiasis   . Endometriosis   . Goiter   . Herpes simplex   . Hyperlipidemia   . Hypertension   . Loss of weight   . Nontoxic multinodular goiter   . Other abnormal blood chemistry   . PUD (peptic ulcer disease)   . Renal cyst   . Sciatica   . Scoliosis (and kyphoscoliosis), idiopathic   . Unspecified disorder of kidney and ureter   . Urinary frequency     Past Surgical History:  Procedure Laterality Date  . ABDOMINAL HYSTERECTOMY  1971  . CATARACT EXTRACTION W/ INTRAOCULAR LENS  IMPLANT, BILATERAL Bilateral 2016   Dr. Rutherford Guys  . IRRIGATION AND DEBRIDEMENT ABSCESS Left 02/23/2017   Procedure: IRRIGATION AND DEBRIDEMENT ABSCESS;  Surgeon: Mcarthur Rossetti, MD;  Location: [facility];  Service: Orthopedics;  Laterality: Left;  . REPAIR OF PERFORATED ULCER  1973 &1975   Dr Vida Rigger  . STOMACH SURGERY  [phone]   removed 1/2 stomach; "had a total of 5 ORs from 1973-1980; all related to perforated ulcer"  . TONSILLECTOMY      There were no vitals filed for this visit.  Subjective Assessment - 04/19/17 1232    Subjective  Pt states continued, significant pain in L shoulder, and is frustrated with pain level. She also has  increased redness in L eye today, states she just noticed it . Appears to be blood vessel, recommended pt follow up with eye Dr. today, pt states understanding. Denies pain, or difficulty with vision or other symptoms.     Limitations  Lifting;House hold activities    Patient Stated Goals  Decreased pain     Currently in Pain?  Yes    Pain Score  5     Pain Location  Shoulder    Pain Orientation  Left    Pain Descriptors / Indicators  Tightness;Aching    Pain Type  Acute pain    Pain Onset  More than a month ago    Pain Frequency  Intermittent    Aggravating Factors   Elevation of arm, activity , sleep          [facility] PT Assessment - 04/19/17 0001      AROM   Left Shoulder Flexion  120 Degrees      PROM   Left Shoulder Flexion  128 Degrees                  [facility] Adult PT Treatment/Exercise - 04/19/17 1239      Shoulder Exercises: Supine   Flexion  AAROM;20 reps  Flexion Limitations  cane      Shoulder Exercises: Standing   Row  --    Theraband Level (Shoulder Row)  --    Other Standing Exercises  --    Other Standing Exercises  --      Shoulder Exercises: Pulleys   Flexion  3 minutes      Shoulder Exercises: Stretch   Other Shoulder Stretches  Ball rolls on table 2UE for flexion- PBall- x25      Modalities   Modalities  Teacher, English as a foreign language Location  L shoulder    Electrical Stimulation Action  PreMod    Electrical Stimulation Parameters  2 pads     Electrical Stimulation Goals  Pain      Manual Therapy   Manual Therapy  Joint mobilization;Passive ROM    Joint Mobilization  L shoulder, all motions grade 3    Passive ROM  L shoulder, all motions, subscap release              PT Education - 04/19/17 1238    Education provided  Yes    Education Details  HEP,     Person(s) Educated  Patient    Methods  Explanation    Comprehension  Verbalized understanding       PT Short Term Goals  - 04/12/17 1501      PT SHORT TERM GOAL #1   Title  Pt to be independent with initial HEP     Time  2    Period  Weeks    Status  New    Target Date  04/26/17      PT SHORT TERM GOAL #2   Title  Pt to demo increased L shoulder ROM by 10 degrees for flexion     Time  2    Period  Weeks    Status  New    Target Date  04/26/17        PT Long Term Goals - 04/12/17 1505      PT LONG TERM GOAL #1   Title  Pt to demo improved shoulder Flexion and Abduction to be [facility], to improve ability for reaching, and IADLS.     Time  6    Period  Weeks    Target Date  05/24/17      PT LONG TERM GOAL #2   Title  Pt to demo increased strength of L shoulder, to at least 4/5 to improve ability for reaching, lifting, carrying     Time  6    Period  Weeks    Status  New    Target Date  05/24/17      PT LONG TERM GOAL #3   Title  Pt to demo decreaesd pain to 0-2/10 with L UE activity     Time  6    Period  Weeks    Status  New    Target Date  05/24/17            Plan - 04/19/17 2102    Clinical Impression Statement  Pt with improving shoulder PROM and AROM per measurements today. She continues to have pain in shoulder which she is very concerned with, discussed expectation of pain with adhesive capsulitis. Decreased ther ex done today, due to pt being very anxious about eye today, and wanted to make phone call for dr. appt during session. E-stim added for pain today.     Rehab  Potential  Good    PT Frequency  2x / week    PT Duration  6 weeks    PT Treatment/Interventions  ADLs/Self Care Home Management;Cryotherapy;Electrical Stimulation;Moist Heat;Iontophoresis 4mg /ml Dexamethasone;Therapeutic activities;Ultrasound;Therapeutic exercise;Neuromuscular re-education;Patient/family education;Passive range of motion;Dry needling;Manual techniques;Taping;Vasopneumatic Device    PT Next Visit Plan  AAROM, pulleys        Patient will benefit from skilled therapeutic intervention in order to  improve the following deficits and impairments:  Decreased strength, Impaired UE functional use, Pain, Decreased range of motion, Hypomobility  Visit Diagnosis: Acute pain of left shoulder  Stiffness of left shoulder, not elsewhere classified     Problem List Patient Active Problem List   Diagnosis Date Noted  . Streptococcal bacteremia 02/24/2017  . Abscess of left shoulder 02/24/2017  . Anxiety disorder 02/24/2017  . Recovering alcoholic in remission ([place]) 02/24/2017  . Septic bursitis 02/23/2017  . Insomnia 07/01/2015  . Genital herpes simplex type 2 11/03/2014  . Hyperglycemia 05/23/2012  . Multinodular goiter 05/23/2012  . Essential hypertension, benign 05/23/2012   Lyndee Hensen, PT, DPT 9:10 PM  04/19/17    [facility] [place], [name], [address] Phone: [phone]   Fax:  [phone]  Name: [name] MRN: [id number] Date of Birth: Mar 22, 1935

## 2017-04-20 NOTE — Telephone Encounter (Signed)
A medication refill was received from pharmacy for alprazolam 0.25 mg. Rx was called in to pharmacy after verifying last fill date, provider, and quantity on PMP [place] database.   

## 2017-04-29 ENCOUNTER — Encounter: Payer: Self-pay | Admitting: Physical Therapy

## 2017-04-29 ENCOUNTER — Ambulatory Visit (INDEPENDENT_AMBULATORY_CARE_PROVIDER_SITE_OTHER): Payer: Medicare Other | Admitting: Physical Therapy

## 2017-04-29 DIAGNOSIS — M25512 Pain in left shoulder: Secondary | ICD-10-CM

## 2017-04-29 DIAGNOSIS — M25612 Stiffness of left shoulder, not elsewhere classified: Secondary | ICD-10-CM | POA: Diagnosis not present

## 2017-04-29 NOTE — Therapy (Signed)
[facility] [address] [place], [name], [address] Phone: [phone]   Fax:  [phone]  Physical Therapy Treatment  Patient Details  Name: [name] MRN: [id number] Date of Birth: 1936/01/19 Referring Provider: Erskine Emery, PA   Encounter Date: 04/29/2017  PT End of Session - 04/29/17 1227    Visit Number  4    Number of Visits  12    Date for PT Re-Evaluation  05/24/17    Authorization Type  UHC    PT Start Time  1216    PT Stop Time  1308    PT Time Calculation (min)  52 min    Activity Tolerance  Patient tolerated treatment well;Patient limited by pain       Past Medical History:  Diagnosis Date  . Age-related macular degeneration, dry, both eyes   . Anemia   . Anxiety   . Cholelithiasis   . Endometriosis   . Goiter   . Herpes simplex   . Hyperlipidemia   . Hypertension   . Loss of weight   . Nontoxic multinodular goiter   . Other abnormal blood chemistry   . PUD (peptic ulcer disease)   . Renal cyst   . Sciatica   . Scoliosis (and kyphoscoliosis), idiopathic   . Unspecified disorder of kidney and ureter   . Urinary frequency     Past Surgical History:  Procedure Laterality Date  . ABDOMINAL HYSTERECTOMY  1971  . CATARACT EXTRACTION W/ INTRAOCULAR LENS  IMPLANT, BILATERAL Bilateral 2016   Dr. Rutherford Guys  . IRRIGATION AND DEBRIDEMENT ABSCESS Left 02/23/2017   Procedure: IRRIGATION AND DEBRIDEMENT ABSCESS;  Surgeon: Mcarthur Rossetti, MD;  Location: [facility];  Service: Orthopedics;  Laterality: Left;  . REPAIR OF PERFORATED ULCER  1973 &1975   Dr Vida Rigger  . STOMACH SURGERY  [phone]   removed 1/2 stomach; "had a total of 5 ORs from 1973-1980; all related to perforated ulcer"  . TONSILLECTOMY      There were no vitals filed for this visit.  Subjective Assessment - 04/29/17 1225    Subjective  Pt states decreasing pain in shoulder overall. She states increased pain wtih certain activities. She is  not taking any OTC meds for this anymore.     Limitations  Lifting;House hold activities    Patient Stated Goals  Decreased pain     Currently in Pain?  Yes    Pain Score  4     Pain Location  Shoulder    Pain Orientation  Left    Pain Descriptors / Indicators  Tightness;Aching    Pain Type  Acute pain    Pain Onset  More than a month ago    Pain Frequency  Intermittent         [facility] PT Assessment - 04/29/17 0001      AROM   Left Shoulder Flexion  --    Left Shoulder ABduction  --      PROM   Left Shoulder Flexion  150 Degrees    Left Shoulder ABduction  135 Degrees                  [facility] Adult PT Treatment/Exercise - 04/29/17 0001      Shoulder Exercises: Supine   Flexion  AAROM;20 reps    Flexion Limitations  cane    Other Supine Exercises  Supine flexion AROM x20;     Other Supine Exercises  Horizontal Abd x15  Shoulder Exercises: Standing   Flexion  15 reps    Row  20 reps;Theraband    Theraband Level (Shoulder Row)  Level 2 (Red)    Other Standing Exercises  Wall slides 2x10, 2 UE;      Other Standing Exercises  AAROM with UE ranger , flexion x15      Shoulder Exercises: Pulleys   Flexion  3 minutes      Modalities   Modalities  Electrical Stimulation;Moist Heat      Moist Heat Therapy   Number Minutes Moist Heat  12 Minutes    Moist Heat Location  Shoulder      Electrical Stimulation   Electrical Stimulation Location  L shoulder    Electrical Stimulation Action  Pre Mod    Electrical Stimulation Parameters  2 pads, x12 min    Electrical Stimulation Goals  Pain      Manual Therapy   Joint Mobilization  L shoulder, all motions grade 3    Passive ROM  L shoulder, all motions, subscap release              PT Education - 04/29/17 1226    Education provided  Yes    Education Details  HEP    Person(s) Educated  Patient    Methods  Explanation    Comprehension  Verbalized understanding;Need further instruction;Verbal cues required        PT Short Term Goals - 04/12/17 1501      PT SHORT TERM GOAL #1   Title  Pt to be independent with initial HEP     Time  2    Period  Weeks    Status  New    Target Date  04/26/17      PT SHORT TERM GOAL #2   Title  Pt to demo increased L shoulder ROM by 10 degrees for flexion     Time  2    Period  Weeks    Status  New    Target Date  04/26/17        PT Long Term Goals - 04/12/17 1505      PT LONG TERM GOAL #1   Title  Pt to demo improved shoulder Flexion and Abduction to be [facility], to improve ability for reaching, and IADLS.     Time  6    Period  Weeks    Target Date  05/24/17      PT LONG TERM GOAL #2   Title  Pt to demo increased strength of L shoulder, to at least 4/5 to improve ability for reaching, lifting, carrying     Time  6    Period  Weeks    Status  New    Target Date  05/24/17      PT LONG TERM GOAL #3   Title  Pt to demo decreaesd pain to 0-2/10 with L UE activity     Time  6    Period  Weeks    Status  New    Target Date  05/24/17            Plan - 04/29/17 1301    Clinical Impression Statement  Pt showing improvements with PROM, AROM, and pain. She continues to have stiffness at end range for flexion and abduction, but is improving each visit. Pt educated on need to continue AAROM and AROM daily. E-stim continued for pain, pt with + response last visit.     Rehab Potential  Good  PT Frequency  2x / week    PT Duration  6 weeks    PT Treatment/Interventions  ADLs/Self Care Home Management;Cryotherapy;Electrical Stimulation;Moist Heat;Iontophoresis 4mg /ml Dexamethasone;Therapeutic activities;Ultrasound;Therapeutic exercise;Neuromuscular re-education;Patient/family education;Passive range of motion;Dry needling;Manual techniques;Taping;Vasopneumatic Device    PT Next Visit Plan  AAROM, pulleys        Patient will benefit from skilled therapeutic intervention in order to improve the following deficits and impairments:  Decreased  strength, Impaired UE functional use, Pain, Decreased range of motion, Hypomobility  Visit Diagnosis: Acute pain of left shoulder  Stiffness of left shoulder, not elsewhere classified     Problem List Patient Active Problem List   Diagnosis Date Noted  . Streptococcal bacteremia 02/24/2017  . Abscess of left shoulder 02/24/2017  . Anxiety disorder 02/24/2017  . Recovering alcoholic in remission ([place]) 02/24/2017  . Septic bursitis 02/23/2017  . Insomnia 07/01/2015  . Genital herpes simplex type 2 11/03/2014  . Hyperglycemia 05/23/2012  . Multinodular goiter 05/23/2012  . Essential hypertension, benign 05/23/2012   Lyndee Hensen, PT, DPT 1:05 PM  04/29/17    [facility] [place], [name], [address] Phone: [phone]   Fax:  [phone]  Name: [name] MRN: [id number] Date of Birth: [birth year]

## 2017-05-03 ENCOUNTER — Ambulatory Visit (INDEPENDENT_AMBULATORY_CARE_PROVIDER_SITE_OTHER): Payer: Medicare Other | Admitting: Physician Assistant

## 2017-05-03 ENCOUNTER — Encounter (INDEPENDENT_AMBULATORY_CARE_PROVIDER_SITE_OTHER): Payer: Self-pay | Admitting: Physician Assistant

## 2017-05-03 DIAGNOSIS — L02414 Cutaneous abscess of left upper limb: Secondary | ICD-10-CM

## 2017-05-03 MED ORDER — DICLOFENAC SODIUM 1 % TD GEL: 2.0000 g | Freq: Four times a day (QID) | TRANSDERMAL | 1 refills | Status: DC

## 2017-05-03 NOTE — Progress Notes (Signed)
HPI: Mrs. returns today follow-up ofSur left shoulder irrigation and debridement of an abscess.  She states overall she is doing well.  She is doing physical therapy and feels this is helping with her range of motion.  She continues to have pain in the shoulder particularly at night keeps her from sleeping.  She denies any radicular symptoms down the arm.  She had no fevers chills.  Physical exam: Left shoulder surgical incisions healed well no signs of infection.  She has forward flexion to approximately 165 degrees.  Passively and bring it about 175 degrees.  5 out of 5 strength with external and internal rotation against resistance.  Empty can test negative bilaterally.  Impression: Status post irrigation debridement left shoulder abscess 02/23/2017  Plan she will continue work with therapy for range of motion of the shoulder.  She did ask for some type of cream to apply to the shoulder she is unable to take NSAIDs due to history of ulcers she believes this is gastric ulcer.  Therefore we will prescribe Voltaren gel which she can apply up to 2 g 4 times daily to the shoulder see if this helps.  Like to see her back in 3 months to check her progress lack of.  She will continue to work with therapy and transition to home exercise program.

## 2017-05-04 ENCOUNTER — Ambulatory Visit (INDEPENDENT_AMBULATORY_CARE_PROVIDER_SITE_OTHER): Payer: Medicare Other | Admitting: Physical Therapy

## 2017-05-04 ENCOUNTER — Encounter: Payer: Self-pay | Admitting: Physical Therapy

## 2017-05-04 DIAGNOSIS — M25512 Pain in left shoulder: Secondary | ICD-10-CM | POA: Diagnosis not present

## 2017-05-04 DIAGNOSIS — M25612 Stiffness of left shoulder, not elsewhere classified: Secondary | ICD-10-CM | POA: Diagnosis not present

## 2017-05-04 NOTE — Therapy (Signed)
[name] [name], [name], [address] Phone: [phone]   Fax:  [phone]  Physical Therapy Treatment  Patient Details  Name: [name] MRN: [id number] Date of Birth: 02-21-1935 Referring Provider: Erskine Emery, PA   Encounter Date: 05/04/2017  PT End of Session - 05/04/17 1103    Visit Number  5    Number of Visits  12    Date for PT Re-Evaluation  05/24/17    Authorization Type  UHC    PT Start Time  1050    PT Stop Time  1137    PT Time Calculation (min)  47 min    Activity Tolerance  Patient tolerated treatment well;Patient limited by pain       Past Medical History:  Diagnosis Date  . Age-related macular degeneration, dry, both eyes   . Anemia   . Anxiety   . Cholelithiasis   . Endometriosis   . Goiter   . Herpes simplex   . Hyperlipidemia   . Hypertension   . Loss of weight   . Nontoxic multinodular goiter   . Other abnormal blood chemistry   . PUD (peptic ulcer disease)   . Renal cyst   . Sciatica   . Scoliosis (and kyphoscoliosis), idiopathic   . Unspecified disorder of kidney and ureter   . Urinary frequency     Past Surgical History:  Procedure Laterality Date  . ABDOMINAL HYSTERECTOMY  1971  . CATARACT EXTRACTION W/ INTRAOCULAR LENS  IMPLANT, BILATERAL Bilateral 2016   Dr. Rutherford [name]  . IRRIGATION AND DEBRIDEMENT ABSCESS Left 02/23/2017   Procedure: IRRIGATION AND DEBRIDEMENT ABSCESS;  Surgeon: Kerri Rossetti, MD;  Location: [facility];  Service: Orthopedics;  Laterality: Left;  . REPAIR OF PERFORATED ULCER  1973 &1975   Dr [name]  . STOMACH SURGERY  [phone]   removed 1/2 stomach; "had a total of 5 ORs from 1973-1980; all related to perforated ulcer"  . TONSILLECTOMY      There were no vitals filed for this visit.  Subjective Assessment - 05/04/17 1101    Subjective  Pt states decreased pain last night, but pain at night has been variable. She saw MD this week, with good  report.     Currently in Pain?  Yes    Pain Score  4     Pain Location  Shoulder    Pain Orientation  Left    Pain Descriptors / Indicators  Tightness;Aching    Pain Type  Acute pain    Pain Onset  More than a month ago    Pain Frequency  Intermittent    Aggravating Factors   Sleeping                      [facility] Adult PT Treatment/Exercise - 05/04/17 1110      Shoulder Exercises: Supine   Flexion  AAROM;20 reps    Flexion Limitations  cane    Other Supine Exercises  Supine flexion AROM x20;     Other Supine Exercises  Horizontal Abd x20 , 1 lb;  Chest Press 2lb x20;      Shoulder Exercises: Sidelying   External Rotation  20 reps;Weights    External Rotation Weight (lbs)  1    ABduction  15 reps      Shoulder Exercises: Standing   Flexion  15 reps    Row  20 reps;Theraband    Theraband Level (Shoulder Row)  Level  2 (Red)    Other Standing Exercises  Wall slides 2x10, 2 UE;  IR behind back x10    Other Standing Exercises  AAROM with UE ranger , flexion x20, Abd x10      Shoulder Exercises: Pulleys   Flexion  3 minutes      Modalities   Modalities  --      Moist Heat Therapy   Moist Heat Location  --      Electrical Stimulation   Electrical Stimulation Location  --    Electrical Stimulation Goals  --      Manual Therapy   Joint Mobilization  L shoulder, all motions grade 3, scapular mobs    Passive ROM  L shoulder, all motions, subscap release              PT Education - 05/04/17 1102    Education provided  Yes    Education Details  HEP    Person(s) Educated  Patient    Methods  Explanation    Comprehension  Verbalized understanding       PT Short Term Goals - 05/04/17 1103      PT SHORT TERM GOAL #1   Title  Pt to be independent with initial HEP     Time  2    Period  Weeks    Status  Achieved      PT SHORT TERM GOAL #2   Title  Pt to demo increased L shoulder ROM by 10 degrees for flexion     Time  2    Period  Weeks    Status   Achieved        PT Long Term Goals - 04/12/17 1505      PT LONG TERM GOAL #1   Title  Pt to demo improved shoulder Flexion and Abduction to be [facility], to improve ability for reaching, and IADLS.     Time  6    Period  Weeks    Target Date  05/24/17      PT LONG TERM GOAL #2   Title  Pt to demo increased strength of L shoulder, to at least 4/5 to improve ability for reaching, lifting, carrying     Time  6    Period  Weeks    Status  New    Target Date  05/24/17      PT LONG TERM GOAL #3   Title  Pt to demo decreaesd pain to 0-2/10 with L UE activity     Time  6    Period  Weeks    Status  New    Target Date  05/24/17            Plan - 05/04/17 1145    Clinical Impression Statement  Pt with improving ROM. She has improved ability for AROM for elevation. She continues to have limitations at end ranges of all motions. Increased pain with PROM at end ranges of all motions as well.     Rehab Potential  Good    PT Frequency  2x / week    PT Duration  6 weeks    PT Treatment/Interventions  ADLs/Self Care Home Management;Cryotherapy;Electrical Stimulation;Moist Heat;Iontophoresis 4mg /ml Dexamethasone;Therapeutic activities;Ultrasound;Therapeutic exercise;Neuromuscular re-education;Patient/family education;Passive range of motion;Dry needling;Manual techniques;Taping;Vasopneumatic Device    PT Next Visit Plan  AAROM, pulleys        Patient will benefit from skilled therapeutic intervention in order to improve the following deficits and impairments:  Decreased strength, Impaired UE  functional use, Pain, Decreased range of motion, Hypomobility  Visit Diagnosis: Acute pain of left shoulder  Stiffness of left shoulder, not elsewhere classified     Problem List Patient Active Problem List   Diagnosis Date Noted  . Streptococcal bacteremia 02/24/2017  . Abscess of left shoulder 02/24/2017  . Anxiety disorder 02/24/2017  . Recovering alcoholic in remission ([place]) 02/24/2017   . Septic bursitis 02/23/2017  . Insomnia 07/01/2015  . Genital herpes simplex type 2 11/03/2014  . Hyperglycemia 05/23/2012  . Multinodular goiter 05/23/2012  . Essential hypertension, benign 05/23/2012   [name], PT, DPT 11:49 AM  05/04/17    [facility] [place], [name], [address] Phone: [phone]   Fax:  [phone]  Name: [name] MRN: [id number] Date of Birth: [birth year]

## 2017-05-06 ENCOUNTER — Ambulatory Visit (INDEPENDENT_AMBULATORY_CARE_PROVIDER_SITE_OTHER): Payer: Medicare Other | Admitting: Physical Therapy

## 2017-05-06 ENCOUNTER — Encounter: Payer: Self-pay | Admitting: Physical Therapy

## 2017-05-06 DIAGNOSIS — M25512 Pain in left shoulder: Secondary | ICD-10-CM | POA: Diagnosis not present

## 2017-05-06 DIAGNOSIS — M25612 Stiffness of left shoulder, not elsewhere classified: Secondary | ICD-10-CM | POA: Diagnosis not present

## 2017-05-06 NOTE — Therapy (Addendum)
[facility] [address] [place], [name], [address] Phone: [phone]   Fax:  [phone]  Physical Therapy Treatment  Patient Details  Name: [name] MRN: [id number] Date of Birth: Apr 06, 1935 Referring Provider: Erskine Emery, PA   Encounter Date: 05/06/2017  PT End of Session - 05/06/17 1110    Visit Number  6    Number of Visits  12    Date for PT Re-Evaluation  05/24/17    Authorization Type  [employer]    PT Start Time  1106    PT Stop Time  1154    PT Time Calculation (min)  48 min    Activity Tolerance  Patient tolerated treatment well;Patient limited by pain       Past Medical History:  Diagnosis Date  . Age-related macular degeneration, dry, both eyes   . Anemia   . Anxiety   . Cholelithiasis   . Endometriosis   . Goiter   . Herpes simplex   . Hyperlipidemia   . Hypertension   . Loss of weight   . Nontoxic multinodular goiter   . Other abnormal blood chemistry   . PUD (peptic ulcer disease)   . Renal cyst   . Sciatica   . Scoliosis (and kyphoscoliosis), idiopathic   . Unspecified disorder of kidney and ureter   . Urinary frequency     Past Surgical History:  Procedure Laterality Date  . ABDOMINAL HYSTERECTOMY  1971  . CATARACT EXTRACTION W/ INTRAOCULAR LENS  IMPLANT, BILATERAL Bilateral 2016   Dr. Rutherford Guys  . IRRIGATION AND DEBRIDEMENT ABSCESS Left 02/23/2017   Procedure: IRRIGATION AND DEBRIDEMENT ABSCESS;  Surgeon: Mcarthur Rossetti, MD;  Location: [facility];  Service: Orthopedics;  Laterality: Left;  . REPAIR OF PERFORATED ULCER  1973 &1975   Dr Vida Rigger  . STOMACH SURGERY  [phone]   removed 1/2 stomach; "had a total of 5 ORs from 1973-1980; all related to perforated ulcer"  . TONSILLECTOMY      There were no vitals filed for this visit.  Subjective Assessment - 05/06/17 1109    Subjective  Pt states that she is having more good nights, sleeping better with less pain.    Currently in Pain?  Yes     Pain Score  2     Pain Location  Shoulder    Pain Orientation  Left    Pain Descriptors / Indicators  Tightness;Aching    Pain Onset  More than a month ago    Pain Frequency  Intermittent                      [facility] Adult PT Treatment/Exercise - 05/06/17 1111      Shoulder Exercises: Supine   External Rotation  AAROM;20 reps;Limitations    External Rotation Limitations  at 90/90    Flexion  AAROM;20 reps    Flexion Limitations  cane 2 lb    Other Supine Exercises  --    Other Supine Exercises  Horizontal Abd x20 , 1 lb;  Chest Press 2lb x20;      Shoulder Exercises: Sidelying   External Rotation  --    External Rotation Weight (lbs)  --    ABduction  --      Shoulder Exercises: Standing   Flexion  15 reps    ABduction  15 reps    Row  20 reps;Theraband    Theraband Level (Shoulder Row)  Level 2 (Red)  Other Standing Exercises  Wall slides 2x10, 2 UE;  IR stretch behind back x10    Other Standing Exercises  AAROM with UE ranger , flexion x20, Abd x10; PBall rolls for ABD x20      Shoulder Exercises: Pulleys   Flexion  3 minutes      Manual Therapy   Joint Mobilization  L shoulder, all motions grade 3, scapular mobs    Passive ROM  L shoulder, all motions, subscap release              PT Education - 05/06/17 1110    Education provided  Yes    Education Details  HEP    Person(s) Educated  Patient    Methods  Explanation;Demonstration;Verbal cues;Tactile cues    Comprehension  Verbalized understanding;Need further instruction       PT Short Term Goals - 05/04/17 1103      PT SHORT TERM GOAL #1   Title  Pt to be independent with initial HEP     Time  2    Period  Weeks    Status  Achieved      PT SHORT TERM GOAL #2   Title  Pt to demo increased L shoulder ROM by 10 degrees for flexion     Time  2    Period  Weeks    Status  Achieved        PT Long Term Goals - 04/12/17 1505      PT LONG TERM GOAL #1   Title  Pt to demo improved  shoulder Flexion and Abduction to be [facility], to improve ability for reaching, and IADLS.     Time  6    Period  Weeks    Target Date  05/24/17      PT LONG TERM GOAL #2   Title  Pt to demo increased strength of L shoulder, to at least 4/5 to improve ability for reaching, lifting, carrying     Time  6    Period  Weeks    Status  New    Target Date  05/24/17      PT LONG TERM GOAL #3   Title  Pt to demo decreaesd pain to 0-2/10 with L UE activity     Time  6    Period  Weeks    Status  New    Target Date  05/24/17            Plan - 05/06/17 1220    Clinical Impression Statement  Pt improving with ROM, strength, and pain, however continues to be limited with joint mobility and stiffness. Most limitation is for Abd and end ranges of flexion and ER. Pt educated on IR/ER mobility for HEP today. Will benefit from continued care.     Rehab Potential  Good    PT Frequency  2x / week    PT Duration  6 weeks    PT Treatment/Interventions  ADLs/Self Care Home Management;Cryotherapy;Electrical Stimulation;Moist Heat;Iontophoresis 19m/ml Dexamethasone;Therapeutic activities;Ultrasound;Therapeutic exercise;Neuromuscular re-education;Patient/family education;Passive range of motion;Dry needling;Manual techniques;Taping;Vasopneumatic Device    PT Next Visit Plan  AAROM, pulleys        Patient will benefit from skilled therapeutic intervention in order to improve the following deficits and impairments:  Decreased strength, Impaired UE functional use, Pain, Decreased range of motion, Hypomobility  Visit Diagnosis: Acute pain of left shoulder  Stiffness of left shoulder, not elsewhere classified     Problem List Patient Active Problem List  Diagnosis Date Noted  . Streptococcal bacteremia 02/24/2017  . Abscess of left shoulder 02/24/2017  . Anxiety disorder 02/24/2017  . Recovering alcoholic in remission ([place]) 02/24/2017  . Septic bursitis 02/23/2017  . Insomnia 07/01/2015  . Genital  herpes simplex type 2 11/03/2014  . Hyperglycemia 05/23/2012  . Multinodular goiter 05/23/2012  . Essential hypertension, benign 05/23/2012   Lyndee Hensen, PT, DPT 12:22 PM  05/06/17    [facility] [place], [name], [address] Phone: [phone]   Fax:  [phone]  Name: HOLLYANNE SCHLOESSER MRN: [id number] Date of Birth: Mar 21, 1935   PHYSICAL THERAPY DISCHARGE SUMMARY  Visits from Start of Care: 6 Pt did not return after 3/21/ visit   Plan: Patient agrees to discharge.  Patient goals were partially met. Patient is being discharged due to not returning since the last visit.  ?????      Lyndee Hensen, PT, DPT 12:38 PM  12/14/17

## 2017-05-06 NOTE — Patient Instructions (Signed)
IR behind the back with Strap 30 sec x5 ER AAROM with cane, supine x10

## 2017-05-20 ENCOUNTER — Other Ambulatory Visit: Payer: Self-pay | Admitting: Internal Medicine

## 2017-05-27 ENCOUNTER — Telehealth: Payer: Self-pay | Admitting: *Deleted

## 2017-05-27 NOTE — Telephone Encounter (Signed)
Calling pt to see if she's ok with generic Exforge 10/320 mg tablet received fax stating that    "this prescription is available in a cost saving generic alternative. We are requesting authorization to rewrite this prescription as the generic. We welcome the opportunity to provide information that can lower the costs of prescription and health care for your patient"  .left message to have patient return my call.

## 2017-05-31 NOTE — Telephone Encounter (Signed)
.  left message to have patient return my call.  

## 2017-06-21 ENCOUNTER — Other Ambulatory Visit: Payer: Self-pay | Admitting: Internal Medicine

## 2017-06-21 NOTE — Telephone Encounter (Signed)
A medication refill was received from pharmacy for alprazolam 0.25 mg. Rx was called in to pharmacy after verifying last fill date, provider, and quantity on PMP [place] database.   

## 2017-07-01 ENCOUNTER — Other Ambulatory Visit: Payer: Medicare Other

## 2017-07-01 DIAGNOSIS — I10 Essential (primary) hypertension: Secondary | ICD-10-CM

## 2017-07-01 DIAGNOSIS — R739 Hyperglycemia, unspecified: Secondary | ICD-10-CM

## 2017-07-01 DIAGNOSIS — E785 Hyperlipidemia, unspecified: Secondary | ICD-10-CM

## 2017-07-01 DIAGNOSIS — E042 Nontoxic multinodular goiter: Secondary | ICD-10-CM

## 2017-07-02 LAB — CBC WITH DIFFERENTIAL/PLATELET
Basophils Absolute: 100 cells/uL (ref 0–200)
Basophils Relative: 1.3 %
Eosinophils Absolute: 162 cells/uL (ref 15–500)
Eosinophils Relative: 2.1 %
HCT: 30.8 % — ABNORMAL LOW (ref 35.0–45.0)
Hemoglobin: 9.9 g/dL — ABNORMAL LOW (ref 11.7–15.5)
Lymphs Abs: 3334 cells/uL (ref 850–3900)
MCH: 24.2 pg — ABNORMAL LOW (ref 27.0–33.0)
MCHC: 32.1 g/dL (ref 32.0–36.0)
MCV: 75.3 fL — ABNORMAL LOW (ref 80.0–100.0)
MPV: 10.2 fL (ref 7.5–12.5)
Monocytes Relative: 7.8 %
Neutro Abs: 3504 cells/uL (ref 1500–7800)
Neutrophils Relative %: 45.5 %
Platelets: 346 10*3/uL (ref 140–400)
RBC: 4.09 10*6/uL (ref 3.80–5.10)
RDW: 15.6 % — ABNORMAL HIGH (ref 11.0–15.0)
Total Lymphocyte: 43.3 %
WBC mixed population: 601 cells/uL (ref 200–950)
WBC: 7.7 10*3/uL (ref 3.8–10.8)

## 2017-07-02 LAB — LIPID PANEL
Cholesterol: 216 mg/dL — ABNORMAL HIGH (ref <200)
HDL: 132 mg/dL (ref >50)
LDL Cholesterol (Calc): 67 mg/dL (calc)
Non-HDL Cholesterol (Calc): 84 mg/dL (calc) (ref <130)
Total CHOL/HDL Ratio: 1.6 (calc) (ref <5.0)
Triglycerides: 90 mg/dL (ref <150)

## 2017-07-02 LAB — COMPLETE METABOLIC PANEL WITH GFR
AG Ratio: 1.7 (calc) (ref 1.0–2.5)
ALT: 14 U/L (ref 6–29)
AST: 21 U/L (ref 10–35)
Albumin: 4.3 g/dL (ref 3.6–5.1)
Alkaline phosphatase (APISO): 82 U/L (ref 33–130)
BUN/Creatinine Ratio: 18 (calc) (ref 6–22)
BUN: 21 mg/dL (ref 7–25)
CO2: 25 mmol/L (ref 20–32)
Calcium: 9.8 mg/dL (ref 8.6–10.4)
Chloride: 106 mmol/L (ref 98–110)
Creat: 1.17 mg/dL — ABNORMAL HIGH (ref 0.60–0.93)
GFR, Est African American: 51 mL/min/{1.73_m2} — ABNORMAL LOW (ref >=60)
GFR, Est Non African American: 44 mL/min/{1.73_m2} — ABNORMAL LOW (ref >=60)
Globulin: 2.6 g/dL (calc) (ref 1.9–3.7)
Glucose, Bld: 112 mg/dL — ABNORMAL HIGH (ref 65–99)
Potassium: 4.9 mmol/L (ref 3.5–5.3)
Sodium: 139 mmol/L (ref 135–146)
Total Bilirubin: 0.3 mg/dL (ref 0.2–1.2)
Total Protein: 6.9 g/dL (ref 6.1–8.1)

## 2017-07-02 LAB — HEMOGLOBIN A1C
Hgb A1c MFr Bld: 6.4 % of total Hgb — ABNORMAL HIGH (ref <5.7)
Mean Plasma Glucose: 137 (calc)
eAG (mmol/L): 7.6 (calc)

## 2017-07-02 LAB — TSH: TSH: 0.64 mIU/L (ref 0.40–4.50)

## 2017-07-05 ENCOUNTER — Encounter: Payer: Self-pay | Admitting: Internal Medicine

## 2017-07-05 ENCOUNTER — Ambulatory Visit (INDEPENDENT_AMBULATORY_CARE_PROVIDER_SITE_OTHER): Payer: Medicare Other | Admitting: Internal Medicine

## 2017-07-05 VITALS — BP 138/80 | HR 78 | Temp 98.5°F | Ht 64.0 in | Wt 99.0 lb

## 2017-07-05 DIAGNOSIS — R739 Hyperglycemia, unspecified: Secondary | ICD-10-CM

## 2017-07-05 DIAGNOSIS — Z0000 Encounter for general adult medical examination without abnormal findings: Secondary | ICD-10-CM

## 2017-07-05 DIAGNOSIS — N3281 Overactive bladder: Secondary | ICD-10-CM

## 2017-07-05 DIAGNOSIS — I10 Essential (primary) hypertension: Secondary | ICD-10-CM | POA: Diagnosis not present

## 2017-07-05 DIAGNOSIS — D508 Other iron deficiency anemias: Secondary | ICD-10-CM

## 2017-07-05 DIAGNOSIS — M7502 Adhesive capsulitis of left shoulder: Secondary | ICD-10-CM | POA: Diagnosis not present

## 2017-07-05 DIAGNOSIS — R636 Underweight: Secondary | ICD-10-CM

## 2017-07-05 NOTE — ACP (Advance Care Planning) (Signed)
Met with patient only today.   She has done her ACP plans, but has not decided who to select as her HCPOA either her best friend or her boyfriend.  Has not brought [place] a copy.  Has been clear about DNR wishes.

## 2017-07-05 NOTE — Progress Notes (Signed)
Provider:  Rexene Edison. Mariea Clonts, D.O., C.M.D. Location:   [facility]   Place of Service:   clinic  Previous PCP: Gayland Curry, DO Patient Care Team: Gayland Curry, DO as PCP - General (Geriatric Medicine) Alden Hipp, MD as Consulting Physician (Obstetrics and Gynecology) Rutherford Guys, MD as Consulting Physician (Ophthalmology)  Extended Emergency Contact Information Primary Emergency Contact: [place] Phone: [phone] Relation: Friend Secondary Emergency Contact: Jeanella Flattery Mobile Phone: [phone] Relation: Significant other  Code Status: DNR Goals of Care: Advanced Directive information Advanced Directives 07/05/2017  Does Patient Have a Medical Advance Directive? No  Would patient like information on creating a medical advance directive? No - Patient declined    Chief Complaint  Patient presents with  . Annual Exam    cpe    HPI: Patient is a 82 y.o. female seen today for an annual physical exam.  She refuses AWV.  12/31, she was seen about her left shoulder pain which was initially thought to be bursitis, but it got worse over a week and then felt to be infected, went straight to the ED where she was diagnosed with septic bursitis.  She had incision and debridement on the shoulder 02/23/17 with Dr. Ninfa Linden.  She was treated with antibiotics and fluids--rocephin and then oral amoxicillin.  She was then sent to rehab.  She was treated with tylenol and lidoderm was ordered, but she never got it at [facility]  she was discharged home.    Sugar average was up to 6.6 at that time.  It's down to 6.4.  She saw Janett Billow for [facility] 03/15/17.  She had PT at the house and then went to outpatient PT after a while b/c it was hurting and she didn't think it was helping,  She's seen PA Clark a few more times since then.  She sees him next in June.  She continues to have pain in the shoulder.  During the day, it's mostly ok.  If she overextends it, it  hurts.  At night, she has a lot of bunching sensation in the left arm.  She cannot put the shoulder against the pillow.  It's like a topical sensitivity and a deep bunching pain that goes all the way down her arm, but it resolves, when she gets up and walks to the bathroom, for example.  Cannot find a comfortable position.  She's wondering where to go next.  It's a big inconvenience in her life.    She has some itchy stuff on the top of her scalp.  Left arm has a small spot on it that just showed up a couple days ago.    Eating habits have not changed.  Is eating canned beets due to a craving.  Is taking calcium b/c she thought it might help the shoulder (it has not).  Hot and cold at different times help the shoulder.    Reviewed persistent iron deficiency anemia.  Unable to tolerate leafy greens.  Underweight persists as well.    She has done her ACP plans, but has not decided who to select as her HCPOA either her best friend or her boyfriend.  Has not brought [place] a copy.  Has been clear about DNR wishes.    Past Medical History:  Diagnosis Date  . Age-related macular degeneration, dry, both eyes   . Anemia   . Anxiety   . Cholelithiasis   . Endometriosis   . Goiter   . Herpes simplex   .  Hyperlipidemia   . Hypertension   . Loss of weight   . Nontoxic multinodular goiter   . Other abnormal blood chemistry   . PUD (peptic ulcer disease)   . Renal cyst   . Sciatica   . Scoliosis (and kyphoscoliosis), idiopathic   . Unspecified disorder of kidney and ureter   . Urinary frequency    Past Surgical History:  Procedure Laterality Date  . ABDOMINAL HYSTERECTOMY  1971  . CATARACT EXTRACTION W/ INTRAOCULAR LENS  IMPLANT, BILATERAL Bilateral 2016   Dr. Rutherford Guys  . IRRIGATION AND DEBRIDEMENT ABSCESS Left 02/23/2017   Procedure: IRRIGATION AND DEBRIDEMENT ABSCESS;  Surgeon: Mcarthur Rossetti, MD;  Location: [facility];  Service: Orthopedics;  Laterality: Left;  . REPAIR OF PERFORATED  ULCER  1973 &1975   Dr Vida Rigger  . STOMACH SURGERY  [phone]   removed 1/2 stomach; "had a total of 5 ORs from 1973-1980; all related to perforated ulcer"  . TONSILLECTOMY      reports that she quit smoking about 23 years ago. Her smoking use included cigarettes. She has a 40.00 pack-year smoking history. She has never used smokeless tobacco. She reports that she does not drink alcohol or use drugs.  Functional Status Survey:    Family History  Problem Relation Age of Onset  . Obesity Mother     Health Maintenance  Topic Date Due  . FOOT EXAM  10/17/1947  . OPHTHALMOLOGY EXAM  07/17/2017  . HEMOGLOBIN A1C  01/01/2018  . MAMMOGRAM  Discontinued    No Known Allergies  Outpatient Encounter Medications as of 07/05/2017  Medication Sig  . acetaminophen (TYLENOL) 500 MG tablet Take 1,000 mg by mouth every 6 (six) hours as needed for mild pain.  Marland Kitchen ALPRAZolam (XANAX) 0.25 MG tablet TAKE 1 TABLET BY MOUTH AT BEDTIME AS NEEDED  . BYSTOLIC 10 MG tablet TAKE 1/2 TABLET BY MOUTH ONCE DAILY  . EXFORGE 10-320 MG tablet take 1 tablet by mouth once daily  . [DISCONTINUED] diclofenac sodium (VOLTAREN) 1 % GEL Apply 2 g topically 4 (four) times daily. APPLY TO LEFT SHOULDER  . [DISCONTINUED] traMADol (ULTRAM) 50 MG tablet Take 1 tablet (50 mg total) by mouth at bedtime as needed. Prescribed by ortho   No facility-administered encounter medications on file as of 07/05/2017.     Review of Systems  Constitutional: Negative for chills, fever and malaise/fatigue.  HENT: Negative for congestion and hearing loss.   Eyes: Negative for blurred vision.       Glasses  Respiratory: Negative for cough and shortness of breath.   Cardiovascular: Negative for chest pain and palpitations.  Gastrointestinal: Positive for diarrhea. Negative for abdominal pain, constipation, melena, nausea and vomiting.       Irritable bowel  Genitourinary: Positive for frequency. Negative for dysuria and urgency.        Frequency of every 2-3 hrs at hs  Musculoskeletal: Positive for joint pain. Negative for falls.       Poor ROM since her septic bursitis  Skin: Positive for itching and rash.       Dry scaly spots on scalp, sudden purple spot on left arm  Neurological: Negative for dizziness and loss of consciousness.  Endo/Heme/Allergies: Bruises/bleeds easily.  Psychiatric/Behavioral: Negative for depression and memory loss. The patient has insomnia. The patient is not nervous/anxious.        Does not sleep well due to shoulder pain and OAB    Vitals:   07/05/17 1341  BP: 138/80  Pulse: 78  Temp: 98.5 F (36.9 C)  TempSrc: Oral  SpO2: 99%  Weight: 99 lb (44.9 kg)  Height: 5\' 4"  (1.626 m)   Body mass index is 16.99 kg/m. Physical Exam  Constitutional: She is oriented to person, place, and time. No distress.  HENT:  Head: Normocephalic and atraumatic.  Right Ear: External ear normal.  Left Ear: External ear normal.  Nose: Nose normal.  Mouth/Throat: Oropharynx is clear and moist. No oropharyngeal exudate.  Eyes: Pupils are equal, round, and reactive to light. Conjunctivae are normal.  Neck: Normal range of motion. Neck supple. No JVD present. No tracheal deviation present. No thyromegaly present.  Cardiovascular: Normal rate, regular rhythm, normal heart sounds and intact distal pulses.  No murmur heard. Pulmonary/Chest: Effort normal and breath sounds normal. No respiratory distress.  Abdominal: Soft. Bowel sounds are normal. She exhibits no distension and no mass. There is no tenderness. There is no guarding.  Musculoskeletal: She exhibits tenderness.  Over anterolateral shoulder; decreased extension, abduction, internal rotation of left shoulder  Lymphadenopathy:    She has no cervical adenopathy.  Neurological: She is alert and oriented to person, place, and time. No cranial nerve deficit.  Skin: Skin is warm and dry. Capillary refill takes less than 2 seconds. There is pallor.  Three  small scaly places on scalp that she's scratched open; left arm with pinpoint purple papule  Psychiatric: She has a normal mood and affect. Her behavior is normal. Judgment and thought content normal.    Labs reviewed: Basic Metabolic Panel: Recent Labs    02/24/17 0515 02/26/17 0552 03/15/17 1345 07/01/17 0815  NA 135 136 135 139  K 4.1 4.4 4.8 4.9  CL 101 103 103 106  CO2 25 25 22 25   GLUCOSE 200* 94 71 112*  BUN 17 22* 24 21  CREATININE 1.04* 0.95 1.00* 1.17*  CALCIUM 7.6* 7.6* 8.6 9.8  MG 1.5*  --   --   --    Liver Function Tests: Recent Labs    02/22/17 2016 03/15/17 1345 07/01/17 0815  AST 25 28 21   ALT 21 19 14   ALKPHOS 138*  --   --   BILITOT 0.9 0.2 0.3  PROT 7.1 6.6 6.9  ALBUMIN 2.4*  --   --    No results for input(s): LIPASE, AMYLASE in the last 8760 hours. No results for input(s): AMMONIA in the last 8760 hours. CBC: Recent Labs    02/25/17 0512 02/26/17 0552 03/15/17 1345 07/01/17 0815  WBC 22.2* 16.3* 5.2 7.7  NEUTROABS 19.1*  --  3,021 3,504  HGB 8.4* 8.5* 8.5* 9.9*  HCT 27.2* 26.6* 26.5* 30.8*  MCV 81.9 82.4 81.5 75.3*  PLT 493* 508* 555* 346   Cardiac Enzymes: No results for input(s): CKTOTAL, CKMB, CKMBINDEX, TROPONINI in the last 8760 hours. BNP: Invalid input(s): POCBNP Lab Results  Component Value Date   HGBA1C 6.4 (H) 07/01/2017   Lab Results  Component Value Date   TSH 0.64 07/01/2017   Imaging and Procedures Recently: EKG performed today:  Sinus rhythm w/o acute ischemia or infarct.  Assessment/Plan 1. Annual physical exam - performed today, refuses further vaccinations - CBC with Differential/Platelet; Future - COMPLETE METABOLIC PANEL WITH GFR; Future - Hemoglobin A1c; Future  2. Essential hypertension, benign -bp at goal, cont same meds - CBC with Differential/Platelet; Future - COMPLETE METABOLIC PANEL WITH GFR; Future - EKG 12-Lead  3. Hyperglycemia - improved after resolution of shoulder infection -  Hemoglobin A1c; Future  4. Overactive bladder -says she's going to deal with this, worse at night  5. Iron deficiency anemia secondary to inadequate dietary iron intake - encouraged iron in diet, ensure or boost short term to get this back up and her underweight status - CBC with Differential/Platelet; Future  6. Adhesive capsulitis of left shoulder -encouraged further PT for this if recommended by orthopedics, as well -also suggested topical analgesics  F/u 6 mos, labs before  Labs/tests ordered:   Orders Placed This Encounter  Procedures  . CBC with Differential/Platelet    Standing Status:   Future    Standing Expiration Date:   07/06/2018  . COMPLETE METABOLIC PANEL WITH GFR    Standing Status:   Future    Standing Expiration Date:   07/06/2018  . Hemoglobin A1c    Standing Status:   Future    Standing Expiration Date:   07/06/2018  . EKG 12-Lead     Rhia Blatchford L. Tyrees Chopin, D.O. [facility] [address], [place] [address] Cell Phone (Mon-Fri 8am-5pm):  [phone] On Call:  [phone] & follow prompts after 5pm & weekends Office Phone:  [phone] Office Fax:  [phone]

## 2017-07-05 NOTE — Patient Instructions (Signed)
Please get your advance care planning done and bring [place] a copy of your living will/health care power of attorney documents.

## 2017-07-23 ENCOUNTER — Other Ambulatory Visit: Payer: Self-pay | Admitting: Nurse Practitioner

## 2017-08-04 ENCOUNTER — Other Ambulatory Visit: Payer: Self-pay | Admitting: Internal Medicine

## 2017-08-04 ENCOUNTER — Ambulatory Visit (INDEPENDENT_AMBULATORY_CARE_PROVIDER_SITE_OTHER): Payer: Medicare Other | Admitting: Physician Assistant

## 2017-08-04 ENCOUNTER — Other Ambulatory Visit (INDEPENDENT_AMBULATORY_CARE_PROVIDER_SITE_OTHER): Payer: Self-pay

## 2017-08-04 ENCOUNTER — Encounter (INDEPENDENT_AMBULATORY_CARE_PROVIDER_SITE_OTHER): Payer: Self-pay | Admitting: Physician Assistant

## 2017-08-04 DIAGNOSIS — I10 Essential (primary) hypertension: Secondary | ICD-10-CM

## 2017-08-04 DIAGNOSIS — L02414 Cutaneous abscess of left upper limb: Secondary | ICD-10-CM

## 2017-08-04 DIAGNOSIS — M25512 Pain in left shoulder: Secondary | ICD-10-CM

## 2017-08-04 DIAGNOSIS — M7502 Adhesive capsulitis of left shoulder: Secondary | ICD-10-CM

## 2017-08-04 NOTE — Progress Notes (Signed)
HPI: [name] returns today follow-up of her left shoulder.  She is 5 months status post I&D of a left shoulder abscess.  She states that her pain is greatly dissipated.  She only has pain mainly whenever she is lying down or if she is up ambulating she does not have significant pain in the shoulder.  She does have some pain with range of motion particularly reaching overhead and on her back.  She said no fevers chills.  Recently saw her primary care physician and white count was 7700.  Review of systems: No recent fevers chills shortness of breath nausea or vomiting.  Physical exam: Left shoulder surgical incisions well-healed no signs of infection.  She has good range of motion of her elbow forearm without pain.  She has 5 out of 5 strength with external and internal rotation against resistance bilateral shoulders.  Empty can test negative bilaterally.  Forward flexion actively 150 to 155 degrees and 160 degrees passively.  She has limited external rotation.  Negative impingement testing.  Impression: 5 months status post I&D left shoulder abscess Left shoulder pain  Plan: We will send her back to formal physical therapy for range of motion and modalities left shoulder.  Have her follow-up with [place] in 2 months.  At that time if she is continues to have decreased range of motion pain may consider steroid injection as it would be 7 months status post I&D.

## 2017-08-25 ENCOUNTER — Ambulatory Visit (INDEPENDENT_AMBULATORY_CARE_PROVIDER_SITE_OTHER): Payer: Medicare Other

## 2017-08-25 ENCOUNTER — Other Ambulatory Visit: Payer: Self-pay | Admitting: Internal Medicine

## 2017-08-25 ENCOUNTER — Other Ambulatory Visit: Payer: Self-pay | Admitting: *Deleted

## 2017-08-25 VITALS — BP 140/68 | HR 70 | Temp 98.4°F | Ht 64.0 in | Wt 105.0 lb

## 2017-08-25 DIAGNOSIS — Z0000 Encounter for general adult medical examination without abnormal findings: Secondary | ICD-10-CM | POA: Diagnosis not present

## 2017-08-25 NOTE — Patient Instructions (Addendum)
Ms. [name] , Thank you for taking time to come for your Medicare Wellness Visit. I appreciate your ongoing commitment to your health goals. Please review the following plan we discussed and let me know if I can assist you in the future.   Screening recommendations/referrals: Colonoscopy excluded, over age 80 Mammogram excluded, over age 29 Bone Density excluded Recommended yearly ophthalmology/optometry visit for glaucoma screening and checkup Recommended yearly dental visit for hygiene and checkup  Vaccinations: Influenza vaccine excluded Pneumococcal vaccine excluded Tdap vaccine excluded Shingles vaccine excluded    Advanced directives: Please fill out health care power of attorney and living will and bring [place] a copy for your record  Conditions/risks identified: none  Next appointment: Dr. Mariea Clonts 01/10/2018 @ 1pm            Preventive Care 65 Years and Older, Female Preventive care refers to lifestyle choices and visits with your health care provider that can promote health and wellness. What does preventive care include?  A yearly physical exam. This is also called an annual well check.  Dental exams once or twice a year.  Routine eye exams. Ask your health care provider how often you should have your eyes checked.  Personal lifestyle choices, including:  Daily care of your teeth and gums.  Regular physical activity.  Eating a healthy diet.  Avoiding tobacco and drug use.  Limiting alcohol use.  Practicing safe sex.  Taking low-dose aspirin every day.  Taking vitamin and mineral supplements as recommended by your health care provider. What happens during an annual well check? The services and screenings done by your health care provider during your annual well check will depend on your age, overall health, lifestyle risk factors, and family history of disease. Counseling  Your health care provider may ask you questions about your:  Alcohol use.  Tobacco  use.  Drug use.  Emotional well-being.  Home and relationship well-being.  Sexual activity.  Eating habits.  History of falls.  Memory and ability to understand (cognition).  Work and work Statistician.  Reproductive health. Screening  You may have the following tests or measurements:  Height, weight, and BMI.  Blood pressure.  Lipid and cholesterol levels. These may be checked every 5 years, or more frequently if you are over 13 years old.  Skin check.  Lung cancer screening. You may have this screening every year starting at age 42 if you have a 30-pack-year history of smoking and currently smoke or have quit within the past 15 years.  Fecal occult blood test (FOBT) of the stool. You may have this test every year starting at age 56.  Flexible sigmoidoscopy or colonoscopy. You may have a sigmoidoscopy every 5 years or a colonoscopy every 10 years starting at age 30.  Hepatitis C blood test.  Hepatitis B blood test.  Sexually transmitted disease (STD) testing.  Diabetes screening. This is done by checking your blood sugar (glucose) after you have not eaten for a while (fasting). You may have this done every 1-3 years.  Bone density scan. This is done to screen for osteoporosis. You may have this done starting at age 1.  Mammogram. This may be done every 1-2 years. Talk to your health care provider about how often you should have regular mammograms. Talk with your health care provider about your test results, treatment options, and if necessary, the need for more tests. Vaccines  Your health care provider may recommend certain vaccines, such as:  Influenza vaccine. This is recommended  every year.  Tetanus, diphtheria, and acellular pertussis (Tdap, Td) vaccine. You may need a Td booster every 10 years.  Zoster vaccine. You may need this after age 60.  Pneumococcal 13-valent conjugate (PCV13) vaccine. One dose is recommended after age 88.  Pneumococcal  polysaccharide (PPSV23) vaccine. One dose is recommended after age 9. Talk to your health care provider about which screenings and vaccines you need and how often you need them. This information is not intended to replace advice given to you by your health care provider. Make sure you discuss any questions you have with your health care provider. Document Released: 03/01/2015 Document Revised: 10/23/2015 Document Reviewed: 12/04/2014 Elsevier Interactive Patient Education  2017 [place] Prevention in the Home Falls can cause injuries. They can happen to people of all ages. There are many things you can do to make your home safe and to help prevent falls. What can I do on the outside of my home?  Regularly fix the edges of walkways and driveways and fix any cracks.  Remove anything that might make you trip as you walk through a door, such as a raised step or threshold.  Trim any bushes or trees on the path to your home.  Use bright outdoor lighting.  Clear any walking paths of anything that might make someone trip, such as rocks or tools.  Regularly check to see if handrails are loose or broken. Make sure that both sides of any steps have handrails.  Any raised decks and porches should have guardrails on the edges.  Have any leaves, snow, or ice cleared regularly.  Use sand or salt on walking paths during winter.  Clean up any spills in your garage right away. This includes oil or grease spills. What can I do in the bathroom?  Use night lights.  Install grab bars by the toilet and in the tub and shower. Do not use towel bars as grab bars.  Use non-skid mats or decals in the tub or shower.  If you need to sit down in the shower, use a plastic, non-slip stool.  Keep the floor dry. Clean up any water that spills on the floor as soon as it happens.  Remove soap buildup in the tub or shower regularly.  Attach bath mats securely with double-sided non-slip rug  tape.  Do not have throw rugs and other things on the floor that can make you trip. What can I do in the bedroom?  Use night lights.  Make sure that you have a light by your bed that is easy to reach.  Do not use any sheets or blankets that are too big for your bed. They should not hang down onto the floor.  Have a firm chair that has side arms. You can use this for support while you get dressed.  Do not have throw rugs and other things on the floor that can make you trip. What can I do in the kitchen?  Clean up any spills right away.  Avoid walking on wet floors.  Keep items that you use a lot in easy-to-reach places.  If you need to reach something above you, use a strong step stool that has a grab bar.  Keep electrical cords out of the way.  Do not use floor polish or wax that makes floors slippery. If you must use wax, use non-skid floor wax.  Do not have throw rugs and other things on the floor that can make you trip. What  can I do with my stairs?  Do not leave any items on the stairs.  Make sure that there are handrails on both sides of the stairs and use them. Fix handrails that are broken or loose. Make sure that handrails are as long as the stairways.  Check any carpeting to make sure that it is firmly attached to the stairs. Fix any carpet that is loose or worn.  Avoid having throw rugs at the top or bottom of the stairs. If you do have throw rugs, attach them to the floor with carpet tape.  Make sure that you have a light switch at the top of the stairs and the bottom of the stairs. If you do not have them, ask someone to add them for you. What else can I do to help prevent falls?  Wear shoes that:  Do not have high heels.  Have rubber bottoms.  Are comfortable and fit you well.  Are closed at the toe. Do not wear sandals.  If you use a stepladder:  Make sure that it is fully opened. Do not climb a closed stepladder.  Make sure that both sides of the  stepladder are locked into place.  Ask someone to hold it for you, if possible.  Clearly mark and make sure that you can see:  Any grab bars or handrails.  First and last steps.  Where the edge of each step is.  Use tools that help you move around (mobility aids) if they are needed. These include:  Canes.  Walkers.  Scooters.  Crutches.  Turn on the lights when you go into a dark area. Replace any light bulbs as soon as they burn out.  Set up your furniture so you have a clear path. Avoid moving your furniture around.  If any of your floors are uneven, fix them.  If there are any pets around you, be aware of where they are.  Review your medicines with your doctor. Some medicines can make you feel dizzy. This can increase your chance of falling. Ask your doctor what other things that you can do to help prevent falls. This information is not intended to replace advice given to you by your health care provider. Make sure you discuss any questions you have with your health care provider. Document Released: 11/29/2008 Document Revised: 07/11/2015 Document Reviewed: 03/09/2014 Elsevier Interactive Patient Education  2017 [employer].

## 2017-08-25 NOTE — Progress Notes (Signed)
Subjective:   [name] is a 82 y.o. female who presents for Medicare Annual (Subsequent) preventive examination.  Last AWV-07/01/2015       Objective:     Vitals: BP 140/68 (BP Location: Left Arm, Patient Position: Sitting)   Pulse 70   Temp 98.4 F (36.9 C) (Oral)   Ht 5\' 4"  (1.626 m)   Wt 105 lb (47.6 kg)   SpO2 98%   BMI 18.02 kg/m   Body mass index is 18.02 kg/m.  Advanced Directives 08/25/2017 07/05/2017 04/12/2017 02/22/2017 01/19/2017 01/04/2017 10/20/2016  Does Patient Have a Medical Advance Directive? No No No No No No No  Would patient like information on creating a medical advance directive? No - Patient declined No - Patient declined No - Patient declined No - Patient declined - Yes (MAU/Ambulatory/Procedural Areas - Information given) -    Tobacco Social History   Tobacco Use  Smoking Status Former Smoker  . Packs/day: 1.00  . Years: 40.00  . Pack years: 40.00  . Types: Cigarettes  . Last attempt to quit: 01/09/1994  . Years since quitting: 23.6  Smokeless Tobacco Never Used     Counseling given: Not Answered   Clinical Intake:  Pre-visit preparation completed: No  Pain : No/denies pain     Nutritional Risks: None Diabetes: No  How often do you need to have someone help you when you read instructions, pamphlets, or other written materials from your doctor or pharmacy?: 1 - Never What is the last grade level you completed in school?: Some college  Interpreter Needed?: No  Information entered by :: Tyson Dense, RN  Past Medical History:  Diagnosis Date  . Age-related macular degeneration, dry, both eyes   . Anemia   . Anxiety   . Cholelithiasis   . Endometriosis   . Goiter   . Herpes simplex   . Hyperlipidemia   . Hypertension   . Loss of weight   . Nontoxic multinodular goiter   . Other abnormal blood chemistry   . PUD (peptic ulcer disease)   . Renal cyst   . Sciatica   . Scoliosis (and kyphoscoliosis), idiopathic   .  Unspecified disorder of kidney and ureter   . Urinary frequency    Past Surgical History:  Procedure Laterality Date  . ABDOMINAL HYSTERECTOMY  1971  . CATARACT EXTRACTION W/ INTRAOCULAR LENS  IMPLANT, BILATERAL Bilateral 2016   Dr. Rutherford Guys  . IRRIGATION AND DEBRIDEMENT ABSCESS Left 02/23/2017   Procedure: IRRIGATION AND DEBRIDEMENT ABSCESS;  Surgeon: Mcarthur Rossetti, MD;  Location: [place];  Service: Orthopedics;  Laterality: Left;  . REPAIR OF PERFORATED ULCER  1973 &1975   Dr Vida Rigger  . STOMACH SURGERY  [phone]   removed 1/2 stomach; "had a total of 5 ORs from 1973-1980; all related to perforated ulcer"  . TONSILLECTOMY     Family History  Problem Relation Age of Onset  . Obesity Mother    Social History   Socioeconomic History  . Marital status: Divorced    Spouse name: Not on file  . Number of children: Not on file  . Years of education: Not on file  . Highest education level: Not on file  Occupational History  . Not on file  Social Needs  . Financial resource strain: Not hard at all  . Food insecurity:    Worry: Never true    Inability: Never true  . Transportation needs:    Medical: No    Non-medical:  No  Tobacco Use  . Smoking status: Former Smoker    Packs/day: 1.00    Years: 40.00    Pack years: 40.00    Types: Cigarettes    Last attempt to quit: 01/09/1994    Years since quitting: 23.6  . Smokeless tobacco: Never Used  Substance and Sexual Activity  . Alcohol use: No    Comment: 02/25/2017 "recovering alcoholic [id number]"  . Drug use: No  . Sexual activity: Not Currently  Lifestyle  . Physical activity:    Days per week: 1 day    Minutes per session: 30 min  . Stress: Only a little  Relationships  . Social connections:    Talks on phone: More than three times a week    Gets together: Twice a week    Attends religious service: More than 4 times per year    Active member of club or organization: No    Attends meetings of clubs or  organizations: Never    Relationship status: Divorced  Other Topics Concern  . Not on file  Social History Narrative  . Not on file    Outpatient Encounter Medications as of 08/25/2017  Medication Sig  . acetaminophen (TYLENOL) 500 MG tablet Take 1,000 mg by mouth every 6 (six) hours as needed for mild pain.  Marland Kitchen ALPRAZolam (XANAX) 0.25 MG tablet TAKE 1 TABLET BY MOUTH EVERY NIGHT AT BEDTIME AS NEEDED  . BYSTOLIC 10 MG tablet TAKE 1/2 TABLET BY MOUTH ONCE DAILY  . EXFORGE 10-320 MG tablet take 1 tablet by mouth once daily   No facility-administered encounter medications on file as of 08/25/2017.     Activities of Daily Living In your present state of health, do you have any difficulty performing the following activities: 08/25/2017 02/23/2017  Hearing? N -  [place]? N -  Difficulty concentrating or making decisions? N -  Walking or climbing stairs? N -  Dressing or bathing? N -  Doing errands, shopping? N Y  Conservation officer, nature and eating ? N -  Using the Toilet? N -  In the past six months, have you accidently leaked urine? N -  Do you have problems with loss of bowel control? N -  Managing your Medications? N -  Managing your Finances? N -  Housekeeping or managing your Housekeeping? N -  Some recent data might be hidden    Patient Care Team: Gayland Curry, DO as PCP - General (Geriatric Medicine) Alden Hipp, MD as Consulting Physician (Obstetrics and Gynecology) Rutherford Guys, MD as Consulting Physician (Ophthalmology)    Assessment:   This is a routine wellness examination for [name].  Exercise Activities and Dietary recommendations Current Exercise Habits: Home exercise routine, Type of exercise: yoga;walking, Time (Minutes): 30, Frequency (Times/Week): 1, Weekly Exercise (Minutes/Week): 30, Intensity: Mild, Exercise limited by: None identified  Goals    None      Fall Risk Fall Risk  08/25/2017 07/05/2017 03/15/2017 02/22/2017 02/15/2017  Falls in the past  year? No No No No No   Is the patient's home free of loose throw rugs in walkways, pet beds, electrical cords, etc?   yes      Grab bars in the bathroom? no      Handrails on the stairs?   yes      Adequate lighting?   yes   Depression Screen PHQ 2/9 Scores 08/25/2017 07/05/2017 07/06/2016 07/01/2015  PHQ - 2 Score 0 0 0 0  Cognitive Function MMSE - Mini Mental State Exam 08/25/2017 07/06/2016 07/01/2015 04/09/2014  Orientation to time 5 5 5 5   Orientation to Place 5 5 5 5   Registration 3 3 3 3   Attention/ Calculation 5 5 5 5   Recall 3 3 2 2   Language- name 2 objects 2 2 2 2   Language- repeat 1 1 1 1   Language- follow 3 step command 3 3 3 3   Language- read & follow direction 1 1 1 1   Write a sentence 1 1 1 1   Copy design 1 1 1 1   Total score 30 30 29 29          There is no immunization history on file for this patient.  Qualifies for Shingles Vaccine? Patient declined  Screening Tests Health Maintenance  Topic Date Due  . FOOT EXAM  10/17/1945  . OPHTHALMOLOGY EXAM  07/17/2017  . HEMOGLOBIN A1C  01/01/2018  . MAMMOGRAM  Discontinued    Cancer Screenings: Lung: Low Dose CT Chest recommended if Age 28-80 years, 30 pack-year currently smoking OR have quit w/in 15years. Patient does not qualify. Breast:  Up to date on Mammogram? Excluded  Up to date of Bone Density/Dexa? Excluded  Colorectal: excluded  Additional Screenings:  Hepatitis C Screening: declined Patient is not a diabetic and does not want to continue getting annual eye exams    Plan:    I have personally reviewed and addressed the Medicare Annual Wellness questionnaire and have noted the following in the patient's chart:  A. Medical and social history B. Use of alcohol, tobacco or illicit drugs  C. Current medications and supplements D. Functional ability and status E.  Nutritional status F.  Physical activity G. Advance directives H. List of other physicians I.  Hospitalizations, surgeries, and ER  visits in previous 12 months J.  [place] to include hearing, vision, cognitive, depression L. Referrals and appointments - none  In addition, I have reviewed and discussed with patient certain preventive protocols, quality metrics, and best practice recommendations. A written personalized care plan for preventive services as well as general preventive health recommendations were provided to patient.  See attached scanned questionnaire for additional information.   Signed,   Tyson Dense, RN Nurse Health Advisor  Patient Concerns: Has itchy spots on head and would like them checked out. Will make an appointment with dermatologist

## 2017-09-22 ENCOUNTER — Other Ambulatory Visit: Payer: Self-pay | Admitting: Internal Medicine

## 2017-09-30 ENCOUNTER — Ambulatory Visit (INDEPENDENT_AMBULATORY_CARE_PROVIDER_SITE_OTHER): Payer: Medicare Other | Admitting: Family

## 2017-09-30 ENCOUNTER — Encounter: Payer: Self-pay | Admitting: Family

## 2017-09-30 VITALS — BP 160/84 | HR 72 | Temp 98.0°F | Resp 16 | Ht 64.0 in | Wt 105.2 lb

## 2017-09-30 DIAGNOSIS — L409 Psoriasis, unspecified: Secondary | ICD-10-CM | POA: Diagnosis not present

## 2017-09-30 MED ORDER — ALPRAZOLAM 0.25 MG PO TABS: 0.2500 mg | ORAL_TABLET | Freq: Every evening | ORAL | 0 refills | Status: DC | PRN

## 2017-09-30 MED ORDER — KETOCONAZOLE 2 % EX SHAM: MEDICATED_SHAMPOO | CUTANEOUS | Status: DC

## 2017-09-30 MED ORDER — BETAMETHASONE VALERATE 0.1 % EX OINT: 1.0000 "application " | TOPICAL_OINTMENT | Freq: Two times a day (BID) | CUTANEOUS | 0 refills | Status: DC

## 2017-09-30 NOTE — Patient Instructions (Signed)
1. Apply pea size Betamethasone 0.1 % cream to affected areas twice daily  For 2 weeks then may apply as needed. 2.Please use Ketoconazole shampoo twice a week to wash your hair. 3.Notify provider if rash worsen or not relieved.    Psoriasis Psoriasis is a long-term (chronic) skin condition. It causes raised, red patches (plaques) on your skin that look silvery. The red patches may show up anywhere on your body. They can be any size or shape. Psoriasis can come and go. It can range from mild to very bad. It cannot be passed from one person to another (not contagious). There is no cure for this condition, but it can be helped with treatment. Follow these instructions at home: Skin Care  Apply moisturizers to your skin as needed. Only use those that your doctor has said are okay.  Apply cool compresses to the affected areas.  Do not scratch your skin. Lifestyle   Do not use tobacco products. This includes cigarettes, chewing tobacco, and e-cigarettes. If you need help quitting, ask your doctor.  Drink little or no alcohol.  Try to lower your stress. Meditation or yoga may help.  Get sun as told by your doctor. Do not get sunburned.  Think about joining a psoriasis support group. Medicines  Take or use over-the-counter and prescription medicines only as told by your doctor.  If you were prescribed an antibiotic, take or use it as told by your doctor. Do not stop taking the antibiotic even if your condition starts to get better. General instructions  Keep a journal. Use this to help track what triggers an outbreak. Try to avoid any triggers.  See a counselor or social worker if you feel very sad, upset, or hopeless about your condition and these feelings affect your work or relationships.  Keep all follow-up visits as told by your doctor. This is important. Contact a doctor if:  Your pain gets worse.  You have more redness or warmth in the affected areas.  You have new pain  or stiffness in your joints.  Your pain or stiffness in your joints gets worse.  Your nails start to break easily.  Your nails pull away from the nail bed easily.  You have a fever.  You feel very sad (depressed). This information is not intended to replace advice given to you by your health care provider. Make sure you discuss any questions you have with your health care provider. Document Released: 03/12/2004 Document Revised: 07/11/2015 Document Reviewed: 06/20/2014 Elsevier Interactive Patient Education  2018 [employer].

## 2017-09-30 NOTE — Progress Notes (Signed)
Provider: Jahmar Mckelvy FNP-C  Gayland Curry, DO  Patient Care Team: Gayland Curry, DO as PCP - General (Geriatric Medicine) Alden Hipp, MD as Consulting Physician (Obstetrics and Gynecology) Rutherford Guys, MD as Consulting Physician (Ophthalmology)  Extended Emergency Contact Information Primary Emergency Contact: [place] Phone: [phone] Relation: Friend Secondary Emergency Contact: Jeanella Flattery Mobile Phone: [phone] Relation: Significant other   Goals of care: Advanced Directive information Advanced Directives 09/30/2017  Does Patient Have a Medical Advance Directive? No  Would patient like information on creating a medical advance directive? -     Chief Complaint  Patient presents with  . Rash    started on head and now has some on body; itching and "dry"; OTC meds no help    HPI:  Pt is a 82 y.o. female seen today [place] office for an acute visit for evaluation of rash.she states rash started on the head but now spread to the back and armpit.She describes rash as dry and  Itchy.she as used OTC Cortizone cream without any relief.of note she states has a history of psoriasis but has not had it for a very long time.she denies any fever,chills or change in her lotion or detergent.    Past Medical History:  Diagnosis Date  . Age-related macular degeneration, dry, both eyes   . Anemia   . Anxiety   . Cholelithiasis   . Endometriosis   . Goiter   . Herpes simplex   . Hyperlipidemia   . Hypertension   . Loss of weight   . Nontoxic multinodular goiter   . Other abnormal blood chemistry   . PUD (peptic ulcer disease)   . Renal cyst   . Sciatica   . Scoliosis (and kyphoscoliosis), idiopathic   . Unspecified disorder of kidney and ureter   . Urinary frequency    Past Surgical History:  Procedure Laterality Date  . ABDOMINAL HYSTERECTOMY  1971  . CATARACT EXTRACTION W/ INTRAOCULAR LENS  IMPLANT, BILATERAL  Bilateral 2016   Dr. Rutherford Guys  . IRRIGATION AND DEBRIDEMENT ABSCESS Left 02/23/2017   Procedure: IRRIGATION AND DEBRIDEMENT ABSCESS;  Surgeon: Mcarthur Rossetti, MD;  Location: [facility];  Service: Orthopedics;  Laterality: Left;  . REPAIR OF PERFORATED ULCER  1973 &1975   Dr Vida Rigger  . STOMACH SURGERY  [phone]   removed 1/2 stomach; "had a total of 5 ORs from 1973-1980; all related to perforated ulcer"  . TONSILLECTOMY      No Known Allergies  Outpatient Encounter Medications as of 09/30/2017  Medication Sig  . ALPRAZolam (XANAX) 0.25 MG tablet TAKE 1 TABLET BY MOUTH EVERY NIGHT AT BEDTIME AS NEEDED  . BYSTOLIC 10 MG tablet TAKE 1/2 TABLET BY MOUTH ONCE DAILY  . EXFORGE 10-320 MG tablet take 1 tablet by mouth once daily  . [DISCONTINUED] acetaminophen (TYLENOL) 500 MG tablet Take 1,000 mg by mouth every 6 (six) hours as needed for mild pain.   No facility-administered encounter medications on file as of 09/30/2017.     Review of Systems  Constitutional: Negative for appetite change, chills, fatigue and fever.  HENT: Negative for congestion, rhinorrhea, sinus pressure, sinus pain, sneezing and sore throat.   Eyes: Positive for visual disturbance. Negative for discharge, redness and itching.       Wears eye glasses  Respiratory: Negative for cough, chest tightness, shortness of breath and wheezing.   Cardiovascular: Negative for chest pain, palpitations and leg swelling.  Gastrointestinal: Negative for abdominal  distention, abdominal pain, constipation, diarrhea, nausea and vomiting.  Musculoskeletal: Negative for arthralgias and gait problem.  Skin: Negative for color change, pallor and wound.       Rash and itching per HPI      There is no immunization history on file for this patient. Pertinent  Health Maintenance Due  Topic Date Due  . FOOT EXAM  10/17/1945  . OPHTHALMOLOGY EXAM  07/17/2017  . HEMOGLOBIN A1C  01/01/2018  . MAMMOGRAM  Discontinued   Fall Risk   08/25/2017 07/05/2017 03/15/2017 02/22/2017 02/15/2017  Falls in the past year? No No No No No    Vitals:   09/30/17 0956  BP: (!) 160/84  Pulse: 72  Resp: 16  Temp: 98 F (36.7 C)  TempSrc: Oral  SpO2: 96%  Weight: 105 lb 3.2 oz (47.7 kg)  Height: 5\' 4"  (1.626 m)   Body mass index is 18.06 kg/m. Physical Exam  Constitutional: She is oriented to person, place, and time.  Thin built,elderly in no acute distress   HENT:  Head: Normocephalic.  Right Ear: External ear normal.  Left Ear: External ear normal.  Mouth/Throat: Oropharynx is clear and moist. No oropharyngeal exudate.  Eyes: Pupils are equal, round, and reactive to light. Conjunctivae and EOM are normal. Right eye exhibits no discharge. Left eye exhibits no discharge. No scleral icterus.  Neck: Normal range of motion. No JVD present. No thyromegaly present.  Cardiovascular: Normal rate, regular rhythm, normal heart sounds and intact distal pulses. Exam reveals no gallop and no friction rub.  No murmur heard. Pulmonary/Chest: Effort normal and breath sounds normal. No respiratory distress. She has no wheezes. She has no rales.  Abdominal: Soft. Bowel sounds are normal. She exhibits no distension and no mass. There is no tenderness. There is no rebound and no guarding.  Musculoskeletal: Normal range of motion. She exhibits no edema or tenderness.  Lymphadenopathy:    She has no cervical adenopathy.  Neurological: She is oriented to person, place, and time.  Skin: Skin is warm and dry. No rash noted. No pallor.  Erythematous raised edges small papular plaque on left axilla, and small areas on right flank area.back erythema blanchable. Small erythematous papule to crown of head noted.   Psychiatric: She has a normal mood and affect. Her behavior is normal. Judgment and thought content normal.  Vitals reviewed.   Labs reviewed: Recent Labs    02/24/17 0515 02/26/17 0552 03/15/17 1345 07/01/17 0815  NA 135 136 135 139  K  4.1 4.4 4.8 4.9  CL 101 103 103 106  CO2 25 25 22 25   GLUCOSE 200* 94 71 112*  BUN 17 22* 24 21  CREATININE 1.04* 0.95 1.00* 1.17*  CALCIUM 7.6* 7.6* 8.6 9.8  MG 1.5*  --   --   --    Recent Labs    02/22/17 2016 03/15/17 1345 07/01/17 0815  AST 25 28 21   ALT 21 19 14   ALKPHOS 138*  --   --   BILITOT 0.9 0.2 0.3  PROT 7.1 6.6 6.9  ALBUMIN 2.4*  --   --    Recent Labs    02/25/17 0512 02/26/17 0552 03/15/17 1345 07/01/17 0815  WBC 22.2* 16.3* 5.2 7.7  NEUTROABS 19.1*  --  3,021 3,504  HGB 8.4* 8.5* 8.5* 9.9*  HCT 27.2* 26.6* 26.5* 30.8*  MCV 81.9 82.4 81.5 75.3*  PLT 493* 508* 555* 346   Lab Results  Component Value Date   TSH 0.64 07/01/2017  Lab Results  Component Value Date   HGBA1C 6.4 (H) 07/01/2017   Lab Results  Component Value Date   CHOL 216 (H) 07/01/2017   HDL 132 07/01/2017   LDLCALC 67 07/01/2017   TRIG 90 07/01/2017   CHOLHDL 1.6 07/01/2017    Significant Diagnostic Results in last 30 days:  No results found.  Assessment/Plan  Psoriasis Afebrile.hx of psoriasis.Erythematous raised edges with small papular plaque on left axilla, and small areas on right flank area.small erythematous papular rash to crown of head.apply Betamethasone 0.1% cream apply pea size to affected areas twice daily x 2 weeks then use as needed.Notify provide if rash worsen or not resolved.use Ketoconazole 2% shampoo to wash your hair twice a week.   Family/ staff Communication: Reviewed plan of care with patient  Labs/tests ordered: None   Ajdin Macke C Ambrosia Wisnewski, NP

## 2017-10-11 ENCOUNTER — Ambulatory Visit (INDEPENDENT_AMBULATORY_CARE_PROVIDER_SITE_OTHER): Payer: Medicare Other | Admitting: Physician Assistant

## 2017-10-14 ENCOUNTER — Ambulatory Visit (INDEPENDENT_AMBULATORY_CARE_PROVIDER_SITE_OTHER): Payer: Medicare Other | Admitting: Family

## 2017-10-14 ENCOUNTER — Encounter: Payer: Self-pay | Admitting: Family

## 2017-10-14 VITALS — BP 138/72 | HR 67 | Temp 97.7°F | Resp 16 | Ht 64.0 in | Wt 103.2 lb

## 2017-10-14 DIAGNOSIS — L409 Psoriasis, unspecified: Secondary | ICD-10-CM

## 2017-10-14 NOTE — Progress Notes (Signed)
Provider: Dinah Ngetich FNP-C  Gayland Curry, DO  Patient Care Team: Gayland Curry, DO as PCP - General (Geriatric Medicine) Alden Hipp, MD as Consulting Physician (Obstetrics and Gynecology) Rutherford Guys, MD as Consulting Physician (Ophthalmology)  Extended Emergency Contact Information Primary Emergency Contact: [name] Phone: [phone] Relation: Friend Secondary Emergency Contact: Jeanella Flattery Mobile Phone: [phone] Relation: Significant other  Goals of care: Advanced Directive information Advanced Directives 10/14/2017  Does Patient Have a Medical Advance Directive? No  Would patient like information on creating a medical advance directive? -     Chief Complaint  Patient presents with  . Rash    no better and appears to be "different"    HPI:  Pt is a 82 y.o. female seen today at [facility] office for an acute visit for evaluation of rash.she states has used Ketoconazole 2% shampoo twice per week with much improvement but the rash over the body seems to be spreading has new areas on her right forearm.she has applied betamethasone 0.1% without any relief.she states wants to make sure that rash is not shingles or measles.she denies any fever or chills.     Past Medical History:  Diagnosis Date  . Age-related macular degeneration, dry, both eyes   . Anemia   . Anxiety   . Cholelithiasis   . Endometriosis   . Goiter   . Herpes simplex   . Hyperlipidemia   . Hypertension   . Loss of weight   . Nontoxic multinodular goiter   . Other abnormal blood chemistry   . PUD (peptic ulcer disease)   . Renal cyst   . Sciatica   . Scoliosis (and kyphoscoliosis), idiopathic   . Unspecified disorder of kidney and ureter   . Urinary frequency    Past Surgical History:  Procedure Laterality Date  . ABDOMINAL HYSTERECTOMY  1971  . CATARACT EXTRACTION W/ INTRAOCULAR LENS  IMPLANT, BILATERAL Bilateral 2016   Dr. Rutherford Guys    . IRRIGATION AND DEBRIDEMENT ABSCESS Left 02/23/2017   Procedure: IRRIGATION AND DEBRIDEMENT ABSCESS;  Surgeon: Mcarthur Rossetti, MD;  Location: [facility];  Service: Orthopedics;  Laterality: Left;  . REPAIR OF PERFORATED ULCER  1973 &1975   Dr Vida Rigger  . STOMACH SURGERY  [phone]   removed 1/2 stomach; "had a total of 5 ORs from 1973-1980; all related to perforated ulcer"  . TONSILLECTOMY      No Known Allergies  Outpatient Encounter Medications as of 10/14/2017  Medication Sig  . ALPRAZolam (XANAX) 0.25 MG tablet Take 1 tablet (0.25 mg total) by mouth at bedtime as needed.  . betamethasone valerate ointment (VALISONE) 0.1 % Apply 1 application topically 2 (two) times daily.  Marland Kitchen BYSTOLIC 10 MG tablet TAKE 1/2 TABLET BY MOUTH ONCE DAILY  . EXFORGE 10-320 MG tablet take 1 tablet by mouth once daily   Facility-Administered Encounter Medications as of 10/14/2017  Medication  . ketoconazole (NIZORAL) 2 % shampoo    Review of Systems  Constitutional: Negative for chills, fatigue and fever.  HENT: Negative for congestion, rhinorrhea, sinus pressure, sinus pain, sneezing and sore throat.   Eyes: Positive for visual disturbance. Negative for discharge, redness and itching.       Wears eye glasses   Respiratory: Negative for cough, chest tightness, shortness of breath and wheezing.   Cardiovascular: Negative for chest pain, palpitations and leg swelling.  Gastrointestinal: Negative for abdominal distention, abdominal pain, constipation, diarrhea, nausea and vomiting.  Skin: Positive for  rash. Negative for color change, pallor and wound.  Neurological: Negative for dizziness, light-headedness and headaches.  Psychiatric/Behavioral: Negative for agitation, confusion and sleep disturbance.     There is no immunization history on file for this patient. Pertinent  Health Maintenance Due  Topic Date Due  . FOOT EXAM  10/17/1945  . OPHTHALMOLOGY EXAM  07/17/2017  . HEMOGLOBIN A1C   01/01/2018  . MAMMOGRAM  Discontinued   Fall Risk  08/25/2017 07/05/2017 03/15/2017 02/22/2017 02/15/2017  Falls in the past year? No No No No No    Vitals:   10/14/17 1332  BP: 138/72  Pulse: 67  Resp: 16  Temp: 97.7 F (36.5 C)  TempSrc: Oral  SpO2: 99%  Weight: 103 lb 3.2 oz (46.8 kg)  Height: 5\' 4"  (1.626 m)   Body mass index is 17.71 kg/m. Physical Exam  Constitutional: She is oriented to person, place, and time.  Thin built,elderly in no acute distress   HENT:  Head: Normocephalic.  Mouth/Throat: Oropharynx is clear and moist. No oropharyngeal exudate.  Eyes: Pupils are equal, round, and reactive to light. Conjunctivae are normal. Right eye exhibits no discharge. Left eye exhibits no discharge. No scleral icterus.  Neck: Normal range of motion.  Cardiovascular: Normal rate, regular rhythm, normal heart sounds and intact distal pulses. Exam reveals no gallop and no friction rub.  No murmur heard. Pulmonary/Chest: Effort normal and breath sounds normal. No respiratory distress. She has no wheezes. She has no rales.  Abdominal: Soft. Bowel sounds are normal. She exhibits no distension and no mass. There is no tenderness. There is no rebound and no guarding.  Musculoskeletal: Normal range of motion. She exhibits no edema or tenderness.  Lymphadenopathy:    She has no cervical adenopathy.  Neurological: She is oriented to person, place, and time.  Skin: Skin is warm and dry. No erythema. No pallor.  Previous erythematous raised edges papular plaque on left axilla has improved but plaques on the right upper flank area persist.New small erythematous papular rash on right forearm.   Psychiatric: Her speech is normal and behavior is normal. Judgment and thought content normal. Her mood appears anxious.  Vitals reviewed.   Labs reviewed: Recent Labs    02/24/17 0515 02/26/17 0552 03/15/17 1345 07/01/17 0815  NA 135 136 135 139  K 4.1 4.4 4.8 4.9  CL 101 103 103 106  CO2 25  25 22 25   GLUCOSE 200* 94 71 112*  BUN 17 22* 24 21  CREATININE 1.04* 0.95 1.00* 1.17*  CALCIUM 7.6* 7.6* 8.6 9.8  MG 1.5*  --   --   --    Recent Labs    02/22/17 2016 03/15/17 1345 07/01/17 0815  AST 25 28 21   ALT 21 19 14   ALKPHOS 138*  --   --   BILITOT 0.9 0.2 0.3  PROT 7.1 6.6 6.9  ALBUMIN 2.4*  --   --    Recent Labs    02/25/17 0512 02/26/17 0552 03/15/17 1345 07/01/17 0815  WBC 22.2* 16.3* 5.2 7.7  NEUTROABS 19.1*  --  3,021 3,504  HGB 8.4* 8.5* 8.5* 9.9*  HCT 27.2* 26.6* 26.5* 30.8*  MCV 81.9 82.4 81.5 75.3*  PLT 493* 508* 555* 346   Lab Results  Component Value Date   TSH 0.64 07/01/2017   Lab Results  Component Value Date   HGBA1C 6.4 (H) 07/01/2017   Lab Results  Component Value Date   CHOL 216 (H) 07/01/2017   HDL 132  07/01/2017   LDLCALC 67 07/01/2017   TRIG 90 07/01/2017   CHOLHDL 1.6 07/01/2017    Significant Diagnostic Results in last 30 days:  No results found.  Assessment/Plan 1. Psoriasis Afebrile.Previous erythematous raised edges papular plaque on left axilla has improved but plaques on the right upper flank area persist.New small erythematous papular rash on right forearm.continue on ketoconazole 2% shampoo twice weekly and Betamethasone 7.1% one application to affected areas twice daily.   - Ambulatory referral to Dermatology for evaluation of rash.   Family/ staff Communication: Reviewed plan of care with patient.  Labs/tests ordered: None   Dinah C Ngetich, NP

## 2017-10-14 NOTE — Patient Instructions (Signed)
Follow up with dermatology for Psoriasis evaluation

## 2017-11-01 ENCOUNTER — Other Ambulatory Visit: Payer: Self-pay | Admitting: Family

## 2017-11-08 ENCOUNTER — Encounter: Payer: Self-pay | Admitting: Internal Medicine

## 2017-11-08 ENCOUNTER — Encounter: Payer: Self-pay | Admitting: *Deleted

## 2017-11-08 ENCOUNTER — Ambulatory Visit (INDEPENDENT_AMBULATORY_CARE_PROVIDER_SITE_OTHER): Payer: Medicare Other | Admitting: Internal Medicine

## 2017-11-08 VITALS — BP 130/70 | HR 75 | Temp 98.7°F | Ht 64.0 in | Wt 104.0 lb

## 2017-11-08 DIAGNOSIS — B354 Tinea corporis: Secondary | ICD-10-CM

## 2017-11-08 DIAGNOSIS — L299 Pruritus, unspecified: Secondary | ICD-10-CM | POA: Diagnosis not present

## 2017-11-08 MED ORDER — TERBINAFINE HCL 1 % EX CREA: 1.0000 "application " | TOPICAL_CREAM | Freq: Two times a day (BID) | CUTANEOUS | 0 refills | Status: DC

## 2017-11-08 MED ORDER — KETOCONAZOLE 2 % EX SHAM: 1.0000 "application " | MEDICATED_SHAMPOO | CUTANEOUS | 0 refills | Status: DC

## 2017-11-08 NOTE — Patient Instructions (Addendum)
I'm not sure what's happening with your scalp.   Try terbinafine cream on your rash spots on your legs.   Keep your dermatology appointment.  Body Ringworm Body ringworm is an infection of the skin that often causes a ring-shaped rash. Body ringworm can affect any part of your skin. It can spread easily to others. Body ringworm is also called tinea corporis. What are the causes? This condition is caused by funguses called dermatophytes. The condition develops when these funguses grow out of control on the skin. You can get this condition if you touch a person or animal that has it. You can also get it if you share clothing, bedding, towels, or any other object with an infected person or pet. What increases the risk? This condition is more likely to develop in:  Athletes who often make skin-to-skin contact with other athletes, such as wrestlers.  People who share equipment and mats.  People with a weakened immune system.  What are the signs or symptoms? Symptoms of this condition include:  Itchy, raised red spots and bumps.  Red scaly patches.  A ring-shaped rash. The rash may have: ? A clear center. ? Scales or red bumps at its center. ? Redness near its borders. ? Dry and scaly skin on or around it.  How is this diagnosed? This condition can usually be diagnosed with a skin exam. A skin scraping may be taken from the affected area and examined under a microscope to see if the fungus is present. How is this treated? This condition may be treated with:  An antifungal cream or ointment.  An antifungal shampoo.  Antifungal medicines. These may be prescribed if your ringworm is severe, keeps coming back, or lasts a long time.  Follow these instructions at home:  Take over-the-counter and prescription medicines only as told by your health care provider.  If you were given an antifungal cream or ointment: ? Use it as told by your health care provider. ? Wash the infected  area and dry it completely before applying the cream or ointment.  If you were given an antifungal shampoo: ? Use it as told by your health care provider. ? Leave the shampoo on your body for 3-5 minutes before rinsing.  While you have a rash: ? Wear loose clothing to stop clothes from rubbing and irritating it. ? Wash or change your bed sheets every night.  If your pet has the same infection, take your pet to see a Animal nutritionist. How is this prevented?  Practice good hygiene.  Wear sandals or shoes in public places and showers.  Do not share personal items with others.  Avoid touching red patches of skin on other people.  Avoid touching pets that have bald spots.  If you touch an animal that has a bald spot, wash your hands. Contact a health care provider if:  Your rash continues to spread after 7 days of treatment.  Your rash is not gone in 4 weeks.  The area around your rash gets red, warm, tender, and swollen. This information is not intended to replace advice given to you by your health care provider. Make sure you discuss any questions you have with your health care provider. Document Released: 01/31/2000 Document Revised: 07/11/2015 Document Reviewed: 11/29/2014 Elsevier Interactive Patient Education  [employer].

## 2017-11-08 NOTE — Progress Notes (Signed)
Location:  [facility] clinic Provider: Zaelyn Noack L. Mariea Clonts, D.O., C.M.D.  Code Status: DNR; has advance directive but not on file Goals of Care:  Advanced Directives 10/14/2017  Does Patient Have a Medical Advance Directive? No  Would patient like information on creating a medical advance directive? -   Chief Complaint  Patient presents with  . Acute Visit    itching is worse    HPI: Patient is a 82 y.o. female seen today for an acute visit for itching.    Per last two visits on 8/15:  acute visit for evaluation of rash.she states rash started on the head but now spread to the back and armpit.She describes rash as dry and  Itchy.she as used OTC Cortizone cream without any relief.of note she states has a history of psoriasis but has not had it for a very long time.she denies any fever,chills or change in her lotion or detergent. Was prescribed betamethasone 0.1% bid and ketoconazole 2% bid.    8/29:  acute visit for evaluation of rash.she states has used Ketoconazole 2% shampoo twice per week with much improvement but the rash over the body seems to be spreading has new areas on her right forearm.she has applied betamethasone 0.1% without any relief.she states wants to make sure that rash is not shingles or measles.she denies any fever or chills.    Was referred to dermatology.  Her concern is that whatever is on her head is in addition to psoriasis or something else on top of it.  Says her psoriasis does not itch. They are in various stages of recovery.  Head part has never changed. She has her derm appt.  Says betamethasone has not helped.   Sometimes she thinks she gets hives and has very sensitive skin.   She's had a psoriasis diagnosis that involved her right elbow.  It went away after she got divorces.  She has more scaly patches now.  She is using antihistamines as needed and children's benadryl at hs.  Past Medical History:  Diagnosis Date  . Age-related macular degeneration, dry, both  eyes   . Anemia   . Anxiety   . Cholelithiasis   . Endometriosis   . Goiter   . Herpes simplex   . Hyperlipidemia   . Hypertension   . Loss of weight   . Nontoxic multinodular goiter   . Other abnormal blood chemistry   . PUD (peptic ulcer disease)   . Renal cyst   . Sciatica   . Scoliosis (and kyphoscoliosis), idiopathic   . Unspecified disorder of kidney and ureter   . Urinary frequency     Past Surgical History:  Procedure Laterality Date  . ABDOMINAL HYSTERECTOMY  1971  . CATARACT EXTRACTION W/ INTRAOCULAR LENS  IMPLANT, BILATERAL Bilateral 2016   Dr. Rutherford Guys  . IRRIGATION AND DEBRIDEMENT ABSCESS Left 02/23/2017   Procedure: IRRIGATION AND DEBRIDEMENT ABSCESS;  Surgeon: Mcarthur Rossetti, MD;  Location: [facility];  Service: Orthopedics;  Laterality: Left;  . REPAIR OF PERFORATED ULCER  1973 &1975   Dr Vida Rigger  . STOMACH SURGERY  [phone]   removed 1/2 stomach; "had a total of 5 ORs from 1973-1980; all related to perforated ulcer"  . TONSILLECTOMY      No Known Allergies  Outpatient Encounter Medications as of 11/08/2017  Medication Sig  . ALPRAZolam (XANAX) 0.25 MG tablet TAKE 1 TABLET(0.25 MG) BY MOUTH AT BEDTIME AS NEEDED  . betamethasone valerate ointment (VALISONE) 0.1 % Apply 1 application  topically 2 (two) times daily.  Marland Kitchen BYSTOLIC 10 MG tablet TAKE 1/2 TABLET BY MOUTH ONCE DAILY  . EXFORGE 10-320 MG tablet take 1 tablet by mouth once daily   Facility-Administered Encounter Medications as of 11/08/2017  Medication  . ketoconazole (NIZORAL) 2 % shampoo    Review of Systems:  Review of Systems  Constitutional: Negative for chills and fever.  HENT: Negative for congestion and hearing loss.   Eyes: Negative for blurred vision.  Respiratory: Negative for cough and shortness of breath.   Cardiovascular: Negative for chest pain and palpitations.  Gastrointestinal: Negative for abdominal pain.  Genitourinary: Negative for dysuria.  Musculoskeletal:  Negative for falls and joint pain.       Left shoulder is better  Skin: Positive for itching and rash.  Neurological: Negative for dizziness and loss of consciousness.  Endo/Heme/Allergies: Does not bruise/bleed easily.  Psychiatric/Behavioral: Negative for memory loss. The patient is nervous/anxious.     Health Maintenance  Topic Date Due  . FOOT EXAM  10/17/1945  . OPHTHALMOLOGY EXAM  07/17/2017  . HEMOGLOBIN A1C  01/01/2018  . MAMMOGRAM  Discontinued    Physical Exam: Vitals:   11/08/17 1333  BP: 130/70  Pulse: 75  Temp: 98.7 F (37.1 C)  TempSrc: Oral  SpO2: 99%  Weight: 104 lb (47.2 kg)  Height: 5\' 4"  (1.626 m)   Body mass index is 17.85 kg/m. Physical Exam  Constitutional: She is oriented to person, place, and time. She appears well-developed and well-nourished. No distress.  Cardiovascular: Normal rate, regular rhythm, normal heart sounds and intact distal pulses.  Pulmonary/Chest: Effort normal and breath sounds normal. No respiratory distress.  Abdominal: Bowel sounds are normal.  Musculoskeletal: Normal range of motion.  Neurological: She is alert and oriented to person, place, and time.  Skin: Skin is warm and dry.  Palpable raised areas on scalp, right lateral lower leg with nickel-sized area with red raised borders that is scaling off; two red papules on thighs also that now look more like scarred areas  Psychiatric: She has a normal mood and affect.  anxious    Labs reviewed: Basic Metabolic Panel: Recent Labs    02/24/17 0515 02/26/17 0552 03/15/17 1345 07/01/17 0815  NA 135 136 135 139  K 4.1 4.4 4.8 4.9  CL 101 103 103 106  CO2 25 25 22 25   GLUCOSE 200* 94 71 112*  BUN 17 22* 24 21  CREATININE 1.04* 0.95 1.00* 1.17*  CALCIUM 7.6* 7.6* 8.6 9.8  MG 1.5*  --   --   --   TSH  --   --   --  0.64   Liver Function Tests: Recent Labs    02/22/17 2016 03/15/17 1345 07/01/17 0815  AST 25 28 21   ALT 21 19 14   ALKPHOS 138*  --   --   BILITOT  0.9 0.2 0.3  PROT 7.1 6.6 6.9  ALBUMIN 2.4*  --   --    No results for input(s): LIPASE, AMYLASE in the last 8760 hours. No results for input(s): AMMONIA in the last 8760 hours. CBC: Recent Labs    02/25/17 0512 02/26/17 0552 03/15/17 1345 07/01/17 0815  WBC 22.2* 16.3* 5.2 7.7  NEUTROABS 19.1*  --  3,021 3,504  HGB 8.4* 8.5* 8.5* 9.9*  HCT 27.2* 26.6* 26.5* 30.8*  MCV 81.9 82.4 81.5 75.3*  PLT 493* 508* 555* 346   Lipid Panel: Recent Labs    07/01/17 0815  CHOL 216*  HDL 132  LDLCALC 67  TRIG 90  CHOLHDL 1.6   Lab Results  Component Value Date   HGBA1C 6.4 (H) 07/01/2017    Assessment/Plan 1. Tinea corporis - pt previously suspected to have psoriasis b/c she did before years ago - area on lower leg looks like ringworm so will try to treat that way since betamethasone has not helped at all - terbinafine (LAMISIL) 1 % cream; Apply 1 application topically 2 (two) times daily.  Dispense: 30 g; Refill: 0  2. Itchy scalp - unclear cause--I see nothing, but do feel the bumps at two locations she pointed out to me -was to get shampoo last time, but it was not sent to pharmacy--will try her on this, but there is no visible scaling to suggest psoriasis--says it feels like there are bugs - ketoconazole (NIZORAL) 2 % shampoo; Apply 1 application topically 2 (two) times a week.  Dispense: 120 mL; Refill: 0 -keep derm appt   Labs/tests ordered:  No orders of the defined types were placed in this encounter.  Next appt:  01/03/2018  Zackrey Dyar L. Cerrone Debold, D.O. [facility] [address], [place] [address] Cell Phone (Mon-Fri 8am-5pm):  [phone] On Call:  [phone] & follow prompts after 5pm & weekends Office Phone:  [phone] Office Fax:  [phone]

## 2017-11-17 ENCOUNTER — Other Ambulatory Visit: Payer: Self-pay | Admitting: Internal Medicine

## 2017-11-17 DIAGNOSIS — I10 Essential (primary) hypertension: Secondary | ICD-10-CM

## 2017-12-01 ENCOUNTER — Other Ambulatory Visit: Payer: Self-pay | Admitting: Internal Medicine

## 2017-12-01 DIAGNOSIS — D229 Melanocytic nevi, unspecified: Secondary | ICD-10-CM | POA: Diagnosis not present

## 2017-12-01 DIAGNOSIS — L299 Pruritus, unspecified: Secondary | ICD-10-CM | POA: Diagnosis not present

## 2017-12-01 NOTE — Telephone Encounter (Signed)
[place] Database verified and compliance confirmed, per Karena Addison (Dr.Reed's assistant)

## 2017-12-27 DIAGNOSIS — L409 Psoriasis, unspecified: Secondary | ICD-10-CM | POA: Diagnosis not present

## 2017-12-27 DIAGNOSIS — R208 Other disturbances of skin sensation: Secondary | ICD-10-CM | POA: Diagnosis not present

## 2018-01-01 ENCOUNTER — Other Ambulatory Visit: Payer: Self-pay | Admitting: Internal Medicine

## 2018-01-03 ENCOUNTER — Other Ambulatory Visit: Payer: Medicare Other

## 2018-01-03 DIAGNOSIS — D508 Other iron deficiency anemias: Secondary | ICD-10-CM | POA: Diagnosis not present

## 2018-01-03 DIAGNOSIS — Z0000 Encounter for general adult medical examination without abnormal findings: Secondary | ICD-10-CM | POA: Diagnosis not present

## 2018-01-03 DIAGNOSIS — I10 Essential (primary) hypertension: Secondary | ICD-10-CM

## 2018-01-03 DIAGNOSIS — R739 Hyperglycemia, unspecified: Secondary | ICD-10-CM

## 2018-01-03 NOTE — Telephone Encounter (Signed)
A medication refill was received from pharmacy for alprazolam 0.25 mg. Rx was called in to pharmacy after verifying last fill date, provider, and quantity on PMP [place].

## 2018-01-04 LAB — CBC WITH DIFFERENTIAL/PLATELET
Basophils Absolute: 58 cells/uL (ref 0–200)
Basophils Relative: 0.8 %
Eosinophils Absolute: 51 cells/uL (ref 15–500)
Eosinophils Relative: 0.7 %
HCT: 32.2 % — ABNORMAL LOW (ref 35.0–45.0)
Hemoglobin: 10.1 g/dL — ABNORMAL LOW (ref 11.7–15.5)
Lymphs Abs: 2168 cells/uL (ref 850–3900)
MCH: 25.7 pg — ABNORMAL LOW (ref 27.0–33.0)
MCHC: 31.4 g/dL — ABNORMAL LOW (ref 32.0–36.0)
MCV: 81.9 fL (ref 80.0–100.0)
MPV: 10.6 fL (ref 7.5–12.5)
Monocytes Relative: 6.5 %
Neutro Abs: 4548 cells/uL (ref 1500–7800)
Neutrophils Relative %: 62.3 %
Platelets: 318 10*3/uL (ref 140–400)
RBC: 3.93 10*6/uL (ref 3.80–5.10)
RDW: 14.9 % (ref 11.0–15.0)
Total Lymphocyte: 29.7 %
WBC mixed population: 475 cells/uL (ref 200–950)
WBC: 7.3 10*3/uL (ref 3.8–10.8)

## 2018-01-04 LAB — COMPLETE METABOLIC PANEL WITH GFR
AG Ratio: 1.7 (calc) (ref 1.0–2.5)
ALT: 10 U/L (ref 6–29)
AST: 18 U/L (ref 10–35)
Albumin: 4.5 g/dL (ref 3.6–5.1)
Alkaline phosphatase (APISO): 93 U/L (ref 33–130)
BUN/Creatinine Ratio: 16 (calc) (ref 6–22)
BUN: 21 mg/dL (ref 7–25)
CO2: 22 mmol/L (ref 20–32)
Calcium: 9.6 mg/dL (ref 8.6–10.4)
Chloride: 105 mmol/L (ref 98–110)
Creat: 1.33 mg/dL — ABNORMAL HIGH (ref 0.60–0.88)
GFR, Est African American: 44 mL/min/{1.73_m2} — ABNORMAL LOW (ref >=60)
GFR, Est Non African American: 38 mL/min/{1.73_m2} — ABNORMAL LOW (ref >=60)
Globulin: 2.6 g/dL (calc) (ref 1.9–3.7)
Glucose, Bld: 81 mg/dL (ref 65–99)
Potassium: 4.3 mmol/L (ref 3.5–5.3)
Sodium: 139 mmol/L (ref 135–146)
Total Bilirubin: 0.4 mg/dL (ref 0.2–1.2)
Total Protein: 7.1 g/dL (ref 6.1–8.1)

## 2018-01-04 LAB — HEMOGLOBIN A1C
Hgb A1c MFr Bld: 5.8 % of total Hgb — ABNORMAL HIGH (ref <5.7)
Mean Plasma Glucose: 120 (calc)
eAG (mmol/L): 6.6 (calc)

## 2018-01-10 ENCOUNTER — Ambulatory Visit (INDEPENDENT_AMBULATORY_CARE_PROVIDER_SITE_OTHER): Payer: Medicare Other | Admitting: Internal Medicine

## 2018-01-10 ENCOUNTER — Encounter: Payer: Self-pay | Admitting: Internal Medicine

## 2018-01-10 VITALS — BP 132/70 | HR 77 | Temp 97.7°F | Ht 64.0 in | Wt 107.0 lb

## 2018-01-10 DIAGNOSIS — L299 Pruritus, unspecified: Secondary | ICD-10-CM | POA: Insufficient documentation

## 2018-01-10 DIAGNOSIS — I10 Essential (primary) hypertension: Secondary | ICD-10-CM | POA: Diagnosis not present

## 2018-01-10 DIAGNOSIS — R739 Hyperglycemia, unspecified: Secondary | ICD-10-CM

## 2018-01-10 DIAGNOSIS — D508 Other iron deficiency anemias: Secondary | ICD-10-CM | POA: Diagnosis not present

## 2018-01-10 DIAGNOSIS — D509 Iron deficiency anemia, unspecified: Secondary | ICD-10-CM | POA: Insufficient documentation

## 2018-01-10 DIAGNOSIS — F419 Anxiety disorder, unspecified: Secondary | ICD-10-CM | POA: Diagnosis not present

## 2018-01-10 DIAGNOSIS — Z13220 Encounter for screening for lipoid disorders: Secondary | ICD-10-CM

## 2018-01-10 NOTE — Progress Notes (Signed)
Location:  [facility] clinic Provider:  Tyrin Herbers L. Mariea Clonts, D.O., C.M.D.  Code Status: DNR; for some reason, we have no copies of any of her documents though she has requested DNR historically  Goals of Care:  Advanced Directives 10/14/2017  Does Patient Have a Medical Advance Directive? No  Would patient like information on creating a medical advance directive? -   Chief Complaint  Patient presents with  . Medical Management of Chronic Issues    40mth follow-up    HPI: Patient is a 82 y.o. female seen today for medical management of chronic diseases.    Basically doing well.  Saw Dr. Denna Haggard about her itching on her head.  It's more of a sensation on her head that takes different forms.  He said she had Bowen's disease on her legs.  He prescribed clobetasol propionate on her scalp.  She can taste it and it makes her lethargic.  She's no longer using ointments or shampoo.  She's dealing with it and looking for alternative ways to handle it with relaxation, etc.    DMII:   Lab Results  Component Value Date   HGBA1C 5.8 (H) 01/03/2018  eye exam:  Has not been recently.  Had cataract surgery 2 yrs ago and for f/u after the surgery.  No changes she notices.  She does have macular degeneration, but does not want to pay $40 for another appt with ophtho.  Her sugar average has gotten much better, but she has no idea why.    Has stopped her night time driving. Wonders about affordable transportation.  Asks about transportation options b/c does not want to drive at night.    BP at goal.  She is hydrating well.  Renal function has declined, but also has been on exforge now and bystolic for bp control.  Monitoring.  Left shoulder ROM is 95% she says.  For any practical purposes, she's not bothered by it.   It did take 5 mos but then got much better.  Has mild anemia but it's improved.    Past Medical History:  Diagnosis Date  . Age-related macular degeneration, dry, both eyes   . Anemia   .  Anxiety   . Cholelithiasis   . Endometriosis   . Goiter   . Herpes simplex   . Hyperlipidemia   . Hypertension   . Loss of weight   . Nontoxic multinodular goiter   . Other abnormal blood chemistry   . PUD (peptic ulcer disease)   . Renal cyst   . Sciatica   . Scoliosis (and kyphoscoliosis), idiopathic   . Unspecified disorder of kidney and ureter   . Urinary frequency     Past Surgical History:  Procedure Laterality Date  . ABDOMINAL HYSTERECTOMY  1971  . CATARACT EXTRACTION W/ INTRAOCULAR LENS  IMPLANT, BILATERAL Bilateral 2016   Dr. Rutherford Guys  . IRRIGATION AND DEBRIDEMENT ABSCESS Left 02/23/2017   Procedure: IRRIGATION AND DEBRIDEMENT ABSCESS;  Surgeon: Mcarthur Rossetti, MD;  Location: [facility];  Service: Orthopedics;  Laterality: Left;  . REPAIR OF PERFORATED ULCER  1973 &1975   Dr Vida Rigger  . STOMACH SURGERY  [phone]   removed 1/2 stomach; "had a total of 5 ORs from 1973-1980; all related to perforated ulcer"  . TONSILLECTOMY      No Known Allergies  Outpatient Encounter Medications as of 01/10/2018  Medication Sig  . ALPRAZolam (XANAX) 0.25 MG tablet TAKE 1 TABLET BY MOUTH AT BEDTIME AS NEEDED  . betamethasone  valerate ointment (VALISONE) 0.1 % Apply 1 application topically 2 (two) times daily.  Marland Kitchen BYSTOLIC 10 MG tablet TAKE 1/2 TABLET BY MOUTH ONCE DAILY  . EXFORGE 10-320 MG tablet TAKE 1 TABLET BY MOUTH ONCE DAILY  . [DISCONTINUED] ketoconazole (NIZORAL) 2 % shampoo Apply 1 application topically 2 (two) times a week.  . [DISCONTINUED] terbinafine (LAMISIL) 1 % cream Apply 1 application topically 2 (two) times daily.   No facility-administered encounter medications on file as of 01/10/2018.     Review of Systems:  Review of Systems  Constitutional: Negative for chills, fever and malaise/fatigue.  HENT: Negative for congestion and hearing loss.   Eyes: Negative for blurred vision.  Respiratory: Negative for cough and shortness of breath.   Cardiovascular:  Negative for chest pain, palpitations and leg swelling.  Gastrointestinal: Negative for abdominal pain, blood in stool, constipation, diarrhea and melena.  Genitourinary: Negative for dysuria.       H/o herpes  Musculoskeletal: Negative for falls and joint pain.  Skin: Positive for itching. Negative for rash.  Neurological: Negative for dizziness and loss of consciousness.  Endo/Heme/Allergies: Does not bruise/bleed easily.  Psychiatric/Behavioral: Negative for depression and memory loss. The patient is nervous/anxious. The patient does not have insomnia.     Health Maintenance  Topic Date Due  . FOOT EXAM  10/17/1945  . OPHTHALMOLOGY EXAM  07/17/2017  . HEMOGLOBIN A1C  07/04/2018  . MAMMOGRAM  Discontinued    Physical Exam: Vitals:   01/10/18 1310  BP: 132/70  Pulse: 77  Temp: 97.7 F (36.5 C)  TempSrc: Oral  SpO2: 99%  Weight: 107 lb (48.5 kg)  Height: 5\' 4"  (1.626 m)   Body mass index is 18.37 kg/m. Physical Exam  Constitutional: She is oriented to person, place, and time. No distress.  Thin female  HENT:  Head: Normocephalic and atraumatic.  Cardiovascular: Normal rate, regular rhythm, normal heart sounds and intact distal pulses.  Pulmonary/Chest: Effort normal and breath sounds normal. No respiratory distress.  Abdominal: Bowel sounds are normal.  Musculoskeletal: Normal range of motion.  Neurological: She is alert and oriented to person, place, and time.  Skin: Skin is warm and dry. There is pallor.  Psychiatric: She has a normal mood and affect.    Labs reviewed: Basic Metabolic Panel: Recent Labs    02/24/17 0515  03/15/17 1345 07/01/17 0815 01/03/18 1348  NA 135   < > 135 139 139  K 4.1   < > 4.8 4.9 4.3  CL 101   < > 103 106 105  CO2 25   < > 22 25 22   GLUCOSE 200*   < > 71 112* 81  BUN 17   < > 24 21 21   CREATININE 1.04*   < > 1.00* 1.17* 1.33*  CALCIUM 7.6*   < > 8.6 9.8 9.6  MG 1.5*  --   --   --   --   TSH  --   --   --  0.64  --    < >  = values in this interval not displayed.   Liver Function Tests: Recent Labs    02/22/17 2016 03/15/17 1345 07/01/17 0815 01/03/18 1348  AST 25 28 21 18   ALT 21 19 14 10   ALKPHOS 138*  --   --   --   BILITOT 0.9 0.2 0.3 0.4  PROT 7.1 6.6 6.9 7.1  ALBUMIN 2.4*  --   --   --    No results for  input(s): LIPASE, AMYLASE in the last 8760 hours. No results for input(s): AMMONIA in the last 8760 hours. CBC: Recent Labs    03/15/17 1345 07/01/17 0815 01/03/18 1348  WBC 5.2 7.7 7.3  NEUTROABS 3,021 3,504 4,548  HGB 8.5* 9.9* 10.1*  HCT 26.5* 30.8* 32.2*  MCV 81.5 75.3* 81.9  PLT 555* 346 318   Lipid Panel: Recent Labs    07/01/17 0815  CHOL 216*  HDL 132  LDLCALC 67  TRIG 90  CHOLHDL 1.6   Lab Results  Component Value Date   HGBA1C 5.8 (H) 01/03/2018    Assessment/Plan 1. Essential hypertension, benign -bp improved with bystolic and exforge, cont same regimen  2. Hyperglycemia - has improved also, monitor - COMPLETE METABOLIC PANEL WITH GFR; Future - Hemoglobin A1c; Future  3. Anxiety -ongoing, may have large role in #4, is trying to follow some nonconventional techniques to control #4  4. Itchy scalp -continues clobetametasol prep from her dermatologist that may be helping some, but mostly using nonconventional approaches, no rash is visible to providers  5. Iron deficiency anemia secondary to inadequate dietary iron intake -anemia has improved considerably since shoulder surgery -f/u labs - CBC with Differential/Platelet; Future  6. Screening, lipid - r/u lipids for the year - Lipid panel; Future  Labs/tests ordered:   Orders Placed This Encounter  Procedures  . CBC with Differential/Platelet    Standing Status:   Future    Standing Expiration Date:   01/11/2019  . COMPLETE METABOLIC PANEL WITH GFR    Standing Status:   Future    Standing Expiration Date:   01/11/2019  . Hemoglobin A1c    Standing Status:   Future    Standing Expiration Date:    01/11/2019  . Lipid panel    Standing Status:   Future    Standing Expiration Date:   01/11/2019   Next appt:  07/04/2018 for CPE, fasting labs before    Azavier Creson L. Jahir Halt, D.O. [facility] [address], [place] [address] Cell Phone (Mon-Fri 8am-5pm):  [phone] On Call:  [phone] & follow prompts after 5pm & weekends Office Phone:  [phone] Office Fax:  [phone]

## 2018-01-15 MED ORDER — CLOBETASOL PROPIONATE 0.05 % EX LIQD: Freq: Two times a day (BID) | CUTANEOUS | 0 refills | Status: DC

## 2018-01-17 ENCOUNTER — Other Ambulatory Visit: Payer: Self-pay | Admitting: Internal Medicine

## 2018-01-17 DIAGNOSIS — I10 Essential (primary) hypertension: Secondary | ICD-10-CM

## 2018-02-01 ENCOUNTER — Other Ambulatory Visit: Payer: Self-pay | Admitting: Internal Medicine

## 2018-02-01 NOTE — Telephone Encounter (Signed)
rx called into pharmacy

## 2018-02-28 ENCOUNTER — Ambulatory Visit (INDEPENDENT_AMBULATORY_CARE_PROVIDER_SITE_OTHER): Payer: Medicare Other | Admitting: Nurse Practitioner

## 2018-02-28 ENCOUNTER — Encounter: Payer: Self-pay | Admitting: Nurse Practitioner

## 2018-02-28 VITALS — BP 134/72 | HR 70 | Temp 97.5°F | Ht 64.0 in | Wt 106.2 lb

## 2018-02-28 DIAGNOSIS — F419 Anxiety disorder, unspecified: Secondary | ICD-10-CM

## 2018-02-28 DIAGNOSIS — F329 Major depressive disorder, single episode, unspecified: Secondary | ICD-10-CM | POA: Diagnosis not present

## 2018-02-28 DIAGNOSIS — L299 Pruritus, unspecified: Secondary | ICD-10-CM | POA: Diagnosis not present

## 2018-02-28 DIAGNOSIS — F32A Depression, unspecified: Secondary | ICD-10-CM

## 2018-02-28 MED ORDER — DULOXETINE HCL 30 MG PO CPEP: 30.0000 mg | ORAL_CAPSULE | Freq: Every day | ORAL | 3 refills | Status: DC

## 2018-02-28 NOTE — Progress Notes (Signed)
Careteam: Patient Care Team: Gayland Curry, DO as PCP - General (Geriatric Medicine) Alden Hipp, MD as Consulting Physician (Obstetrics and Gynecology) Rutherford Guys, MD as Consulting Physician (Ophthalmology)  Advanced Directive information Does Patient Have a Medical Advance Directive?: Yes, Does patient want to make changes to medical advance directive?: No - Patient declined  No Known Allergies  Chief Complaint  Patient presents with  . Acute Visit    scalp itching really bad seen dermatologist      HPI: Patient is a 83 y.o. female seen in the office today  Reports she has had itching in her head/scalp and all over and saw dermatologist.  Dermatologist related it to nerve endings. Medication he has been prescribing has not helped. Topical is not effective.  Taking children's benadryl  Reports when she is really still it gets better.  always has an awareness but not in a flare up mode now.  States she can not live with this and willing to seek counseling in regards to it.  Getting anxious and depressed over this.  Taking xanax already.  No visible rash.  Has been going on for ~1 year  Review of Systems:  Review of Systems  Constitutional: Negative for chills and fever.  Skin: Positive for itching. Negative for rash.  Psychiatric/Behavioral: Positive for depression. Negative for hallucinations and suicidal ideas. The patient is nervous/anxious and has insomnia.     Past Medical History:  Diagnosis Date  . Age-related macular degeneration, dry, both eyes   . Anemia   . Anxiety   . Cholelithiasis   . Endometriosis   . Goiter   . Herpes simplex   . Hyperlipidemia   . Hypertension   . Loss of weight   . Nontoxic multinodular goiter   . Other abnormal blood chemistry   . PUD (peptic ulcer disease)   . Renal cyst   . Sciatica   . Scoliosis (and kyphoscoliosis), idiopathic   . Unspecified disorder of kidney and ureter   . Urinary frequency    Past  Surgical History:  Procedure Laterality Date  . ABDOMINAL HYSTERECTOMY  1971  . CATARACT EXTRACTION W/ INTRAOCULAR LENS  IMPLANT, BILATERAL Bilateral 2016   Dr. Rutherford Guys  . IRRIGATION AND DEBRIDEMENT ABSCESS Left 02/23/2017   Procedure: IRRIGATION AND DEBRIDEMENT ABSCESS;  Surgeon: Mcarthur Rossetti, MD;  Location: [facility];  Service: Orthopedics;  Laterality: Left;  . REPAIR OF PERFORATED ULCER  1973 &1975   Dr Vida Rigger  . STOMACH SURGERY  [phone]   removed 1/2 stomach; "had a total of 5 ORs from 1973-1980; all related to perforated ulcer"  . TONSILLECTOMY     Social History:   reports that she quit smoking about 24 years ago. Her smoking use included cigarettes. She has a 40.00 pack-year smoking history. She has never used smokeless tobacco. She reports that she does not drink alcohol or use drugs.  Family History  Problem Relation Age of Onset  . Obesity Mother     Medications: Patient's Medications  New Prescriptions   DULOXETINE (CYMBALTA) 30 MG CAPSULE    Take 1 capsule (30 mg total) by mouth daily.  Previous Medications   ALPRAZOLAM (XANAX) 0.25 MG TABLET    TAKE 1 TABLET BY MOUTH EVERY NIGHT AT BEDTIME   BYSTOLIC 10 MG TABLET    TAKE 1/2 TABLET BY MOUTH ONCE DAILY   CLOBETASOL PROPIONATE (TEMOVATE) 0.05 % EXTERNAL SPRAY    Apply topically 2 (two) times daily.   EXFORGE  10-320 MG TABLET    TAKE 1 TABLET BY MOUTH ONCE DAILY  Modified Medications   No medications on file  Discontinued Medications   BYSTOLIC 10 MG TABLET    TAKE 1/2 TABLET BY MOUTH ONCE DAILY     Physical Exam:  Vitals:   02/28/18 1558  BP: 134/72  Pulse: 70  Temp: (!) 97.5 F (36.4 C)  TempSrc: Oral  SpO2: 98%  Weight: 106 lb 3.2 oz (48.2 kg)  Height: 5\' 4"  (1.626 m)   Body mass index is 18.23 kg/m.  Physical Exam Constitutional:      General: She is not in acute distress.    Comments: Thin female  HENT:     Head: Normocephalic and atraumatic.  Cardiovascular:     Rate and Rhythm:  Normal rate and regular rhythm.     Heart sounds: Normal heart sounds.  Pulmonary:     Effort: Pulmonary effort is normal. No respiratory distress.     Breath sounds: Normal breath sounds.  Abdominal:     General: Abdomen is flat. Bowel sounds are normal.     Palpations: Abdomen is soft.  Musculoskeletal: Normal range of motion.  Skin:    General: Skin is warm and dry.     Coloration: Skin is pale.     Findings: No lesion or rash.  Neurological:     Mental Status: She is alert and oriented to person, place, and time.     Labs reviewed: Basic Metabolic Panel: Recent Labs    03/15/17 1345 07/01/17 0815 01/03/18 1348  NA 135 139 139  K 4.8 4.9 4.3  CL 103 106 105  CO2 22 25 22   GLUCOSE 71 112* 81  BUN 24 21 21   CREATININE 1.00* 1.17* 1.33*  CALCIUM 8.6 9.8 9.6  TSH  --  0.64  --    Liver Function Tests: Recent Labs    03/15/17 1345 07/01/17 0815 01/03/18 1348  AST 28 21 18   ALT 19 14 10   BILITOT 0.2 0.3 0.4  PROT 6.6 6.9 7.1   No results for input(s): LIPASE, AMYLASE in the last 8760 hours. No results for input(s): AMMONIA in the last 8760 hours. CBC: Recent Labs    03/15/17 1345 07/01/17 0815 01/03/18 1348  WBC 5.2 7.7 7.3  NEUTROABS 3,021 3,504 4,548  HGB 8.5* 9.9* 10.1*  HCT 26.5* 30.8* 32.2*  MCV 81.5 75.3* 81.9  PLT 555* 346 318   Lipid Panel: Recent Labs    07/01/17 0815  CHOL 216*  HDL 132  LDLCALC 67  TRIG 90  CHOLHDL 1.6   TSH: Recent Labs    07/01/17 0815  TSH 0.64   A1C: Lab Results  Component Value Date   HGBA1C 5.8 (H) 01/03/2018     Assessment/Plan 1. Anxiety and depression - worsening anxiety and depression. Feels like now itching could be a manifestation of this.  -continues xanax as needed, will start cymbalta at this time.  -information also given for counseling services, she will call and make appt.   - DULoxetine (CYMBALTA) 30 MG capsule; Take 1 capsule (30 mg total) by mouth daily.  Dispense: 30 capsule;  Refill: 3  2. Pruritus -ongoing follow up with nothing that has helped through dermatology and [place]. Lab work unremarkable.   Next appt: 1 month for follow up anxiety/depression.  Carlos American. [place], [facility] [phone]

## 2018-02-28 NOTE — Patient Instructions (Signed)
To start Cymbalta 30 mg by mouth daily to help with nerves  Follow up with Dr Mariea Clonts or Janett Billow in 1 month.

## 2018-03-07 ENCOUNTER — Telehealth: Payer: Self-pay | Admitting: *Deleted

## 2018-03-07 NOTE — Telephone Encounter (Signed)
Pt walked in and dropped off letter.  " I am writing this note because it feels too long to leave as a message. During an appt with jessica on Monday 02/28/18 she prescribed DULOXETINE. This medication is not agreeing with me. I would prefer to continue with the ALPRAZOLAM. I am at a loss on how to mange this itching issue. If you have any other suggestions or questions please contact me. [name]"

## 2018-03-07 NOTE — Telephone Encounter (Signed)
Ok, how it is not agreeing with her?  Can we call to check please?

## 2018-03-08 ENCOUNTER — Other Ambulatory Visit: Payer: Self-pay | Admitting: Internal Medicine

## 2018-03-08 NOTE — Telephone Encounter (Signed)
.  left message to have patient return my call.  

## 2018-03-08 NOTE — Telephone Encounter (Signed)
Ok to renew her alprazolam.

## 2018-03-08 NOTE — Telephone Encounter (Signed)
Patient called and left message on Clinical Intake stating someone had called her and she was returning their call.   Tried calling patient back. LMOM to return call.

## 2018-03-08 NOTE — Telephone Encounter (Signed)
Patient called back and stated that the Duloxetine is causing upset stomach, sour stomach, digestive track all off. Stated that it did not help any with her itching.  Wants refill on the Alprazolam. Stated that this medication is working the best.  Please Advise.

## 2018-03-09 MED ORDER — ALPRAZOLAM 0.25 MG PO TABS: 0.2500 mg | ORAL_TABLET | Freq: Every day | ORAL | 0 refills | Status: DC

## 2018-03-09 NOTE — Telephone Encounter (Signed)
Medication phoned to pharmacy. Medication list updated. Patient notified.

## 2018-03-17 ENCOUNTER — Other Ambulatory Visit: Payer: Self-pay | Admitting: Internal Medicine

## 2018-03-17 DIAGNOSIS — I10 Essential (primary) hypertension: Secondary | ICD-10-CM

## 2018-03-18 ENCOUNTER — Other Ambulatory Visit: Payer: Self-pay | Admitting: Internal Medicine

## 2018-03-18 DIAGNOSIS — I10 Essential (primary) hypertension: Secondary | ICD-10-CM

## 2018-04-04 ENCOUNTER — Encounter: Payer: Self-pay | Admitting: Nurse Practitioner

## 2018-04-04 ENCOUNTER — Ambulatory Visit (INDEPENDENT_AMBULATORY_CARE_PROVIDER_SITE_OTHER): Payer: Medicare Other | Admitting: Nurse Practitioner

## 2018-04-04 ENCOUNTER — Ambulatory Visit: Payer: Medicare Other | Admitting: Internal Medicine

## 2018-04-04 VITALS — BP 140/70 | HR 72 | Temp 97.5°F | Ht 64.0 in | Wt 108.8 lb

## 2018-04-04 DIAGNOSIS — F32A Depression, unspecified: Secondary | ICD-10-CM

## 2018-04-04 DIAGNOSIS — F419 Anxiety disorder, unspecified: Secondary | ICD-10-CM | POA: Diagnosis not present

## 2018-04-04 DIAGNOSIS — L299 Pruritus, unspecified: Secondary | ICD-10-CM

## 2018-04-04 DIAGNOSIS — F329 Major depressive disorder, single episode, unspecified: Secondary | ICD-10-CM

## 2018-04-04 NOTE — Patient Instructions (Signed)
To make appt with psychiatrist and psychologist   Referral placed for neurologist at this time to see if they can offer any assistance.

## 2018-04-04 NOTE — Progress Notes (Signed)
Careteam: Patient Care Team: Gayland Curry, DO as PCP - General (Geriatric Medicine) Alden Hipp, MD as Consulting Physician (Obstetrics and Gynecology) Rutherford Guys, MD as Consulting Physician (Ophthalmology)  Advanced Directive information Does Patient Have a Medical Advance Directive?: No;Yes, Type of Advance Directive: [place];Living will, Does patient want to make changes to medical advance directive?: No - Patient declined  No Known Allergies  Chief Complaint  Patient presents with  . Medical Management of Chronic Issues    1 month follow up      HPI: Patient is a 83 y.o. female seen in the office today for 1 month follow up. She started on Cymbalta 30 mg by mouth daily due to anxiety/depression and pruritus. She was unable to tolerate medication due nausea/fatigue.  Using alprazolam which has been most effective.  Wants to go to specialist for nerves because dermatologist said that this could be due to nerve endings in her head.  Did not make appt with psych services. States just picking one out seems very cold.  Feels like there is an emotional component with the itching.    Review of Systems:  Review of Systems  Constitutional: Negative for chills and fever.  Skin: Positive for itching. Negative for rash.  Psychiatric/Behavioral: Positive for depression. Negative for hallucinations and suicidal ideas. The patient is nervous/anxious and has insomnia.     Past Medical History:  Diagnosis Date  . Age-related macular degeneration, dry, both eyes   . Anemia   . Anxiety   . Cholelithiasis   . Endometriosis   . Goiter   . Herpes simplex   . Hyperlipidemia   . Hypertension   . Loss of weight   . Nontoxic multinodular goiter   . Other abnormal blood chemistry   . PUD (peptic ulcer disease)   . Renal cyst   . Sciatica   . Scoliosis (and kyphoscoliosis), idiopathic   . Unspecified disorder of kidney and ureter   . Urinary frequency     Past Surgical History:  Procedure Laterality Date  . ABDOMINAL HYSTERECTOMY  1971  . CATARACT EXTRACTION W/ INTRAOCULAR LENS  IMPLANT, BILATERAL Bilateral 2016   Dr. Rutherford Guys  . IRRIGATION AND DEBRIDEMENT ABSCESS Left 02/23/2017   Procedure: IRRIGATION AND DEBRIDEMENT ABSCESS;  Surgeon: Mcarthur Rossetti, MD;  Location: [facility];  Service: Orthopedics;  Laterality: Left;  . REPAIR OF PERFORATED ULCER  1973 &1975   Dr Vida Rigger  . STOMACH SURGERY  [phone]   removed 1/2 stomach; "had a total of 5 ORs from 1973-1980; all related to perforated ulcer"  . TONSILLECTOMY     Social History:   reports that she quit smoking about 24 years ago. Her smoking use included cigarettes. She has a 40.00 pack-year smoking history. She has never used smokeless tobacco. She reports that she does not drink alcohol or use drugs.  Family History  Problem Relation Age of Onset  . Obesity Mother     Medications: Patient's Medications  New Prescriptions   No medications on file  Previous Medications   ALPRAZOLAM (XANAX) 0.25 MG TABLET    Take 1 tablet (0.25 mg total) by mouth at bedtime.   BYSTOLIC 10 MG TABLET    TAKE 1/2 TABLET BY MOUTH ONCE DAILY   EXFORGE 10-320 MG TABLET    TAKE 1 TABLET BY MOUTH ONCE DAILY  Modified Medications   No medications on file  Discontinued Medications   CLOBETASOL PROPIONATE (TEMOVATE) 0.05 % EXTERNAL SPRAY  Apply topically 2 (two) times daily.     Physical Exam:  Vitals:   04/04/18 1555  BP: 140/70  Pulse: 72  Temp: (!) 97.5 F (36.4 C)  TempSrc: Oral  SpO2: 99%  Weight: 108 lb 12.8 oz (49.4 kg)  Height: 5\' 4"  (1.626 m)   Body mass index is 18.68 kg/m.  Physical Exam Constitutional:      General: She is not in acute distress.    Comments: Thin female  HENT:     Head: Normocephalic and atraumatic.  Cardiovascular:     Rate and Rhythm: Normal rate and regular rhythm.     Heart sounds: Normal heart sounds.  Pulmonary:     Effort: Pulmonary  effort is normal. No respiratory distress.     Breath sounds: Normal breath sounds.  Abdominal:     General: Abdomen is flat. Bowel sounds are normal.     Palpations: Abdomen is soft.  Musculoskeletal: Normal range of motion.  Skin:    General: Skin is warm and dry.     Coloration: Skin is pale.     Findings: No erythema, lesion or rash.  Neurological:     Mental Status: She is alert and oriented to person, place, and time.    Labs reviewed: Basic Metabolic Panel: Recent Labs    07/01/17 0815 01/03/18 1348  NA 139 139  K 4.9 4.3  CL 106 105  CO2 25 22  GLUCOSE 112* 81  BUN 21 21  CREATININE 1.17* 1.33*  CALCIUM 9.8 9.6  TSH 0.64  --    Liver Function Tests: Recent Labs    07/01/17 0815 01/03/18 1348  AST 21 18  ALT 14 10  BILITOT 0.3 0.4  PROT 6.9 7.1   No results for input(s): LIPASE, AMYLASE in the last 8760 hours. No results for input(s): AMMONIA in the last 8760 hours. CBC: Recent Labs    07/01/17 0815 01/03/18 1348  WBC 7.7 7.3  NEUTROABS 3,504 4,548  HGB 9.9* 10.1*  HCT 30.8* 32.2*  MCV 75.3* 81.9  PLT 346 318   Lipid Panel: Recent Labs    07/01/17 0815  CHOL 216*  HDL 132  LDLCALC 67  TRIG 90  CHOLHDL 1.6   TSH: Recent Labs    07/01/17 0815  TSH 0.64   A1C: Lab Results  Component Value Date   HGBA1C 5.8 (H) 01/03/2018     Assessment/Plan 1. Pruritus -no rash noted. States dermatology reported there could be nerve ending involvement and would like neurology referral for ongoing evaluation.  - Ambulatory referral to Neurology  2. Anxiety and depression -could not tolerate Cymbalta. No SI or HI. Reports she feels like anxiety is playing a role in itching. Continues on xanax as needed. Encouraged to establish with psych services at this time. Has information of providers to contact and states she will look into that. Discuss need for psychologist as well as psychiatrist.   Next appt: 06/29/2018, and as needed before.  Carlos American.  [place], [facility] [phone]

## 2018-04-06 ENCOUNTER — Other Ambulatory Visit: Payer: Self-pay | Admitting: Internal Medicine

## 2018-04-12 ENCOUNTER — Encounter: Payer: Self-pay | Admitting: Neurology

## 2018-05-02 DIAGNOSIS — R208 Other disturbances of skin sensation: Secondary | ICD-10-CM | POA: Diagnosis not present

## 2018-05-04 ENCOUNTER — Other Ambulatory Visit: Payer: Self-pay | Admitting: Internal Medicine

## 2018-05-04 DIAGNOSIS — I10 Essential (primary) hypertension: Secondary | ICD-10-CM

## 2018-05-06 ENCOUNTER — Ambulatory Visit (INDEPENDENT_AMBULATORY_CARE_PROVIDER_SITE_OTHER): Payer: Medicare Other | Admitting: Neurology

## 2018-05-06 ENCOUNTER — Encounter

## 2018-05-06 ENCOUNTER — Other Ambulatory Visit: Payer: Self-pay

## 2018-05-06 ENCOUNTER — Encounter: Payer: Self-pay | Admitting: Neurology

## 2018-05-06 VITALS — BP 120/78 | HR 75 | Temp 98.1°F | Ht 64.0 in | Wt 108.1 lb

## 2018-05-06 DIAGNOSIS — L299 Pruritus, unspecified: Secondary | ICD-10-CM

## 2018-05-06 DIAGNOSIS — R202 Paresthesia of skin: Secondary | ICD-10-CM | POA: Diagnosis not present

## 2018-05-06 NOTE — Patient Instructions (Addendum)
We will check a few additional labs and call you with the results  Please work on stress and anxiety management since this seems to be a trigger.    If symptoms get worse, you can try gabapentin that you have

## 2018-05-06 NOTE — Progress Notes (Signed)
[facility] Clinic Note - Initial Visit   Date: 05/06/18  [name] MRN: [id number] DOB: Jul 03, 1935   Dear [name], NP:  Thank you for your kind referral of [name] for consultation of abnormal scalp sensation. Although her history is well known to you, please allow [name] to reiterate it for the purpose of our medical record. The patient was accompanied to the clinic by self.    History of Present Illness: [name] is a 83 y.o. right-handed Caucasian female with hypertension and anxiety presenting for evaluation of abnormal scalp sensation.   Starting in early 2019, she began having spells of itching/burning sensation over the scalp.  It occurs daily and can feel like a crawling sensation to sharp insect bite sensation.  It is improved at nighttime.  She had noticed some relief with application of ice children's benadryl.   She has noticed that stress exacerbates symptoms and when she is calm, it is much improved.  She has seen dermatology (Dr. Lavonna [facility]) who offered a shampoo, but this did not provide relief.  For symptomatic management, she was given a prescription for gabapentin 100mg  at bedtime, but she did not want to take this due to potential cognitive side effects.   She does not have headache, neck pain, numbness/tingling in the limbs, imbalance, or weakness.   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     Out-side paper records, electronic medical record, and images have been reviewed where available and summarized as:  Lab Results  Component Value Date   HGBA1C 5.8 (H) 01/03/2018   Lab Results  Component Value Date   TSH 0.64 07/01/2017    Past Medical History:  Diagnosis Date  . Age-related macular degeneration, dry, both eyes   . Anemia   . Anxiety   . Cholelithiasis   . Endometriosis   . Goiter   . Herpes simplex   . Hyperlipidemia   . Hypertension   . Loss of weight   . Nontoxic multinodular goiter   .  Other abnormal blood chemistry   . PUD (peptic ulcer disease)   . Renal cyst   . Sciatica   . Scoliosis (and kyphoscoliosis), idiopathic   . Unspecified disorder of kidney and ureter   . Urinary frequency     Past Surgical History:  Procedure Laterality Date  . ABDOMINAL HYSTERECTOMY  1971  . CATARACT EXTRACTION W/ INTRAOCULAR LENS  IMPLANT, BILATERAL Bilateral 2016   Dr. Rutherford Guys  . IRRIGATION AND DEBRIDEMENT ABSCESS Left 02/23/2017   Procedure: IRRIGATION AND DEBRIDEMENT ABSCESS;  Surgeon: Mcarthur Rossetti, MD;  Location: [facility];  Service: Orthopedics;  Laterality: Left;  . REPAIR OF PERFORATED ULCER  1973 &1975   Dr Vida Rigger  . STOMACH SURGERY  [phone]   removed 1/2 stomach; "had a total of 5 ORs from 1973-1980; all related to perforated ulcer"  . TONSILLECTOMY       Medications:  Outpatient Encounter Medications as of 05/06/2018  Medication Sig  . ALPRAZolam (XANAX) 0.25 MG tablet TAKE 1 TABLET BY MOUTH EVERY NIGHT AT BEDTIME  . BYSTOLIC 10 MG tablet TAKE 1/2 TABLET BY MOUTH ONCE DAILY  . EXFORGE 10-320 MG tablet TAKE 1 TABLET BY MOUTH EVERY DAY   No facility-administered encounter medications on file as of 05/06/2018.     Allergies: No Known Allergies  Family History: Family History  Problem Relation Age of Onset  . Obesity Mother     Social  History: Social History   Tobacco Use  . Smoking status: Former Smoker    Packs/day: 1.00    Years: 40.00    Pack years: 40.00    Types: Cigarettes    Last attempt to quit: 01/09/1994    Years since quitting: 24.3  . Smokeless tobacco: Never Used  Substance Use Topics  . Alcohol use: No    Comment: 02/25/2017 "recovering alcoholic [id number]"  . Drug use: No   Social History   Social History Narrative  . Not on file    Review of Systems:  CONSTITUTIONAL: No fevers, chills, night sweats, or weight loss.   EYES: No visual changes or eye pain ENT: No hearing changes.  No history of nose bleeds.    RESPIRATORY: No cough, wheezing and shortness of breath.   CARDIOVASCULAR: Negative for chest pain, and palpitations.   GI: Negative for abdominal discomfort, blood in stools or black stools.  No recent change in bowel habits.   GU:  No history of incontinence.   MUSCLOSKELETAL: No history of joint pain or swelling.  No myalgias.   SKIN: Negative for lesions, rash, and itching.   HEMATOLOGY/ONCOLOGY: Negative for prolonged bleeding, bruising easily, and swollen nodes.  No history of cancer.   ENDOCRINE: Negative for cold or heat intolerance, polydipsia or goiter.   PSYCH:  No depression +anxiety symptoms.   NEURO: As Above.   Vital Signs:  BP 120/78   Pulse 75   Temp 98.1 F (36.7 C) (Oral)   Ht 5\' 4"  (1.626 m)   Wt 108 lb 2 oz (49 kg)   SpO2 99%   BMI 18.56 kg/m    General Medical Exam:   General:  Well appearing, comfortable.   Eyes/ENT: see cranial nerve examination.   Neck:   No carotid bruits. Respiratory:  Clear to auscultation, good air entry bilaterally.   Cardiac:  Regular rate and rhythm, no murmur.   Extremities:  No deformities, edema, or skin discoloration.  Skin:  No rashes or lesions.  Neurological Exam: MENTAL STATUS including orientation to time, place, person, recent and remote memory, attention span and concentration, language, and fund of knowledge is normal.  Speech is not dysarthric.  CRANIAL NERVES: II:  No visual field defects.  Cotton wool spots over the fundus, worse on the left.   III-IV-VI: Pupils equal round and reactive to light.  Normal conjugate, extra-ocular eye movements in all directions of gaze.  No nystagmus.  No ptosis.   V:  Normal facial sensation.    VII:  Normal facial symmetry and movements.   VIII:  Normal hearing and vestibular function.   IX-X:  Normal palatal movement.   XI:  Normal shoulder shrug and head rotation.   XII:  Normal tongue strength and range of motion, no deviation or fasciculation.  MOTOR:  Motor strength is  5/5 throughout.  Generalized loss of muscle bulk throughout.  No focal atrophy, fasciculations or abnormal movements.  No pronator drift.   MSRs:  Right        Left                  brachioradialis 2+  2+  biceps 2+  2+  triceps 2+  2+  patellar 2+  2+  ankle jerk 2+  2+  Hoffman no  no  plantar response down  down   SENSORY:  Normal and symmetric perception of light touch, pinprick, vibration, and proprioception.  Romberg's sign absent.   COORDINATION/GAIT: Normal finger-to-  nose.  Intact rapid alternating movements bilaterally.  Able to rise from a chair without using arms.  Gait narrow based and stable. Tandem and stressed gait intact.    IMPRESSION: Atypical paresthesias over the scalp.  Symptoms are not suggestive of primary headache syndrome or neuropathy.  Normal neurological exam makes worrisome pathology very low.  Stress/anxiety is a definite trigger and, if all other possibilities are excluded, it is possible symptoms are stress reaction.  I offered to check vitamin B12 and TSH, but she would like to try stress management techniques and deep breathing to see if this will reduce the frequency and intensity of symptoms. She is welcome to try gabapentin 100mg  at bedtime, if symptoms get severe.     Thank you for allowing me to participate in patient's care.  If I can answer any additional questions, I would be pleased to do so.    Sincerely,    [place] Janusz K. Posey Pronto, DO

## 2018-06-06 ENCOUNTER — Other Ambulatory Visit: Payer: Self-pay | Admitting: Internal Medicine

## 2018-06-12 ENCOUNTER — Encounter: Payer: Self-pay | Admitting: Internal Medicine

## 2018-06-27 ENCOUNTER — Ambulatory Visit: Payer: Self-pay | Admitting: Neurology

## 2018-06-29 ENCOUNTER — Other Ambulatory Visit: Payer: Medicare Other

## 2018-06-29 ENCOUNTER — Other Ambulatory Visit: Payer: Self-pay

## 2018-06-29 DIAGNOSIS — R739 Hyperglycemia, unspecified: Secondary | ICD-10-CM | POA: Diagnosis not present

## 2018-06-29 DIAGNOSIS — E785 Hyperlipidemia, unspecified: Secondary | ICD-10-CM | POA: Diagnosis not present

## 2018-06-29 DIAGNOSIS — Z13220 Encounter for screening for lipoid disorders: Secondary | ICD-10-CM | POA: Diagnosis not present

## 2018-06-29 DIAGNOSIS — D508 Other iron deficiency anemias: Secondary | ICD-10-CM

## 2018-06-30 LAB — COMPLETE METABOLIC PANEL WITH GFR
AG Ratio: 1.6 (calc) (ref 1.0–2.5)
ALT: 11 U/L (ref 6–29)
AST: 18 U/L (ref 10–35)
Albumin: 4.4 g/dL (ref 3.6–5.1)
Alkaline phosphatase (APISO): 116 U/L (ref 37–153)
BUN/Creatinine Ratio: 22 (calc) (ref 6–22)
BUN: 25 mg/dL (ref 7–25)
CO2: 22 mmol/L (ref 20–32)
Calcium: 9.7 mg/dL (ref 8.6–10.4)
Chloride: 106 mmol/L (ref 98–110)
Creat: 1.14 mg/dL — ABNORMAL HIGH (ref 0.60–0.88)
GFR, Est African American: 53 mL/min/{1.73_m2} — ABNORMAL LOW (ref >=60)
GFR, Est Non African American: 45 mL/min/{1.73_m2} — ABNORMAL LOW (ref >=60)
Globulin: 2.7 g/dL (calc) (ref 1.9–3.7)
Glucose, Bld: 109 mg/dL — ABNORMAL HIGH (ref 65–99)
Potassium: 4.2 mmol/L (ref 3.5–5.3)
Sodium: 137 mmol/L (ref 135–146)
Total Bilirubin: 0.4 mg/dL (ref 0.2–1.2)
Total Protein: 7.1 g/dL (ref 6.1–8.1)

## 2018-06-30 LAB — CBC WITH DIFFERENTIAL/PLATELET
Absolute Monocytes: 540 cells/uL (ref 200–950)
Basophils Absolute: 78 cells/uL (ref 0–200)
Basophils Relative: 1.1 %
Eosinophils Absolute: 227 cells/uL (ref 15–500)
Eosinophils Relative: 3.2 %
HCT: 39.9 % (ref 35.0–45.0)
Hemoglobin: 12.6 g/dL (ref 11.7–15.5)
Lymphs Abs: 2947 cells/uL (ref 850–3900)
MCH: 25.1 pg — ABNORMAL LOW (ref 27.0–33.0)
MCHC: 31.6 g/dL — ABNORMAL LOW (ref 32.0–36.0)
MCV: 79.5 fL — ABNORMAL LOW (ref 80.0–100.0)
MPV: 10.4 fL (ref 7.5–12.5)
Monocytes Relative: 7.6 %
Neutro Abs: 3309 cells/uL (ref 1500–7800)
Neutrophils Relative %: 46.6 %
Platelets: 356 10*3/uL (ref 140–400)
RBC: 5.02 10*6/uL (ref 3.80–5.10)
RDW: 14.8 % (ref 11.0–15.0)
Total Lymphocyte: 41.5 %
WBC: 7.1 10*3/uL (ref 3.8–10.8)

## 2018-06-30 LAB — LIPID PANEL
Cholesterol: 220 mg/dL — ABNORMAL HIGH (ref <200)
HDL: 134 mg/dL (ref >=50)
LDL Cholesterol (Calc): 69 mg/dL (calc)
Non-HDL Cholesterol (Calc): 86 mg/dL (calc) (ref <130)
Total CHOL/HDL Ratio: 1.6 (calc) (ref <5.0)
Triglycerides: 90 mg/dL (ref <150)

## 2018-06-30 LAB — HEMOGLOBIN A1C
Hgb A1c MFr Bld: 6 % of total Hgb — ABNORMAL HIGH (ref <5.7)
Mean Plasma Glucose: 126 (calc)
eAG (mmol/L): 7 (calc)

## 2018-07-04 ENCOUNTER — Ambulatory Visit: Payer: Medicare Other | Admitting: Internal Medicine

## 2018-07-04 ENCOUNTER — Encounter: Payer: Self-pay | Admitting: Internal Medicine

## 2018-07-04 ENCOUNTER — Ambulatory Visit (INDEPENDENT_AMBULATORY_CARE_PROVIDER_SITE_OTHER): Payer: Medicare Other | Admitting: Internal Medicine

## 2018-07-04 ENCOUNTER — Other Ambulatory Visit: Payer: Self-pay | Admitting: Internal Medicine

## 2018-07-04 ENCOUNTER — Other Ambulatory Visit: Payer: Self-pay

## 2018-07-04 VITALS — BP 130/78 | HR 77 | Temp 97.8°F | Ht 64.0 in | Wt 109.0 lb

## 2018-07-04 DIAGNOSIS — L299 Pruritus, unspecified: Secondary | ICD-10-CM

## 2018-07-04 DIAGNOSIS — F1021 Alcohol dependence, in remission: Secondary | ICD-10-CM

## 2018-07-04 DIAGNOSIS — M7502 Adhesive capsulitis of left shoulder: Secondary | ICD-10-CM

## 2018-07-04 DIAGNOSIS — Z0000 Encounter for general adult medical examination without abnormal findings: Secondary | ICD-10-CM

## 2018-07-04 DIAGNOSIS — F419 Anxiety disorder, unspecified: Secondary | ICD-10-CM

## 2018-07-04 DIAGNOSIS — F329 Major depressive disorder, single episode, unspecified: Secondary | ICD-10-CM

## 2018-07-04 DIAGNOSIS — F32A Depression, unspecified: Secondary | ICD-10-CM

## 2018-07-04 DIAGNOSIS — I10 Essential (primary) hypertension: Secondary | ICD-10-CM | POA: Diagnosis not present

## 2018-07-04 DIAGNOSIS — R739 Hyperglycemia, unspecified: Secondary | ICD-10-CM

## 2018-07-04 DIAGNOSIS — D508 Other iron deficiency anemias: Secondary | ICD-10-CM

## 2018-07-04 MED ORDER — ALPRAZOLAM 0.25 MG PO TABS: 0.2500 mg | ORAL_TABLET | Freq: Every day | ORAL | 0 refills | Status: DC

## 2018-07-04 NOTE — Progress Notes (Signed)
Provider:  Rexene Edison. Mariea Clonts, D.O., C.M.D. Location:   [facility]   Place of Service:   clinic  Previous PCP: Gayland Curry, DO Patient Care Team: Gayland Curry, DO as PCP - General (Geriatric Medicine) Alden Hipp, MD as Consulting Physician (Obstetrics and Gynecology) Rutherford Guys, MD as Consulting Physician (Ophthalmology)  Extended Emergency Contact Information Primary Emergency Contact: [place] Phone: [phone] Relation: Friend Secondary Emergency Contact: Jeanella Flattery Mobile Phone: [phone] Relation: Significant other  Code Status: DNR Goals of Care: Advanced Directive information Advanced Directives 04/04/2018  Does Patient Have a Medical Advance Directive? No;Yes  Type of Paramedic of [place];Living will  Does patient want to make changes to medical advance directive? No - Patient declined  Copy of [place] in Chart? No - copy requested  Would patient like information on creating a medical advance directive? -      Chief Complaint  Patient presents with  . Annual Exam    CPE    HPI: Patient is a 83 y.o. female seen today for an annual physical exam.  Stinging, crawling, and itching of her head continues on.  Has been to derm and neurology and nothing really worked.  She agrees there is some truth to anxiety.   She just manages it with otc and shampoo.  She insists she had the blood pressure pill long before the itching.  We discussed that beta blockers sometimes cause this kind of itching.    Left shoulder will hurt if she uses it wrong (prior surgery).  She is having some panic attacks--knows what they are. Children's benadryl at night has helped the itching.  Her xanax at night does give her some relief.  Does not take these things during the day due to making her sleepy.  She uses ice sometimes also.  She opted not to take gabapentin that has been recommended by  several of [place].  Lives in [facility].    Needs to get her eyes checked.    Past Medical History:  Diagnosis Date  . Age-related macular degeneration, dry, both eyes   . Anemia   . Anxiety   . Cholelithiasis   . Endometriosis   . Goiter   . Herpes simplex   . Hyperlipidemia   . Hypertension   . Loss of weight   . Nontoxic multinodular goiter   . Other abnormal blood chemistry   . PUD (peptic ulcer disease)   . Renal cyst   . Sciatica   . Scoliosis (and kyphoscoliosis), idiopathic   . Unspecified disorder of kidney and ureter   . Urinary frequency    Past Surgical History:  Procedure Laterality Date  . ABDOMINAL HYSTERECTOMY  1971  . CATARACT EXTRACTION W/ INTRAOCULAR LENS  IMPLANT, BILATERAL Bilateral 2016   Dr. Rutherford Guys  . IRRIGATION AND DEBRIDEMENT ABSCESS Left 02/23/2017   Procedure: IRRIGATION AND DEBRIDEMENT ABSCESS;  Surgeon: Mcarthur Rossetti, MD;  Location: [facility];  Service: Orthopedics;  Laterality: Left;  . REPAIR OF PERFORATED ULCER  1973 &1975   Dr Vida Rigger  . STOMACH SURGERY  [phone]   removed 1/2 stomach; "had a total of 5 ORs from 1973-1980; all related to perforated ulcer"  . TONSILLECTOMY      reports that she quit smoking about 24 years ago. Her smoking use included cigarettes. She has a 40.00 pack-year smoking history. She has never used smokeless tobacco. She reports that she  does not drink alcohol or use drugs.  Functional Status Survey:    Family History  Problem Relation Age of Onset  . Obesity Mother     Health Maintenance  Topic Date Due  . FOOT EXAM  10/17/1947  . OPHTHALMOLOGY EXAM  07/17/2017  . HEMOGLOBIN A1C  12/30/2018  . MAMMOGRAM  Discontinued    No Known Allergies  Outpatient Encounter Medications as of 07/04/2018  Medication Sig  . ALPRAZolam (XANAX) 0.25 MG tablet TAKE 1 TABLET BY MOUTH AT BEDTIME  . BYSTOLIC 10 MG tablet TAKE 1/2 TABLET BY MOUTH ONCE DAILY  . EXFORGE  10-320 MG tablet TAKE 1 TABLET BY MOUTH EVERY DAY   No facility-administered encounter medications on file as of 07/04/2018.     Review of Systems  Constitutional: Negative for chills, fever and malaise/fatigue.  HENT: Negative for congestion.   Eyes: Negative for blurred vision.       Glasses, notes worsening depth perception  Respiratory: Negative for cough and shortness of breath.   Cardiovascular: Negative for chest pain, palpitations and leg swelling.  Gastrointestinal: Negative for abdominal pain, blood in stool, constipation, diarrhea and melena.  Genitourinary: Negative for dysuria.  Musculoskeletal: Positive for joint pain. Negative for falls.  Skin: Positive for itching. Negative for rash.  Neurological: Negative for dizziness and loss of consciousness.  Endo/Heme/Allergies: Does not bruise/bleed easily.  Psychiatric/Behavioral: Negative for depression and memory loss. The patient is nervous/anxious and has insomnia.     Vitals:   07/04/18 1005  BP: 130/78  Pulse: 77  Temp: 97.8 F (36.6 C)  TempSrc: Oral  SpO2: 97%  Weight: 109 lb (49.4 kg)  Height: 5\' 4"  (1.626 m)   Body mass index is 18.71 kg/m. Physical Exam Vitals signs reviewed.  Constitutional:      General: She is not in acute distress.    Appearance: Normal appearance. She is normal weight. She is not ill-appearing or toxic-appearing.  HENT:     Head: Normocephalic and atraumatic.     Right Ear: Tympanic membrane, ear canal and external ear normal.     Left Ear: Tympanic membrane, ear canal and external ear normal.     Nose:     Comments: Deferred due to mask amid covid    Mouth/Throat:     Comments: Deferred due to mask amid covid Eyes:     Extraocular Movements: Extraocular movements intact.     Conjunctiva/sclera: Conjunctivae normal.     Pupils: Pupils are equal, round, and reactive to light.  Neck:     Musculoskeletal: Neck supple. No neck rigidity or muscular tenderness.  Cardiovascular:      Rate and Rhythm: Normal rate and regular rhythm.     Pulses: Normal pulses.     Heart sounds: Normal heart sounds.  Pulmonary:     Effort: Pulmonary effort is normal.     Breath sounds: Normal breath sounds.  Abdominal:     General: Bowel sounds are normal. There is no distension.     Palpations: Abdomen is soft. There is no mass.     Tenderness: There is no abdominal tenderness.  Musculoskeletal: Normal range of motion.     Right lower leg: No edema.     Left lower leg: No edema.     Comments: Wearing knee high socks  Lymphadenopathy:     Cervical: No cervical adenopathy.  Skin:    General: Skin is warm and dry.     Capillary Refill: Capillary refill takes less than 2  seconds.  Neurological:     General: No focal deficit present.     Mental Status: She is alert and oriented to person, place, and time. Mental status is at baseline.     Cranial Nerves: No cranial nerve deficit.     Motor: No weakness.     Gait: Gait normal.  Psychiatric:        Mood and Affect: Mood normal.     Labs reviewed: Basic Metabolic Panel: Recent Labs    01/03/18 1348 06/29/18 0811  NA 139 137  K 4.3 4.2  CL 105 106  CO2 22 22  GLUCOSE 81 109*  BUN 21 25  CREATININE 1.33* 1.14*  CALCIUM 9.6 9.7   Liver Function Tests: Recent Labs    01/03/18 1348 06/29/18 0811  AST 18 18  ALT 10 11  BILITOT 0.4 0.4  PROT 7.1 7.1   No results for input(s): LIPASE, AMYLASE in the last 8760 hours. No results for input(s): AMMONIA in the last 8760 hours. CBC: Recent Labs    01/03/18 1348 06/29/18 0811  WBC 7.3 7.1  NEUTROABS 4,548 3,309  HGB 10.1* 12.6  HCT 32.2* 39.9  MCV 81.9 79.5*  PLT 318 356   Cardiac Enzymes: No results for input(s): CKTOTAL, CKMB, CKMBINDEX, TROPONINI in the last 8760 hours. BNP: Invalid input(s): POCBNP Lab Results  Component Value Date   HGBA1C 6.0 (H) 06/29/2018   Lab Results  Component Value Date   TSH 0.64 07/01/2017   Assessment/Plan Annual exam:   Performed today -continues to refuse all vaccinations and preventive care approaches   1. Anxiety and depression - felt to be cause of her itching of her scalp; could actually be beta blocker related but pt afraid to try going off of it and trying a new bp med b/c bp well controlled - ALPRAZolam (XANAX) 0.25 MG tablet; Take 1 tablet (0.25 mg total) by mouth at bedtime.  Dispense: 30 tablet; Refill: 0  2. Recovering alcoholic in remission [facility]) -continues to attend AA groups virtually  3. Essential hypertension, benign -bp at goal with current therapy--cont bystolic and exforge  4. Itchy scalp -felt to be anxiety related by derm and neuro and pt afraid to come off beta blocker to see if that's it  5. Adhesive capsulitis of left shoulder -generally doing well--only bothers her if she overdoes it  6. Iron deficiency anemia secondary to inadequate dietary iron intake -resolved on its own; some due to shoulder surgery likely -pt did not change anything  7. Hyperglycemia - only abnormality on labs--counseled on being careful with too many carbs--does eat pasta as she grows tired of veggies  - Hemoglobin A1c; Future--f/u before we meet next  Labs/tests ordered:   Orders Placed This Encounter  Procedures  . Hemoglobin A1c    Standing Status:   Future    Standing Expiration Date:   07/04/2019   6 mos with fasting lab before  Shamila Lerch L. Sherrill Mckamie, D.O. [facility] [address], [place] [address] Cell Phone (Mon-Fri 8am-5pm):  [phone] On Call:  [phone] & follow prompts after 5pm & weekends Office Phone:  [phone] Office Fax:  [phone]

## 2018-07-25 ENCOUNTER — Other Ambulatory Visit: Payer: Self-pay | Admitting: Internal Medicine

## 2018-07-25 DIAGNOSIS — I10 Essential (primary) hypertension: Secondary | ICD-10-CM

## 2018-08-02 ENCOUNTER — Other Ambulatory Visit: Payer: Self-pay | Admitting: Internal Medicine

## 2018-08-02 DIAGNOSIS — F32A Depression, unspecified: Secondary | ICD-10-CM

## 2018-08-02 DIAGNOSIS — F329 Major depressive disorder, single episode, unspecified: Secondary | ICD-10-CM

## 2018-08-29 ENCOUNTER — Other Ambulatory Visit: Payer: Self-pay | Admitting: Internal Medicine

## 2018-08-29 DIAGNOSIS — F419 Anxiety disorder, unspecified: Secondary | ICD-10-CM

## 2018-08-29 DIAGNOSIS — F329 Major depressive disorder, single episode, unspecified: Secondary | ICD-10-CM

## 2018-08-29 NOTE — Telephone Encounter (Signed)
Last filled 08/02/18 per Epic, 3 days early

## 2018-09-12 ENCOUNTER — Encounter: Payer: Self-pay | Admitting: Nurse Practitioner

## 2018-09-12 ENCOUNTER — Ambulatory Visit (INDEPENDENT_AMBULATORY_CARE_PROVIDER_SITE_OTHER): Payer: Medicare Other | Admitting: Nurse Practitioner

## 2018-09-12 ENCOUNTER — Other Ambulatory Visit: Payer: Self-pay

## 2018-09-12 VITALS — BP 130/68 | HR 61 | Temp 98.1°F | Ht 64.0 in | Wt 108.6 lb

## 2018-09-12 DIAGNOSIS — R399 Unspecified symptoms and signs involving the genitourinary system: Secondary | ICD-10-CM

## 2018-09-12 DIAGNOSIS — R300 Dysuria: Secondary | ICD-10-CM

## 2018-09-12 LAB — POCT URINALYSIS DIPSTICK
Blood, UA: NEGATIVE
Glucose, UA: POSITIVE — AB
Ketones, UA: NEGATIVE
Nitrite, UA: POSITIVE
Protein, UA: POSITIVE — AB
Spec Grav, UA: 1.01 (ref 1.010–1.025)
Urobilinogen, UA: 2 E.U./dL — AB
pH, UA: 6.5 (ref 5.0–8.0)

## 2018-09-12 MED ORDER — CEPHALEXIN 500 MG PO CAPS: 500.0000 mg | ORAL_CAPSULE | Freq: Two times a day (BID) | ORAL | 0 refills | Status: DC

## 2018-09-12 NOTE — Patient Instructions (Signed)
Increase fluid intake Continue cranberry tablet.  To start keflex 500 mg by mouth twice daily- complete course We will notify you if you need to change antibiotics based on culture.

## 2018-09-12 NOTE — Progress Notes (Signed)
Careteam: Patient Care Team: Gayland Curry, DO as PCP - General (Geriatric Medicine) Alden Hipp, MD as Consulting Physician (Obstetrics and Gynecology) Rutherford Guys, MD as Consulting Physician (Ophthalmology)  Advanced Directive information Does Patient Have a Medical Advance Directive?: Yes, Type of Advance Directive: [place];Living will, Does patient want to make changes to medical advance directive?: No - Patient declined  No Known Allergies  Chief Complaint  Patient presents with  . Acute Visit    Possible Uti patient states she takes cranberry pills and azo but has gotten worse over past week. experiencing frequency urinating, pressure and burning.     HPI: Patient is a 83 y.o. female seen in the office today for pain, pressure and tingling when urination. She states symptoms have been going on for about 1 week. Tries to manage on her own but not getting better. Effecting her sleep.  No fever. Abdominal pressure.  Tingling after she gets done urinating. Increase in frequency.  No blood in urine.    Review of Systems:  Review of Systems  Constitutional: Negative for chills, fever and weight loss.  Genitourinary: Positive for dysuria, frequency and urgency. Negative for hematuria.    Past Medical History:  Diagnosis Date  . Age-related macular degeneration, dry, both eyes   . Anemia   . Anxiety   . Cholelithiasis   . Endometriosis   . Goiter   . Herpes simplex   . Hyperlipidemia   . Hypertension   . Loss of weight   . Nontoxic multinodular goiter   . Other abnormal blood chemistry   . PUD (peptic ulcer disease)   . Renal cyst   . Sciatica   . Scoliosis (and kyphoscoliosis), idiopathic   . Unspecified disorder of kidney and ureter   . Urinary frequency    Past Surgical History:  Procedure Laterality Date  . ABDOMINAL HYSTERECTOMY  1971  . CATARACT EXTRACTION W/ INTRAOCULAR LENS  IMPLANT, BILATERAL Bilateral 2016   Dr.  Rutherford Guys  . IRRIGATION AND DEBRIDEMENT ABSCESS Left 02/23/2017   Procedure: IRRIGATION AND DEBRIDEMENT ABSCESS;  Surgeon: Mcarthur Rossetti, MD;  Location: [facility];  Service: Orthopedics;  Laterality: Left;  . REPAIR OF PERFORATED ULCER  1973 &1975   Dr Vida Rigger  . STOMACH SURGERY  [phone]   removed 1/2 stomach; "had a total of 5 ORs from 1973-1980; all related to perforated ulcer"  . TONSILLECTOMY     Social History:   reports that she quit smoking about 24 years ago. Her smoking use included cigarettes. She has a 40.00 pack-year smoking history. She has never used smokeless tobacco. She reports that she does not drink alcohol or use drugs.  Family History  Problem Relation Age of Onset  . Obesity Mother     Medications: Patient's Medications  New Prescriptions   No medications on file  Previous Medications   ALPRAZOLAM (XANAX) 0.25 MG TABLET    TAKE 1 TABLET BY MOUTH AT BEDTIME   BYSTOLIC 10 MG TABLET    TAKE 1/2 TABLET BY MOUTH ONCE DAILY   EXFORGE 10-320 MG TABLET    TAKE 1 TABLET BY MOUTH EVERY DAY  Modified Medications   No medications on file  Discontinued Medications   No medications on file    Physical Exam:  Vitals:   09/12/18 1131  BP: 130/68  Pulse: 61  Temp: 98.1 F (36.7 C)  TempSrc: Oral  SpO2: 98%  Weight: 108 lb 9.6 oz (49.3 kg)  Height: 5\' 4"  (1.626 m)   Body mass index is 18.64 kg/m. Wt Readings from Last 3 Encounters:  09/12/18 108 lb 9.6 oz (49.3 kg)  07/04/18 109 lb (49.4 kg)  05/06/18 108 lb 2 oz (49 kg)    Physical Exam Constitutional:      Appearance: Normal appearance.  Cardiovascular:     Rate and Rhythm: Normal rate and regular rhythm.  Abdominal:     General: Abdomen is flat. Bowel sounds are normal. There is no distension.     Palpations: Abdomen is soft.     Tenderness: There is no abdominal tenderness. There is no right CVA tenderness or left CVA tenderness.  Neurological:     Mental Status: She is alert.    Labs  reviewed: Basic Metabolic Panel: Recent Labs    01/03/18 1348 06/29/18 0811  NA 139 137  K 4.3 4.2  CL 105 106  CO2 22 22  GLUCOSE 81 109*  BUN 21 25  CREATININE 1.33* 1.14*  CALCIUM 9.6 9.7   Liver Function Tests: Recent Labs    01/03/18 1348 06/29/18 0811  AST 18 18  ALT 10 11  BILITOT 0.4 0.4  PROT 7.1 7.1   No results for input(s): LIPASE, AMYLASE in the last 8760 hours. No results for input(s): AMMONIA in the last 8760 hours. CBC: Recent Labs    01/03/18 1348 06/29/18 0811  WBC 7.3 7.1  NEUTROABS 4,548 3,309  HGB 10.1* 12.6  HCT 32.2* 39.9  MCV 81.9 79.5*  PLT 318 356   Lipid Panel: Recent Labs    06/29/18 0811  CHOL 220*  HDL 134  LDLCALC 69  TRIG 90  CHOLHDL 1.6   TSH: No results for input(s): TSH in the last 8760 hours. A1C: Lab Results  Component Value Date   HGBA1C 6.0 (H) 06/29/2018     Assessment/Plan 1. UTI symptoms -to continue with hydration, can use Phenazopyridine for up to 72 hours -continue with cranberry tablets daily - POC Urinalysis Dipstick- altered due to use of Phenazopyridine, sending for culture - cephALEXin (KEFLEX) 500 MG capsule; Take 1 capsule (500 mg total) by mouth 2 (two) times daily.  Dispense: 14 capsule; Refill: 0 - Culture, Urine  2. Dysuria - cephALEXin (KEFLEX) 500 MG capsule; Take 1 capsule (500 mg total) by mouth 2 (two) times daily.  Dispense: 14 capsule; Refill: 0 - Culture, Urine  Jessica K. [place], [facility] [phone]

## 2018-09-14 LAB — URINE CULTURE
MICRO NUMBER:: 707292
SPECIMEN QUALITY:: ADEQUATE

## 2018-09-15 ENCOUNTER — Ambulatory Visit (INDEPENDENT_AMBULATORY_CARE_PROVIDER_SITE_OTHER): Payer: Medicare Other | Admitting: Nurse Practitioner

## 2018-09-15 ENCOUNTER — Other Ambulatory Visit: Payer: Self-pay

## 2018-09-15 ENCOUNTER — Encounter: Payer: Self-pay | Admitting: Nurse Practitioner

## 2018-09-15 VITALS — BP 120/70 | HR 68 | Temp 98.6°F | Ht 64.0 in | Wt 108.0 lb

## 2018-09-15 DIAGNOSIS — N3281 Overactive bladder: Secondary | ICD-10-CM

## 2018-09-15 DIAGNOSIS — R350 Frequency of micturition: Secondary | ICD-10-CM | POA: Diagnosis not present

## 2018-09-15 DIAGNOSIS — N898 Other specified noninflammatory disorders of vagina: Secondary | ICD-10-CM

## 2018-09-15 LAB — POCT URINALYSIS DIPSTICK
Bilirubin, UA: NEGATIVE
Blood, UA: NEGATIVE
Glucose, UA: NEGATIVE
Ketones, UA: NEGATIVE
Leukocytes, UA: NEGATIVE
Nitrite, UA: POSITIVE
Protein, UA: NEGATIVE
Spec Grav, UA: 1.015 (ref 1.010–1.025)
Urobilinogen, UA: 0.2 E.U./dL
pH, UA: 5 (ref 5.0–8.0)

## 2018-09-15 NOTE — Progress Notes (Signed)
Careteam: Patient Care Team: Gayland Curry, DO as PCP - General (Geriatric Medicine) Alden Hipp, MD as Consulting Physician (Obstetrics and Gynecology) Rutherford Guys, MD as Consulting Physician (Ophthalmology)  Advanced Directive information    No Known Allergies  Chief Complaint  Patient presents with  . Acute Visit    painful urination     HPI: Patient is a 83 y.o. female seen in the office today due to ongoing painful urination.  Did not take antibiotic for 3 days.  Feeling a lot better but still having frequency of urination.  More so at night Feels like she can live with it.  Burning and pressure has improved.  She continues to urinate every 2 hours at night. Not going as much during the day which has been going on for years and is not new.  Has not taken azo since Monday.  Having vaginal dryness. Had been using estrogen oral years ago (25 years ago) but stopped due to potential side effects.   Has used lubricant  Has tried medication for overactive bladder - can not remember but did not want to stay on   Review of Systems:  Review of Systems  Constitutional: Negative for malaise/fatigue and weight loss.  Genitourinary: Positive for frequency. Negative for dysuria, flank pain, hematuria and urgency.    Past Medical History:  Diagnosis Date  . Age-related macular degeneration, dry, both eyes   . Anemia   . Anxiety   . Cholelithiasis   . Endometriosis   . Goiter   . Herpes simplex   . Hyperlipidemia   . Hypertension   . Loss of weight   . Nontoxic multinodular goiter   . Other abnormal blood chemistry   . PUD (peptic ulcer disease)   . Renal cyst   . Sciatica   . Scoliosis (and kyphoscoliosis), idiopathic   . Unspecified disorder of kidney and ureter   . Urinary frequency    Past Surgical History:  Procedure Laterality Date  . ABDOMINAL HYSTERECTOMY  1971  . CATARACT EXTRACTION W/ INTRAOCULAR LENS  IMPLANT, BILATERAL Bilateral 2016   Dr.  Rutherford Guys  . IRRIGATION AND DEBRIDEMENT ABSCESS Left 02/23/2017   Procedure: IRRIGATION AND DEBRIDEMENT ABSCESS;  Surgeon: Mcarthur Rossetti, MD;  Location: [facility];  Service: Orthopedics;  Laterality: Left;  . REPAIR OF PERFORATED ULCER  1973 &1975   Dr Vida Rigger  . STOMACH SURGERY  [phone]   removed 1/2 stomach; "had a total of 5 ORs from 1973-1980; all related to perforated ulcer"  . TONSILLECTOMY     Social History:   reports that she quit smoking about 24 years ago. Her smoking use included cigarettes. She has a 40.00 pack-year smoking history. She has never used smokeless tobacco. She reports that she does not drink alcohol or use drugs.  Family History  Problem Relation Age of Onset  . Obesity Mother     Medications: Patient's Medications  New Prescriptions   No medications on file  Previous Medications   ALPRAZOLAM (XANAX) 0.25 MG TABLET    TAKE 1 TABLET BY MOUTH AT BEDTIME   BYSTOLIC 10 MG TABLET    TAKE 1/2 TABLET BY MOUTH ONCE DAILY   EXFORGE 10-320 MG TABLET    TAKE 1 TABLET BY MOUTH EVERY DAY  Modified Medications   No medications on file  Discontinued Medications   CEPHALEXIN (KEFLEX) 500 MG CAPSULE    Take 1 capsule (500 mg total) by mouth 2 (two) times daily.  Physical Exam:  Vitals:   09/15/18 1103  BP: 120/70  Pulse: 68  Temp: 98.6 F (37 C)  TempSrc: Oral  SpO2: 99%  Weight: 108 lb (49 kg)  Height: 5\' 4"  (1.626 m)   Body mass index is 18.54 kg/m. Wt Readings from Last 3 Encounters:  09/15/18 108 lb (49 kg)  09/12/18 108 lb 9.6 oz (49.3 kg)  07/04/18 109 lb (49.4 kg)    Physical Exam Constitutional:      Appearance: Normal appearance.  Cardiovascular:     Rate and Rhythm: Normal rate and regular rhythm.  Abdominal:     General: Abdomen is flat. There is no distension.     Palpations: Abdomen is soft.     Tenderness: There is no abdominal tenderness.  Neurological:     Mental Status: She is alert.     Labs reviewed: Basic  Metabolic Panel: Recent Labs    01/03/18 1348 06/29/18 0811  NA 139 137  K 4.3 4.2  CL 105 106  CO2 22 22  GLUCOSE 81 109*  BUN 21 25  CREATININE 1.33* 1.14*  CALCIUM 9.6 9.7   Liver Function Tests: Recent Labs    01/03/18 1348 06/29/18 0811  AST 18 18  ALT 10 11  BILITOT 0.4 0.4  PROT 7.1 7.1   No results for input(s): LIPASE, AMYLASE in the last 8760 hours. No results for input(s): AMMONIA in the last 8760 hours. CBC: Recent Labs    01/03/18 1348 06/29/18 0811  WBC 7.3 7.1  NEUTROABS 4,548 3,309  HGB 10.1* 12.6  HCT 32.2* 39.9  MCV 81.9 79.5*  PLT 318 356   Lipid Panel: Recent Labs    06/29/18 0811  CHOL 220*  HDL 134  LDLCALC 69  TRIG 90  CHOLHDL 1.6   TSH: No results for input(s): TSH in the last 8760 hours. A1C: Lab Results  Component Value Date   HGBA1C 6.0 (H) 06/29/2018     Assessment/Plan 1. Urinary frequency - POC Urinalysis Dipstick improved from previous, pt without symptoms of pain and urgency   2. Vaginal dryness encouraged to use vaginal lubricant routinely   3. Overactive bladder -ongoing, worse at night which is most bothersome, encouraged to drink most fluids earlier in the day limiting fluids prior to bedtime.  -has been on medication for OAB in the past which she did not continue on but unsure why.  Next appt: 12/28/2018 Carlos American. [place], [facility] [phone]

## 2018-09-15 NOTE — Patient Instructions (Signed)
To drink most of your water in the morning and afternoon that way it helps with getting up frequently at bedtime.   To use vaginal lubricant routinely

## 2018-09-19 DIAGNOSIS — H524 Presbyopia: Secondary | ICD-10-CM | POA: Diagnosis not present

## 2018-09-19 DIAGNOSIS — H52203 Unspecified astigmatism, bilateral: Secondary | ICD-10-CM | POA: Diagnosis not present

## 2018-09-19 DIAGNOSIS — H353133 Nonexudative age-related macular degeneration, bilateral, advanced atrophic without subfoveal involvement: Secondary | ICD-10-CM | POA: Diagnosis not present

## 2018-09-19 DIAGNOSIS — Z961 Presence of intraocular lens: Secondary | ICD-10-CM | POA: Diagnosis not present

## 2018-09-19 DIAGNOSIS — H5213 Myopia, bilateral: Secondary | ICD-10-CM | POA: Diagnosis not present

## 2018-09-22 ENCOUNTER — Ambulatory Visit (INDEPENDENT_AMBULATORY_CARE_PROVIDER_SITE_OTHER): Payer: Medicare Other | Admitting: Nurse Practitioner

## 2018-09-22 ENCOUNTER — Encounter: Payer: Self-pay | Admitting: Nurse Practitioner

## 2018-09-22 ENCOUNTER — Other Ambulatory Visit: Payer: Self-pay

## 2018-09-22 VITALS — BP 120/70 | HR 73 | Temp 98.3°F | Ht 64.0 in | Wt 108.4 lb

## 2018-09-22 DIAGNOSIS — R350 Frequency of micturition: Secondary | ICD-10-CM

## 2018-09-22 NOTE — Progress Notes (Signed)
Careteam: Patient Care Team: Gayland Curry, DO as PCP - General (Geriatric Medicine) Alden Hipp, MD as Consulting Physician (Obstetrics and Gynecology) Rutherford Guys, MD as Consulting Physician (Ophthalmology)  Advanced Directive information    No Known Allergies  Chief Complaint  Patient presents with  . Acute Visit    Ongoing urinary concerns. Patient c/o leakage, frequency and a constant urge. Patient took myrtberiq 25 mg (left over from 2018) and it was ineffective.      HPI: Patient is a 83 y.o. female seen in the office today due to ongoing frequency and urgency. Feels like her bladder is open but she can not empty her bladder all the way when she goes. Does not have a problem starting or stopping stream. Some episodes of incontinence. Hx of hysterectomy.  No pain with urination- this has resolved.  Frequency and urgency has been progressively getting worse over the last 6 months. Had an old prescription for myrbetriq, took last night but did not help.     Review of Systems:  Review of Systems  Constitutional: Negative for chills and fever.  Gastrointestinal: Negative for abdominal pain.  Genitourinary: Positive for frequency and urgency. Negative for dysuria, flank pain and hematuria.    Past Medical History:  Diagnosis Date  . Age-related macular degeneration, dry, both eyes   . Anemia   . Anxiety   . Cholelithiasis   . Endometriosis   . Goiter   . Herpes simplex   . Hyperlipidemia   . Hypertension   . Loss of weight   . Nontoxic multinodular goiter   . Other abnormal blood chemistry   . PUD (peptic ulcer disease)   . Renal cyst   . Sciatica   . Scoliosis (and kyphoscoliosis), idiopathic   . Unspecified disorder of kidney and ureter   . Urinary frequency    Past Surgical History:  Procedure Laterality Date  . ABDOMINAL HYSTERECTOMY  1971  . CATARACT EXTRACTION W/ INTRAOCULAR LENS  IMPLANT, BILATERAL Bilateral 2016   Dr. Rutherford Guys  .  IRRIGATION AND DEBRIDEMENT ABSCESS Left 02/23/2017   Procedure: IRRIGATION AND DEBRIDEMENT ABSCESS;  Surgeon: Mcarthur Rossetti, MD;  Location: [facility];  Service: Orthopedics;  Laterality: Left;  . REPAIR OF PERFORATED ULCER  1973 &1975   Dr Vida Rigger  . STOMACH SURGERY  [phone]   removed 1/2 stomach; "had a total of 5 ORs from 1973-1980; all related to perforated ulcer"  . TONSILLECTOMY     Social History:   reports that she quit smoking about 24 years ago. Her smoking use included cigarettes. She has a 40.00 pack-year smoking history. She has never used smokeless tobacco. She reports that she does not drink alcohol or use drugs.  Family History  Problem Relation Age of Onset  . Obesity Mother     Medications: Patient's Medications  New Prescriptions   No medications on file  Previous Medications   ALPRAZOLAM (XANAX) 0.25 MG TABLET    TAKE 1 TABLET BY MOUTH AT BEDTIME   BYSTOLIC 10 MG TABLET    TAKE 1/2 TABLET BY MOUTH ONCE DAILY   EXFORGE 10-320 MG TABLET    TAKE 1 TABLET BY MOUTH EVERY DAY  Modified Medications   No medications on file  Discontinued Medications   No medications on file    Physical Exam:  Vitals:   09/22/18 1042  BP: 120/70  Pulse: 73  Temp: 98.3 F (36.8 C)  TempSrc: Oral  SpO2: 99%  Weight: 108 lb  6.4 oz (49.2 kg)  Height: 5\' 4"  (1.626 m)   Body mass index is 18.61 kg/m. Wt Readings from Last 3 Encounters:  09/22/18 108 lb 6.4 oz (49.2 kg)  09/15/18 108 lb (49 kg)  09/12/18 108 lb 9.6 oz (49.3 kg)    Physical Exam Exam conducted with a chaperone present.  Constitutional:      Appearance: Normal appearance.  Cardiovascular:     Rate and Rhythm: Normal rate and regular rhythm.     Pulses: Normal pulses.  Pulmonary:     Effort: Pulmonary effort is normal.     Breath sounds: Normal breath sounds.  Abdominal:     General: Abdomen is flat. Bowel sounds are normal. There is no distension.     Palpations: Abdomen is soft.     Tenderness:  There is no abdominal tenderness.  Genitourinary:    General: Normal vulva.     Urethra: No prolapse.  Neurological:     Mental Status: She is alert.     Labs reviewed: Basic Metabolic Panel: Recent Labs    01/03/18 1348 06/29/18 0811  NA 139 137  K 4.3 4.2  CL 105 106  CO2 22 22  GLUCOSE 81 109*  BUN 21 25  CREATININE 1.33* 1.14*  CALCIUM 9.6 9.7   Liver Function Tests: Recent Labs    01/03/18 1348 06/29/18 0811  AST 18 18  ALT 10 11  BILITOT 0.4 0.4  PROT 7.1 7.1   No results for input(s): LIPASE, AMYLASE in the last 8760 hours. No results for input(s): AMMONIA in the last 8760 hours. CBC: Recent Labs    01/03/18 1348 06/29/18 0811  WBC 7.3 7.1  NEUTROABS 4,548 3,309  HGB 10.1* 12.6  HCT 32.2* 39.9  MCV 81.9 79.5*  PLT 318 356   Lipid Panel: Recent Labs    06/29/18 0811  CHOL 220*  HDL 134  LDLCALC 69  TRIG 90  CHOLHDL 1.6   TSH: No results for input(s): TSH in the last 8760 hours. A1C: Lab Results  Component Value Date   HGBA1C 6.0 (H) 06/29/2018     Assessment/Plan 1. Urinary frequency frequency, urgency with incontinence at this time - Ambulatory referral to Urology for further urodynamic studies.  -discussed that myrbetriq could take 4-6 weeks to see results but would like evaluation by urologist prior to starting medication at this time if we can get a timely referral.   Next appt: 12/28/2018 Janett Billow K. [place], [facility] [phone]

## 2018-09-22 NOTE — Patient Instructions (Signed)
Will call you today with update from urology referral.   If you do not hear anything today call us back tomorrow and ask to speak with Lattie Haw for an update.

## 2018-10-06 DIAGNOSIS — R350 Frequency of micturition: Secondary | ICD-10-CM | POA: Diagnosis not present

## 2018-10-06 DIAGNOSIS — R3915 Urgency of urination: Secondary | ICD-10-CM | POA: Diagnosis not present

## 2018-11-15 ENCOUNTER — Ambulatory Visit: Payer: Medicare Other

## 2018-11-15 ENCOUNTER — Ambulatory Visit (INDEPENDENT_AMBULATORY_CARE_PROVIDER_SITE_OTHER): Payer: Medicare Other | Admitting: Podiatry

## 2018-11-15 ENCOUNTER — Encounter: Payer: Self-pay | Admitting: Podiatry

## 2018-11-15 ENCOUNTER — Other Ambulatory Visit: Payer: Self-pay

## 2018-11-15 VITALS — BP 149/75 | HR 73 | Resp 16

## 2018-11-15 DIAGNOSIS — G5791 Unspecified mononeuropathy of right lower limb: Secondary | ICD-10-CM

## 2018-11-15 DIAGNOSIS — G5792 Unspecified mononeuropathy of left lower limb: Secondary | ICD-10-CM

## 2018-11-15 DIAGNOSIS — G5793 Unspecified mononeuropathy of bilateral lower limbs: Secondary | ICD-10-CM

## 2018-11-15 DIAGNOSIS — M778 Other enthesopathies, not elsewhere classified: Secondary | ICD-10-CM

## 2018-11-15 DIAGNOSIS — G621 Alcoholic polyneuropathy: Secondary | ICD-10-CM

## 2018-11-15 MED ORDER — GABAPENTIN 100 MG PO CAPS: 100.0000 mg | ORAL_CAPSULE | Freq: Every day | ORAL | 3 refills | Status: DC

## 2018-11-16 NOTE — Progress Notes (Signed)
Subjective:  Patient ID: [name], female    DOB: 26-Jun-1935,  MRN: [id number] HPI Chief Complaint  Patient presents with  . Toe Pain    Toes bilateral - "feels like my shoes are too short", tips of toes feel pressure, tingling, feet are itchy at night, keeps her awake  . New Patient (Initial Visit)    83 y.o. female presents with the above complaint.   ROS: Denies fever chills nausea vomiting muscle aches pains calf pain back pain chest pain shortness of breath.  Past Medical History:  Diagnosis Date  . Age-related macular degeneration, dry, both eyes   . Anemia   . Anxiety   . Cholelithiasis   . Endometriosis   . Goiter   . Herpes simplex   . Hyperlipidemia   . Hypertension   . Loss of weight   . Nontoxic multinodular goiter   . Other abnormal blood chemistry   . PUD (peptic ulcer disease)   . Renal cyst   . Sciatica   . Scoliosis (and kyphoscoliosis), idiopathic   . Unspecified disorder of kidney and ureter   . Urinary frequency    Past Surgical History:  Procedure Laterality Date  . ABDOMINAL HYSTERECTOMY  1971  . CATARACT EXTRACTION W/ INTRAOCULAR LENS  IMPLANT, BILATERAL Bilateral 2016   Dr. Rutherford Guys  . IRRIGATION AND DEBRIDEMENT ABSCESS Left 02/23/2017   Procedure: IRRIGATION AND DEBRIDEMENT ABSCESS;  Surgeon: Mcarthur Rossetti, MD;  Location: [facility];  Service: Orthopedics;  Laterality: Left;  . REPAIR OF PERFORATED ULCER  1973 &1975   Dr Vida Rigger  . STOMACH SURGERY  [phone]   removed 1/2 stomach; "had a total of 5 ORs from 1973-1980; all related to perforated ulcer"  . TONSILLECTOMY      Current Outpatient Medications:  .  ALPRAZolam (XANAX) 0.25 MG tablet, TAKE 1 TABLET BY MOUTH AT BEDTIME, Disp: 30 tablet, Rfl: 5 .  BYSTOLIC 10 MG tablet, TAKE 1/2 TABLET BY MOUTH ONCE DAILY, Disp: 45 tablet, Rfl: 1 .  EXFORGE 10-320 MG tablet, TAKE 1 TABLET BY MOUTH EVERY DAY, Disp: 180 tablet, Rfl: 1 .  gabapentin (NEURONTIN) 100 MG capsule, Take 1 capsule (100  mg total) by mouth at bedtime., Disp: 30 capsule, Rfl: 3 .  MYRBETRIQ 25 MG TB24 tablet, , Disp: , Rfl:   No Known Allergies Review of Systems Objective:   Vitals:   11/15/18 1332  BP: (!) 149/75  Pulse: 73  Resp: 16    General: Well developed, nourished, in no acute distress, alert and oriented x3   Dermatological: Skin is warm, dry and supple bilateral. Nails x 10 are well maintained; remaining integument appears unremarkable at this time. There are no open sores, no preulcerative lesions, no rash or signs of infection present.  Vascular: Dorsalis Pedis artery and Posterior Tibial artery pedal pulses are 2/4 bilateral with immedate capillary fill time. Pedal hair growth present. No varicosities and no lower extremity edema present bilateral.   Neruologic: Grossly intact via light touch bilateral. Vibratory intact via tuning fork bilateral. Protective threshold with Semmes Wienstein monofilament intact to all pedal sites bilateral. Patellar and Achilles deep tendon reflexes 2+ bilateral. No Babinski or clonus noted bilateral.   Musculoskeletal: No gross boney pedal deformities bilateral. No pain, crepitus, or limitation noted with foot and ankle range of motion bilateral. Muscular strength 5/5 in all groups tested bilateral.  Gait: Unassisted, Nonantalgic.    Radiographs:  None taken  Assessment & Plan:   Assessment: Probable neuropathic  symptoms.  Plan: To her on 100 mg of gabapentin at nighttime.  We will follow-up with her in 1 month     Cherell Colvin T. [place], [place]

## 2018-11-24 ENCOUNTER — Ambulatory Visit (INDEPENDENT_AMBULATORY_CARE_PROVIDER_SITE_OTHER): Payer: Medicare Other | Admitting: Adult Health

## 2018-11-24 ENCOUNTER — Other Ambulatory Visit: Payer: Self-pay

## 2018-11-24 ENCOUNTER — Encounter: Payer: Self-pay | Admitting: Adult Health

## 2018-11-24 VITALS — BP 140/74 | HR 74 | Temp 97.3°F | Resp 18 | Ht 64.0 in | Wt 109.0 lb

## 2018-11-24 DIAGNOSIS — D508 Other iron deficiency anemias: Secondary | ICD-10-CM

## 2018-11-24 DIAGNOSIS — R109 Unspecified abdominal pain: Secondary | ICD-10-CM | POA: Diagnosis not present

## 2018-11-24 NOTE — Progress Notes (Addendum)
[facility]  Provider:  Durenda Age - DNP, FNP-BC, MSN   Goals of Care:  Advanced Directives 11/24/2018  Does Patient Have a Medical Advance Directive? Yes  Type of Paramedic of [place];Living will  Does patient want to make changes to medical advance directive? No - Patient declined  Copy of [place] in Chart? Yes - validated most recent copy scanned in chart (See row information)  Would patient like information on creating a medical advance directive? -     Chief Complaint  Patient presents with  . Acute Visit    C/o -abdomen pain x 4-5 days,     HPI: Patient is a 83 y.o. female seen today for complaints of abdominal pain. Her pain is midline from the upper quadrant to the lower quadrant. She rates the pain as 4-5/10. The pain started over the weekend. She "feels like something got twisted inside." She denies vomiting. Pain is not constant and she is able to continue her daily activities. She had bowel movements this morning, normal looking and amount and no blood. She feels bloated and feels she has gas that needs to come out. Her abdomen is soft and no guarding upon palpation. She thinks that she will be better if she passes gas out. She reported having 1/2 of her stomach removed 40 years ago due to perforated ulcer.    Past Medical History:  Diagnosis Date  . Age-related macular degeneration, dry, both eyes   . Anemia   . Anxiety   . Cholelithiasis   . Endometriosis   . Goiter   . Herpes simplex   . Hyperlipidemia   . Hypertension   . Loss of weight   . Nontoxic multinodular goiter   . Other abnormal blood chemistry   . PUD (peptic ulcer disease)   . Renal cyst   . Sciatica   . Scoliosis (and kyphoscoliosis), idiopathic   . Unspecified disorder of kidney and ureter   . Urinary frequency     Past Surgical History:  Procedure Laterality Date  . ABDOMINAL HYSTERECTOMY  1971  . CATARACT EXTRACTION W/  INTRAOCULAR LENS  IMPLANT, BILATERAL Bilateral 2016   Dr. Rutherford Guys  . IRRIGATION AND DEBRIDEMENT ABSCESS Left 02/23/2017   Procedure: IRRIGATION AND DEBRIDEMENT ABSCESS;  Surgeon: Mcarthur Rossetti, MD;  Location: [facility];  Service: Orthopedics;  Laterality: Left;  . REPAIR OF PERFORATED ULCER  1973 &1975   Dr Vida Rigger  . STOMACH SURGERY  [id number]   removed 1/2 stomach; "had a total of 5 ORs from 1973-1980; all related to perforated ulcer"  . TONSILLECTOMY      No Known Allergies  Outpatient Encounter Medications as of 11/24/2018  Medication Sig  . ALPRAZolam (XANAX) 0.25 MG tablet TAKE 1 TABLET BY MOUTH AT BEDTIME  . BYSTOLIC 10 MG tablet TAKE 1/2 TABLET BY MOUTH ONCE DAILY  . EXFORGE 10-320 MG tablet TAKE 1 TABLET BY MOUTH EVERY DAY  . gabapentin (NEURONTIN) 100 MG capsule Take 1 capsule (100 mg total) by mouth at bedtime.  Marland Kitchen MYRBETRIQ 25 MG TB24 tablet Take 25 mg by mouth at bedtime.    No facility-administered encounter medications on file as of 11/24/2018.     Review of Systems:  Review of Systems  Constitutional: Negative for activity change, appetite change, fever and unexpected weight change.  HENT: Negative.   Cardiovascular: Negative for palpitations and leg swelling.  Gastrointestinal: Positive for abdominal pain. Negative for abdominal distention, blood in stool,  constipation, diarrhea, rectal pain and vomiting.  Genitourinary: Negative for difficulty urinating, dysuria, vaginal discharge and vaginal pain.  Skin: Negative.     Health Maintenance  Topic Date Due  . FOOT EXAM  10/17/1945  . OPHTHALMOLOGY EXAM  07/17/2017  . HEMOGLOBIN A1C  12/30/2018  . MAMMOGRAM  Discontinued    Physical Exam: Vitals:   11/24/18 1420  BP: 140/74  Pulse: 74  Resp: 18  Temp: (!) 97.3 F (36.3 C)  SpO2: 98%  Weight: 109 lb (49.4 kg)  Height: '5\' 4"'$  (1.626 m)   Body mass index is 18.71 kg/m. Physical Exam Vitals signs reviewed.  Constitutional:      Appearance:  Normal appearance.  HENT:     Head: Normocephalic and atraumatic.     Nose: Nose normal.     Mouth/Throat:     Mouth: Mucous membranes are moist.     Pharynx: Oropharynx is clear.  Eyes:     Conjunctiva/sclera: Conjunctivae normal.  Neck:     Musculoskeletal: Normal range of motion and neck supple.  Cardiovascular:     Rate and Rhythm: Normal rate and regular rhythm.     Pulses: Normal pulses.     Heart sounds: Normal heart sounds.  Pulmonary:     Effort: Pulmonary effort is normal.     Breath sounds: Normal breath sounds.  Abdominal:     General: Abdomen is flat. Bowel sounds are normal. There is no distension.     Palpations: Abdomen is soft. There is no mass.     Tenderness: There is no abdominal tenderness. There is no guarding.  Musculoskeletal: Normal range of motion.  Skin:    General: Skin is warm and dry.  Neurological:     General: No focal deficit present.     Mental Status: She is alert and oriented to person, place, and time.     Motor: No weakness.  Psychiatric:        Mood and Affect: Mood normal.        Behavior: Behavior normal.        Thought Content: Thought content normal.        Judgment: Judgment normal.     Labs reviewed: Basic Metabolic Panel: Recent Labs    01/03/18 1348 06/29/18 0811  NA 139 137  K 4.3 4.2  CL 105 106  CO2 22 22  GLUCOSE 81 109*  BUN 21 25  CREATININE 1.33* 1.14*  CALCIUM 9.6 9.7   Liver Function Tests: Recent Labs    01/03/18 1348 06/29/18 0811  AST 18 18  ALT 10 11  BILITOT 0.4 0.4  PROT 7.1 7.1   CBC: Recent Labs    01/03/18 1348 06/29/18 0811  WBC 7.3 7.1  NEUTROABS 4,548 3,309  HGB 10.1* 12.6  HCT 32.2* 39.9  MCV 81.9 79.5*  PLT 318 356   Lipid Panel: Recent Labs    06/29/18 0811  CHOL 220*  HDL 134  LDLCALC 69  TRIG 90  CHOLHDL 1.6   Lab Results  Component Value Date   HGBA1C 6.0 (H) 06/29/2018     Assessment/Plan  1. Abdominal pain, unspecified abdominal location - pain is  midline from upper to lower quadrant  - CBC (no diff) - CMP with eGFR([facility]) - DG Abd 1 View; Future - Culture, Urine, urinalysis  - no abnormalities noted on physical exam, will do the work up     Labs/tests ordered:  CBC, CMP, KUB, urinalysis with culture and sensitivity  Next appt:  12/28/2018  11/25/18  CBC showed hgb 10.4 which dropped from 12.6 (06/29/18). Will check for stool occult blood X 3 and refer to GI.

## 2018-11-24 NOTE — Patient Instructions (Addendum)
Will get CBC and CMP, urinalysis with culture and sensitivity and schedule for  KUB.  Call/follow up if pain persists while doing work up.

## 2018-11-25 ENCOUNTER — Ambulatory Visit
Admission: RE | Admit: 2018-11-25 | Discharge: 2018-11-25 | Disposition: A | Discharge status: Home / Self Care | Payer: Medicare Other | Source: Ambulatory Visit | Attending: Adult Health | Admitting: Adult Health

## 2018-11-25 DIAGNOSIS — R109 Unspecified abdominal pain: Secondary | ICD-10-CM | POA: Diagnosis not present

## 2018-11-25 LAB — COMPLETE METABOLIC PANEL WITH GFR
AG Ratio: 1.8 (calc) (ref 1.0–2.5)
ALT: 9 U/L (ref 6–29)
AST: 20 U/L (ref 10–35)
Albumin: 4.6 g/dL (ref 3.6–5.1)
Alkaline phosphatase (APISO): 84 U/L (ref 37–153)
BUN/Creatinine Ratio: 14 (calc) (ref 6–22)
BUN: 22 mg/dL (ref 7–25)
CO2: 24 mmol/L (ref 20–32)
Calcium: 10 mg/dL (ref 8.6–10.4)
Chloride: 104 mmol/L (ref 98–110)
Creat: 1.53 mg/dL — ABNORMAL HIGH (ref 0.60–0.88)
GFR, Est African American: 37 mL/min/{1.73_m2} — ABNORMAL LOW (ref >=60)
GFR, Est Non African American: 32 mL/min/{1.73_m2} — ABNORMAL LOW (ref >=60)
Globulin: 2.6 g/dL (calc) (ref 1.9–3.7)
Glucose, Bld: 110 mg/dL (ref 65–139)
Potassium: 4.4 mmol/L (ref 3.5–5.3)
Sodium: 138 mmol/L (ref 135–146)
Total Bilirubin: 0.3 mg/dL (ref 0.2–1.2)
Total Protein: 7.2 g/dL (ref 6.1–8.1)

## 2018-11-25 LAB — CBC
HCT: 33.2 % — ABNORMAL LOW (ref 35.0–45.0)
Hemoglobin: 10.4 g/dL — ABNORMAL LOW (ref 11.7–15.5)
MCH: 25.7 pg — ABNORMAL LOW (ref 27.0–33.0)
MCHC: 31.3 g/dL — ABNORMAL LOW (ref 32.0–36.0)
MCV: 82.2 fL (ref 80.0–100.0)
MPV: 10.9 fL (ref 7.5–12.5)
Platelets: 374 10*3/uL (ref 140–400)
RBC: 4.04 10*6/uL (ref 3.80–5.10)
RDW: 15 % (ref 11.0–15.0)
WBC: 9.3 10*3/uL (ref 3.8–10.8)

## 2018-11-25 LAB — URINE CULTURE
MICRO NUMBER:: 970662
Result:: NO GROWTH
SPECIMEN QUALITY:: ADEQUATE

## 2018-11-25 NOTE — Addendum Note (Signed)
Addended by: Durenda Age C on: 11/25/2018 04:10 PM   Modules accepted: Orders

## 2018-11-28 ENCOUNTER — Telehealth: Payer: Self-pay

## 2018-11-28 NOTE — Addendum Note (Signed)
Addended by: Durenda Age C on: 11/28/2018 02:56 PM   Modules accepted: Level of Service

## 2018-11-28 NOTE — Telephone Encounter (Signed)
-----   Message from Nickola Major, NP sent at 11/27/2018  3:18 AM EDT ----- No acute abnormality was noted.

## 2018-11-29 ENCOUNTER — Encounter: Payer: Self-pay | Admitting: Gastroenterology

## 2018-12-05 ENCOUNTER — Other Ambulatory Visit: Payer: Self-pay

## 2018-12-05 DIAGNOSIS — R109 Unspecified abdominal pain: Secondary | ICD-10-CM

## 2018-12-05 DIAGNOSIS — D508 Other iron deficiency anemias: Secondary | ICD-10-CM

## 2018-12-06 LAB — FECAL GLOBIN BY IMMUNOCHEMISTRY
FECAL GLOBIN RESULT:: NOT DETECTED
MICRO NUMBER:: 1004953
SPECIMEN QUALITY:: ADEQUATE

## 2018-12-07 DIAGNOSIS — D485 Neoplasm of uncertain behavior of skin: Secondary | ICD-10-CM | POA: Diagnosis not present

## 2018-12-07 DIAGNOSIS — L570 Actinic keratosis: Secondary | ICD-10-CM | POA: Diagnosis not present

## 2018-12-07 DIAGNOSIS — R208 Other disturbances of skin sensation: Secondary | ICD-10-CM | POA: Diagnosis not present

## 2018-12-08 ENCOUNTER — Encounter: Payer: Self-pay | Admitting: Gastroenterology

## 2018-12-08 ENCOUNTER — Ambulatory Visit (INDEPENDENT_AMBULATORY_CARE_PROVIDER_SITE_OTHER): Payer: Medicare Other | Admitting: Gastroenterology

## 2018-12-08 ENCOUNTER — Other Ambulatory Visit (INDEPENDENT_AMBULATORY_CARE_PROVIDER_SITE_OTHER): Payer: Medicare Other

## 2018-12-08 ENCOUNTER — Other Ambulatory Visit: Payer: Self-pay

## 2018-12-08 VITALS — BP 150/80 | HR 80 | Temp 98.2°F | Ht 63.0 in | Wt 111.5 lb

## 2018-12-08 DIAGNOSIS — D649 Anemia, unspecified: Secondary | ICD-10-CM

## 2018-12-08 DIAGNOSIS — R350 Frequency of micturition: Secondary | ICD-10-CM | POA: Diagnosis not present

## 2018-12-08 DIAGNOSIS — R3915 Urgency of urination: Secondary | ICD-10-CM | POA: Diagnosis not present

## 2018-12-08 LAB — FOLATE: Folate: >24.1 ng/mL (ref >5.9)

## 2018-12-08 LAB — IBC PANEL
Iron: 46 ug/dL (ref 42–145)
Saturation Ratios: 8.1 % — ABNORMAL LOW (ref 20.0–50.0)
Transferrin: 408 mg/dL — ABNORMAL HIGH (ref 212.0–360.0)

## 2018-12-08 LAB — VITAMIN B12: Vitamin B-12: 82 pg/mL — ABNORMAL LOW (ref 211–911)

## 2018-12-08 LAB — FERRITIN: Ferritin: 7.1 ng/mL — ABNORMAL LOW (ref 10.0–291.0)

## 2018-12-08 NOTE — Patient Instructions (Signed)
Your provider has requested that you go to the basement level for lab work before leaving today. Press "B" on the elevator. The lab is located at the first door on the left as you exit the elevator.  

## 2018-12-08 NOTE — Progress Notes (Signed)
12/08/2018 [name] [id number] [birth year]   HISTORY OF PRESENT ILLNESS:  This is an 83 year old female sent here for evaluation of anemia.  Recent hemoglobin of 10.4 g compared to 12.6 g five months ago, but her normal tends to be between 8.5 and 10.5 g for the past 4 years at least.  She is Hemoccult negative and denies any sign of overt bleeding.  She was also heme-negative 4 years ago as well.  Does appear to have some degree of chronic kidney disease.  She tells me that she has actually been anemic most of her life and has taken iron supplements on and off in the past, but does not tolerate them well.  Had several surgeries for perforated gastric ulcer in 1970s so some of her issue may be absorption related to that.  She has not had colonoscopy except for around the time of her surgeries when she was probably 83 years old.  She adamantly does not want another one and says that she would "almost rather die".  Her weight is stable, actually up some recently as well she says.  She was having some lower abdominal and periumbilical abdominal pain a couple of weeks ago, but that has resolved.   Past Medical History:  Diagnosis Date  . Age-related macular degeneration, dry, both eyes   . Alcoholism ([place])   . Anemia   . Anxiety   . Cholelithiasis   . Endometriosis   . Goiter   . Herpes simplex   . Hyperlipidemia   . Hypertension   . Loss of weight   . Nontoxic multinodular goiter   . Other abnormal blood chemistry   . PUD (peptic ulcer disease)   . Renal cyst   . Sciatica   . Scoliosis (and kyphoscoliosis), idiopathic   . Unspecified disorder of kidney and ureter   . Urinary frequency    Past Surgical History:  Procedure Laterality Date  . ABDOMINAL HYSTERECTOMY  1971  . CATARACT EXTRACTION W/ INTRAOCULAR LENS  IMPLANT, BILATERAL Bilateral 2016   Dr. Rutherford Guys  . IRRIGATION AND DEBRIDEMENT ABSCESS Left 02/23/2017   Procedure: IRRIGATION AND DEBRIDEMENT ABSCESS;  Surgeon:  Mcarthur Rossetti, MD;  Location: [facility];  Service: Orthopedics;  Laterality: Left;  . REPAIR OF PERFORATED ULCER  1973 &1975   Dr Vida Rigger  . STOMACH SURGERY  [id number]   removed 1/2 stomach; "had a total of 5 ORs from 1973-1980; all related to perforated ulcer"  . TONSILLECTOMY      reports that she quit smoking about 24 years ago. Her smoking use included cigarettes. She has a 40.00 pack-year smoking history. She has never used smokeless tobacco. She reports that she does not drink alcohol or use drugs. family history includes Obesity in her mother. No Known Allergies    Outpatient Encounter Medications as of 12/08/2018  Medication Sig  . ALPRAZolam (XANAX) 0.25 MG tablet TAKE 1 TABLET BY MOUTH AT BEDTIME  . BYSTOLIC 10 MG tablet TAKE 1/2 TABLET BY MOUTH ONCE DAILY  . EXFORGE 10-320 MG tablet TAKE 1 TABLET BY MOUTH EVERY DAY  . gabapentin (NEURONTIN) 100 MG capsule Take 1 capsule (100 mg total) by mouth at bedtime.  Marland Kitchen MYRBETRIQ 25 MG TB24 tablet Take 25 mg by mouth at bedtime.    No facility-administered encounter medications on file as of 12/08/2018.      REVIEW OF SYSTEMS  : All other systems reviewed and negative except where noted in the History of Present  Illness.   PHYSICAL EXAM: Temp 98.2 F (36.8 C)   Ht 5\' 3"  (1.6 m) Comment: height measured without shoes  Wt 111 lb 8 oz (50.6 kg)   BMI 19.75 kg/m  General: Well developed white female in no acute distress Head: Normocephalic and atraumatic Eyes:  Sclerae anicteric, conjunctiva pink. Ears: Normal auditory acuity Lungs: Clear throughout to auscultation; no increased WOB. Heart: Regular rate and rhythm; no M/R/G. Abdomen: Soft, non-distended.  BS present.  Non-tender.  Mid-line scars noted on abdomen. Musculoskeletal: Symmetrical with no gross deformities  Skin: No lesions on visible extremities Extremities: No edema  Neurological: Alert oriented x 4, grossly non-focal Psychological:  Alert and cooperative.  Normal mood and affect  ASSESSMENT AND PLAN: *83 year old female sent here for evaluation of anemia.  Recent hemoglobin of 10.4 g compared to 12.6 g five months ago, but her normal tends to be between 8.5 and 10.5 g for the past 4 years at least.  She is Hemoccult negative and denies any sign of overt bleeding.  She was also heme-negative 4 years ago as well.  Does appear to have some degree of chronic kidney disease.  She tells me that she has actually been anemic most of her life and has taken iron supplements on and off in the past, but does not tolerate them well.  Had several surgeries for perforated gastric ulcer in 1970s so some of her issue may be absorption related to that.  She has not had colonoscopy except for around the time of her surgeries when she was probably 83 years old.  She adamantly does not want another one.  Once again, no overt sign of bleeding and Hemoccult negative.  I am going to check iron levels today along with B12 and folate.  If her iron levels are low then we could schedule for IV iron infusions as she has been intolerant to oral iron in the past and this may be more of an absorption issue so the IV iron may be a better option. *Periumbilical/lower abdominal pain: This occurred a couple of weeks ago and has now resolved.  If recurs then could certainly consider CT scan, which PCP could certainly order as well.   CC:  Medina-Vargas, Monina C*

## 2018-12-08 NOTE — Progress Notes (Signed)
____________________________________________________________  Attending physician addendum:  Thank you for sending this case to me. I have reviewed the entire note, and the outlined plan seems appropriate.  Yes, some additional labs would be helpful , focus on iron replacement if patient does not desire endoscopic workup. ? BUN and creatinine checked.  Wilfrid Lund, MD  ____________________________________________________________

## 2018-12-09 ENCOUNTER — Other Ambulatory Visit: Payer: Self-pay

## 2018-12-09 DIAGNOSIS — D649 Anemia, unspecified: Secondary | ICD-10-CM

## 2018-12-13 ENCOUNTER — Other Ambulatory Visit: Payer: Self-pay

## 2018-12-13 DIAGNOSIS — G629 Polyneuropathy, unspecified: Secondary | ICD-10-CM | POA: Diagnosis not present

## 2018-12-13 DIAGNOSIS — Z66 Do not resuscitate: Secondary | ICD-10-CM | POA: Diagnosis not present

## 2018-12-13 DIAGNOSIS — Z79899 Other long term (current) drug therapy: Secondary | ICD-10-CM | POA: Diagnosis not present

## 2018-12-13 DIAGNOSIS — Z87891 Personal history of nicotine dependence: Secondary | ICD-10-CM | POA: Diagnosis not present

## 2018-12-13 DIAGNOSIS — R0789 Other chest pain: Secondary | ICD-10-CM | POA: Diagnosis not present

## 2018-12-13 DIAGNOSIS — D649 Anemia, unspecified: Secondary | ICD-10-CM

## 2018-12-13 DIAGNOSIS — Z8673 Personal history of transient ischemic attack (TIA), and cerebral infarction without residual deficits: Secondary | ICD-10-CM | POA: Diagnosis not present

## 2018-12-13 DIAGNOSIS — I10 Essential (primary) hypertension: Secondary | ICD-10-CM | POA: Diagnosis not present

## 2018-12-13 DIAGNOSIS — R079 Chest pain, unspecified: Secondary | ICD-10-CM | POA: Diagnosis not present

## 2018-12-13 DIAGNOSIS — I209 Angina pectoris, unspecified: Secondary | ICD-10-CM | POA: Diagnosis not present

## 2018-12-13 DIAGNOSIS — N3281 Overactive bladder: Secondary | ICD-10-CM | POA: Diagnosis not present

## 2018-12-13 DIAGNOSIS — M25512 Pain in left shoulder: Secondary | ICD-10-CM | POA: Diagnosis not present

## 2018-12-13 DIAGNOSIS — Z8711 Personal history of peptic ulcer disease: Secondary | ICD-10-CM | POA: Diagnosis not present

## 2018-12-14 DIAGNOSIS — Z79899 Other long term (current) drug therapy: Secondary | ICD-10-CM | POA: Diagnosis not present

## 2018-12-14 DIAGNOSIS — Z66 Do not resuscitate: Secondary | ICD-10-CM | POA: Diagnosis not present

## 2018-12-14 DIAGNOSIS — M25512 Pain in left shoulder: Secondary | ICD-10-CM | POA: Diagnosis not present

## 2018-12-14 DIAGNOSIS — N3281 Overactive bladder: Secondary | ICD-10-CM | POA: Diagnosis not present

## 2018-12-14 DIAGNOSIS — G629 Polyneuropathy, unspecified: Secondary | ICD-10-CM | POA: Diagnosis not present

## 2018-12-14 DIAGNOSIS — R0789 Other chest pain: Secondary | ICD-10-CM | POA: Diagnosis not present

## 2018-12-14 DIAGNOSIS — Z87891 Personal history of nicotine dependence: Secondary | ICD-10-CM | POA: Diagnosis not present

## 2018-12-14 DIAGNOSIS — Z8673 Personal history of transient ischemic attack (TIA), and cerebral infarction without residual deficits: Secondary | ICD-10-CM | POA: Diagnosis not present

## 2018-12-14 DIAGNOSIS — I10 Essential (primary) hypertension: Secondary | ICD-10-CM | POA: Diagnosis not present

## 2018-12-14 DIAGNOSIS — Z8711 Personal history of peptic ulcer disease: Secondary | ICD-10-CM | POA: Diagnosis not present

## 2018-12-16 ENCOUNTER — Telehealth: Payer: Self-pay | Admitting: Gastroenterology

## 2018-12-16 ENCOUNTER — Telehealth: Payer: Self-pay | Admitting: *Deleted

## 2018-12-16 NOTE — Telephone Encounter (Signed)
Patient dropped off note stating that she was in [place] and ended up having to go to the hospital due to chest/heart discomfort on 12/13/2018. Patient was at [facility]. Note stated that while at [facility] she had a stress test on 10/28.   Tried calling patient to follow up TOC and schedule a follow up  [facility] to return call

## 2018-12-16 NOTE — Telephone Encounter (Signed)
The pt was provided the number to call the Patient [place] and change her appt as needed

## 2018-12-17 NOTE — Telephone Encounter (Signed)
Ok, if you can get hold of her, she should bring any paperwork they gave her there so we can request the full records.  I'm not seeing anything pop up in care everywhere so they may not have epic there.

## 2018-12-19 NOTE — Telephone Encounter (Signed)
LMOM for patient to Return call.  

## 2018-12-19 NOTE — Telephone Encounter (Signed)
Transition Care Management Follow-up Telephone Call  Date of discharge and from where: 12/14/2018 [facility] due to Chest Discomfort  How have you been since you were released from the hospital? Good, all tests Negative Any questions or concerns? No   Patient is calling the hospital to request records to be faxed to our office, fax number given.  Items Reviewed:  Did the pt receive and understand the discharge instructions provided? Yes   Medications obtained and verified? Yes   Any new allergies since your discharge? No   Dietary orders reviewed? Yes  Do you have support at home? Yes   Other (ie: DME, Home Health, etc) No  Functional Questionnaire: (I = Independent and D = Dependent) ADL's: I  Bathing/Dressing- I   Meal Prep- I  Eating- I  Maintaining continence- I  Transferring/Ambulation- I  Managing Meds- I   Follow up appointments reviewed:    PCP Hospital f/u appt confirmed? Yes  Scheduled to see Dinah on 12/21/2018 .  [facility] f/u appt confirmed? No .  Are transportation arrangements needed? No   If their condition worsens, is the pt aware to call  their PCP or go to the ED? Yes  Was the patient provided with contact information for the PCP's office or ED? Yes  Was the pt encouraged to call back with questions or concerns? Yes

## 2018-12-20 ENCOUNTER — Ambulatory Visit (HOSPITAL_COMMUNITY): Payer: Medicare Other

## 2018-12-21 ENCOUNTER — Ambulatory Visit (INDEPENDENT_AMBULATORY_CARE_PROVIDER_SITE_OTHER): Payer: Medicare Other | Admitting: Family

## 2018-12-21 ENCOUNTER — Other Ambulatory Visit: Payer: Self-pay

## 2018-12-21 ENCOUNTER — Encounter: Payer: Self-pay | Admitting: Family

## 2018-12-21 VITALS — BP 144/78 | HR 72 | Temp 97.5°F | Ht 63.0 in | Wt 111.4 lb

## 2018-12-21 DIAGNOSIS — I10 Essential (primary) hypertension: Secondary | ICD-10-CM | POA: Diagnosis not present

## 2018-12-21 DIAGNOSIS — D508 Other iron deficiency anemias: Secondary | ICD-10-CM

## 2018-12-21 DIAGNOSIS — G8929 Other chronic pain: Secondary | ICD-10-CM

## 2018-12-21 DIAGNOSIS — M25512 Pain in left shoulder: Secondary | ICD-10-CM | POA: Diagnosis not present

## 2018-12-21 NOTE — Progress Notes (Signed)
°Provider: Dinah Ngetich FNP-C ° °Reed, Tiffany L, DO ° °Patient Care Team: °Reed, Tiffany L, DO as PCP - General (Geriatric Medicine) °Shapiro, Mark, MD as Consulting Physician (Ophthalmology) °Wein, Robert, MD as Consulting Physician (Obstetrics and Gynecology) ° °Extended Emergency Contact Information °Primary Emergency Contact: Parker,Deborah ° [place] °Mobile Phone: [phone] °Relation: Friend °Secondary Emergency Contact: Wayt, Merritt °Mobile Phone: [phone] °Relation: Significant other ° °Code Status: Full Code  °Goals of care: Advanced Directive information °Advanced Directives 11/24/2018  °Does Patient Have a Medical Advance Directive? Yes  °Type of Advance Directive Healthcare Power of Attorney;Living will  °Does patient want to make changes to medical advance directive? No - Patient declined  °Copy of Healthcare Power of Attorney in Chart? Yes - validated most recent copy scanned in chart (See row information)  °Would patient like information on creating a medical advance directive? -  ° ° ° °Chief Complaint  °Patient presents with  °• Transitions Of Care  °  Hospitalization from 12/13/2018 - 12/14/2018 for chest discomfort  patient states would like to discuss new diagnosis of neuropathy. Patient states would like more information about medication before she goes to see podiatrist next week.     ° ° °HPI:  °Pt is a 83 y.o. female seen today for an acute visit for transition of care post hospital admission from 12/13/2018-12/14/2018 for chest discomfort.she presented at [facility] after waking up at 0400 with a brief,sharp,stabbing left shoulder pain that radiated to her chest,right shoulder and back.Pain quickly evolved into constant dull chest discomfort rating 3/10 on scale.she applied lidocaine patch to left shoulder and took Tums without any relief.she was treated with 2-3 Nitroglycerin tablet in the ED and chest pain resolved.Her work up at the ED including CXR,troponin were  negative.Lab work was unremarkable.Her EKG showed mild ST depression in lateral leads  Along with some flat T waves in V2 and V3.she was transferred to [facility].she had a myocardial perfusion scan which showed no evidence of ischemia with her left ventricular EF at 89%.Her chest discomfort was thought to be most like due to her shoulder and neck pain coming from her cervical radiculopathy.Outpatient cervical spine workup including X-ray and CT-scan of the cervical spine was recommended.she advised to take tylenol 500 mg tablet every 6 hrs as needed for pain. °She states since discharge from the hospital she has had no chest pain.Discussed with her concerning her shoulder and neck pain and reccommendedation for cervical spine x-ray and CT scan but she has declined.she states does not think the pain was from her shoulder or neck.she states does not have any pain.Declined any further work up for now.she declines referral to cardiologist. ° °She states was also started on Gabapentin 100 mg tablet at bedtime  by her Podiatrist Dr.Hyatt Max.she would like to know whether this should be managed by PCP or podiatrist.I've discussed with her that PCP can also refill her medication.Also inquires how neuropathy is diagnosed.discussed with patient that depending on symptoms that she present with along with examination a diagnosis is made.she has an upcoming appointment with podiatrist.  ° °She states has upcoming fasting lab work with Dr.Reed but would like to recheck her anemia and kidney functions. ° °Medication reconciliation completed.  ° ° °Past Medical History:  °Diagnosis Date  °• Age-related macular degeneration, dry, both eyes   °• Alcoholism (HCC)   °• Anemia   °• Anxiety   °• Cholelithiasis   °• Endometriosis   °• Goiter   °•   Herpes simplex   °• Hyperlipidemia   °• Hypertension   °• Loss of weight   °• Nontoxic multinodular goiter   °• Other abnormal blood chemistry   °• PUD (peptic ulcer disease)   °•  Renal cyst   °• Sciatica   °• Scoliosis (and kyphoscoliosis), idiopathic   °• Unspecified disorder of kidney and ureter   °• Urinary frequency   ° °Past Surgical History:  °Procedure Laterality Date  °• ABDOMINAL HYSTERECTOMY  1971  °• CATARACT EXTRACTION W/ INTRAOCULAR LENS  IMPLANT, BILATERAL Bilateral 2016  ° Dr. Mark Shapiro  °• IRRIGATION AND DEBRIDEMENT ABSCESS Left 02/23/2017  ° Procedure: IRRIGATION AND DEBRIDEMENT ABSCESS;  Surgeon: Blackman, Christopher Y, MD;  Location: [facility] OR;  Service: Orthopedics;  Laterality: Left;  °• REPAIR OF PERFORATED ULCER  1973 &1975  ° Dr Farley  °• STOMACH SURGERY  1977-1980  ° removed 1/2 stomach; "had a total of 5 ORs from 1973-1980; all related to perforated ulcer"  °• TONSILLECTOMY    ° ° °No Known Allergies ° °Outpatient Encounter Medications as of 12/21/2018  °Medication Sig  °• ALPRAZolam (XANAX) 0.25 MG tablet TAKE 1 TABLET BY MOUTH AT BEDTIME  °• BYSTOLIC 10 MG tablet TAKE 1/2 TABLET BY MOUTH ONCE DAILY  °• EXFORGE 10-320 MG tablet TAKE 1 TABLET BY MOUTH EVERY DAY  °• gabapentin (NEURONTIN) 100 MG capsule Take 1 capsule (100 mg total) by mouth at bedtime.  °• MYRBETRIQ 25 MG TB24 tablet Take 25 mg by mouth at bedtime.   ° °No facility-administered encounter medications on file as of 12/21/2018.   ° ° °Review of Systems  °Constitutional: Negative for appetite change, chills, fatigue and fever.  °HENT: Negative for congestion, rhinorrhea, sinus pressure, sinus pain, sneezing and sore throat.   °Eyes: Positive for visual disturbance. Negative for discharge, redness and itching.  °     Wears eye glasses   °Respiratory: Negative for cough, chest tightness, shortness of breath and wheezing.   °Cardiovascular: Negative for chest pain, palpitations and leg swelling.  °Gastrointestinal: Negative for abdominal distention, abdominal pain, constipation, diarrhea, nausea and vomiting.  °Genitourinary: Negative for difficulty urinating, dysuria, flank pain, frequency and urgency.    °Musculoskeletal: Negative for arthralgias, gait problem, neck pain and neck stiffness.  °Skin: Negative for color change, pallor and rash.  °Neurological: Negative for dizziness, light-headedness and headaches.  °     Neuropathy on feet   °Psychiatric/Behavioral: Negative for agitation, behavioral problems and sleep disturbance. The patient is nervous/anxious.   ° ° ° °There is no immunization history on file for this patient. °Pertinent  Health Maintenance Due  °Topic Date Due  °• FOOT EXAM  10/17/1945  °• OPHTHALMOLOGY EXAM  07/17/2017  °• HEMOGLOBIN A1C  12/30/2018  °• MAMMOGRAM  Discontinued  ° °Fall Risk  12/21/2018 11/24/2018 09/12/2018 07/04/2018 05/06/2018  °Falls in the past year? 0 0 0 0 0  °Number falls in past yr: 0 0 0 0 0  °Injury with Fall? 0 - 0 0 0  °Follow up - - - - Falls evaluation completed  ° ° °Vitals:  ° 12/21/18 1100  °BP: (!) 144/78  °Pulse: 72  °Temp: (!) 97.5 °F (36.4 °C)  °TempSrc: Temporal  °SpO2: 97%  °Weight: 111 lb 6.4 oz (50.5 kg)  °Height: 5' 3" (1.6 m)  ° °Body mass index is 19.73 kg/m². °Physical Exam °Constitutional:   °   General: She is not in acute distress. °   Appearance: She is normal weight.   She is not ill-appearing.  °HENT:  °   Head: Normocephalic.  °   Mouth/Throat:  °   Mouth: Mucous membranes are moist.  °   Pharynx: Oropharynx is clear. No oropharyngeal exudate or posterior oropharyngeal erythema.  °Eyes:  °   General: No scleral icterus.    °   Right eye: No discharge.     °   Left eye: No discharge.  °   Extraocular Movements: Extraocular movements intact.  °   Conjunctiva/sclera: Conjunctivae normal.  °   Pupils: Pupils are equal, round, and reactive to light.  °   Comments: Corrective lens in place   °Neck:  °   Musculoskeletal: Normal range of motion. No neck rigidity or muscular tenderness.  °   Vascular: No carotid bruit.  °Cardiovascular:  °   Rate and Rhythm: Normal rate and regular rhythm.  °   Pulses: Normal pulses.  °   Heart sounds: Normal heart sounds. No  murmur. No friction rub. No gallop.   °Pulmonary:  °   Effort: Pulmonary effort is normal. No respiratory distress.  °   Breath sounds: Normal breath sounds. No wheezing, rhonchi or rales.  °Chest:  °   Chest wall: No tenderness.  °Abdominal:  °   General: Bowel sounds are normal. There is no distension.  °   Palpations: Abdomen is soft. There is no mass.  °   Tenderness: There is no abdominal tenderness. There is no right CVA tenderness, left CVA tenderness, guarding or rebound.  °Musculoskeletal:     °   General: No swelling or tenderness.  °   Right lower leg: No edema.  °   Left lower leg: No edema.  °   Comments: Limited ROM to left shoulder  °Lymphadenopathy:  °   Cervical: No cervical adenopathy.  °Skin: °   General: Skin is warm and dry.  °   Coloration: Skin is not pale.  °   Findings: No bruising, erythema or rash.  °Neurological:  °   Mental Status: She is alert and oriented to person, place, and time.  °   Cranial Nerves: No cranial nerve deficit.  °   Motor: No weakness.  °   Coordination: Coordination normal.  °Psychiatric:     °   Mood and Affect: Mood normal.     °   Behavior: Behavior normal.     °   Thought Content: Thought content normal.     °   Judgment: Judgment normal.  ° ° °Labs reviewed: °Recent Labs  °  01/03/18 °1348 06/29/18 °0811 11/24/18 °1521  °NA 139 137 138  °K 4.3 4.2 4.4  °CL 105 106 104  °CO2 22 22 24  °GLUCOSE 81 109* 110  °BUN 21 25 22  °CREATININE 1.33* 1.14* 1.53*  °CALCIUM 9.6 9.7 10.0  ° °Recent Labs  °  01/03/18 °1348 06/29/18 °0811 11/24/18 °1521  °AST 18 18 20  °ALT 10 11 9  °BILITOT 0.4 0.4 0.3  °PROT 7.1 7.1 7.2  ° °Recent Labs  °  01/03/18 °1348 06/29/18 °0811 11/24/18 °1521  °WBC 7.3 7.1 9.3  °NEUTROABS 4,548 3,309  --   °HGB 10.1* 12.6 10.4*  °HCT 32.2* 39.9 33.2*  °MCV 81.9 79.5* 82.2  °PLT 318 356 374  ° °Lab Results  °Component Value Date  ° TSH 0.64 07/01/2017  ° °Lab Results  °Component Value Date  ° HGBA1C 6.0 (H) 06/29/2018  ° °Lab Results  °Component Value    Date   CHOL 220 (H) 06/29/2018   HDL 134 06/29/2018   LDLCALC 69 06/29/2018   TRIG 90 06/29/2018   CHOLHDL 1.6 06/29/2018    Significant Diagnostic Results in last 30 days:  Dg Abd 1 View  Result Date: 11/25/2018 CLINICAL DATA:  Abdominal pain for several days, initial encounter EXAM: ABDOMEN - 1 VIEW COMPARISON:  None. FINDINGS: Scattered large and small bowel gas is noted. Postsurgical changes are seen. Mild degenerative changes of lumbar spine are noted. No free air is seen no mass lesion is noted. IMPRESSION: No acute abnormality noted. Electronically Signed   By: Inez Catalina M.D.   On: 11/25/2018 16:10    Assessment/Plan 1. Essential hypertension, benign B/p not at goal.continue on Exforge and bystolic. - BMP with eGFR([facility]); Future - CBC with Differential/Platelet; Future  2. Iron deficiency anemia secondary to inadequate dietary iron intake Latest Hgb 10.3 previous 10.4 declines use of any iron supplement.Has had work up for anemia with PCP.encouraged to increase iron rich foods such as dark leafy veggies and beets.  - CBC with Differential/Platelet; Future  3. Chronic left shoulder pain Chronic.continue on Tylenol 500 mg tablet one by mouth every 6 hours as needed for pain.   4. Anxiety  Stable.continue on Alprazolam 0.25 mg tablet daily as needed   5. Peripheral Neuropathy Gabapentin 100 mg capsule at bedtime effective.continue to follow up with Podiatrist as directed.    Family/ staff Communication: Reviewed plan of care with patient   Labs/tests ordered: - BMP with eGFR([facility]); Future - CBC with Differential/Platelet; Future  Sandrea Hughs, NP

## 2018-12-27 ENCOUNTER — Ambulatory Visit (INDEPENDENT_AMBULATORY_CARE_PROVIDER_SITE_OTHER): Payer: Medicare Other | Admitting: Podiatry

## 2018-12-27 ENCOUNTER — Other Ambulatory Visit: Payer: Self-pay

## 2018-12-27 DIAGNOSIS — G5792 Unspecified mononeuropathy of left lower limb: Secondary | ICD-10-CM | POA: Diagnosis not present

## 2018-12-27 DIAGNOSIS — M79672 Pain in left foot: Secondary | ICD-10-CM

## 2018-12-28 ENCOUNTER — Encounter: Payer: Self-pay | Admitting: Podiatry

## 2018-12-28 ENCOUNTER — Other Ambulatory Visit: Payer: Medicare Other

## 2018-12-28 DIAGNOSIS — R739 Hyperglycemia, unspecified: Secondary | ICD-10-CM

## 2018-12-28 DIAGNOSIS — I10 Essential (primary) hypertension: Secondary | ICD-10-CM | POA: Diagnosis not present

## 2018-12-28 DIAGNOSIS — D508 Other iron deficiency anemias: Secondary | ICD-10-CM | POA: Diagnosis not present

## 2018-12-28 NOTE — Progress Notes (Signed)
Subjective:  Patient ID: [name], female    DOB: [birth year],  MRN: [id number]  Chief Complaint  Patient presents with  . Foot Pain    pt is here for a f/u on neuropathy, pt states that she is concerned about the process of being diagnosed with neuropathy    83 y.o. female presents with the above complaint.  Patient states that she is here for follow-up for diagnosis of neuropathy.  She states that she was diagnosed by Dr. Laureen Ochs neuropathy of bilateral feet.  She was started on gabapentin.  However the gabapentin has not helped.  And therefore she wanted to get more information on the diagnosis of neuropathy.  She was also interested in if there were any neuropathy centers in [place].  She denies any other acute complaints.  She also had a skin biopsy/punch biopsy done by dermatologist for skin lesion on the dorsal aspect of the left foot.   Review of Systems: Negative except as noted in the HPI. Denies N/V/F/Ch.  Past Medical History:  Diagnosis Date  . Age-related macular degeneration, dry, both eyes   . Alcoholism ([place])   . Anemia   . Anxiety   . Cholelithiasis   . Endometriosis   . Goiter   . Herpes simplex   . Hyperlipidemia   . Hypertension   . Loss of weight   . Nontoxic multinodular goiter   . Other abnormal blood chemistry   . PUD (peptic ulcer disease)   . Renal cyst   . Sciatica   . Scoliosis (and kyphoscoliosis), idiopathic   . Unspecified disorder of kidney and ureter   . Urinary frequency     Current Outpatient Medications:  .  ALPRAZolam (XANAX) 0.25 MG tablet, TAKE 1 TABLET BY MOUTH AT BEDTIME, Disp: 30 tablet, Rfl: 5 .  BYSTOLIC 10 MG tablet, TAKE 1/2 TABLET BY MOUTH ONCE DAILY, Disp: 45 tablet, Rfl: 1 .  EXFORGE 10-320 MG tablet, TAKE 1 TABLET BY MOUTH EVERY DAY, Disp: 180 tablet, Rfl: 1 .  gabapentin (NEURONTIN) 100 MG capsule, Take 1 capsule (100 mg total) by mouth at bedtime., Disp: 30 capsule, Rfl: 3 .  MYRBETRIQ 25 MG TB24 tablet, Take 25 mg by  mouth at bedtime. , Disp: , Rfl:   Social History   Tobacco Use  Smoking Status Former Smoker  . Packs/day: 1.00  . Years: 40.00  . Pack years: 40.00  . Types: Cigarettes  . Quit date: 01/09/1994  . Years since quitting: 24.9  Smokeless Tobacco Never Used    No Known Allergies Objective:  There were no vitals filed for this visit. There is no height or weight on file to calculate BMI. Constitutional Well developed. Well nourished.  Vascular Dorsalis pedis pulses palpable bilaterally. Posterior tibial pulses palpable bilaterally. Capillary refill normal to all digits.  No cyanosis or clubbing noted. Pedal hair growth normal.  Neurologic Normal speech. Oriented to person, place, and time. Left foot: Negative Tinel's sign phalanx sign on percussion of the dorsal medial cutaneous nerve left foot.  Shooting pain noted across second and third digit dorsal foot. No neurologic symptoms on the right foot.  Dermatologic Nails well groomed and normal in appearance. Left foot dorsal punch biopsy site done by another provider.  It appears to be healing well. No skin lesions.  Orthopedic:  Manual muscle testing 5 out of 5.  Mild hammertoe contractures noted of bilateral digits.   Radiographs: None Assessment:   1. Peripheral neuritis of left foot  2. Left foot pain   3. Nerve entrapment syndrome of foot, left    Plan:  Patient was evaluated and treated and all questions answered.  Left foot nerve entrapment/peripheral neuritis second and third digit dorsal -I believe that patient is experiencing the symptoms secondary to a punch biopsy that was done on the left dorsal foot.  I explained to the patient that this is a common sequela of undergoing punch biopsy in that area.  As there is a cutaneous nerves around the right across the dorsal aspect of the midfoot. -I explained to the patient that this will resolve on its own as soon as the punch biopsy site is healed.  The nerve healing  can take up to a year to completely heal. -She states that she will continue to take gabapentin at is does slightly help with the pain.  I explained to her that she can continue take gabapentin as long as she still has pain.  Return if symptoms worsen or fail to improve.

## 2018-12-29 LAB — CBC WITH DIFFERENTIAL/PLATELET
Absolute Monocytes: 602 cells/uL (ref 200–950)
Basophils Absolute: 70 cells/uL (ref 0–200)
Basophils Relative: 1 %
Eosinophils Absolute: 161 cells/uL (ref 15–500)
Eosinophils Relative: 2.3 %
HCT: 29.6 % — ABNORMAL LOW (ref 35.0–45.0)
Hemoglobin: 9.4 g/dL — ABNORMAL LOW (ref 11.7–15.5)
Lymphs Abs: 2401 cells/uL (ref 850–3900)
MCH: 25.8 pg — ABNORMAL LOW (ref 27.0–33.0)
MCHC: 31.8 g/dL — ABNORMAL LOW (ref 32.0–36.0)
MCV: 81.1 fL (ref 80.0–100.0)
MPV: 10.6 fL (ref 7.5–12.5)
Monocytes Relative: 8.6 %
Neutro Abs: 3766 cells/uL (ref 1500–7800)
Neutrophils Relative %: 53.8 %
Platelets: 347 10*3/uL (ref 140–400)
RBC: 3.65 10*6/uL — ABNORMAL LOW (ref 3.80–5.10)
RDW: 13.9 % (ref 11.0–15.0)
Total Lymphocyte: 34.3 %
WBC: 7 10*3/uL (ref 3.8–10.8)

## 2018-12-29 LAB — HEMOGLOBIN A1C
Hgb A1c MFr Bld: 5.8 % of total Hgb — ABNORMAL HIGH (ref <5.7)
Mean Plasma Glucose: 120 (calc)
eAG (mmol/L): 6.6 (calc)

## 2018-12-29 LAB — BASIC METABOLIC PANEL WITH GFR
BUN/Creatinine Ratio: 14 (calc) (ref 6–22)
BUN: 21 mg/dL (ref 7–25)
CO2: 21 mmol/L (ref 20–32)
Calcium: 9.5 mg/dL (ref 8.6–10.4)
Chloride: 107 mmol/L (ref 98–110)
Creat: 1.51 mg/dL — ABNORMAL HIGH (ref 0.60–0.88)
GFR, Est African American: 37 mL/min/{1.73_m2} — ABNORMAL LOW (ref >=60)
GFR, Est Non African American: 32 mL/min/{1.73_m2} — ABNORMAL LOW (ref >=60)
Glucose, Bld: 100 mg/dL — ABNORMAL HIGH (ref 65–99)
Potassium: 4.8 mmol/L (ref 3.5–5.3)
Sodium: 137 mmol/L (ref 135–146)

## 2019-01-02 ENCOUNTER — Other Ambulatory Visit: Payer: Self-pay

## 2019-01-02 ENCOUNTER — Encounter: Payer: Self-pay | Admitting: Internal Medicine

## 2019-01-02 ENCOUNTER — Ambulatory Visit (INDEPENDENT_AMBULATORY_CARE_PROVIDER_SITE_OTHER): Payer: Medicare Other | Admitting: Internal Medicine

## 2019-01-02 VITALS — BP 120/70 | HR 78 | Temp 97.9°F | Resp 18 | Ht 63.0 in | Wt 112.2 lb

## 2019-01-02 DIAGNOSIS — H3530 Unspecified macular degeneration: Secondary | ICD-10-CM

## 2019-01-02 DIAGNOSIS — F1021 Alcohol dependence, in remission: Secondary | ICD-10-CM

## 2019-01-02 DIAGNOSIS — D518 Other vitamin B12 deficiency anemias: Secondary | ICD-10-CM | POA: Diagnosis not present

## 2019-01-02 DIAGNOSIS — N1832 Chronic kidney disease, stage 3b: Secondary | ICD-10-CM | POA: Diagnosis not present

## 2019-01-02 DIAGNOSIS — N3281 Overactive bladder: Secondary | ICD-10-CM | POA: Diagnosis not present

## 2019-01-02 DIAGNOSIS — L299 Pruritus, unspecified: Secondary | ICD-10-CM | POA: Diagnosis not present

## 2019-01-02 MED ORDER — CYANOCOBALAMIN 1000 MCG/ML IJ SOLN: 1000.0000 ug | INTRAMUSCULAR | 11 refills | Status: DC

## 2019-01-02 NOTE — Patient Instructions (Signed)
Start B12 injections monthly I expect your anemia should improve considerably with these If this remains an issue, you may pursue the iron infusion at GI We will check a urinalysis today to evaluate your kidneys more. I am going to get the notes from [facility] to find out if they did a renal ultrasound there.  If note, we'll arrange one at [facility] to look at the appearance of your kidneys and we'll let you know.

## 2019-01-02 NOTE — Progress Notes (Signed)
Location:  [facility] clinic Provider:  Sha Amer L. Mariea Clonts, D.O., C.M.D.  Code Status: DNR Goals of Care:  Advanced Directives 01/02/2019  Does Patient Have a Medical Advance Directive? Yes  Type of Paramedic of [place];Living will  Does patient want to make changes to medical advance directive? No - Patient declined  Copy of [place] in Chart? Yes - validated most recent copy scanned in chart (See row information)  Would patient like information on creating a medical advance directive? -     Chief Complaint  Patient presents with  . Medical Management of Chronic Issues    6 month follow up    HPI: Patient is a 83 y.o. female seen today for medical management of chronic diseases.  She has a h/o alcoholism in remission, gastric ulcer many years ago with GI surgeries, recent issues with itching of her scalp, anxiety, hypertension and overactive bladder.    She saw Monina, NP, back on 10/8.  She had upper quadrant and midline abdominal pain.  Her hemoglobin had been really low.  Stool kit was negative.  Monina referred her to GI which I was surprised she agreed to go.  At GI, it was noted that her hgb goes up and down but she had iron deficiency and B12 deficiency.  B12 shots were suggested here and an iron infusion there.  Pt has not agreed to or done anything about either one pending my advice.  B12 was very low at 84 last month.  She is prone to diarrhea and does not eat leafy greens.  Iron pills give her indigestion.  [place] recommended at hour and a half infusion of iron.    She had chest pain when in [facility] and it turned out to be musculoskeletal and they attributed it to her neck and shoulder.  She had a stress test done that was negative.   She got short-term pain in her neck and shoulder.  She has no ongoing pain.    Neuropathy:  She went to podiatry due to itching on her feet and she thought it might be connected to itching on her head.   She began taking a very low dose of gabapentin 9/29.  She has a place on the top of her foot that is noncancerous (was biopsied).  She then saw a different podiatrist who did not detect neuropathy.  She says the symptoms on her foot were so minor.  She was hoping the itching on her foot was from the same as the itching on her scalp and that there would be a solution to both.  The second doctor also thought that the biopsy had caused the nerve sensation in her foot.  When she was at [place], she noticed and I'd mentioned for her before that her kidney function has worsened a little bit.    She's taking her myrbetriq for overactive bladder.  She loves it and it works well.  She thinks she only had an ultrasound of her bladder there not her kidneys.    Past Medical History:  Diagnosis Date  . Age-related macular degeneration, dry, both eyes   . Alcoholism ([place])   . Anemia   . Anxiety   . Cholelithiasis   . Endometriosis   . Goiter   . Herpes simplex   . Hyperlipidemia   . Hypertension   . Loss of weight   . Nontoxic multinodular goiter   . Other abnormal blood chemistry   .  PUD (peptic ulcer disease)   . Renal cyst   . Sciatica   . Scoliosis (and kyphoscoliosis), idiopathic   . Unspecified disorder of kidney and ureter   . Urinary frequency     Past Surgical History:  Procedure Laterality Date  . ABDOMINAL HYSTERECTOMY  1971  . CATARACT EXTRACTION W/ INTRAOCULAR LENS  IMPLANT, BILATERAL Bilateral 2016   Dr. Rutherford Guys  . IRRIGATION AND DEBRIDEMENT ABSCESS Left 02/23/2017   Procedure: IRRIGATION AND DEBRIDEMENT ABSCESS;  Surgeon: Mcarthur Rossetti, MD;  Location: [place];  Service: Orthopedics;  Laterality: Left;  . REPAIR OF PERFORATED ULCER  1973 &1975   Dr Vida Rigger  . STOMACH SURGERY  [phone]   removed 1/2 stomach; "had a total of 5 ORs from 1973-1980; all related to perforated ulcer"  . TONSILLECTOMY      No Known Allergies  Outpatient Encounter Medications as of  01/02/2019  Medication Sig  . ALPRAZolam (XANAX) 0.25 MG tablet TAKE 1 TABLET BY MOUTH AT BEDTIME  . BYSTOLIC 10 MG tablet TAKE 1/2 TABLET BY MOUTH ONCE DAILY  . EXFORGE 10-320 MG tablet TAKE 1 TABLET BY MOUTH EVERY DAY  . gabapentin (NEURONTIN) 100 MG capsule Take 1 capsule (100 mg total) by mouth at bedtime.  Marland Kitchen MYRBETRIQ 25 MG TB24 tablet Take 25 mg by mouth at bedtime.    No facility-administered encounter medications on file as of 01/02/2019.     Review of Systems:  Review of Systems  Constitutional: Negative for chills, fever and malaise/fatigue.       Does not really feel fatigued or weak despite hgb  HENT: Negative for congestion and sore throat.   Eyes: Negative for blurred vision.       New glasses (but macular stable and lens Rx unchanged)  Respiratory: Negative for cough and shortness of breath.   Cardiovascular: Negative for chest pain, palpitations and leg swelling.  Gastrointestinal: Negative for abdominal pain, blood in stool, constipation, diarrhea, heartburn, melena, nausea and vomiting.       No further abdominal pain  Genitourinary: Negative for dysuria.       Frequency much better on myrbetriq  Musculoskeletal: Negative for falls and joint pain.  Skin: Positive for itching. Negative for rash.  Neurological: Positive for tingling and sensory change. Negative for loss of consciousness.       On head and feet  Psychiatric/Behavioral: Negative for depression and memory loss. The patient is nervous/anxious. The patient does not have insomnia.     Health Maintenance  Topic Date Due  . FOOT EXAM  10/17/1945  . OPHTHALMOLOGY EXAM  07/17/2017  . HEMOGLOBIN A1C  06/27/2019  . MAMMOGRAM  Discontinued    Physical Exam: Vitals:   01/02/19 1430  BP: 120/70  Pulse: 78  Resp: 18  Temp: 97.9 F (36.6 C)  TempSrc: Oral  SpO2: 96%  Weight: 112 lb 3.2 oz (50.9 kg)  Height: '5\' 3"'$  (1.6 m)   Body mass index is 19.88 kg/m. Physical Exam Vitals signs reviewed.    Constitutional:      General: She is not in acute distress.    Appearance: Normal appearance. She is not ill-appearing or toxic-appearing.  HENT:     Head: Normocephalic and atraumatic.  Eyes:     Comments: New glasses  Cardiovascular:     Rate and Rhythm: Normal rate and regular rhythm.  Pulmonary:     Effort: Pulmonary effort is normal.     Breath sounds: No wheezing, rhonchi or rales.  Abdominal:  General: Bowel sounds are normal.  Musculoskeletal: Normal range of motion.     Right lower leg: No edema.     Left lower leg: No edema.  Skin:    General: Skin is warm and dry.     Coloration: Skin is pale.  Neurological:     General: No focal deficit present.     Mental Status: She is alert and oriented to person, place, and time.     Sensory: No sensory deficit.     Gait: Gait normal.  Psychiatric:        Mood and Affect: Mood normal.     Labs reviewed: Basic Metabolic Panel: Recent Labs    06/29/18 0811 11/24/18 1521 12/28/18 0802  NA 137 138 137  K 4.2 4.4 4.8  CL 106 104 107  CO2 '22 24 21  '$ GLUCOSE 109* 110 100*  BUN '25 22 21  '$ CREATININE 1.14* 1.53* 1.51*  CALCIUM 9.7 10.0 9.5   Liver Function Tests: Recent Labs    01/03/18 1348 06/29/18 0811 11/24/18 1521  AST '18 18 20  '$ ALT '10 11 9  '$ BILITOT 0.4 0.4 0.3  PROT 7.1 7.1 7.2   No results for input(s): LIPASE, AMYLASE in the last 8760 hours. No results for input(s): AMMONIA in the last 8760 hours. CBC: Recent Labs    01/03/18 1348 06/29/18 0811 11/24/18 1521 12/28/18 0802  WBC 7.3 7.1 9.3 7.0  NEUTROABS 4,548 3,309  --  3,766  HGB 10.1* 12.6 10.4* 9.4*  HCT 32.2* 39.9 33.2* 29.6*  MCV 81.9 79.5* 82.2 81.1  PLT 318 356 374 347   Lipid Panel: Recent Labs    06/29/18 0811  CHOL 220*  HDL 134  LDLCALC 69  TRIG 90  CHOLHDL 1.6   Lab Results  Component Value Date   HGBA1C 5.8 (H) 12/28/2018    Assessment/Plan 1. Other vitamin B12 deficiency anemia -she is agreeable to month  injections of 1057mg B12 with recheck after 3 months -I wonder if her neuropathic sounding itching of her scalp and the sensations on her foot might improved -had prior GI surgeries after ulcer and alcoholism so not absorbing the minimal b12 she actually gets in her diet--not able to tolerate leafy greens and not a meat eater - Vitamin B12; Future  2. Overactive bladder -doing well on myrbetriq, cont this -[facility] -we have the last note, but not the two prior ones in media  3. Stage 3b chronic kidney disease - needs further assessment now that I'm actually seeing her myself in clinic and could review the trends -will check urinalysis and renal [place] but first want to be sure she didn't already have renal [place] urology--so need first two notes - Urine Microscopic Only - CBC with Differential/Platelet; Future - Basic metabolic panel; Future  4. Recover[place] -no drink in many years -likely has also affected vitamin absorption long-term  5. Itchy scalp -persists, taking low dose gabapentin for this and it was also for her left foot, but podiatry thinks that was due to damage to nerve during biopsy?--we'll see if any of this gets better with B12 repletion  6. Macular degeneration, unspecified laterality, unspecified type -stable per patient report at her annual eye exam, monitor  Labs/tests ordered:   Lab Orders     Urine Microscopic Only     Vitamin B12     CBC with Differential/Platelet     Basic metabolic panel  Next appt:  Make b12 injection appt with [facility] asap--01/06/2019  f/u with me in 3-4 mos with labs before--05/04/2019  Sharilynn Cassity L. Libra Gatz, D.O. [facility] [address], [place] [address] Cell Phone (Mon-Fri 8am-5pm):  [phone] On Call:  [phone] & follow prompts after 5pm & weekends Office Phone:  [phone] Office Fax:  [phone]

## 2019-01-03 LAB — URINALYSIS, MICROSCOPIC ONLY
Bacteria, UA: NONE SEEN /HPF
Hyaline Cast: NONE SEEN /LPF
RBC / HPF: NONE SEEN /HPF (ref 0–2)
Squamous Epithelial / HPF: NONE SEEN /HPF (ref <=5)
WBC, UA: NONE SEEN /HPF (ref 0–5)

## 2019-01-04 NOTE — Addendum Note (Signed)
Addended by: Gayland Curry on: 01/04/2019 01:26 PM   Modules accepted: Orders

## 2019-01-06 ENCOUNTER — Ambulatory Visit (INDEPENDENT_AMBULATORY_CARE_PROVIDER_SITE_OTHER): Payer: Medicare Other

## 2019-01-06 ENCOUNTER — Other Ambulatory Visit: Payer: Self-pay

## 2019-01-06 DIAGNOSIS — D518 Other vitamin B12 deficiency anemias: Secondary | ICD-10-CM

## 2019-01-06 MED ORDER — CYANOCOBALAMIN 1000 MCG/ML IJ SOLN
1000.0000 ug | Freq: Once | INTRAMUSCULAR | Status: AC
  Administered 2019-01-06: 1000 ug via INTRAMUSCULAR

## 2019-01-19 ENCOUNTER — Encounter: Payer: Self-pay | Admitting: Internal Medicine

## 2019-01-19 ENCOUNTER — Other Ambulatory Visit: Payer: Self-pay

## 2019-01-19 ENCOUNTER — Ambulatory Visit (INDEPENDENT_AMBULATORY_CARE_PROVIDER_SITE_OTHER): Payer: Medicare Other | Admitting: Internal Medicine

## 2019-01-19 VITALS — BP 112/76 | HR 70 | Temp 97.3°F | Ht 63.0 in | Wt 112.8 lb

## 2019-01-19 DIAGNOSIS — L299 Pruritus, unspecified: Secondary | ICD-10-CM | POA: Diagnosis not present

## 2019-01-19 DIAGNOSIS — I10 Essential (primary) hypertension: Secondary | ICD-10-CM | POA: Diagnosis not present

## 2019-01-19 DIAGNOSIS — Z66 Do not resuscitate: Secondary | ICD-10-CM

## 2019-01-19 DIAGNOSIS — F1021 Alcohol dependence, in remission: Secondary | ICD-10-CM | POA: Diagnosis not present

## 2019-01-19 DIAGNOSIS — N1832 Chronic kidney disease, stage 3b: Secondary | ICD-10-CM

## 2019-01-19 MED ORDER — NEBIVOLOL HCL 10 MG PO TABS: 5.0000 mg | ORAL_TABLET | Freq: Every day | ORAL | 1 refills | Status: DC

## 2019-01-19 NOTE — Patient Instructions (Signed)
Please schedule a counseling appointment to address your itchy scalp.    Also please do schedule the renal ultrasound to evaluate your chronic kidney disease.

## 2019-01-19 NOTE — Progress Notes (Signed)
Location:  [facility] clinic Provider:  Lucia Mccreadie L. Mariea Clonts, D.O., C.M.D.  Code Status: DNR Goals of Care:  Advanced Directives 01/02/2019  Does Patient Have a Medical Advance Directive? Yes  Type of Paramedic of [place];Living will  Does patient want to make changes to medical advance directive? No - Patient declined  Copy of [place] in Chart? Yes - validated most recent copy scanned in chart (See row information)  Would patient like information on creating a medical advance directive? -   Chief Complaint  Patient presents with  . Acute Visit    Patient requesting renal u/s  . Other    Patient mention B/P today is low and according to flowsheet it is confirmed to be a low reading  . Hair/Scalp Problem    Patient with ongoing scalp issues     HPI: Patient is a 83 y.o. female seen today for medical management of chronic diseases.    We reviewed the reasoning for her renal US.  She's not had one and kidney disease is progressing.  We need to see if kidneys are simply shrunken from chronic disease or if there are cysts, a mass, some obstruction, etc.  She was agreeable after understanding this (was discussed some last time, but I think we talked about too many things and she got overwhelmed).    BP was low which was lower than usual for her.  No symptoms.  Not concerning.  Her scalp is driving her crazy.  Her dermatologist could not see anything.  Initially here one of the NPs thought she had psoriasis.  We discussed that anxiety may play a role.  She feels like it's a catch-22.  She's had a lot of therapy, but she does not have faith that doing that is going to help her scalp.    She notices improvement with energy since her b12, but not with her scalp.  She's opted to avoid the gabapentin b/c it makes her sleepy.    Past Medical History:  Diagnosis Date  . Age-related macular degeneration, dry, both eyes   . Alcoholism ([place])   . Anemia   .  Anxiety   . Cholelithiasis   . Endometriosis   . Goiter   . Herpes simplex   . Hyperlipidemia   . Hypertension   . Loss of weight   . Nontoxic multinodular goiter   . Other abnormal blood chemistry   . PUD (peptic ulcer disease)   . Renal cyst   . Sciatica   . Scoliosis (and kyphoscoliosis), idiopathic   . Unspecified disorder of kidney and ureter   . Urinary frequency     Past Surgical History:  Procedure Laterality Date  . ABDOMINAL HYSTERECTOMY  1971  . CATARACT EXTRACTION W/ INTRAOCULAR LENS  IMPLANT, BILATERAL Bilateral 2016   Dr. Rutherford Guys  . IRRIGATION AND DEBRIDEMENT ABSCESS Left 02/23/2017   Procedure: IRRIGATION AND DEBRIDEMENT ABSCESS;  Surgeon: Mcarthur Rossetti, MD;  Location: [place];  Service: Orthopedics;  Laterality: Left;  . REPAIR OF PERFORATED ULCER  1973 &1975   Dr Vida Rigger  . STOMACH SURGERY  [phone]   removed 1/2 stomach; "had a total of 5 ORs from 1973-1980; all related to perforated ulcer"  . TONSILLECTOMY      No Known Allergies  Outpatient Encounter Medications as of 01/19/2019  Medication Sig  . ALPRAZolam (XANAX) 0.25 MG tablet TAKE 1 TABLET BY MOUTH AT BEDTIME  . BYSTOLIC 10 MG tablet TAKE 1/2  TABLET BY MOUTH ONCE DAILY  . cyanocobalamin (,VITAMIN B-12,) 1000 MCG/ML injection Inject 1 mL (1,000 mcg total) into the muscle every 30 (thirty) days.  Marland Kitchen EXFORGE 10-320 MG tablet TAKE 1 TABLET BY MOUTH EVERY DAY  . gabapentin (NEURONTIN) 100 MG capsule Take 1 capsule (100 mg total) by mouth at bedtime.  Marland Kitchen MYRBETRIQ 25 MG TB24 tablet Take 25 mg by mouth at bedtime.    No facility-administered encounter medications on file as of 01/19/2019.     Review of Systems:  Review of Systems  Constitutional: Negative for chills, fever and malaise/fatigue.  Eyes:       Glasses  Respiratory: Negative for cough and shortness of breath.   Cardiovascular: Negative for chest pain, palpitations and leg swelling.  Gastrointestinal: Negative for abdominal  pain, blood in stool, constipation, diarrhea and melena.  Genitourinary: Negative for dysuria and flank pain.  Musculoskeletal: Negative for falls and joint pain.  Skin: Positive for itching. Negative for rash.  Neurological: Positive for sensory change. Negative for loss of consciousness.       Itching on right posterior side of scalp without skin findings  Endo/Heme/Allergies: Does not bruise/bleed easily.  Psychiatric/Behavioral: Negative for depression and memory loss. The patient is nervous/anxious. The patient does not have insomnia.     Health Maintenance  Topic Date Due  . FOOT EXAM  10/17/1945  . OPHTHALMOLOGY EXAM  07/17/2017  . HEMOGLOBIN A1C  06/27/2019  . MAMMOGRAM  Discontinued    Physical Exam: Vitals:   01/19/19 1148  BP: 112/76  Pulse: 70  Temp: (!) 97.3 F (36.3 C)  TempSrc: Temporal  SpO2: 99%  Weight: 112 lb 12.8 oz (51.2 kg)  Height: 5\' 3"  (1.6 m)   Body mass index is 19.98 kg/m. Physical Exam Vitals signs reviewed.  Constitutional:      General: She is not in acute distress.    Appearance: Normal appearance. She is not ill-appearing or toxic-appearing.  HENT:     Head: Normocephalic and atraumatic.  Eyes:     Comments: glasses  Cardiovascular:     Rate and Rhythm: Normal rate and regular rhythm.  Pulmonary:     Effort: Pulmonary effort is normal.     Breath sounds: Normal breath sounds.  Musculoskeletal: Normal range of motion.  Skin:    General: Skin is warm and dry.  Neurological:     General: No focal deficit present.     Mental Status: She is alert and oriented to person, place, and time.     Comments: Scrambles to make notes about everything we discuss  Psychiatric:     Comments: A bit jittery today     Labs reviewed: Basic Metabolic Panel: Recent Labs    06/29/18 0811 11/24/18 1521 12/28/18 0802  NA 137 138 137  K 4.2 4.4 4.8  CL 106 104 107  CO2 22 24 21   GLUCOSE 109* 110 100*  BUN 25 22 21   CREATININE 1.14* 1.53*  1.51*  CALCIUM 9.7 10.0 9.5   Liver Function Tests: Recent Labs    06/29/18 0811 11/24/18 1521  AST 18 20  ALT 11 9  BILITOT 0.4 0.3  PROT 7.1 7.2   No results for input(s): LIPASE, AMYLASE in the last 8760 hours. No results for input(s): AMMONIA in the last 8760 hours. CBC: Recent Labs    06/29/18 0811 11/24/18 1521 12/28/18 0802  WBC 7.1 9.3 7.0  NEUTROABS 3,309  --  3,766  HGB 12.6 10.4* 9.4*  HCT 39.9  33.2* 29.6*  MCV 79.5* 82.2 81.1  PLT 356 374 347   Lipid Panel: Recent Labs    06/29/18 0811  CHOL 220*  HDL 134  LDLCALC 69  TRIG 90  CHOLHDL 1.6   Lab Results  Component Value Date   HGBA1C 5.8 (H) 12/28/2018   Assessment/Plan 1. Stage 3b chronic kidney disease -she agrees now to pursue the renal ultrasound fortunately so we will get that done and call her with results -Avoid nephrotoxic agents like nsaids, dose adjust renally excreted meds, hydrate.  2. Essential hypertension, benign -bp well controlled--today's a little low but asymptomatic so not concerning to me--she was reassured by this b/c she got really worried at first - renewed nebivolol (BYSTOLIC) 10 MG tablet; Take 0.5 tablets (5 mg total) by mouth daily.  Dispense: 45 tablet; Refill: 1  3. Itchy scalp -right posterior aspect -persists -she does not take the gabapentin much due to sedating effect -she is now getting b12 injections monthly to get her b12 levels back up b/c they were quite low -I'm hoping for some improvement with that repletion, BUT, I did recommend she seek counseling about it b/c a certain physiologic explanation has not been found and she's tired of having it--she does have considerable anxiety, as well, and I think this may be a component as we discussed today  4. Recovering alcoholic in remission [facility]) -continues in remission for many years now  5. DNR (do not resuscitate) - DNR (Do Not Resuscitate) order renewed  Labs/tests ordered:  No new added--keep already  scheduled labs Next appt: 05/04/2019   Boe Deans L. Syrai Gladwin, D.O. [facility] [address], [place] [address] Cell Phone (Mon-Fri 8am-5pm):  [phone] On Call:  [phone] & follow prompts after 5pm & weekends Office Phone:  [phone] Office Fax:  [phone]

## 2019-01-27 ENCOUNTER — Other Ambulatory Visit: Payer: Self-pay

## 2019-01-27 ENCOUNTER — Ambulatory Visit (INDEPENDENT_AMBULATORY_CARE_PROVIDER_SITE_OTHER): Payer: Medicare Other | Admitting: Family

## 2019-01-27 ENCOUNTER — Encounter: Payer: Self-pay | Admitting: Family

## 2019-01-27 VITALS — BP 144/86 | HR 84 | Temp 97.7°F | Resp 18 | Ht 63.0 in | Wt 112.6 lb

## 2019-01-27 DIAGNOSIS — R399 Unspecified symptoms and signs involving the genitourinary system: Secondary | ICD-10-CM

## 2019-01-27 DIAGNOSIS — I10 Essential (primary) hypertension: Secondary | ICD-10-CM

## 2019-01-27 DIAGNOSIS — D518 Other vitamin B12 deficiency anemias: Secondary | ICD-10-CM | POA: Diagnosis not present

## 2019-01-27 LAB — POCT URINALYSIS DIPSTICK
Bilirubin, UA: NEGATIVE
Blood, UA: NEGATIVE
Glucose, UA: NEGATIVE
Ketones, UA: NEGATIVE
Leukocytes, UA: NEGATIVE
Nitrite, UA: NEGATIVE
Protein, UA: POSITIVE — AB
Spec Grav, UA: 1.015 (ref 1.010–1.025)
Urobilinogen, UA: 1 E.U./dL
pH, UA: 5 (ref 5.0–8.0)

## 2019-01-27 NOTE — Patient Instructions (Signed)
-   Take AZO tablet one by mouth x 2 more days.  - check blood pressure daily and record.Bring log to visit in 1 week.Notify provider if SBP > [id number]. - Resume Bystolic 5 mg tablet

## 2019-01-27 NOTE — Progress Notes (Signed)
Provider: Muhanad Torosyan FNP-C  Gayland Curry, DO  Patient Care Team: Gayland Curry, DO as PCP - General (Geriatric Medicine) Rutherford Guys, MD as Consulting Physician (Ophthalmology) Vania Rea, MD as Consulting Physician (Obstetrics and Gynecology)  Extended Emergency Contact Information Primary Emergency Contact: [place] Phone: [phone] Relation: Friend Secondary Emergency Contact: Jeanella Flattery Mobile Phone: [phone] Relation: Significant other  Code Status:  DNR Goals of care: Advanced Directive information Advanced Directives 01/02/2019  Does Patient Have a Medical Advance Directive? Yes  Type of Paramedic of [place];Living will  Does patient want to make changes to medical advance directive? No - Patient declined  Copy of [place] in Chart? Yes - validated most recent copy scanned in chart (See row information)  Would patient like information on creating a medical advance directive? -     Chief Complaint  Patient presents with  . Acute Visit    Possible UTI patient states symptoms just started yesterday with strong intense urge to void. Patient states she has drank plenty of water and took 1 cranberry pill last night. Reports mild pain in area from voiding frequently denies back or abdominal pain.   . Medication Management    patient states she stopped bystolic  a couple days ago because bystolic pressure has gotten so low, she has noticed her bystolic pressure is very low under 80 would like to discuss     HPI:  Pt is a 83 y.o. female seen today for an acute visit for evaluation of Possible UTI x 2 days. She states yesterday had strong intense urge to void with frequent urination.  she drank plenty of water and took 1 cranberry pill last night which has helped with her symptoms but not resolved she reports mild pain in area from voiding frequently. She denies any  fever,chills,nausea,vomiting ,back or abdominal pain.  Also states her diastolic blood pressure was low under 80's a couple of days ago so she stopped taking her Bystolic.she states would like to stay off.Her blood pressure today is not at goal 144/86.she was taking Bystolic 5 mg tablet daily.she denies any issues with headache,dizziness,faintness,palpitation,chest pain or shortness of breath.  Also states due for her vitamin B12 injection but vitamin B12 is out of stock at the office.Has been ordered.    Past Medical History:  Diagnosis Date  . Age-related macular degeneration, dry, both eyes   . Alcoholism ([place])   . Anemia   . Anxiety   . Cholelithiasis   . Endometriosis   . Goiter   . Herpes simplex   . Hyperlipidemia   . Hypertension   . Loss of weight   . Nontoxic multinodular goiter   . Other abnormal blood chemistry   . PUD (peptic ulcer disease)   . Renal cyst   . Sciatica   . Scoliosis (and kyphoscoliosis), idiopathic   . Unspecified disorder of kidney and ureter   . Urinary frequency    Past Surgical History:  Procedure Laterality Date  . ABDOMINAL HYSTERECTOMY  1971  . CATARACT EXTRACTION W/ INTRAOCULAR LENS  IMPLANT, BILATERAL Bilateral 2016   Dr. Rutherford Guys  . IRRIGATION AND DEBRIDEMENT ABSCESS Left 02/23/2017   Procedure: IRRIGATION AND DEBRIDEMENT ABSCESS;  Surgeon: Mcarthur Rossetti, MD;  Location: [facility];  Service: Orthopedics;  Laterality: Left;  . REPAIR OF PERFORATED ULCER  1973 &1975   Dr Vida Rigger  . STOMACH SURGERY  [phone]   removed 1/2 stomach; "  had a total of 5 ORs from 1973-1980; all related to perforated ulcer"  . TONSILLECTOMY      No Known Allergies  Outpatient Encounter Medications as of 01/27/2019  Medication Sig  . ALPRAZolam (XANAX) 0.25 MG tablet TAKE 1 TABLET BY MOUTH AT BEDTIME  . cyanocobalamin (,VITAMIN B-12,) 1000 MCG/ML injection Inject 1 mL (1,000 mcg total) into the muscle every 30 (thirty) days.  Marland Kitchen EXFORGE 10-320 MG tablet  TAKE 1 TABLET BY MOUTH EVERY DAY  . gabapentin (NEURONTIN) 100 MG capsule Take 1 capsule (100 mg total) by mouth at bedtime.  Marland Kitchen MYRBETRIQ 25 MG TB24 tablet Take 25 mg by mouth at bedtime.   . nebivolol (BYSTOLIC) 10 MG tablet Take 0.5 tablets (5 mg total) by mouth daily. (Patient not taking: Reported on 01/27/2019)   No facility-administered encounter medications on file as of 01/27/2019.    Review of Systems  Constitutional: Negative for appetite change, chills, fatigue and fever.  Respiratory: Negative for cough, chest tightness, shortness of breath and wheezing.   Cardiovascular: Negative for chest pain, palpitations and leg swelling.  Gastrointestinal: Negative for abdominal distention, abdominal pain, constipation, diarrhea, nausea and vomiting.  Genitourinary: Positive for frequency and urgency. Negative for decreased urine volume, difficulty urinating, dysuria, flank pain and hematuria.  Skin: Negative for color change, pallor and rash.  Neurological: Negative for dizziness, weakness, light-headedness, numbness and headaches.  Psychiatric/Behavioral: Negative for agitation, confusion and sleep disturbance. The patient is not nervous/anxious.     There is no immunization history on file for this patient. Pertinent  Health Maintenance Due  Topic Date Due  . FOOT EXAM  10/17/1945  . OPHTHALMOLOGY EXAM  07/17/2017  . HEMOGLOBIN A1C  06/27/2019  . MAMMOGRAM  Discontinued   Fall Risk  01/27/2019 01/02/2019 12/21/2018 11/24/2018 09/12/2018  Falls in the past year? 0 0 0 0 0  Number falls in past yr: 0 - 0 0 0  Injury with Fall? 0 0 0 - 0  Follow up - - - - -      Vitals:   01/27/19 1424  BP: (!) 144/86  Pulse: 84  Temp: 97.7 F (36.5 C)  TempSrc: Temporal  SpO2: 96%  Weight: 112 lb 9.6 oz (51.1 kg)  Height: 5\' 3"  (1.6 m)   Body mass index is 19.95 kg/m. Physical Exam Constitutional:      General: She is not in acute distress.    Appearance: She is not ill-appearing.    HENT:     Head: Normocephalic.  Eyes:     General: No scleral icterus.       Right eye: No discharge.        Left eye: No discharge.     Extraocular Movements: Extraocular movements intact.     Conjunctiva/sclera: Conjunctivae normal.     Pupils: Pupils are equal, round, and reactive to light.  Neck:     Vascular: No carotid bruit.  Cardiovascular:     Rate and Rhythm: Normal rate and regular rhythm.     Pulses: Normal pulses.     Heart sounds: Normal heart sounds. No murmur. No friction rub. No gallop.   Pulmonary:     Effort: Pulmonary effort is normal. No respiratory distress.     Breath sounds: Normal breath sounds. No wheezing, rhonchi or rales.  Chest:     Chest wall: No tenderness.  Abdominal:     General: Bowel sounds are normal. There is no distension.     Palpations: Abdomen is soft.  There is no mass.     Tenderness: There is no abdominal tenderness. There is no right CVA tenderness, left CVA tenderness, guarding or rebound.  Musculoskeletal:        General: No swelling or tenderness. Normal range of motion.     Cervical back: Normal range of motion. No rigidity or tenderness.     Right lower leg: No edema.     Left lower leg: No edema.  Lymphadenopathy:     Cervical: No cervical adenopathy.  Skin:    General: Skin is warm and dry.     Coloration: Skin is not pale.     Findings: No bruising, erythema or rash.  Neurological:     Mental Status: She is alert and oriented to person, place, and time.     Cranial Nerves: No cranial nerve deficit.     Motor: No weakness.     Gait: Gait normal.  Psychiatric:        Mood and Affect: Mood normal.        Behavior: Behavior normal.        Thought Content: Thought content normal.        Judgment: Judgment normal.    Labs reviewed: Recent Labs    06/29/18 0811 11/24/18 1521 12/28/18 0802  NA 137 138 137  K 4.2 4.4 4.8  CL 106 104 107  CO2 22 24 21   GLUCOSE 109* 110 100*  BUN 25 22 21   CREATININE 1.14* 1.53*  1.51*  CALCIUM 9.7 10.0 9.5   Recent Labs    06/29/18 0811 11/24/18 1521  AST 18 20  ALT 11 9  BILITOT 0.4 0.3  PROT 7.1 7.2   Recent Labs    06/29/18 0811 11/24/18 1521 12/28/18 0802  WBC 7.1 9.3 7.0  NEUTROABS 3,309  --  3,766  HGB 12.6 10.4* 9.4*  HCT 39.9 33.2* 29.6*  MCV 79.5* 82.2 81.1  PLT 356 374 347   Lab Results  Component Value Date   TSH 0.64 07/01/2017   Lab Results  Component Value Date   HGBA1C 5.8 (H) 12/28/2018   Lab Results  Component Value Date   CHOL 220 (H) 06/29/2018   HDL 134 06/29/2018   LDLCALC 69 06/29/2018   TRIG 90 06/29/2018   CHOLHDL 1.6 06/29/2018    Significant Diagnostic Results in last 30 days:  No results found.  Assessment/Plan  1. UTI symptoms Afebrile.Reports increased urgency and frequency to void which as improved after taking cranberry pill last night.Has also increased water intake. - POC Urinalysis Dipstick indicates orange colored urine,negative nitrites and leukocytes but positive for protein.Will send specimen culture. - Urine culture - Take AZO tablet one by mouth x 2 more days.  - Advised to continue drink 6-8 glasses of water daily.  2. Essential hypertension, benign Reports low diastolic blood pressure couple of days ago so she stopped her Bystolic 5 mg tablet daily.B/p today not at goal.Asymptomatic.Blood pressure log given this visit advised to check blood pressure daily and record.Bring log to visit in 1 week.Notify provider if SBP > [id number]. - Resume Bystolic 5 mg tablet  - low salt diet  3. Other vitamin B12 deficiency anemia Due for her monthly vit B12 injection but is out of stock at the office.Will get her injection in 1 week on her visit for follow up B/p.   Family/ staff Communication: Reviewed plan of care with patient.  Labs/tests ordered: - Urine culture Next appointment: 1 week for B/p evaluation and Vitamin  B12 injection.   Sandrea Hughs, NP

## 2019-01-29 LAB — URINE CULTURE
MICRO NUMBER:: 1191210
SPECIMEN QUALITY:: ADEQUATE

## 2019-01-30 ENCOUNTER — Other Ambulatory Visit: Payer: Medicare Other

## 2019-02-06 ENCOUNTER — Ambulatory Visit: Payer: Medicare Other

## 2019-02-06 ENCOUNTER — Ambulatory Visit (INDEPENDENT_AMBULATORY_CARE_PROVIDER_SITE_OTHER): Payer: Medicare Other | Admitting: Nurse Practitioner

## 2019-02-06 ENCOUNTER — Other Ambulatory Visit: Payer: Self-pay

## 2019-02-06 ENCOUNTER — Encounter: Payer: Self-pay | Admitting: Nurse Practitioner

## 2019-02-06 VITALS — BP 138/78 | HR 79 | Temp 97.5°F | Wt 110.8 lb

## 2019-02-06 DIAGNOSIS — I10 Essential (primary) hypertension: Secondary | ICD-10-CM | POA: Diagnosis not present

## 2019-02-06 DIAGNOSIS — Z79899 Other long term (current) drug therapy: Secondary | ICD-10-CM | POA: Diagnosis not present

## 2019-02-06 DIAGNOSIS — F419 Anxiety disorder, unspecified: Secondary | ICD-10-CM | POA: Diagnosis not present

## 2019-02-06 DIAGNOSIS — D518 Other vitamin B12 deficiency anemias: Secondary | ICD-10-CM | POA: Diagnosis not present

## 2019-02-06 MED ORDER — CYANOCOBALAMIN 1000 MCG/ML IJ SOLN
1000.0000 ug | Freq: Once | INTRAMUSCULAR | Status: AC
  Administered 2019-02-06: 1000 ug via INTRAMUSCULAR

## 2019-02-06 NOTE — Progress Notes (Signed)
Careteam: Patient Care Team: Gayland Curry, DO as PCP - General (Geriatric Medicine) Rutherford Guys, MD as Consulting Physician (Ophthalmology) Vania Rea, MD as Consulting Physician (Obstetrics and Gynecology)  Advanced Directive information    No Known Allergies  Chief Complaint  Patient presents with  . Medical Management of Chronic Issues    Follow up blood pressure. Vitamin B12 injection.     HPI: Patient is a 83 y.o. female seen in the office today for blood pressure check.  Blood pressure cuff at home tends to run her blood pressure 10 points higher than we get in office.  Home bp ranging from 136-151/63-82 Most bp are <140/80 She reports she was on whole tablet of the bystolic but "Bottom number dropped to 50"  Also felt like she was feeling sluggish on whole tablet- but then admits that may have not been medication.   She also needs to sign her non-opioid use contact due to use of xanax prescribed by Dr Mariea Clonts for her ongoing anxiety.   Review of Systems:  Review of Systems  Constitutional: Negative for chills and fever.  Respiratory: Negative for cough and shortness of breath.   Cardiovascular: Negative for chest pain and leg swelling.  Neurological: Positive for seizures. Negative for dizziness and headaches.  Psychiatric/Behavioral: The patient is nervous/anxious.    Past Medical History:  Diagnosis Date  . Age-related macular degeneration, dry, both eyes   . Alcoholism ([place])   . Anemia   . Anxiety   . Cholelithiasis   . Endometriosis   . Goiter   . Herpes simplex   . Hyperlipidemia   . Hypertension   . Loss of weight   . Nontoxic multinodular goiter   . Other abnormal blood chemistry   . PUD (peptic ulcer disease)   . Renal cyst   . Sciatica   . Scoliosis (and kyphoscoliosis), idiopathic   . Unspecified disorder of kidney and ureter   . Urinary frequency    Past Surgical History:  Procedure Laterality Date  . ABDOMINAL HYSTERECTOMY  1971    . CATARACT EXTRACTION W/ INTRAOCULAR LENS  IMPLANT, BILATERAL Bilateral 2016   Dr. Rutherford Guys  . IRRIGATION AND DEBRIDEMENT ABSCESS Left 02/23/2017   Procedure: IRRIGATION AND DEBRIDEMENT ABSCESS;  Surgeon: Mcarthur Rossetti, MD;  Location: [facility];  Service: Orthopedics;  Laterality: Left;  . REPAIR OF PERFORATED ULCER  1973 &1975   Dr Vida Rigger  . STOMACH SURGERY  [phone]   removed 1/2 stomach; "had a total of 5 ORs from 1973-1980; all related to perforated ulcer"  . TONSILLECTOMY     Social History:   reports that she quit smoking about 25 years ago. Her smoking use included cigarettes. She has a 40.00 pack-year smoking history. She has never used smokeless tobacco. She reports that she does not drink alcohol or use drugs.  Family History  Problem Relation Age of Onset  . Obesity Mother     Medications: Patient's Medications  New Prescriptions   No medications on file  Previous Medications   ALPRAZOLAM (XANAX) 0.25 MG TABLET    TAKE 1 TABLET BY MOUTH AT BEDTIME   CYANOCOBALAMIN (,VITAMIN B-12,) 1000 MCG/ML INJECTION    Inject 1 mL (1,000 mcg total) into the muscle every 30 (thirty) days.   EXFORGE 10-320 MG TABLET    TAKE 1 TABLET BY MOUTH EVERY DAY   GABAPENTIN (NEURONTIN) 100 MG CAPSULE    Take 1 capsule (100 mg total) by mouth at bedtime.  MYRBETRIQ 25 MG TB24 TABLET    Take 25 mg by mouth at bedtime.    NEBIVOLOL (BYSTOLIC) 10 MG TABLET    Take 0.5 tablets (5 mg total) by mouth daily.  Modified Medications   No medications on file  Discontinued Medications   No medications on file    Physical Exam:  Vitals:   02/06/19 1304  BP: 138/78  Pulse: 79  Temp: (!) 97.5 F (36.4 C)  TempSrc: Oral  SpO2: 98%  Weight: 110 lb 12.8 oz (50.3 kg)   Body mass index is 19.63 kg/m. Wt Readings from Last 3 Encounters:  02/06/19 110 lb 12.8 oz (50.3 kg)  01/27/19 112 lb 9.6 oz (51.1 kg)  01/19/19 112 lb 12.8 oz (51.2 kg)    Physical Exam Constitutional:      General:  She is not in acute distress.    Appearance: She is well-developed. She is not diaphoretic.  HENT:     Head: Normocephalic and atraumatic.     Mouth/Throat:     Pharynx: No oropharyngeal exudate.  Eyes:     Conjunctiva/sclera: Conjunctivae normal.     Pupils: Pupils are equal, round, and reactive to light.  Cardiovascular:     Rate and Rhythm: Normal rate and regular rhythm.     Heart sounds: Normal heart sounds.  Pulmonary:     Effort: Pulmonary effort is normal.     Breath sounds: Normal breath sounds.  Abdominal:     General: Bowel sounds are normal.     Palpations: Abdomen is soft.  Musculoskeletal:        General: No tenderness.     Cervical back: Normal range of motion and neck supple.  Skin:    General: Skin is warm and dry.  Neurological:     Mental Status: She is alert and oriented to person, place, and time.     Labs reviewed: Basic Metabolic Panel: Recent Labs    06/29/18 0811 11/24/18 1521 12/28/18 0802  NA 137 138 137  K 4.2 4.4 4.8  CL 106 104 107  CO2 22 24 21   GLUCOSE 109* 110 100*  BUN 25 22 21   CREATININE 1.14* 1.53* 1.51*  CALCIUM 9.7 10.0 9.5   Liver Function Tests: Recent Labs    06/29/18 0811 11/24/18 1521  AST 18 20  ALT 11 9  BILITOT 0.4 0.3  PROT 7.1 7.2   No results for input(s): LIPASE, AMYLASE in the last 8760 hours. No results for input(s): AMMONIA in the last 8760 hours. CBC: Recent Labs    06/29/18 0811 11/24/18 1521 12/28/18 0802  WBC 7.1 9.3 7.0  NEUTROABS 3,309  --  3,766  HGB 12.6 10.4* 9.4*  HCT 39.9 33.2* 29.6*  MCV 79.5* 82.2 81.1  PLT 356 374 347   Lipid Panel: Recent Labs    06/29/18 0811  CHOL 220*  HDL 134  LDLCALC 69  TRIG 90  CHOLHDL 1.6   TSH: No results for input(s): TSH in the last 8760 hours. A1C: Lab Results  Component Value Date   HGBA1C 5.8 (H) 12/28/2018     Assessment/Plan 1. Other vitamin B12 deficiency anemia - cyanocobalamin ((VITAMIN B-12)) injection 1,000 mcg -to  schedule 1 month injection  2. Anxiety -continues on xanax daily at bedtime as needed for anxiety, takes routinely -updated prescribing contract - UDS  3. Hypertension Overall blood pressure controlled at <140/90 on home readings will occasional elevated. She will continue to monitor and notify if becomes elevated. DASH diet  given.  Next appt: 03/13/2019 as scheduled.  Carlos American. [place], [facility] [phone]

## 2019-02-06 NOTE — Patient Instructions (Addendum)
To schedule b12 injection in 1 month  To continue to monitor blood pressure, goal is for blood pressure to be LESS THAN 140/90  DASH Eating Plan DASH stands for "Dietary Approaches to Stop Hypertension." The DASH eating plan is a healthy eating plan that has been shown to reduce high blood pressure (hypertension). It may also reduce your risk for type 2 diabetes, heart disease, and stroke. The DASH eating plan may also help with weight loss. What are tips for following this plan?  General guidelines  Avoid eating more than 2,300 mg (milligrams) of salt (sodium) a day. If you have hypertension, you may need to reduce your sodium intake to 1,500 mg a day.  Limit alcohol intake to no more than 1 drink a day for nonpregnant women and 2 drinks a day for men. One drink equals 12 oz of beer, 5 oz of wine, or 1 oz of hard liquor.  Work with your health care provider to maintain a healthy body weight or to lose weight. Ask what an ideal weight is for you.  Get at least 30 minutes of exercise that causes your heart to beat faster (aerobic exercise) most days of the week. Activities may include walking, swimming, or biking.  Work with your health care provider or diet and nutrition specialist (dietitian) to adjust your eating plan to your individual calorie needs. Reading food labels   Check food labels for the amount of sodium per serving. Choose foods with less than 5 percent of the Daily Value of sodium. Generally, foods with less than 300 mg of sodium per serving fit into this eating plan.  To find whole grains, look for the word "whole" as the first word in the ingredient list. Shopping  Buy products labeled as "low-sodium" or "no salt added."  Buy fresh foods. Avoid canned foods and premade or frozen meals. Cooking  Avoid adding salt when cooking. Use salt-free seasonings or herbs instead of table salt or sea salt. Check with your health care provider or pharmacist before using salt  substitutes.  Do not fry foods. Cook foods using healthy methods such as baking, boiling, grilling, and broiling instead.  Cook with heart-healthy oils, such as olive, canola, soybean, or sunflower oil. Meal planning  Eat a balanced diet that includes: ? 5 or more servings of fruits and vegetables each day. At each meal, try to fill half of your plate with fruits and vegetables. ? Up to 6-8 servings of whole grains each day. ? Less than 6 oz of lean meat, poultry, or fish each day. A 3-oz serving of meat is about the same size as a deck of cards. One egg equals 1 oz. ? 2 servings of low-fat dairy each day. ? A serving of nuts, seeds, or beans 5 times each week. ? Heart-healthy fats. Healthy fats called Omega-3 fatty acids are found in foods such as flaxseeds and coldwater fish, like sardines, salmon, and mackerel.  Limit how much you eat of the following: ? Canned or prepackaged foods. ? Food that is high in trans fat, such as fried foods. ? Food that is high in saturated fat, such as fatty meat. ? Sweets, desserts, sugary drinks, and other foods with added sugar. ? Full-fat dairy products.  Do not salt foods before eating.  Try to eat at least 2 vegetarian meals each week.  Eat more home-cooked food and less restaurant, buffet, and fast food.  When eating at a restaurant, ask that your food be prepared with  less salt or no salt, if possible. What foods are recommended? The items listed may not be a complete list. Talk with your dietitian about what dietary choices are best for you. Grains Whole-grain or whole-wheat bread. Whole-grain or whole-wheat pasta. Brown rice. [place]. Bulgur. Whole-grain and low-sodium cereals. Pita bread. Low-fat, low-sodium crackers. Whole-wheat flour tortillas. Vegetables Fresh or frozen vegetables (raw, steamed, roasted, or grilled). Low-sodium or reduced-sodium tomato and vegetable juice. Low-sodium or reduced-sodium tomato sauce and tomato  paste. Low-sodium or reduced-sodium canned vegetables. Fruits All fresh, dried, or frozen fruit. Canned fruit in natural juice (without added sugar). Meat and other protein foods Skinless chicken or [place]. Ground chicken or [place]. Pork with fat trimmed off. Fish and seafood. Egg whites. Dried beans, peas, or lentils. Unsalted nuts, nut butters, and seeds. Unsalted canned beans. Lean cuts of beef with fat trimmed off. Low-sodium, lean deli meat. Dairy Low-fat (1%) or fat-free (skim) milk. Fat-free, low-fat, or reduced-fat cheeses. Nonfat, low-sodium ricotta or cottage cheese. Low-fat or nonfat yogurt. Low-fat, low-sodium cheese. Fats and oils Soft margarine without trans fats. Vegetable oil. Low-fat, reduced-fat, or light mayonnaise and salad dressings (reduced-sodium). Canola, safflower, olive, soybean, and sunflower oils. Avocado. Seasoning and other foods Herbs. Spices. Seasoning mixes without salt. Unsalted popcorn and pretzels. Fat-free sweets. What foods are not recommended? The items listed may not be a complete list. Talk with your dietitian about what dietary choices are best for you. Grains Baked goods made with fat, such as croissants, muffins, or some breads. Dry pasta or rice meal packs. Vegetables Creamed or fried vegetables. Vegetables in a cheese sauce. Regular canned vegetables (not low-sodium or reduced-sodium). Regular canned tomato sauce and paste (not low-sodium or reduced-sodium). Regular tomato and vegetable juice (not low-sodium or reduced-sodium). Angie Fava. Olives. Fruits Canned fruit in a light or heavy syrup. Fried fruit. Fruit in cream or butter sauce. Meat and other protein foods Fatty cuts of meat. Ribs. Fried meat. Berniece Salines. Sausage. Bologna and other processed lunch meats. Salami. Fatback. Hotdogs. Bratwurst. Salted nuts and seeds. Canned beans with added salt. Canned or smoked fish. Whole eggs or egg yolks. Chicken or [place] with skin. Dairy Whole or 2% milk,  cream, and half-and-half. Whole or full-fat cream cheese. Whole-fat or sweetened yogurt. Full-fat cheese. Nondairy creamers. Whipped toppings. Processed cheese and cheese spreads. Fats and oils Butter. Stick margarine. Lard. Shortening. Ghee. Bacon fat. Tropical oils, such as coconut, palm kernel, or palm oil. Seasoning and other foods Salted popcorn and pretzels. Onion salt, garlic salt, seasoned salt, table salt, and sea salt. Worcestershire sauce. Tartar sauce. Barbecue sauce. Teriyaki sauce. Soy sauce, including reduced-sodium. Steak sauce. Canned and packaged gravies. Fish sauce. Oyster sauce. Cocktail sauce. Horseradish that you find on the shelf. Ketchup. Mustard. Meat flavorings and tenderizers. Bouillon cubes. Hot sauce and Tabasco sauce. Premade or packaged marinades. Premade or packaged taco seasonings. Relishes. Regular salad dressings. Where to find more information:  National Heart, Lung, and [place]: https://wilson-eaton.com/  American Heart Association: www.heart.org Summary  The DASH eating plan is a healthy eating plan that has been shown to reduce high blood pressure (hypertension). It may also reduce your risk for type 2 diabetes, heart disease, and stroke.  With the DASH eating plan, you should limit salt (sodium) intake to 2,300 mg a day. If you have hypertension, you may need to reduce your sodium intake to 1,500 mg a day.  When on the DASH eating plan, aim to eat more fresh fruits and vegetables, whole grains, lean proteins,  low-fat dairy, and heart-healthy fats.  Work with your health care provider or diet and nutrition specialist (dietitian) to adjust your eating plan to your individual calorie needs. This information is not intended to replace advice given to you by your health care provider. Make sure you discuss any questions you have with your health care provider. Document Released: 01/22/2011 Document Revised: 01/15/2017 Document Reviewed: 01/27/2016 Elsevier  Patient Education  2020 [employer].

## 2019-02-07 ENCOUNTER — Ambulatory Visit
Admission: RE | Admit: 2019-02-07 | Discharge: 2019-02-07 | Disposition: A | Discharge status: Home / Self Care | Payer: Medicare Other | Source: Ambulatory Visit | Attending: Internal Medicine | Admitting: Internal Medicine

## 2019-02-07 DIAGNOSIS — N1832 Chronic kidney disease, stage 3b: Secondary | ICD-10-CM | POA: Diagnosis not present

## 2019-02-07 LAB — PAIN MGMT, PROFILE 1 W/O CONF, U
Amphetamines: NEGATIVE ng/mL
Barbiturates: NEGATIVE ng/mL
Benzodiazepines: POSITIVE ng/mL
Cocaine Metabolite: NEGATIVE ng/mL
Creatinine: 126.1 mg/dL
Marijuana Metabolite: NEGATIVE ng/mL
Methadone Metabolite: NEGATIVE ng/mL
Opiates: NEGATIVE ng/mL
Oxidant: NEGATIVE ug/mL
Oxycodone: NEGATIVE ng/mL
Phencyclidine: NEGATIVE ng/mL
pH: 5.4 (ref 4.5–9.0)

## 2019-02-08 NOTE — Progress Notes (Signed)
Please call patient and give her all of this information:   Kidneys are small which is consistent with chronic kidney disease but no worrisome findings like blockages in the outflow from the kidneys, masses or cysts seen.  This is good news.  There was an incidental finding of little spots in her spleen--they are not cancerous.  The radiologist interprets them to be from a distant infection probably in childhood or young adulthood that may have caused "granulomas" (small areas of inflammation).  This often happened many years ago from TB or similar infections that we rarely see anymore.  Nothing needs to be done about these and there is no reason to worry about them.  They were found by accident when we did the test looking at the kidneys.

## 2019-03-02 ENCOUNTER — Other Ambulatory Visit: Payer: Self-pay | Admitting: Internal Medicine

## 2019-03-02 DIAGNOSIS — F329 Major depressive disorder, single episode, unspecified: Secondary | ICD-10-CM

## 2019-03-02 DIAGNOSIS — F32A Depression, unspecified: Secondary | ICD-10-CM

## 2019-03-11 ENCOUNTER — Other Ambulatory Visit: Payer: Self-pay | Admitting: Podiatry

## 2019-03-13 ENCOUNTER — Ambulatory Visit: Payer: Medicare Other

## 2019-03-19 ENCOUNTER — Ambulatory Visit: Payer: Medicare Other

## 2019-03-23 ENCOUNTER — Ambulatory Visit (INDEPENDENT_AMBULATORY_CARE_PROVIDER_SITE_OTHER): Payer: Medicare Other

## 2019-03-23 ENCOUNTER — Other Ambulatory Visit: Payer: Self-pay

## 2019-03-23 DIAGNOSIS — D518 Other vitamin B12 deficiency anemias: Secondary | ICD-10-CM | POA: Diagnosis not present

## 2019-03-23 MED ORDER — CYANOCOBALAMIN 1000 MCG/ML IJ SOLN
1000.0000 ug | Freq: Once | INTRAMUSCULAR | Status: AC
  Administered 2019-03-23: 1000 ug via INTRAMUSCULAR

## 2019-03-24 ENCOUNTER — Ambulatory Visit: Payer: Medicare Other

## 2019-03-26 ENCOUNTER — Ambulatory Visit: Payer: Self-pay

## 2019-03-30 ENCOUNTER — Other Ambulatory Visit: Payer: Medicare Other

## 2019-03-30 ENCOUNTER — Other Ambulatory Visit: Payer: Self-pay | Admitting: Internal Medicine

## 2019-03-30 ENCOUNTER — Other Ambulatory Visit: Payer: Self-pay

## 2019-03-30 DIAGNOSIS — D518 Other vitamin B12 deficiency anemias: Secondary | ICD-10-CM | POA: Diagnosis not present

## 2019-03-30 DIAGNOSIS — F419 Anxiety disorder, unspecified: Secondary | ICD-10-CM

## 2019-03-30 DIAGNOSIS — N1832 Chronic kidney disease, stage 3b: Secondary | ICD-10-CM | POA: Diagnosis not present

## 2019-03-30 DIAGNOSIS — F329 Major depressive disorder, single episode, unspecified: Secondary | ICD-10-CM

## 2019-03-31 LAB — CBC WITH DIFFERENTIAL/PLATELET
Absolute Monocytes: 601 cells/uL (ref 200–950)
Basophils Absolute: 73 cells/uL (ref 0–200)
Basophils Relative: 1.1 %
Eosinophils Absolute: 198 cells/uL (ref 15–500)
Eosinophils Relative: 3 %
HCT: 32.4 % — ABNORMAL LOW (ref 35.0–45.0)
Hemoglobin: 10.1 g/dL — ABNORMAL LOW (ref 11.7–15.5)
Lymphs Abs: 2231 cells/uL (ref 850–3900)
MCH: 25 pg — ABNORMAL LOW (ref 27.0–33.0)
MCHC: 31.2 g/dL — ABNORMAL LOW (ref 32.0–36.0)
MCV: 80.2 fL (ref 80.0–100.0)
MPV: 10.5 fL (ref 7.5–12.5)
Monocytes Relative: 9.1 %
Neutro Abs: 3498 cells/uL (ref 1500–7800)
Neutrophils Relative %: 53 %
Platelets: 363 10*3/uL (ref 140–400)
RBC: 4.04 10*6/uL (ref 3.80–5.10)
RDW: 15.2 % — ABNORMAL HIGH (ref 11.0–15.0)
Total Lymphocyte: 33.8 %
WBC: 6.6 10*3/uL (ref 3.8–10.8)

## 2019-03-31 LAB — VITAMIN B12: Vitamin B-12: 472 pg/mL (ref 200–1100)

## 2019-03-31 LAB — BASIC METABOLIC PANEL
BUN/Creatinine Ratio: 18 (calc) (ref 6–22)
BUN: 25 mg/dL (ref 7–25)
CO2: 21 mmol/L (ref 20–32)
Calcium: 9.2 mg/dL (ref 8.6–10.4)
Chloride: 109 mmol/L (ref 98–110)
Creat: 1.41 mg/dL — ABNORMAL HIGH (ref 0.60–0.88)
Glucose, Bld: 100 mg/dL — ABNORMAL HIGH (ref 65–99)
Potassium: 4 mmol/L (ref 3.5–5.3)
Sodium: 138 mmol/L (ref 135–146)

## 2019-03-31 NOTE — Progress Notes (Signed)
All labs have improved some since last check: Anemia is a bit better.  Kidney function a bit better and B12 is now in the 400s.  I'd like it up over 500.  We can review at her appt.

## 2019-04-17 ENCOUNTER — Ambulatory Visit (INDEPENDENT_AMBULATORY_CARE_PROVIDER_SITE_OTHER): Payer: Medicare Other | Admitting: Internal Medicine

## 2019-04-17 ENCOUNTER — Encounter: Payer: Self-pay | Admitting: Internal Medicine

## 2019-04-17 ENCOUNTER — Other Ambulatory Visit: Payer: Self-pay

## 2019-04-17 VITALS — BP 138/82 | HR 72 | Temp 97.5°F | Ht 63.0 in | Wt 110.5 lb

## 2019-04-17 DIAGNOSIS — L299 Pruritus, unspecified: Secondary | ICD-10-CM | POA: Diagnosis not present

## 2019-04-17 MED ORDER — DULOXETINE HCL 20 MG PO CPEP: 20.0000 mg | ORAL_CAPSULE | Freq: Every day | ORAL | 3 refills | Status: DC

## 2019-04-17 NOTE — Progress Notes (Signed)
Location:  [facility] clinic Provider: Darl Kuss L. Mariea Clonts, D.O., C.M.D.  Code Status: DNR Goals of Care:  Advanced Directives 04/17/2019  Does Patient Have a Medical Advance Directive? Yes  Type of Advance Directive Out of facility DNR (pink MOST or yellow form)  Does patient want to make changes to medical advance directive? No - Patient declined  Copy of [place] in Chart? -  Would patient like information on creating a medical advance directive? -     Chief Complaint  Patient presents with  . Acute Visit    Rash on head     HPI: Patient is a 84 y.o. female seen today for an acute visit for "recurrence of her rash on her head" which was not visible previously to me nor her dermatologist nor the NP here.  She is very uncomfortable and does not know what to do.  This is more intense.  For a while she could not feel anything.  Now she feels something over head.  Feels like grains of stuff.  She has cleansed her scalp, put oil on it, used dandruff shampoo.  Wearing a hat was helping keep her from itching.  It responds to an ice pack--it cools it off.   Getting B12 injections has not helped it.  She is taking the gabapentin but she does not feel like it's helping.  It does help her sleep.  She is not sure if the xanax helps either except that she can rest.  She feels patches of graininess in her hair.  Nothing is visible.  She cannot scrape it off with her fingernails.  Sometimes feels raw, sometimes like mosquito bites.  Says it's tight and adhered to her skin, not flaking up and going everywhere like dandruff.  Life feels good.  She does not feel depressed or anxious.  She has been doing a lot of relaxation tapes and [employer]  She's done visualization, affirmations.  She was using cherry benadryl for kids at one point at night.    We discussed how I previously thought this was neurologic and I suggested using more gabapentin.  She did not tolerate cymbalta 30mg .    Has  second covid vaccine scheduled a week from today.    Past Medical History:  Diagnosis Date  . Age-related macular degeneration, dry, both eyes   . Alcoholism ([place])   . Anemia   . Anxiety   . Cholelithiasis   . Endometriosis   . Goiter   . Herpes simplex   . Hyperlipidemia   . Hypertension   . Loss of weight   . Nontoxic multinodular goiter   . Other abnormal blood chemistry   . PUD (peptic ulcer disease)   . Renal cyst   . Sciatica   . Scoliosis (and kyphoscoliosis), idiopathic   . Unspecified disorder of kidney and ureter   . Urinary frequency     Past Surgical History:  Procedure Laterality Date  . ABDOMINAL HYSTERECTOMY  1971  . CATARACT EXTRACTION W/ INTRAOCULAR LENS  IMPLANT, BILATERAL Bilateral 2016   Dr. Rutherford Guys  . IRRIGATION AND DEBRIDEMENT ABSCESS Left 02/23/2017   Procedure: IRRIGATION AND DEBRIDEMENT ABSCESS;  Surgeon: Mcarthur Rossetti, MD;  Location: [place];  Service: Orthopedics;  Laterality: Left;  . REPAIR OF PERFORATED ULCER  1973 &1975   Dr Vida Rigger  . STOMACH SURGERY  [phone]   removed 1/2 stomach; "had a total of 5 ORs from 1973-1980; all related to perforated ulcer"  .  TONSILLECTOMY      No Known Allergies  Outpatient Encounter Medications as of 04/17/2019  Medication Sig  . ALPRAZolam (XANAX) 0.25 MG tablet TAKE 1 TABLET BY MOUTH AT BEDTIME  . cyanocobalamin (,VITAMIN B-12,) 1000 MCG/ML injection Inject 1 mL (1,000 mcg total) into the muscle every 30 (thirty) days.  Marland Kitchen EXFORGE 10-320 MG tablet TAKE 1 TABLET BY MOUTH EVERY DAY  . gabapentin (NEURONTIN) 100 MG capsule TAKE 1 CAPSULE(100 MG) BY MOUTH AT BEDTIME  . MYRBETRIQ 25 MG TB24 tablet Take 25 mg by mouth at bedtime.   . nebivolol (BYSTOLIC) 10 MG tablet Take 0.5 tablets (5 mg total) by mouth daily.   No facility-administered encounter medications on file as of 04/17/2019.    Review of Systems:  Review of Systems  Constitutional: Negative for chills, fever and malaise/fatigue.    Cardiovascular: Negative for chest pain and palpitations.  Neurological: Positive for tingling and sensory change. Negative for tremors.  Psychiatric/Behavioral: Negative for depression and memory loss. The patient is nervous/anxious. The patient does not have insomnia (if itching/pain of scalp).     Health Maintenance  Topic Date Due  . FOOT EXAM  10/17/1945  . OPHTHALMOLOGY EXAM  07/17/2017  . HEMOGLOBIN A1C  06/27/2019  . MAMMOGRAM  Discontinued    Physical Exam: Vitals:   04/17/19 1306  Height: 5\' 3"  (1.6 m)   Body mass index is 19.63 kg/m. Physical Exam Vitals reviewed.  Constitutional:      General: She is not in acute distress.    Appearance: Normal appearance. She is not ill-appearing.  Pulmonary:     Effort: Pulmonary effort is normal.  Skin:    Comments: Scalp appears normal--no raised areas, no scaliness, hair appears healthy  Neurological:     General: No focal deficit present.     Mental Status: She is alert and oriented to person, place, and time.  Psychiatric:     Comments: Near tears about her scalp     Labs reviewed: Basic Metabolic Panel: Recent Labs    11/24/18 1521 12/28/18 0802 03/30/19 0000  NA 138 137 138  K 4.4 4.8 4.0  CL 104 107 109  CO2 24 21 21   GLUCOSE 110 100* 100*  BUN 22 21 25   CREATININE 1.53* 1.51* 1.41*  CALCIUM 10.0 9.5 9.2   Liver Function Tests: Recent Labs    06/29/18 0811 11/24/18 1521  AST 18 20  ALT 11 9  BILITOT 0.4 0.3  PROT 7.1 7.2   No results for input(s): LIPASE, AMYLASE in the last 8760 hours. No results for input(s): AMMONIA in the last 8760 hours. CBC: Recent Labs    06/29/18 0811 06/29/18 0811 11/24/18 1521 12/28/18 0802 03/30/19 0000  WBC 7.1   < > 9.3 7.0 6.6  NEUTROABS 3,309  --   --  3,766 3,498  HGB 12.6   < > 10.4* 9.4* 10.1*  HCT 39.9   < > 33.2* 29.6* 32.4*  MCV 79.5*   < > 82.2 81.1 80.2  PLT 356   < > 374 347 363   < > = values in this interval not displayed.   Lipid  Panel: Recent Labs    06/29/18 0811  CHOL 220*  HDL 134  LDLCALC 69  TRIG 90  CHOLHDL 1.6   Lab Results  Component Value Date   HGBA1C 5.8 (H) 12/28/2018   Assessment/Plan 1. Itchy scalp - had been tried on several shampoos, tried gabapentin at higher dose and could not tolerate,  tried cymbalta at higher dose and did not tolerate, dermatology, salon, myself, none can see anything -she says she's not anxious or depressed at present - Ambulatory referral to Dermatology--requests new referral to different dermatologist  - DULoxetine (CYMBALTA) 20 MG capsule; Take 1 capsule (20 mg total) by mouth daily.  Dispense: 30 capsule; Refill: 3--we'll try her on lower dose cymbalta -keep 3/18 appt though it may take 4-6 wks to max effect of cymbalta  Labs/tests ordered:   Orders Placed This Encounter  Procedures  . Ambulatory referral to Dermatology    Referral Priority:   Routine    Referral Type:   Consultation    Referral Reason:   Specialty Services Required    Requested Specialty:   Dermatology    Number of Visits Requested:   1    Next appt:  05/04/2019  Alex Mcmanigal L. Shaheim Mahar, D.O. [facility] [address], [place] [address] Cell Phone (Mon-Fri 8am-5pm):  [phone] On Call:  [phone] & follow prompts after 5pm & weekends Office Phone:  [phone] Office Fax:  [phone]

## 2019-04-17 NOTE — Patient Instructions (Signed)
Neuropathic Pain Neuropathic pain is pain caused by damage to the nerves that are responsible for certain sensations in your body (sensory nerves). The pain can be caused by:  Damage to the sensory nerves that send signals to your spinal cord and brain (peripheral nervous system).  Damage to the sensory nerves in your brain or spinal cord (central nervous system). Neuropathic pain can make you more sensitive to pain. Even a minor sensation can feel very painful. This is usually a long-term condition that can be difficult to treat. The type of pain differs from person to person. It may:  Start suddenly (acute), or it may develop slowly and last for a long time (chronic).  Come and go as damaged nerves heal, or it may stay at the same level for years.  Cause emotional distress, loss of sleep, and a lower quality of life. What are the causes? The most common cause of this condition is diabetes. Many other diseases and conditions can also cause neuropathic pain. Causes of neuropathic pain can be classified as:  Toxic. This is caused by medicines and chemicals. The most common cause of toxic neuropathic pain is damage from cancer treatments (chemotherapy).  Metabolic. This can be caused by: ? Diabetes. This is the most common disease that damages the nerves. ? Lack of vitamin B from long-term alcohol abuse.  Traumatic. Any injury that cuts, crushes, or stretches a nerve can cause damage and pain. A common example is feeling pain after losing an arm or leg (phantom limb pain).  Compression-related. If a sensory nerve gets trapped or compressed for a long period of time, the blood supply to the nerve can be cut off.  Vascular. Many blood vessel diseases can cause neuropathic pain by decreasing blood supply and oxygen to nerves.  Autoimmune. This type of pain results from diseases in which the body's defense system (immune system) mistakenly attacks sensory nerves. Examples of autoimmune diseases  that can cause neuropathic pain include lupus and multiple sclerosis.  Infectious. Many types of viral infections can damage sensory nerves and cause pain. Shingles infection is a common cause of this type of pain.  Inherited. Neuropathic pain can be a symptom of many diseases that are passed down through families (genetic). What increases the risk? You are more likely to develop this condition if:  You have diabetes.  You smoke.  You drink too much alcohol.  You are taking certain medicines, including medicines that kill cancer cells (chemotherapy) or that treat immune system disorders. What are the signs or symptoms? The main symptom is pain. Neuropathic pain is often described as:  Burning.  Shock-like.  Stinging.  Hot or cold.  Itching. How is this diagnosed? No single test can diagnose neuropathic pain. It is diagnosed based on:  Physical exam and your symptoms. Your health care provider will ask you about your pain. You may be asked to use a pain scale to describe how bad your pain is.  Tests. These may be done to see if you have a high sensitivity to pain and to help find the cause and location of any sensory nerve damage. They include: ? Nerve conduction studies to test how well nerve signals travel through your sensory nerves (electrodiagnostic testing). ? Stimulating your sensory nerves through electrodes on your skin and measuring the response in your spinal cord and brain (somatosensory evoked potential).  Imaging studies, such as: ? X-rays. ? CT scan. ? MRI. How is this treated? Treatment for neuropathic pain may change   over time. You may need to try different treatment options or a combination of treatments. Some options include:  Treating the underlying cause of the neuropathy, such as diabetes, kidney disease, or vitamin deficiencies.  Stopping medicines that can cause neuropathy, such as chemotherapy.  Medicine to relieve pain. Medicines may  include: ? Prescription or over-the-counter pain medicine. ? Anti-seizure medicine. ? Antidepressant medicines. ? Pain-relieving patches that are applied to painful areas of skin. ? A medicine to numb the area (local anesthetic), which can be injected as a nerve block.  Transcutaneous nerve stimulation. This uses electrical currents to block painful nerve signals. The treatment is painless.  Alternative treatments, such as: ? Acupuncture. ? Meditation. ? Massage. ? Physical therapy. ? Pain management programs. ? Counseling. Follow these instructions at home: Medicines   Take over-the-counter and prescription medicines only as told by your health care provider.  Do not drive or use heavy machinery while taking prescription pain medicine.  If you are taking prescription pain medicine, take actions to prevent or treat constipation. Your health care provider may recommend that you: ? Drink enough fluid to keep your urine pale yellow. ? Eat foods that are high in fiber, such as fresh fruits and vegetables, whole grains, and beans. ? Limit foods that are high in fat and processed sugars, such as fried or sweet foods. ? Take an over-the-counter or prescription medicine for constipation. Lifestyle   Have a good support system at home.  Consider joining a chronic pain support group.  Do not use any products that contain nicotine or tobacco, such as cigarettes and e-cigarettes. If you need help quitting, ask your health care provider.  Do not drink alcohol. General instructions  Learn as much as you can about your condition.  Work closely with all your health care providers to find the treatment plan that works best for you.  Ask your health care provider what activities are safe for you.  Keep all follow-up visits as told by your health care provider. This is important. Contact a health care provider if:  Your pain treatments are not working.  You are having side effects  from your medicines.  You are struggling with tiredness (fatigue), mood changes, depression, or anxiety. Summary  Neuropathic pain is pain caused by damage to the nerves that are responsible for certain sensations in your body (sensory nerves).  Neuropathic pain may come and go as damaged nerves heal, or it may stay at the same level for years.  Neuropathic pain is usually a long-term condition that can be difficult to treat. Consider joining a chronic pain support group. This information is not intended to replace advice given to you by your health care provider. Make sure you discuss any questions you have with your health care provider. Document Revised: 05/26/2018 Document Reviewed: 02/19/2017 Elsevier Patient Education  2020 [employer]  

## 2019-04-19 ENCOUNTER — Telehealth: Payer: Self-pay | Admitting: *Deleted

## 2019-04-19 NOTE — Telephone Encounter (Signed)
Pt calling stating that she can not take Cymbalta 20mg , per pt it upset her stomach and gave her diarrhea. I removed form med list and listed on allergy list

## 2019-04-19 NOTE — Telephone Encounter (Signed)
Apparently, she took just one pill.  She had a different reaction to the 30mg  previously.

## 2019-04-25 ENCOUNTER — Ambulatory Visit: Payer: Medicare Other

## 2019-05-03 ENCOUNTER — Other Ambulatory Visit: Payer: Self-pay

## 2019-05-03 ENCOUNTER — Encounter: Payer: Self-pay | Admitting: Dermatology

## 2019-05-03 ENCOUNTER — Ambulatory Visit (INDEPENDENT_AMBULATORY_CARE_PROVIDER_SITE_OTHER): Payer: Medicare Other | Admitting: Dermatology

## 2019-05-03 DIAGNOSIS — R208 Other disturbances of skin sensation: Secondary | ICD-10-CM | POA: Diagnosis not present

## 2019-05-03 DIAGNOSIS — R202 Paresthesia of skin: Secondary | ICD-10-CM | POA: Diagnosis not present

## 2019-05-03 NOTE — Progress Notes (Addendum)
   Follow-Up Visit   Subjective  [name] is a 84 y.o. female who presents for the following: Follow-up (Patient here today for scalp sensations x 1 year. Keeps patient up at night sometimes).  Scalp burning Location: scalp  Duration: one year Quality: not improved Associated Signs/Symptoms: Modifying Factors:  Severity:  Timing: Context:   The following portions of the chart were reviewed this encounter and updated as appropriate:     Objective  Well appearing patient in no apparent distress; mood and affect are within normal limits.  A focused examination was performed including head and neck. Relevant physical exam findings are noted in the Assessment and Plan.   Assessment & Plan  Dysesthesia Mid Occipital Scalp  Detailed discussion once again about the lack of an FDA approved options and the potential benefit of slowly incremental dosing of gabapentin.  Alternatively she may allow referral to the [facility].  Paresthesia of skin  Patient returns with no abatement in her daily scalp discomfort.  I completely agree with her assessment that there are no visible inflammatory changes.  This is not eczema or psoriasis.  The persistence of discomfort in the absence of visible inflammation fits the diagnosis of cutaneous dysesthesia.  The scalp is one of the most common areas that this occurs.  There is no FDA approved therapy for this but neuroactive medicines used for diabetic neuropathy and after shingles nerve pain are frequently helpful.  We discussed the gabapentin which she was given by her primary doctor but was reluctant to take because of the risk of feeling drowsy, which is very understandable.  If [name] wants to try this medication, she understands that we will slowly incrementally increase from a pediatric dose to keep it tolerable.  She will only take the medicine before going to sleep so there is less risk of feeling drowsy or disoriented during the day.  She can  increase the 100 mg pill by 1 pill weekly if tolerated if she tolerates 300mg  at nighttime we will switch from the pediatric 100 mg to the 300mg , an adult dose.  We can do these changes by phone.  There is no routine monitoring required.  I will send a copy of this note to her primary care doctor.  Although the safe dose of this medication is as much as 3600 mg daily I typically incrementally only increased to a quarter of that, 900 mg at nighttime.  Should this fail, she may wish to do a one-time consultation with Dr. Vallery Ridge at [facility] dermatology.  Addendum [name] has enough medication at home to take to pediatric 100 mg tablets at nighttime for approximately 10days I will have her try this dose and then she is to call me and if she tolerates the low-dose without significant improvement we will phone in her E prescribe 1 adulterant milligrams daily we will forward a copy of this note to her primary care doctorDysesthesia Mid Occipital Scalp  Detailed discussion once again about the lack of an FDA approved options and the potential benefit of slowly incremental dosing of gabapentin.  Alternatively she may allow referral to the [facility].  Paresthesia of skin

## 2019-05-04 ENCOUNTER — Encounter: Payer: Self-pay | Admitting: Internal Medicine

## 2019-05-04 ENCOUNTER — Ambulatory Visit (INDEPENDENT_AMBULATORY_CARE_PROVIDER_SITE_OTHER): Payer: Medicare Other | Admitting: Internal Medicine

## 2019-05-04 VITALS — BP 128/72 | HR 72 | Temp 97.1°F | Ht 63.0 in | Wt 111.0 lb

## 2019-05-04 DIAGNOSIS — D508 Other iron deficiency anemias: Secondary | ICD-10-CM

## 2019-05-04 DIAGNOSIS — R739 Hyperglycemia, unspecified: Secondary | ICD-10-CM | POA: Diagnosis not present

## 2019-05-04 DIAGNOSIS — G629 Polyneuropathy, unspecified: Secondary | ICD-10-CM

## 2019-05-04 DIAGNOSIS — D518 Other vitamin B12 deficiency anemias: Secondary | ICD-10-CM | POA: Diagnosis not present

## 2019-05-04 MED ORDER — CYANOCOBALAMIN 1000 MCG/ML IJ SOLN
1000.0000 ug | Freq: Once | INTRAMUSCULAR | Status: AC
  Administered 2019-05-04: 16:00:00 1000 ug via INTRAMUSCULAR

## 2019-05-04 NOTE — Patient Instructions (Signed)
Contact [facility] about your shingles vaccines.

## 2019-05-04 NOTE — Progress Notes (Signed)
Location:  [facility] clinic Provider:  Laveyah Oriol L. Mariea Clonts, D.O., C.M.D.  Code Status: DNR Goals of Care:  Advanced Directives 05/04/2019  Does Patient Have a Medical Advance Directive? Yes  Type of Advance Directive Out of facility DNR (pink MOST or yellow form)  Does patient want to make changes to medical advance directive? No - Patient declined  Copy of [place] in Chart? -  Would patient like information on creating a medical advance directive? -  Pre-existing out of facility DNR order (yellow form or pink MOST form) Yellow form placed in chart (order not valid for inpatient use)   Chief Complaint  Patient presents with  . Medical Management of Chronic Issues    4 month follow up, lab results and B12 injection    HPI: Patient is a 84 y.o. female seen today for medical management of chronic diseases.    Dr. Denna Haggard confirmed she should use the gabapentin therapy.  She has been taking the 100mg  dosage and then is going to 2 of them and let him know and then he will give her a script for 300mg .  She feels different taking it, but not necessarily bad.  She feels pretty good today.  She now says she does believe her malnutrition impacted her neuropathy (we had discussed before).    She'd had her anemia and Jessica with GI had suggested an infusion.  She did start her b12 therapy and now is going to go ahead and accept the iron infusion.    She is now accepting her diagnosis after Dr. Denna Haggard told her with more certainly apparently than the rest of [place] have.    She had a crown replaced Monday which will help her chewing.  She's had both covid vaccines.  Past Medical History:  Diagnosis Date  . Age-related macular degeneration, dry, both eyes   . Alcoholism ([place])   . Anemia   . Anxiety   . Cholelithiasis   . Endometriosis   . Goiter   . Herpes simplex   . Hyperlipidemia   . Hypertension   . Loss of weight   . Nontoxic multinodular goiter   . Other abnormal  blood chemistry   . PUD (peptic ulcer disease)   . Renal cyst   . Sciatica   . Scoliosis (and kyphoscoliosis), idiopathic   . Unspecified disorder of kidney and ureter   . Urinary frequency     Past Surgical History:  Procedure Laterality Date  . ABDOMINAL HYSTERECTOMY  1971  . CATARACT EXTRACTION W/ INTRAOCULAR LENS  IMPLANT, BILATERAL Bilateral 2016   Dr. Rutherford Guys  . IRRIGATION AND DEBRIDEMENT ABSCESS Left 02/23/2017   Procedure: IRRIGATION AND DEBRIDEMENT ABSCESS;  Surgeon: Mcarthur Rossetti, MD;  Location: [facility];  Service: Orthopedics;  Laterality: Left;  . REPAIR OF PERFORATED ULCER  1973 &1975   Dr Vida Rigger  . STOMACH SURGERY  [phone]   removed 1/2 stomach; "had a total of 5 ORs from 1973-1980; all related to perforated ulcer"  . TONSILLECTOMY      Allergies  Allergen Reactions  . Cymbalta [Duloxetine Hcl] Diarrhea    Outpatient Encounter Medications as of 05/04/2019  Medication Sig  . ALPRAZolam (XANAX) 0.25 MG tablet TAKE 1 TABLET BY MOUTH AT BEDTIME  . cyanocobalamin (,VITAMIN B-12,) 1000 MCG/ML injection Inject 1 mL (1,000 mcg total) into the muscle every 30 (thirty) days.  Marland Kitchen EXFORGE 10-320 MG tablet TAKE 1 TABLET BY MOUTH EVERY DAY  . gabapentin (NEURONTIN) 100 MG  capsule TAKE 1 CAPSULE(100 MG) BY MOUTH AT BEDTIME  . MYRBETRIQ 25 MG TB24 tablet Take 25 mg by mouth at bedtime.   . nebivolol (BYSTOLIC) 10 MG tablet Take 0.5 tablets (5 mg total) by mouth daily.   No facility-administered encounter medications on file as of 05/04/2019.    Review of Systems:  Review of Systems  Constitutional: Negative for chills, fever and malaise/fatigue.  HENT: Negative for congestion.   Eyes: Negative for blurred vision.  Respiratory: Negative for shortness of breath.   Cardiovascular: Negative for chest pain, palpitations and leg swelling.  Gastrointestinal: Negative for abdominal pain.  Genitourinary: Negative for dysuria.  Musculoskeletal: Negative for falls and  joint pain.  Skin: Positive for itching. Negative for rash.  Neurological: Positive for tingling and sensory change. Negative for dizziness and loss of consciousness.  Psychiatric/Behavioral: Negative for depression. The patient is nervous/anxious.     Health Maintenance  Topic Date Due  . FOOT EXAM  Never done  . OPHTHALMOLOGY EXAM  07/17/2017  . HEMOGLOBIN A1C  06/27/2019  . MAMMOGRAM  Discontinued    Physical Exam: Vitals:   05/04/19 1516  BP: 128/72  Pulse: 72  Temp: (!) 97.1 F (36.2 C)  SpO2: 98%  Weight: 111 lb (50.3 kg)  Height: 5\' 3"  (1.6 m)   Body mass index is 19.66 kg/m. Physical Exam Vitals reviewed.  Constitutional:      Appearance: Normal appearance.  HENT:     Head: Normocephalic and atraumatic.     Comments: Wearing cap and baseball cap on top  Cardiovascular:     Rate and Rhythm: Normal rate and regular rhythm.  Pulmonary:     Effort: Pulmonary effort is normal.     Breath sounds: Normal breath sounds.  Abdominal:     General: Bowel sounds are normal.  Musculoskeletal:        General: Normal range of motion.     Right lower leg: No edema.     Left lower leg: No edema.  Skin:    General: Skin is warm and dry.  Neurological:     General: No focal deficit present.     Mental Status: She is alert and oriented to person, place, and time.  Psychiatric:        Mood and Affect: Mood normal.        Behavior: Behavior normal.     Comments: Less anxious today     Labs reviewed: Basic Metabolic Panel: Recent Labs    11/24/18 1521 12/28/18 0802 03/30/19 0000  NA 138 137 138  K 4.4 4.8 4.0  CL 104 107 109  CO2 24 21 21   GLUCOSE 110 100* 100*  BUN 22 21 25   CREATININE 1.53* 1.51* 1.41*  CALCIUM 10.0 9.5 9.2   Liver Function Tests: Recent Labs    06/29/18 0811 11/24/18 1521  AST 18 20  ALT 11 9  BILITOT 0.4 0.3  PROT 7.1 7.2   No results for input(s): LIPASE, AMYLASE in the last 8760 hours. No results for input(s): AMMONIA in the  last 8760 hours. CBC: Recent Labs    06/29/18 0811 06/29/18 0811 11/24/18 1521 12/28/18 0802 03/30/19 0000  WBC 7.1   < > 9.3 7.0 6.6  NEUTROABS 3,309  --   --  3,766 3,498  HGB 12.6   < > 10.4* 9.4* 10.1*  HCT 39.9   < > 33.2* 29.6* 32.4*  MCV 79.5*   < > 82.2 81.1 80.2  PLT 356   < >  374 347 363   < > = values in this interval not displayed.   Lipid Panel: Recent Labs    06/29/18 0811  CHOL 220*  HDL 134  LDLCALC 69  TRIG 90  CHOLHDL 1.6   Lab Results  Component Value Date   HGBA1C 5.8 (H) 12/28/2018    Assessment/Plan 1. Other vitamin B12 deficiency anemia - continue supplementation--injection given today - Vitamin B12; Future check before next visit - cyanocobalamin ((VITAMIN B-12)) injection 1,000 mcg  2. Neuropathy -continue b12, gabapentin as per Dr. Denna Haggard (did not continue as directed when I prescribed)  3. Iron deficiency anemia secondary to inadequate dietary iron intake -plans to get iron infusion now with GI so iron panel can be followed up by them  4. Hyperglycemia - f/u lab before next visit - Hemoglobin A1c; Future  Labs/tests ordered:   Lab Orders     Vitamin B12     Hemoglobin A1c  Next appt:  09/07/2019  Jair Lindblad L. Verlon Carcione, D.O. [facility] [address], [place] [address] Cell Phone (Mon-Fri 8am-5pm):  [phone] On Call:  [phone] & follow prompts after 5pm & weekends Office Phone:  [phone] Office Fax:  [phone]

## 2019-05-05 ENCOUNTER — Telehealth: Payer: Self-pay | Admitting: *Deleted

## 2019-05-05 NOTE — Telephone Encounter (Signed)
Pt had a visit yesterday and the iron infusion was discussed and pt would like to proceed with this, are we ordering this or GI? Please advise  3. Iron deficiency anemia secondary to inadequate dietary iron intake -plans to get iron infusion now with GI so iron panel can be followed up by them

## 2019-05-05 NOTE — Telephone Encounter (Signed)
Patient is calling to schedule infusion

## 2019-05-05 NOTE — Telephone Encounter (Signed)
GI previously offered it and she told me she was going to got back to them for it.

## 2019-05-05 NOTE — Telephone Encounter (Signed)
Spoke with patient and she was told by GI that Dr. Mariea Clonts needs to set this up.

## 2019-05-05 NOTE — Telephone Encounter (Signed)
The pt has been advised that she was only advised to have 1 IV iron infusion and does not need another.

## 2019-05-05 NOTE — Telephone Encounter (Signed)
.  left message to have patient return my call.  

## 2019-05-05 NOTE — Telephone Encounter (Signed)
Let's call the GI office and find out.  It looked to me like she never got the single iron infusion that was recommended there.

## 2019-05-08 NOTE — Telephone Encounter (Signed)
Patient is now wanting to do the iron infusion, pt is very confused. Should we order?

## 2019-05-08 NOTE — Telephone Encounter (Signed)
Looks like pt was supposed to have IV Iron in October but it dose not look like she got it. Do you want [place] to reschedule pt for Iron or would you like her to have labs to see if she still needs it now. Please advise.

## 2019-05-08 NOTE — Telephone Encounter (Signed)
See phone note 12/16/18, I have sent a message to GI for clarification.

## 2019-05-09 ENCOUNTER — Other Ambulatory Visit: Payer: Self-pay

## 2019-05-09 DIAGNOSIS — D649 Anemia, unspecified: Secondary | ICD-10-CM

## 2019-05-09 NOTE — Telephone Encounter (Signed)
Yes, please have her come in for CBC, ferritin, and IBC panel first.  Thank you,  Jess

## 2019-05-09 NOTE — Telephone Encounter (Signed)
Lab orders in epic. Left message for pt to call back.

## 2019-05-10 NOTE — Telephone Encounter (Signed)
Spoke with pt and she is aware and will come for labs.

## 2019-05-10 NOTE — Telephone Encounter (Signed)
Patient called back and I instructed her to call GI, per pt she will and thank you.

## 2019-05-10 NOTE — Telephone Encounter (Signed)
Left message on VM asking pt to contact GI, they want to do labs and possibly schedule her Iron Infusion, looks like GI is trying to reach patient.

## 2019-05-11 ENCOUNTER — Other Ambulatory Visit (INDEPENDENT_AMBULATORY_CARE_PROVIDER_SITE_OTHER): Payer: Medicare Other

## 2019-05-11 DIAGNOSIS — D649 Anemia, unspecified: Secondary | ICD-10-CM | POA: Diagnosis not present

## 2019-05-11 LAB — CBC WITH DIFFERENTIAL/PLATELET
Basophils Absolute: 0.1 10*3/uL (ref 0.0–0.1)
Basophils Relative: 1.6 % (ref 0.0–3.0)
Eosinophils Absolute: 0.1 10*3/uL (ref 0.0–0.7)
Eosinophils Relative: 1.7 % (ref 0.0–5.0)
HCT: 30.5 % — ABNORMAL LOW (ref 36.0–46.0)
Hemoglobin: 9.6 g/dL — ABNORMAL LOW (ref 12.0–15.0)
Lymphocytes Relative: 30.1 % (ref 12.0–46.0)
Lymphs Abs: 2.1 10*3/uL (ref 0.7–4.0)
MCHC: 31.5 g/dL (ref 30.0–36.0)
MCV: 79.1 fl (ref 78.0–100.0)
Monocytes Absolute: 0.6 10*3/uL (ref 0.1–1.0)
Monocytes Relative: 9.2 % (ref 3.0–12.0)
Neutro Abs: 4 10*3/uL (ref 1.4–7.7)
Neutrophils Relative %: 57.4 % (ref 43.0–77.0)
Platelets: 342 10*3/uL (ref 150.0–400.0)
RBC: 3.86 Mil/uL — ABNORMAL LOW (ref 3.87–5.11)
RDW: 16.4 % — ABNORMAL HIGH (ref 11.5–15.5)
WBC: 6.9 10*3/uL (ref 4.0–10.5)

## 2019-05-11 LAB — IBC + FERRITIN
Ferritin: 6.2 ng/mL — ABNORMAL LOW (ref 10.0–291.0)
Iron: 28 ug/dL — ABNORMAL LOW (ref 42–145)
Saturation Ratios: 5.2 % — ABNORMAL LOW (ref 20.0–50.0)
Transferrin: 384 mg/dL — ABNORMAL HIGH (ref 212.0–360.0)

## 2019-05-14 ENCOUNTER — Other Ambulatory Visit: Payer: Self-pay | Admitting: Internal Medicine

## 2019-05-14 DIAGNOSIS — I10 Essential (primary) hypertension: Secondary | ICD-10-CM

## 2019-05-23 ENCOUNTER — Other Ambulatory Visit: Payer: Self-pay

## 2019-05-23 ENCOUNTER — Encounter: Payer: Self-pay | Admitting: Internal Medicine

## 2019-05-23 ENCOUNTER — Telehealth: Payer: Self-pay | Admitting: Dermatology

## 2019-05-23 MED ORDER — GABAPENTIN 100 MG PO CAPS: 100.0000 mg | ORAL_CAPSULE | Freq: Every day | ORAL | 3 refills | Status: DC

## 2019-05-23 NOTE — Telephone Encounter (Signed)
Patient left message stating that she was told to call when it was time for Gabapentin 300 mg. prescription to be called into her pharmacy--[facility] [phone]. Chart [phone]

## 2019-05-23 NOTE — Telephone Encounter (Signed)
Patient returned call (message on voice mail) about prescription being called in.

## 2019-05-25 ENCOUNTER — Telehealth: Payer: Self-pay | Admitting: Dermatology

## 2019-05-25 MED ORDER — GABAPENTIN 300 MG PO CAPS: 300.0000 mg | ORAL_CAPSULE | Freq: Every day | ORAL | 2 refills | Status: DC

## 2019-05-25 NOTE — Telephone Encounter (Signed)
Patient called needed refill prescription for gabapentin 300mg  not 100mg .  I called [facility] and cancelled gabapentin 100mg  and okay per Dr Denna Haggard to send over new prescription for gabapentin 300mg  #30 (2rf).  Patient aware.

## 2019-05-25 NOTE — Telephone Encounter (Signed)
Rx for Gabapintin was supposed to be 300 mg, per pt, but was called in for 100 mg to [facility]

## 2019-06-06 ENCOUNTER — Other Ambulatory Visit: Payer: Self-pay

## 2019-06-06 ENCOUNTER — Ambulatory Visit (INDEPENDENT_AMBULATORY_CARE_PROVIDER_SITE_OTHER): Payer: Medicare Other

## 2019-06-06 DIAGNOSIS — D518 Other vitamin B12 deficiency anemias: Secondary | ICD-10-CM | POA: Diagnosis not present

## 2019-06-06 MED ORDER — CYANOCOBALAMIN 1000 MCG/ML IJ SOLN
1000.0000 ug | Freq: Once | INTRAMUSCULAR | Status: AC
  Administered 2019-06-06: 14:00:00 1000 ug via INTRAMUSCULAR

## 2019-06-17 IMAGING — CT CT SHOULDER*L* W/O CM
3 series · 11 of 33 positions shown, 13 images · non-contrast
Comparison: Radiographs of the left shoulder from earlier on the
same day.

CLINICAL DATA: 81-year-old female with left shoulder pain. No known
injury.

EXAM:
CT OF THE UPPER LEFT EXTREMITY WITHOUT CONTRAST
TECHNIQUE: Multidetector CT imaging of the upper left extremity was performed
according to the standard protocol.

[Series 7: ax st · axial · 0.22mm/px · z∈[-110,-23]mm · 3 of 73 slices shown, 4 images]
[im 17/73  soft-tissue]
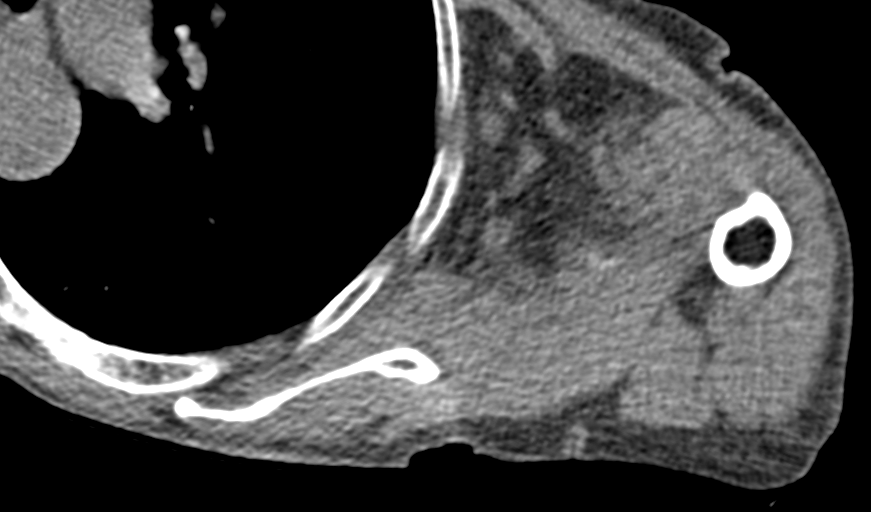
[im 17/73  bone]
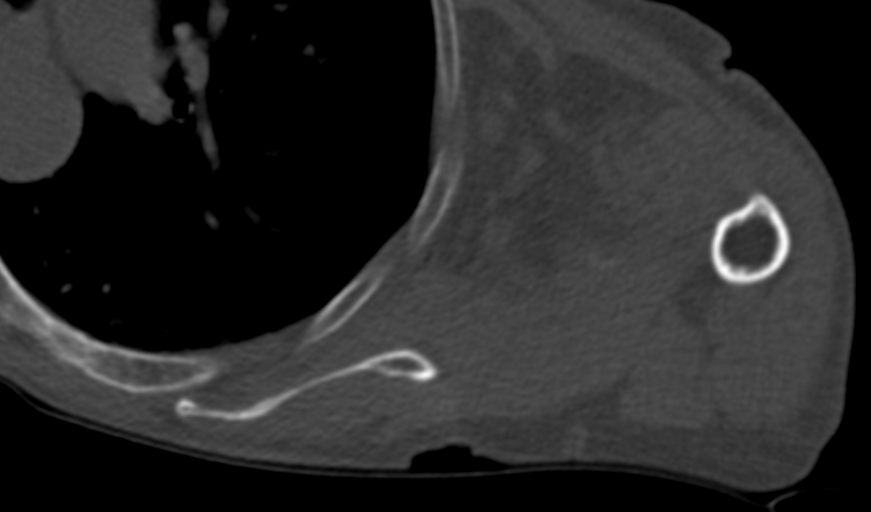
[im 39/73  bone]
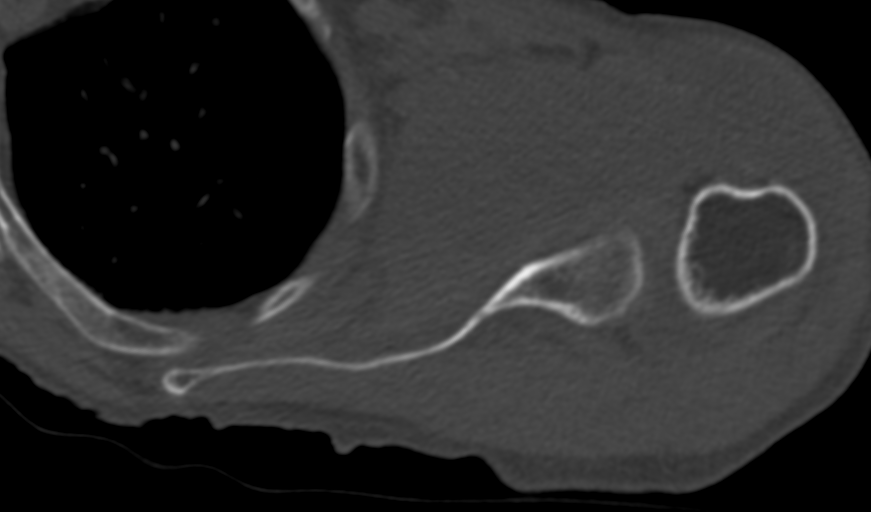
[im 61/73  bone]
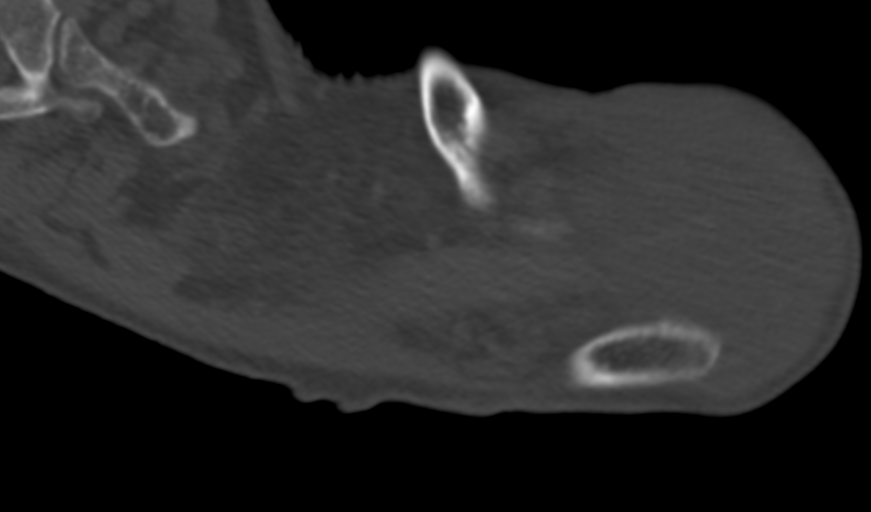

[Series 8: cor st · coronal · 0.29mm/px · 3 of 48 slices shown]
[im 10/48  bone]
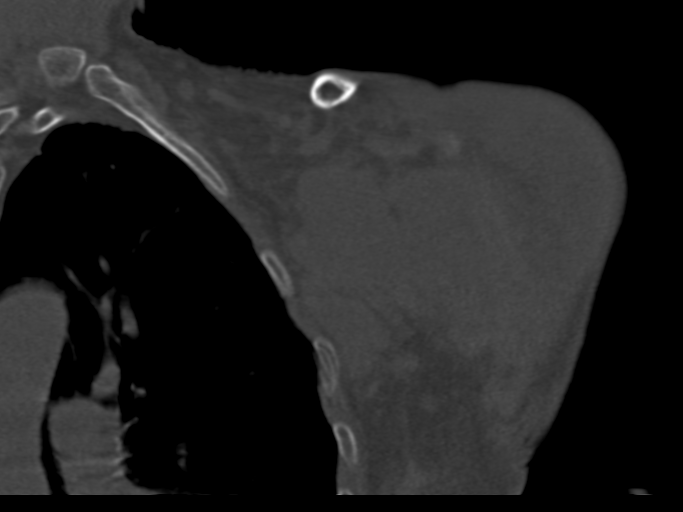
[im 19/48  bone]
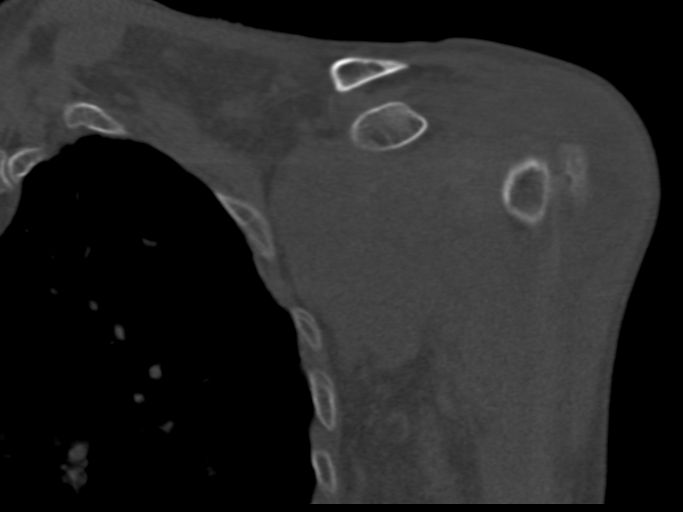
[im 29/48  bone]
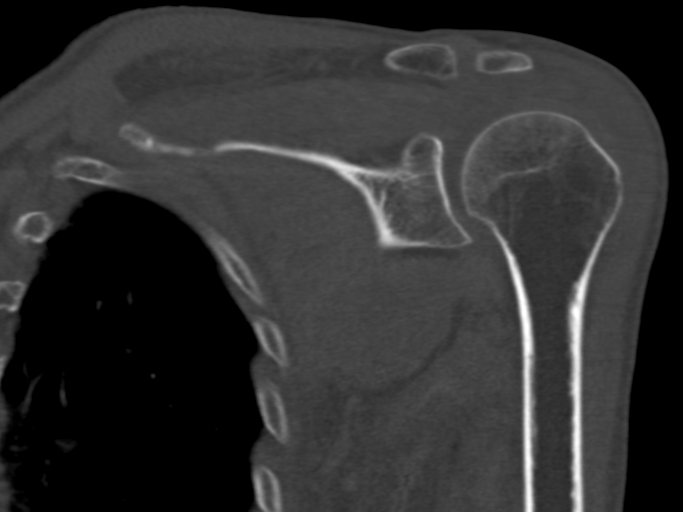

[Series 9: sag st · sagittal · 0.22mm/px · 5 of 79 slices shown, 6 images]
[im 27/79  bone]
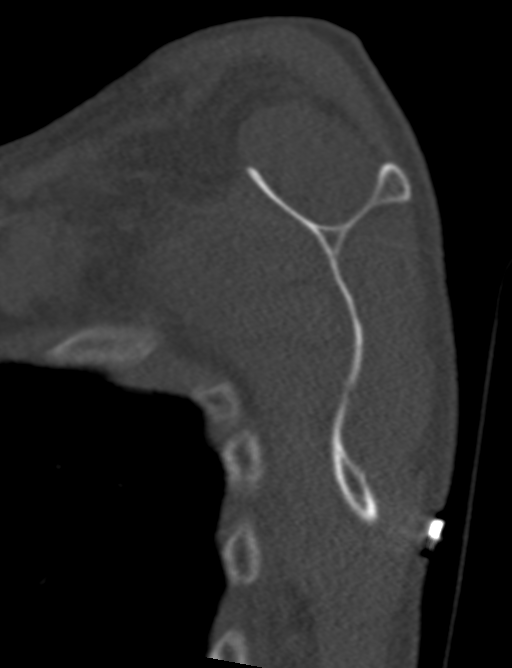
[im 33/79  bone]
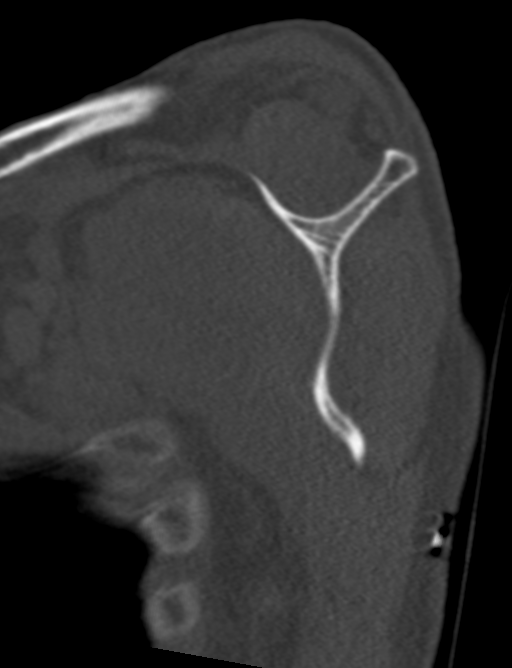
[im 40/79  soft-tissue]
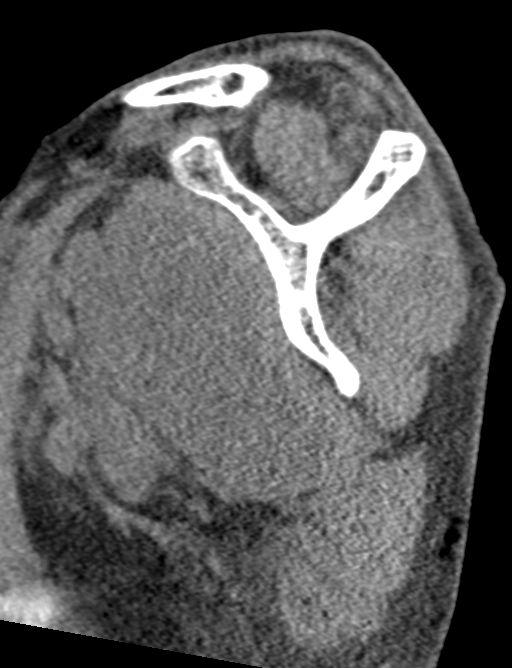
[im 40/79  bone]
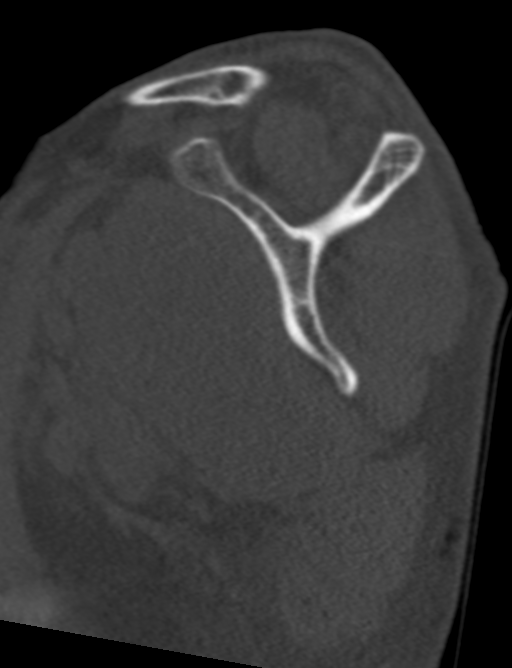
[im 46/79  bone]
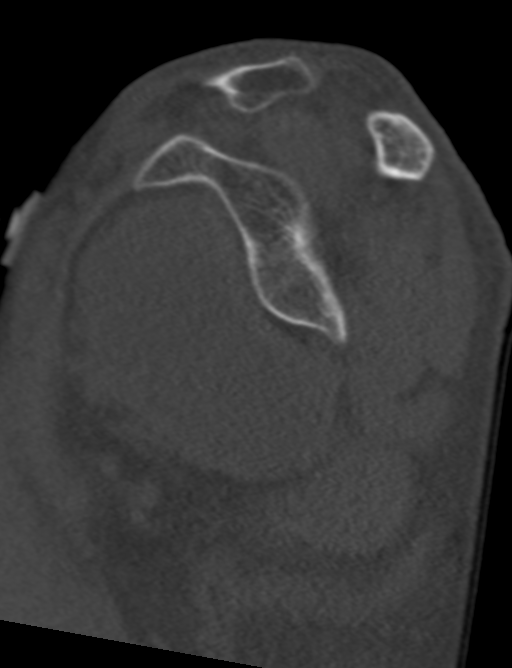
[im 53/79  bone]
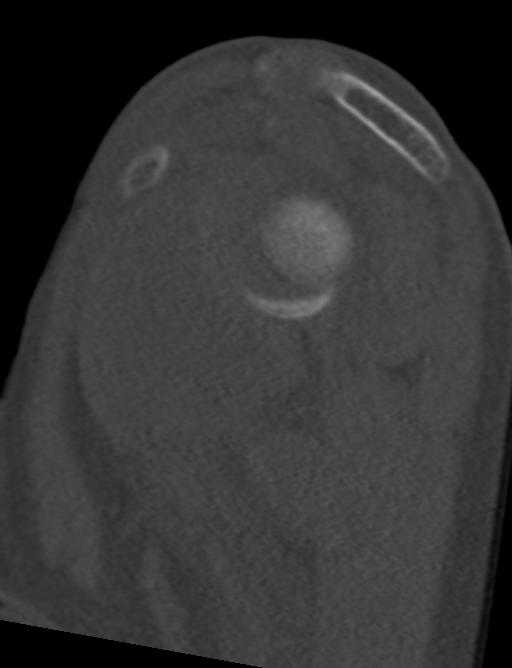

[11 of 33 positions shown; findings below may reference images not displayed]

FINDINGS: Bones/Joint/Cartilage

No acute fracture is identified of the left shoulder. Slight dorsal
subluxation of the humeral head relative to the glenoid likely from
mass effect from adjacent presumed large periarticular/ bursal fluid
collection. No suspicious osseous lesion is seen.

Ligaments

Suboptimally assessed by CT.

Muscles and Tendons

Overlying the subscapularis muscle is a masslike hypodensity
suspicious for tracking of what may represent a large subacromial
and subdeltoid bursal fluid collection. This measures approximately
7.7 x 3.9 x 6.6 cm on series 7, image 35 and coronal image 23. This
finding may represent stigmata of a large bursitis. Intramuscular
mass cannot be entirely excluded given limitations of a noncontrast
study. Crescentic subtle hypodensity deep to the deltoid muscle is
also noted on the coronal oblique and sagittal views causing soft
tissue prominence and swelling. There is also likely reflects
stigmata of a large sub deltoid and subacromial bursal fluid
collection. Non emergent MRI without and with contrast would help
further correlation and to exclude intrinsic masses or adenopathy
involving or adjacent to the subscapularis.
IMPRESSION: 1. Lack of intravenous contrast limits assessment. Subtle
hypodensity overlies the subscapularis tendon believed to represent
continuation of a large subacromial and subdeltoid bursal fluid
collection likely from enlarged bursitis. This measures
approximately 7.7 x 3.9 x 6.6 cm. Alternatively, subacute
intramuscular hematoma without active hemorrhage might have a
similar appearance appear An eccentric intramuscular mass of the
subscapularis, or potentially adjacent adenopathy is not entirely
excluded but believed less likely. Subtle low attenuation favors
fluid. Findings may represent stigmata of a large subacromial and
subdeltoid bursitis. Suggest non emergent MRI for better assessment
however.
2. Soft tissue thickening likely related to tracking of fluid within
the subdeltoid bursa as well.
3. No acute osseous abnormality of the left shoulder. No suspicious
osseous lesions.

## 2019-06-21 NOTE — Progress Notes (Signed)
Referral sounds good to me.  Thank you!

## 2019-07-10 ENCOUNTER — Other Ambulatory Visit: Payer: Self-pay

## 2019-07-10 ENCOUNTER — Ambulatory Visit (INDEPENDENT_AMBULATORY_CARE_PROVIDER_SITE_OTHER): Payer: Medicare Other

## 2019-07-10 DIAGNOSIS — D518 Other vitamin B12 deficiency anemias: Secondary | ICD-10-CM

## 2019-07-10 MED ORDER — CYANOCOBALAMIN 1000 MCG/ML IJ SOLN
1000.0000 ug | Freq: Once | INTRAMUSCULAR | Status: AC
  Administered 2019-07-10: 1000 ug via INTRAMUSCULAR

## 2019-07-10 MED ORDER — CYANOCOBALAMIN 1000 MCG/ML IJ SOLN: 1000.0000 ug | Freq: Once | INTRAMUSCULAR | Status: DC

## 2019-07-27 ENCOUNTER — Other Ambulatory Visit: Payer: Self-pay | Admitting: Internal Medicine

## 2019-07-27 DIAGNOSIS — I10 Essential (primary) hypertension: Secondary | ICD-10-CM

## 2019-08-18 ENCOUNTER — Other Ambulatory Visit: Payer: Self-pay | Admitting: Dermatology

## 2019-08-22 ENCOUNTER — Other Ambulatory Visit: Payer: Self-pay | Admitting: Dermatology

## 2019-09-07 ENCOUNTER — Other Ambulatory Visit: Payer: Self-pay

## 2019-09-07 ENCOUNTER — Encounter: Payer: Medicare Other | Admitting: Internal Medicine

## 2019-09-07 ENCOUNTER — Encounter: Payer: Self-pay | Admitting: Internal Medicine

## 2019-09-08 NOTE — Progress Notes (Signed)
This encounter was created in error - please disregard.

## 2019-09-14 ENCOUNTER — Encounter: Payer: Self-pay | Admitting: Internal Medicine

## 2019-09-14 ENCOUNTER — Other Ambulatory Visit: Payer: Self-pay

## 2019-09-14 ENCOUNTER — Ambulatory Visit (INDEPENDENT_AMBULATORY_CARE_PROVIDER_SITE_OTHER): Payer: Medicare Other | Admitting: Internal Medicine

## 2019-09-14 VITALS — BP 122/82 | HR 85 | Temp 97.8°F | Wt 111.0 lb

## 2019-09-14 DIAGNOSIS — R739 Hyperglycemia, unspecified: Secondary | ICD-10-CM

## 2019-09-14 DIAGNOSIS — L299 Pruritus, unspecified: Secondary | ICD-10-CM | POA: Diagnosis not present

## 2019-09-14 DIAGNOSIS — D518 Other vitamin B12 deficiency anemias: Secondary | ICD-10-CM | POA: Diagnosis not present

## 2019-09-14 DIAGNOSIS — N1832 Chronic kidney disease, stage 3b: Secondary | ICD-10-CM | POA: Diagnosis not present

## 2019-09-14 DIAGNOSIS — D508 Other iron deficiency anemias: Secondary | ICD-10-CM | POA: Diagnosis not present

## 2019-09-14 DIAGNOSIS — G629 Polyneuropathy, unspecified: Secondary | ICD-10-CM

## 2019-09-14 NOTE — Progress Notes (Signed)
Location:  [facility] clinic Provider:  Lielle Vandervort L. Mariea Clonts, D.O., C.M.D.  Code Status: DNR Goals of Care:  Advanced Directives 09/14/2019  Does Patient Have a Medical Advance Directive? Yes  Type of Advance Directive Out of facility DNR (pink MOST or yellow form)  Does patient want to make changes to medical advance directive? No - Patient declined  Copy of [place] in Chart? -  Would patient like information on creating a medical advance directive? -  Pre-existing out of facility DNR order (yellow form or pink MOST form) -     Chief Complaint  Patient presents with  . Medical Management of Chronic Issues    4 month follow     HPI: Patient is a 84 y.o. female seen today for medical management of chronic diseases.    She continues to say she's gotten no direction of what to do about her scalp problem.  She says she cannot distract herself from it anymore.  Says keeping it cool helps some of the time.  She keeps a netting over her head to keep her from touching it.  She's seen derm multiple times.  We've tried her on gabapentin 300mg  at bedtime which Dr. Denna Haggard agreed with also.  There is no rash and has not been.  She says it makes her dizzy.  She continues to say no one explained anything to her.  We spent a prolonged time talking about this on several occasions and how it seemed to be neuropathic.  She says it's starting to feel pressure.  Sometimes feels like a bug biting, other times like it's moving around.  Some days better than others.    She's had some counseling and she does not think that is the problem and she's "gone as far as she can with that".  B12 and iron supplementation have been recommended and some done and she has opted not to continue due to feeling no better. Past Medical History:  Diagnosis Date  . Age-related macular degeneration, dry, both eyes   . Alcoholism ([place])   . Anemia   . Anxiety   . Cholelithiasis   . Endometriosis   . Goiter   .  Herpes simplex   . Hyperlipidemia   . Hypertension   . Loss of weight   . Nontoxic multinodular goiter   . Other abnormal blood chemistry   . PUD (peptic ulcer disease)   . Renal cyst   . Sciatica   . Scoliosis (and kyphoscoliosis), idiopathic   . Unspecified disorder of kidney and ureter   . Urinary frequency     Past Surgical History:  Procedure Laterality Date  . ABDOMINAL HYSTERECTOMY  1971  . CATARACT EXTRACTION W/ INTRAOCULAR LENS  IMPLANT, BILATERAL Bilateral 2016   Dr. Rutherford Guys  . IRRIGATION AND DEBRIDEMENT ABSCESS Left 02/23/2017   Procedure: IRRIGATION AND DEBRIDEMENT ABSCESS;  Surgeon: Mcarthur Rossetti, MD;  Location: [facility];  Service: Orthopedics;  Laterality: Left;  . REPAIR OF PERFORATED ULCER  1973 &1975   Dr Vida Rigger  . STOMACH SURGERY  [phone]   removed 1/2 stomach; "had a total of 5 ORs from 1973-1980; all related to perforated ulcer"  . TONSILLECTOMY      Allergies  Allergen Reactions  . Cymbalta [Duloxetine Hcl] Diarrhea    Outpatient Encounter Medications as of 09/14/2019  Medication Sig  . ALPRAZolam (XANAX) 0.25 MG tablet TAKE 1 TABLET BY MOUTH AT BEDTIME  . BYSTOLIC 10 MG tablet TAKE 1/2 TABLET(5 MG)  BY MOUTH DAILY  . EXFORGE 10-320 MG tablet TAKE 1 TABLET BY MOUTH EVERY DAY  . gabapentin (NEURONTIN) 300 MG capsule TAKE 1 CAPSULE(300 MG) BY MOUTH AT BEDTIME  . MYRBETRIQ 25 MG TB24 tablet Take 25 mg by mouth at bedtime.   . [DISCONTINUED] cyanocobalamin (,VITAMIN B-12,) 1000 MCG/ML injection Inject 1 mL (1,000 mcg total) into the muscle every 30 (thirty) days.   No facility-administered encounter medications on file as of 09/14/2019.    Review of Systems:  Review of Systems  Constitutional: Negative for chills, fever, malaise/fatigue and weight loss.  HENT: Negative for congestion.   Eyes: Negative for blurred vision (glasses).  Respiratory: Negative for cough and shortness of breath.   Cardiovascular: Negative for chest pain,  palpitations and leg swelling.  Gastrointestinal: Negative for abdominal pain, constipation and diarrhea.  Genitourinary: Negative for dysuria.       OAB better on myrbetriq  Musculoskeletal: Negative for falls and joint pain.  Skin: Positive for itching (of scalp especially top). Negative for rash.  Neurological: Positive for tingling and sensory change. Negative for loss of consciousness.       Symptoms on scalp  Endo/Heme/Allergies: Does not bruise/bleed easily.  Psychiatric/Behavioral: Negative for depression and memory loss (seems she is not retaining (?accepting) what she's told about her scalp problem ). The patient is nervous/anxious. The patient does not have insomnia.     Health Maintenance  Topic Date Due  . FOOT EXAM  Never done  . HEMOGLOBIN A1C  06/27/2019  . OPHTHALMOLOGY EXAM  11/16/2019 (Originally 07/17/2017)  . COVID-19 Vaccine  Completed  . MAMMOGRAM  Discontinued    Physical Exam: Vitals:   09/14/19 1303  Weight: 111 lb (50.3 kg)   Body mass index is 19.66 kg/m. Physical Exam Vitals reviewed.  Constitutional:      General: She is not in acute distress.    Appearance: Normal appearance. She is not toxic-appearing.  HENT:     Head: Normocephalic and atraumatic.  Cardiovascular:     Rate and Rhythm: Normal rate and regular rhythm.     Pulses: Normal pulses.     Heart sounds: Normal heart sounds.  Pulmonary:     Effort: Pulmonary effort is normal.     Breath sounds: Normal breath sounds.  Musculoskeletal:        General: Normal range of motion.     Right lower leg: No edema.     Left lower leg: No edema.  Skin:    General: Skin is warm and dry.     Capillary Refill: Capillary refill takes less than 2 seconds.  Neurological:     General: No focal deficit present.     Mental Status: She is alert and oriented to person, place, and time.     Cranial Nerves: No cranial nerve deficit.     Sensory: No sensory deficit.     Motor: No weakness.      Coordination: Coordination normal.     Gait: Gait normal.     Deep Tendon Reflexes: Reflexes normal.  Psychiatric:        Mood and Affect: Mood normal.     Labs reviewed: Basic Metabolic Panel: Recent Labs    11/24/18 1521 12/28/18 0802 03/30/19 0000  NA 138 137 138  K 4.4 4.8 4.0  CL 104 107 109  CO2 24 21 21   GLUCOSE 110 100* 100*  BUN 22 21 25   CREATININE 1.53* 1.51* 1.41*  CALCIUM 10.0 9.5 9.2   Liver  Function Tests: Recent Labs    11/24/18 1521  AST 20  ALT 9  BILITOT 0.3  PROT 7.2   No results for input(s): LIPASE, AMYLASE in the last 8760 hours. No results for input(s): AMMONIA in the last 8760 hours. CBC: Recent Labs    12/28/18 0802 03/30/19 0000 05/11/19 1128  WBC 7.0 6.6 6.9  NEUTROABS 3,766 3,498 4.0  HGB 9.4* 10.1* 9.6*  HCT 29.6* 32.4* 30.5*  MCV 81.1 80.2 79.1  PLT 347 363 342.0   Lab Results  Component Value Date   HGBA1C 5.8 (H) 12/28/2018   Assessment/Plan 1. Neuropathy - derm and myself have said she has a neuropathic condition of her scalp  -she feels no one has given her a good answer and requests a referral to neurology for further investigation - Ambulatory referral to Neurology  2. Itchy scalp - Ambulatory referral to Neurology  3. Iron deficiency anemia secondary to inadequate dietary iron intake - says she never got iron infusions that were originally recommended by GI - CBC with Differential/Platelet  4. Hyperglycemia - foot exam done today (and saw podiatry twice but diabetic foot exam had shown) - f/u hba1c - COMPLETE METABOLIC PANEL WITH GFR - Hemoglobin A1c  5. Other vitamin B12 deficiency anemia - f/u labs - not taking b12 anymore b/c she didn't feel better from it though levels had been lower than desired - CBC with Differential/Platelet - Vitamin B12  6. Stage 3b chronic kidney disease -Avoid nephrotoxic agents like nsaids, dose adjust renally excreted meds, hydrate. - COMPLETE METABOLIC PANEL WITH GFR   Labs/tests ordered:   Lab Orders     CBC with Differential/Platelet     COMPLETE METABOLIC PANEL WITH GFR  Next appt:  4 mos med mgt  Vinh Sachs L. Eathel Pajak, D.O. [facility] [address], [place] [address] Cell Phone (Mon-Fri 8am-5pm):  [phone] On Call:  [phone] & follow prompts after 5pm & weekends Office Phone:  [phone] Office Fax:  [phone]

## 2019-09-15 LAB — COMPLETE METABOLIC PANEL WITH GFR
AG Ratio: 1.7 (calc) (ref 1.0–2.5)
ALT: 11 U/L (ref 6–29)
AST: 20 U/L (ref 10–35)
Albumin: 4.7 g/dL (ref 3.6–5.1)
Alkaline phosphatase (APISO): 99 U/L (ref 37–153)
BUN/Creatinine Ratio: 13 (calc) (ref 6–22)
BUN: 20 mg/dL (ref 7–25)
CO2: 18 mmol/L — ABNORMAL LOW (ref 20–32)
Calcium: 9.5 mg/dL (ref 8.6–10.4)
Chloride: 104 mmol/L (ref 98–110)
Creat: 1.52 mg/dL — ABNORMAL HIGH (ref 0.60–0.88)
GFR, Est African American: 37 mL/min/{1.73_m2} — ABNORMAL LOW (ref >=60)
GFR, Est Non African American: 32 mL/min/{1.73_m2} — ABNORMAL LOW (ref >=60)
Globulin: 2.8 g/dL (calc) (ref 1.9–3.7)
Glucose, Bld: 100 mg/dL (ref 65–139)
Potassium: 4.3 mmol/L (ref 3.5–5.3)
Sodium: 134 mmol/L — ABNORMAL LOW (ref 135–146)
Total Bilirubin: 0.3 mg/dL (ref 0.2–1.2)
Total Protein: 7.5 g/dL (ref 6.1–8.1)

## 2019-09-15 LAB — CBC WITH DIFFERENTIAL/PLATELET
Absolute Monocytes: 593 cells/uL (ref 200–950)
Basophils Absolute: 53 cells/uL (ref 0–200)
Basophils Relative: 0.7 %
Eosinophils Absolute: 188 cells/uL (ref 15–500)
Eosinophils Relative: 2.5 %
HCT: 30.2 % — ABNORMAL LOW (ref 35.0–45.0)
Hemoglobin: 9.3 g/dL — ABNORMAL LOW (ref 11.7–15.5)
Lymphs Abs: 2310 cells/uL (ref 850–3900)
MCH: 24.2 pg — ABNORMAL LOW (ref 27.0–33.0)
MCHC: 30.8 g/dL — ABNORMAL LOW (ref 32.0–36.0)
MCV: 78.6 fL — ABNORMAL LOW (ref 80.0–100.0)
MPV: 10.5 fL (ref 7.5–12.5)
Monocytes Relative: 7.9 %
Neutro Abs: 4358 cells/uL (ref 1500–7800)
Neutrophils Relative %: 58.1 %
Platelets: 328 10*3/uL (ref 140–400)
RBC: 3.84 10*6/uL (ref 3.80–5.10)
RDW: 15.7 % — ABNORMAL HIGH (ref 11.0–15.0)
Total Lymphocyte: 30.8 %
WBC: 7.5 10*3/uL (ref 3.8–10.8)

## 2019-09-15 LAB — VITAMIN B12: Vitamin B-12: 309 pg/mL (ref 200–1100)

## 2019-09-15 LAB — HEMOGLOBIN A1C
Hgb A1c MFr Bld: 5.9 % of total Hgb — ABNORMAL HIGH (ref <5.7)
Mean Plasma Glucose: 123 (calc)
eAG (mmol/L): 6.8 (calc)

## 2019-09-15 NOTE — Progress Notes (Signed)
She has mychart so I recommend she read them on there because this is likely to result in a back and forth for days.  B12 level is better than before any injections, but has dropped back down under the goal for older adults which is more than 400-500.   Sugar average has trended up just a tiny bit--remains in prediabetic range Kidney function is stable when I look back over time for her--GFR is 32 consistent with her chronic kidney disease.  Encourage adequate hydration and avoiding nsaids like ibuprofen, aleve, goody's, etc.  Tylenol is ok for aches and pains. Sodium was slightly low. This could be from drinking water in the heat and not repleting sodium.  We'll monitor this when she returns. Anemia is worse since we're not doing anything about it.  It looks more iron deficient than b12 deficient based on the size of the blood cells.  I recommend she take a prenatal multivitamin to try to increase the iron in her system since iron pills are hard to tolerate (nausea, constipation).

## 2019-09-17 ENCOUNTER — Other Ambulatory Visit: Payer: Self-pay | Admitting: Internal Medicine

## 2019-09-17 DIAGNOSIS — F32A Depression, unspecified: Secondary | ICD-10-CM

## 2019-09-18 NOTE — Telephone Encounter (Signed)
Received refill request from pharmacy Pended Rx and sent to Dr. Reed for approval.  

## 2019-09-26 DIAGNOSIS — Z961 Presence of intraocular lens: Secondary | ICD-10-CM | POA: Diagnosis not present

## 2019-09-26 DIAGNOSIS — H353133 Nonexudative age-related macular degeneration, bilateral, advanced atrophic without subfoveal involvement: Secondary | ICD-10-CM | POA: Diagnosis not present

## 2019-09-26 DIAGNOSIS — H5213 Myopia, bilateral: Secondary | ICD-10-CM | POA: Diagnosis not present

## 2019-09-26 DIAGNOSIS — H52203 Unspecified astigmatism, bilateral: Secondary | ICD-10-CM | POA: Diagnosis not present

## 2019-09-26 DIAGNOSIS — H35363 Drusen (degenerative) of macula, bilateral: Secondary | ICD-10-CM | POA: Diagnosis not present

## 2019-10-09 DIAGNOSIS — R3915 Urgency of urination: Secondary | ICD-10-CM | POA: Diagnosis not present

## 2019-10-09 DIAGNOSIS — R350 Frequency of micturition: Secondary | ICD-10-CM | POA: Diagnosis not present

## 2019-10-16 ENCOUNTER — Other Ambulatory Visit: Payer: Self-pay | Admitting: Internal Medicine

## 2019-10-16 DIAGNOSIS — F419 Anxiety disorder, unspecified: Secondary | ICD-10-CM

## 2019-10-16 NOTE — Telephone Encounter (Signed)
Rx last filled on 09/18/2019 in epic, non-opioid treatment agreement on file from 02/06/2020

## 2019-11-08 ENCOUNTER — Other Ambulatory Visit: Payer: Self-pay | Admitting: Internal Medicine

## 2019-11-08 DIAGNOSIS — I10 Essential (primary) hypertension: Secondary | ICD-10-CM

## 2019-11-09 DIAGNOSIS — R3915 Urgency of urination: Secondary | ICD-10-CM | POA: Diagnosis not present

## 2019-11-09 DIAGNOSIS — R350 Frequency of micturition: Secondary | ICD-10-CM | POA: Diagnosis not present

## 2019-11-11 ENCOUNTER — Other Ambulatory Visit: Payer: Self-pay | Admitting: Internal Medicine

## 2019-11-11 DIAGNOSIS — I10 Essential (primary) hypertension: Secondary | ICD-10-CM

## 2019-11-13 NOTE — Telephone Encounter (Signed)
rx sent to pharmacy by e-script  

## 2019-11-15 NOTE — Progress Notes (Addendum)
[facility]    Provider:  Dr Jaynee Eagles Requesting Provider: Gayland Curry, DO Primary Care Provider:  Gayland Curry, DO  CC:  Scalp itching  HPI:  [name] is a 84 y.o. female here as requested by Hollace Kinnier L, DO for itchy scalp.  She has a past medical history of B12 deficiency, sciatica, multinodular goiter nontoxic, hypertension, hyperlipidemia, herpes simplex, anxiety, anemia, alcoholism, age-related macular degeneration.  I reviewed notes, she has had "itchy scalp" for at least 2 years: Per Dr. Cyndi Lennert notes, patient feels as though she has had no direction in what to do about her scalp problem, she cannot distract her self from it anymore, she keeps cool help some of the time, she keeps a netting overhead to keep her from touching it, she seen Derm multiple times, she has been tried on gabapentin 300 mg at bedtime which Dr. Denna Haggard agreed with also, no rash, never been a rash, she says it makes her dizzy, Dr. Mariea Clonts thinks it is neuropathic, she feels like a bug biting, other times like something is moving around, patient feels as though no meds given her good answer and requests a referral to neurology.  Ongoing for 2.5 years. Almost 3 years ago she had left shoulder surgery but not right after and can't make a connection. Itstarted as a feeling that it felt nice to brush her hair, she was not sick prior, no new medications, no trauma, no known inciting events. Started slowly, it is everywhere where she has hair more intense in the crown, it does not radiate into her face. She does have neck discomfort and it may radiate into the neck and cream helps. The sensations are painful, on the whole scalp, compression helps, feels like insect bites, , tingling, crawling, actually itching may help immediately but then the itching comes back, never gets relief, no shampoo, or cream makes it better. She has seen her regular doctor, Hollace Kinnier, dermatologists. When she goes to sleep at  night it is ok, it does not wake her up but early morning she feels it. She had counseling for anxiety. She saw Dr. Prince Rome. She tried Gabapentin and it helps her sleep and when she stopped Gabapentin "every cell in my body screamed", and it helps the uncomfortable sensations. No other abnormal sensations or numbness/tingling/itching. Slowly progressed. No problems with neck or balance or weakness in the arms or legs. Doesn't change with movement of the neck. IT DOES radiate to the back of the neck as well.  Reviewed notes, labs and imaging from outside physicians, which showed:cbc with anemia, cmp shows creatinine 1.52, BUN 20, hgba1c 5.9  Review of Systems: Patient complains of symptoms per HPI as well as the following symptoms itching. Pertinent negatives and positives per HPI. All others negative.   Social History   Socioeconomic History  . Marital status: Divorced    Spouse name: Not on file  . Number of children: 0  . Years of education: Not on file  . Highest education level: Not on file  Occupational History  . Occupation: retired  Tobacco Use  . Smoking status: Former Smoker    Packs/day: 1.00    Years: 40.00    Pack years: 40.00    Types: Cigarettes    Quit date: 01/09/1994    Years since quitting: 25.8  . Smokeless tobacco: Never Used  Vaping Use  . Vaping Use: Never used  Substance and Sexual Activity  . Alcohol use: No  Comment: 02/25/2017 "recovering alcoholic [id number]"  . Drug use: Never  . Sexual activity: Not Currently  Other Topics Concern  . Not on file  Social History Narrative   Lives alone at home   Caffeine: 1 large cup each morning    Social Determinants of Health   Financial Resource Strain:   . Difficulty of Paying Living Expenses: Not on file  Food Insecurity:   . Worried About Charity fundraiser in the Last Year: Not on file  . Ran Out of Food in the Last Year: Not on file  Transportation Needs:   . Lack of Transportation (Medical): Not  on file  . Lack of Transportation (Non-Medical): Not on file  Physical Activity:   . Days of Exercise per Week: Not on file  . Minutes of Exercise per Session: Not on file  Stress:   . Feeling of Stress : Not on file  Social Connections:   . Frequency of Communication with Friends and Family: Not on file  . Frequency of Social Gatherings with Friends and Family: Not on file  . Attends Religious Services: Not on file  . Active Member of Clubs or Organizations: Not on file  . Attends Archivist Meetings: Not on file  . Marital Status: Not on file  Intimate Partner Violence:   . Fear of Current or Ex-Partner: Not on file  . Emotionally Abused: Not on file  . Physically Abused: Not on file  . Sexually Abused: Not on file    Family History  Problem Relation Age of Onset  . Obesity Mother   . Neuropathy Neg Hx     Past Medical History:  Diagnosis Date  . Age-related macular degeneration, dry, both eyes   . Alcoholism ([place])   . Anemia   . Anxiety   . Cholelithiasis   . Endometriosis   . Goiter   . Herpes simplex   . Hyperlipidemia   . Hypertension   . Loss of weight   . Nontoxic multinodular goiter   . Other abnormal blood chemistry   . PUD (peptic ulcer disease)   . Renal cyst   . Sciatica   . Scoliosis (and kyphoscoliosis), idiopathic   . Unspecified disorder of kidney and ureter   . Urinary frequency     Patient Active Problem List   Diagnosis Date Noted  . Anemia 12/08/2018  . Abdominal pain 11/24/2018  . Itchy scalp 01/10/2018  . Iron deficiency anemia secondary to inadequate dietary iron intake 01/10/2018  . Adhesive capsulitis of left shoulder 07/05/2017  . Streptococcal bacteremia 02/24/2017  . Abscess of left shoulder 02/24/2017  . Anxiety disorder 02/24/2017  . Recovering alcoholic in remission ([place]) 02/24/2017  . Septic bursitis 02/23/2017  . Insomnia 07/01/2015  . Genital herpes simplex type 2 11/03/2014  . Hyperglycemia 05/23/2012  .  Multinodular goiter 05/23/2012  . Essential hypertension, benign 05/23/2012    Past Surgical History:  Procedure Laterality Date  . ABDOMINAL HYSTERECTOMY  1971  . CATARACT EXTRACTION W/ INTRAOCULAR LENS  IMPLANT, BILATERAL Bilateral 2016   Dr. Rutherford Guys  . IRRIGATION AND DEBRIDEMENT ABSCESS Left 02/23/2017   Procedure: IRRIGATION AND DEBRIDEMENT ABSCESS;  Surgeon: Mcarthur Rossetti, MD;  Location: [facility];  Service: Orthopedics;  Laterality: Left;  . REPAIR OF PERFORATED ULCER  1973 &1975   Dr Vida Rigger  . STOMACH SURGERY  [phone]   removed 1/2 stomach; "had a total of 5 ORs from 1973-1980; all related to perforated ulcer"  . TONSILLECTOMY  Current Outpatient Medications  Medication Sig Dispense Refill  . ALPRAZolam (XANAX) 0.25 MG tablet TAKE 1 TABLET BY MOUTH AT BEDTIME 30 tablet 5  . BIOTIN PO Take by mouth.    . BYSTOLIC 10 MG tablet TAKE 1/2 TABLET(5 MG) BY MOUTH DAILY 45 tablet 1  . CRANBERRY PO Take by mouth.    . EXFORGE 10-320 MG tablet TAKE 1 TABLET BY MOUTH EVERY DAY 180 tablet 1  . Ferrous Sulfate (IRON PO) Take by mouth.    Marland Kitchen MYRBETRIQ 50 MG TB24 tablet Take 50 mg by mouth daily.    Marland Kitchen ALPRAZolam (XANAX) 0.25 MG tablet Take 1-2 tabs (0.25mg -0.50mg ) 30-60 minutes before procedure. May repeat if needed.Do not drive. 4 tablet 0  . gabapentin (NEURONTIN) 300 MG capsule TAKE 1 CAPSULE(300 MG) BY MOUTH AT BEDTIME 30 capsule 2   No current facility-administered medications for this visit.    Allergies as of 11/16/2019 - Review Complete 11/16/2019  Allergen Reaction Noted  . Cymbalta [duloxetine hcl] Diarrhea 04/19/2019    Vitals: BP (!) 167/80 (BP Location: Right Arm, Patient Position: Sitting)   Pulse 75   Ht 5\' 4"  (1.626 m)   Wt 109 lb (49.4 kg)   BMI 18.71 kg/m  Last Weight:  Wt Readings from Last 1 Encounters:  11/16/19 109 lb (49.4 kg)   Last Height:   Ht Readings from Last 1 Encounters:  11/16/19 5\' 4"  (1.626 m)     Physical  exam: Exam: Gen: NAD, conversant, thin                  CV: RRR, no MRG. No Carotid Bruits. No peripheral edema, warm, nontender Eyes: Conjunctivae clear without exudates or hemorrhage  Neuro: Detailed Neurologic Exam  Speech:    Speech is normal; fluent and spontaneous with normal comprehension.  Cognition:    The patient is oriented to person, place, and time;     recent and remote memory intact;     language fluent;     normal attention, concentration,     fund of knowledge Cranial Nerves:    The pupils are equal, round, and reactive to light. Attempted fiundoscopy could not visualize.  Visual fields are full. Extraocular movements are intact. Trigeminal sensation is intact and the muscles of mastication are normal. The face is symmetric. The palate elevates in the midline. Hearing intact. Voice is normal. Shoulder shrug is normal. The tongue has normal motion without fasciculations.   Coordination:    No dysmetria or ataxia  Gait:    Normal native gait, no ataxia or shuffling  Motor Observation:    No asymmetry, no atrophy, and no involuntary movements noted. Tone:    Normal muscle tone.    Posture:    Posture is normal. normal erect    Strength:     Strength is symmetrical in the upper and lower limbs.      Sensation: intact to LT     Reflex Exam:  DTR's: Hypo AJs. othrewise deep tendon reflexes in the upper and lower extremities are slightly brisk for age especially in the upper extremities   Toes:    The toes are downgoing bilaterally.   Clonus:    Clonus is absent.    Assessment/Plan: [age] year old her for evaluation of itchy scalp. Neuropathic itch has been documented however it is less likely than other causes. Unfortunately sounds like she has been to dermatology and other physicians.   She feels itch where hair is more intense  in the crown (point to V1/C2 dermatomes). Will image the cervical spine as the trigeminal nucleus caudalis is in the upper  cervical spine around C2 so if she has a lesion here could explain the symptoms. Otherwise we could also check the brain, less likely given symmetrical symptoms but can't rule it out.    - xanax - try only one for MRI - MRI cervical spine - Continue Gabapentin - Recommend another derm opinion, maybe a skin biopsy? If she nees referral happy to give it  Other causes? Patients with small fiber neuropathy can feel itching however it is very uncommon for this to present only in the scalp and is usually length dependent. If the cervical spine does not shed any light we can also check an MRI of the brain for any diseases of the central nervous system such as strokes or otherwise.  There are also psychiatric conditions that can cause this but she appears to have good insight into her anxiety so less likely.    Since this spans C2-C3, bilaterally, obviously would be unusual for postherpetic neuralgia. Will see what the MRI cervical spine shows.   Continue gabapentin.  Orders Placed This Encounter  Procedures  . MR CERVICAL SPINE WO CONTRAST   Meds ordered this encounter  Medications  . ALPRAZolam (XANAX) 0.25 MG tablet    Sig: Take 1-2 tabs (0.25mg -0.50mg ) 30-60 minutes before procedure. May repeat if needed.Do not drive.    Dispense:  4 tablet    Refill:  0    Cc: Blanchie Serve, DO  Sarina Ill, MD  [facility] [address] [place], [place] [address]  Phone [phone] Fax [phone]  I spent over 60 minutes of face-to-face and non-face-to-face time with patient on the  1. Scalp itch   2. Disease of spinal cord (HCC)   3. Radiculopathy of occipito-atlanto-axial region   4. Scalp pain   5. Abnormal deep tendon reflex    diagnosis.  This included previsit chart review, lab review, study review, order entry, electronic health record documentation, patient education on the different diagnostic and therapeutic options,  counseling and coordination of care, risks and benefits of management, compliance, or risk factor reduction

## 2019-11-16 ENCOUNTER — Other Ambulatory Visit: Payer: Self-pay

## 2019-11-16 ENCOUNTER — Encounter: Payer: Self-pay | Admitting: Neurology

## 2019-11-16 ENCOUNTER — Telehealth: Payer: Self-pay | Admitting: Neurology

## 2019-11-16 ENCOUNTER — Ambulatory Visit (INDEPENDENT_AMBULATORY_CARE_PROVIDER_SITE_OTHER): Payer: Medicare Other | Admitting: Neurology

## 2019-11-16 ENCOUNTER — Other Ambulatory Visit: Payer: Self-pay | Admitting: Dermatology

## 2019-11-16 VITALS — BP 167/80 | HR 75 | Ht 64.0 in | Wt 109.0 lb

## 2019-11-16 DIAGNOSIS — M5411 Radiculopathy, occipito-atlanto-axial region: Secondary | ICD-10-CM | POA: Diagnosis not present

## 2019-11-16 DIAGNOSIS — R519 Headache, unspecified: Secondary | ICD-10-CM | POA: Diagnosis not present

## 2019-11-16 DIAGNOSIS — L299 Pruritus, unspecified: Secondary | ICD-10-CM | POA: Diagnosis not present

## 2019-11-16 DIAGNOSIS — G959 Disease of spinal cord, unspecified: Secondary | ICD-10-CM | POA: Diagnosis not present

## 2019-11-16 DIAGNOSIS — R292 Abnormal reflex: Secondary | ICD-10-CM

## 2019-11-16 MED ORDER — ALPRAZOLAM 0.25 MG PO TABS: ORAL_TABLET | ORAL | 0 refills | Status: DC

## 2019-11-16 NOTE — Addendum Note (Signed)
Addended by: Sarina Ill B on: 11/16/2019 02:47 PM   Modules accepted: Orders

## 2019-11-16 NOTE — Telephone Encounter (Signed)
[employer] medicare/medicaid order sent to GI. No auth they will reach out to the patient to schedule.  °

## 2019-11-16 NOTE — Patient Instructions (Signed)
MRI of the cervical spine Xanax - don't drive

## 2019-11-28 ENCOUNTER — Ambulatory Visit: Payer: Medicare Other

## 2019-12-05 ENCOUNTER — Other Ambulatory Visit: Payer: Medicare Other

## 2019-12-05 ENCOUNTER — Ambulatory Visit: Payer: Medicare Other

## 2019-12-07 ENCOUNTER — Encounter: Payer: Self-pay | Admitting: Dermatology

## 2019-12-07 ENCOUNTER — Other Ambulatory Visit: Payer: Self-pay

## 2019-12-07 ENCOUNTER — Ambulatory Visit (INDEPENDENT_AMBULATORY_CARE_PROVIDER_SITE_OTHER): Payer: Medicare Other | Admitting: Dermatology

## 2019-12-07 DIAGNOSIS — R208 Other disturbances of skin sensation: Secondary | ICD-10-CM | POA: Diagnosis not present

## 2019-12-11 DIAGNOSIS — R350 Frequency of micturition: Secondary | ICD-10-CM | POA: Diagnosis not present

## 2019-12-11 DIAGNOSIS — R3915 Urgency of urination: Secondary | ICD-10-CM | POA: Diagnosis not present

## 2019-12-12 ENCOUNTER — Other Ambulatory Visit: Payer: Self-pay

## 2019-12-12 ENCOUNTER — Ambulatory Visit
Admission: RE | Admit: 2019-12-12 | Discharge: 2019-12-12 | Disposition: A | Discharge status: Home / Self Care | Payer: Medicare Other | Source: Ambulatory Visit | Attending: Neurology | Admitting: Neurology

## 2019-12-12 DIAGNOSIS — G959 Disease of spinal cord, unspecified: Secondary | ICD-10-CM

## 2019-12-14 ENCOUNTER — Telehealth: Payer: Self-pay | Admitting: *Deleted

## 2019-12-14 NOTE — Telephone Encounter (Signed)
LVM informing patient MRI of the cervical spina did not show any reason for her scalp symptoms unfortunately. Dr Jaynee Eagles discussed MRI brain with her, would  she like to proceed with that. Left # for call back.

## 2019-12-18 NOTE — Telephone Encounter (Signed)
Called patient to discuss possible MRI brain. She stated she doesn't feel a reason to have MRI brain but thanked me for call.

## 2019-12-25 DIAGNOSIS — M6289 Other specified disorders of muscle: Secondary | ICD-10-CM | POA: Diagnosis not present

## 2019-12-25 DIAGNOSIS — M6281 Muscle weakness (generalized): Secondary | ICD-10-CM | POA: Diagnosis not present

## 2019-12-25 DIAGNOSIS — M62838 Other muscle spasm: Secondary | ICD-10-CM | POA: Diagnosis not present

## 2019-12-26 ENCOUNTER — Other Ambulatory Visit: Payer: Self-pay | Admitting: Internal Medicine

## 2019-12-26 DIAGNOSIS — I10 Essential (primary) hypertension: Secondary | ICD-10-CM

## 2020-01-18 ENCOUNTER — Other Ambulatory Visit: Payer: Self-pay

## 2020-01-18 ENCOUNTER — Encounter: Payer: Self-pay | Admitting: Internal Medicine

## 2020-01-18 ENCOUNTER — Ambulatory Visit (INDEPENDENT_AMBULATORY_CARE_PROVIDER_SITE_OTHER): Payer: Medicare Other | Admitting: Internal Medicine

## 2020-01-18 VITALS — BP 118/60 | HR 86 | Ht 64.0 in | Wt 108.1 lb

## 2020-01-18 DIAGNOSIS — N1832 Chronic kidney disease, stage 3b: Secondary | ICD-10-CM | POA: Diagnosis not present

## 2020-01-18 DIAGNOSIS — N393 Stress incontinence (female) (male): Secondary | ICD-10-CM | POA: Insufficient documentation

## 2020-01-18 DIAGNOSIS — F1021 Alcohol dependence, in remission: Secondary | ICD-10-CM

## 2020-01-18 DIAGNOSIS — F419 Anxiety disorder, unspecified: Secondary | ICD-10-CM | POA: Diagnosis not present

## 2020-01-18 DIAGNOSIS — N3281 Overactive bladder: Secondary | ICD-10-CM | POA: Insufficient documentation

## 2020-01-18 DIAGNOSIS — E042 Nontoxic multinodular goiter: Secondary | ICD-10-CM

## 2020-01-18 DIAGNOSIS — R739 Hyperglycemia, unspecified: Secondary | ICD-10-CM

## 2020-01-18 DIAGNOSIS — D508 Other iron deficiency anemias: Secondary | ICD-10-CM

## 2020-01-18 DIAGNOSIS — I10 Essential (primary) hypertension: Secondary | ICD-10-CM

## 2020-01-18 DIAGNOSIS — L299 Pruritus, unspecified: Secondary | ICD-10-CM | POA: Diagnosis not present

## 2020-01-18 MED ORDER — LORAZEPAM 0.5 MG PO TABS: 0.5000 mg | ORAL_TABLET | Freq: Two times a day (BID) | ORAL | 0 refills | Status: DC

## 2020-01-18 NOTE — Patient Instructions (Signed)
Start your new anxiety medication:  Lorazepam 0.5mg  by mouth twice a day tomorrow morning.  Let me know if you have any problems from it.

## 2020-01-18 NOTE — Progress Notes (Signed)
Location:  [facility] clinic Provider:  Deven Furia L. Mariea Clonts, D.O., C.M.D.  Code Status: DNR Goals of Care:  Advanced Directives 01/18/2020  Does Patient Have a Medical Advance Directive? Yes  Type of Advance Directive Out of facility DNR (pink MOST or yellow form)  Does patient want to make changes to medical advance directive? No - Patient declined  Copy of [place] in Chart? -  Would patient like information on creating a medical advance directive? -  Pre-existing out of facility DNR order (yellow form or pink MOST form) -   Chief Complaint  Patient presents with  . Medical Management of Chronic Issues    4 month follow up   . Acute Visit    discuss bladder leakage,  PT  urology , scalp condition     HPI: Patient is a 84 y.o. female seen today for medical management of chronic diseases.    BP had been running high, but today is normal.  Tolerating bystolic and exforge.    Her OAB is better--less frequency at night.  She is now having bladder leakage.  She's wearing a little undergarment which is ok most of the time.  [facility]'s seeing [facility] about this.  She's getting some PT through them.  She is going to have an internal exam to check her bladder walls.    Scalp condition continues.  She had an MRI of her brain/head which was not abnormal.  She recommended continuing the gabapentin--it does help at night especially.  The dermatologist said there was no way to biopsy her scalp with the inside and outside.  She's back to living with it.    She's wondering if there isn't something to more evenly help her out than the xanax that works short-term.  She's willing to accept that there is anxiety connected to it.  She's reluctant to give up the xanax, but does want more continuous relief.   She finds herself picking at her scalp and ears.     Past Medical History:  Diagnosis Date  . Age-related macular degeneration, dry, both eyes   . Alcoholism ([place])   . Anemia   .  Anxiety   . Cholelithiasis   . Endometriosis   . Goiter   . Herpes simplex   . Hyperlipidemia   . Hypertension   . Loss of weight   . Nontoxic multinodular goiter   . Other abnormal blood chemistry   . PUD (peptic ulcer disease)   . Renal cyst   . Sciatica   . Scoliosis (and kyphoscoliosis), idiopathic   . Unspecified disorder of kidney and ureter   . Urinary frequency     Past Surgical History:  Procedure Laterality Date  . ABDOMINAL HYSTERECTOMY  1971  . CATARACT EXTRACTION W/ INTRAOCULAR LENS  IMPLANT, BILATERAL Bilateral 2016   Dr. Rutherford Guys  . IRRIGATION AND DEBRIDEMENT ABSCESS Left 02/23/2017   Procedure: IRRIGATION AND DEBRIDEMENT ABSCESS;  Surgeon: Mcarthur Rossetti, MD;  Location: [facility];  Service: Orthopedics;  Laterality: Left;  . REPAIR OF PERFORATED ULCER  1973 &1975   Dr Vida Rigger  . STOMACH SURGERY  [phone]   removed 1/2 stomach; "had a total of 5 ORs from 1973-1980; all related to perforated ulcer"  . TONSILLECTOMY      Allergies  Allergen Reactions  . Cymbalta [Duloxetine Hcl] Diarrhea    Outpatient Encounter Medications as of 01/18/2020  Medication Sig  . ALPRAZolam (XANAX) 0.25 MG tablet TAKE 1 TABLET BY MOUTH AT  BEDTIME  . BIOTIN PO Take by mouth.  . BYSTOLIC 10 MG tablet TAKE 1/2 TABLET(5 MG) BY MOUTH DAILY  . CRANBERRY PO Take by mouth.  . EXFORGE 10-320 MG tablet TAKE 1 TABLET BY MOUTH EVERY DAY  . Ferrous Sulfate (IRON PO) Take by mouth.  . gabapentin (NEURONTIN) 300 MG capsule TAKE 1 CAPSULE(300 MG) BY MOUTH AT BEDTIME  . MYRBETRIQ 50 MG TB24 tablet Take 50 mg by mouth daily.  . [DISCONTINUED] ALPRAZolam (XANAX) 0.25 MG tablet Take 1-2 tabs (0.25mg -0.50mg ) 30-60 minutes before procedure. May repeat if needed.Do not drive.   No facility-administered encounter medications on file as of 01/18/2020.    Review of Systems:  Review of Systems  Constitutional: Negative for chills, fever and malaise/fatigue.  HENT: Negative for congestion and  sore throat.   Eyes: Negative for blurred vision.  Respiratory: Negative for cough and shortness of breath.   Cardiovascular: Negative for chest pain, palpitations and leg swelling.  Gastrointestinal: Negative for abdominal pain and diarrhea.  Genitourinary: Positive for urgency. Negative for dysuria and frequency.  Musculoskeletal: Negative for falls and joint pain.  Neurological: Negative for dizziness and loss of consciousness.  Psychiatric/Behavioral: Negative for depression and memory loss. The patient is nervous/anxious.     Health Maintenance  Topic Date Due  . OPHTHALMOLOGY EXAM  07/17/2017  . HEMOGLOBIN A1C  03/16/2020  . FOOT EXAM  09/13/2020  . COVID-19 Vaccine  Completed  . MAMMOGRAM  Discontinued    Physical Exam: Vitals:   01/18/20 1418  BP: 118/60  Pulse: 86  SpO2: 98%  Weight: 108 lb 1.6 oz (49 kg)  Height: 5\' 4"  (1.626 m)   Body mass index is 18.56 kg/m. Physical Exam Vitals reviewed.  Constitutional:      General: She is not in acute distress.    Appearance: Normal appearance. She is not toxic-appearing.  HENT:     Head: Normocephalic and atraumatic.  Eyes:     Comments: glasses  Cardiovascular:     Rate and Rhythm: Normal rate and regular rhythm.     Pulses: Normal pulses.     Heart sounds: Normal heart sounds. No murmur heard.   Pulmonary:     Effort: Pulmonary effort is normal. No respiratory distress.     Breath sounds: Normal breath sounds. No wheezing, rhonchi or rales.  Musculoskeletal:        General: Normal range of motion.     Right lower leg: No edema.     Left lower leg: No edema.  Skin:    General: Skin is warm and dry.  Neurological:     General: No focal deficit present.     Mental Status: She is alert and oriented to person, place, and time.  Psychiatric:        Mood and Affect: Mood normal.     Labs reviewed: Basic Metabolic Panel: Recent Labs    03/30/19 0000 09/14/19 1404  NA 138 134*  K 4.0 4.3  CL 109 104   CO2 21 18*  GLUCOSE 100* 100  BUN 25 20  CREATININE 1.41* 1.52*  CALCIUM 9.2 9.5   Liver Function Tests: Recent Labs    09/14/19 1404  AST 20  ALT 11  BILITOT 0.3  PROT 7.5   No results for input(s): LIPASE, AMYLASE in the last 8760 hours. No results for input(s): AMMONIA in the last 8760 hours. CBC: Recent Labs    03/30/19 0000 05/11/19 1128 09/14/19 1404  WBC 6.6 6.9 7.5  NEUTROABS 3,498 4.0 4,358  HGB 10.1* 9.6* 9.3*  HCT 32.4* 30.5* 30.2*  MCV 80.2 79.1 78.6*  PLT 363 342.0 328   Lipid Panel: No results for input(s): CHOL, HDL, LDLCALC, TRIG, CHOLHDL, LDLDIRECT in the last 8760 hours. Lab Results  Component Value Date   HGBA1C 5.9 (H) 09/14/2019     Assessment/Plan 1. Anxiety - d/c alprazolam - try ativan for longer-acting benzo for improved symptom mgt - LORazepam (ATIVAN) 0.5 MG tablet; Take 1 tablet (0.5 mg total) by mouth 2 (two) times daily.  Dispense: 60 tablet; Refill: 0  2. Itchy scalp -cont gabapentin and try lorazepam to improve relief  3. Essential hypertension, benign -bp good here, cont same regimen and monitor  4. Stage 3b chronic kidney disease (HCC) -Avoid nephrotoxic agents like nsaids, dose adjust renally excreted meds, hydrate. - CBC with Differential/Platelet; Future - COMPLETE METABOLIC PANEL WITH GFR; Future  5. Recovering alcoholic in remission ([place]) -no recent related problems  6. Overactive bladder -cont myrbetriq therapy  7. Stress incontinence -cont PT at urology  8. Iron deficiency anemia secondary to inadequate dietary iron intake - has improved, recheck before next visit - CBC with Differential/Platelet; Future  9. Hyperglycemia - has been ok recently, f/u labs - CBC with Differential/Platelet; Future - COMPLETE METABOLIC PANEL WITH GFR; Future - Hemoglobin A1c; Future  10. Multinodular goiter - f/u thyroid tests - TSH; Future  Labs/tests ordered:   Lab Orders     CBC with Differential/Platelet      COMPLETE METABOLIC PANEL WITH GFR     Hemoglobin A1c     TSH   Next appt:  6 mos with fasting labs before   Basim Bartnik L. Ladonte Verstraete, D.O. [facility] [address] [place], [place] [address] Cell Phone (Mon-Fri 8am-5pm):  [phone] On Call:  [phone] & follow prompts after 5pm & weekends Office Phone:  [phone] Office Fax:  [phone]

## 2020-01-30 DIAGNOSIS — M6281 Muscle weakness (generalized): Secondary | ICD-10-CM | POA: Diagnosis not present

## 2020-01-30 DIAGNOSIS — M6289 Other specified disorders of muscle: Secondary | ICD-10-CM | POA: Diagnosis not present

## 2020-01-30 DIAGNOSIS — M62838 Other muscle spasm: Secondary | ICD-10-CM | POA: Diagnosis not present

## 2020-02-13 DIAGNOSIS — R3915 Urgency of urination: Secondary | ICD-10-CM | POA: Diagnosis not present

## 2020-02-13 DIAGNOSIS — R350 Frequency of micturition: Secondary | ICD-10-CM | POA: Diagnosis not present

## 2020-02-13 DIAGNOSIS — M6281 Muscle weakness (generalized): Secondary | ICD-10-CM | POA: Diagnosis not present

## 2020-02-14 ENCOUNTER — Other Ambulatory Visit: Payer: Self-pay | Admitting: Dermatology

## 2020-02-18 ENCOUNTER — Other Ambulatory Visit: Payer: Self-pay | Admitting: Internal Medicine

## 2020-02-18 DIAGNOSIS — F419 Anxiety disorder, unspecified: Secondary | ICD-10-CM

## 2020-02-19 NOTE — Telephone Encounter (Signed)
rx sent to pharmacy by e-script. Sent to Dr. Reed for approval   

## 2020-02-22 DIAGNOSIS — R350 Frequency of micturition: Secondary | ICD-10-CM | POA: Diagnosis not present

## 2020-02-22 DIAGNOSIS — R3915 Urgency of urination: Secondary | ICD-10-CM | POA: Diagnosis not present

## 2020-02-22 DIAGNOSIS — R300 Dysuria: Secondary | ICD-10-CM | POA: Diagnosis not present

## 2020-03-05 DIAGNOSIS — M6289 Other specified disorders of muscle: Secondary | ICD-10-CM | POA: Diagnosis not present

## 2020-03-05 DIAGNOSIS — M62838 Other muscle spasm: Secondary | ICD-10-CM | POA: Diagnosis not present

## 2020-03-05 DIAGNOSIS — M6281 Muscle weakness (generalized): Secondary | ICD-10-CM | POA: Diagnosis not present

## 2020-04-08 ENCOUNTER — Encounter: Payer: Self-pay | Admitting: Internal Medicine

## 2020-04-29 ENCOUNTER — Other Ambulatory Visit: Payer: Self-pay

## 2020-04-29 ENCOUNTER — Encounter: Payer: Self-pay | Admitting: Internal Medicine

## 2020-04-29 ENCOUNTER — Ambulatory Visit (INDEPENDENT_AMBULATORY_CARE_PROVIDER_SITE_OTHER): Payer: Medicare Other | Admitting: Internal Medicine

## 2020-04-29 VITALS — BP 110/70 | HR 62 | Temp 97.0°F | Ht 64.0 in | Wt 112.4 lb

## 2020-04-29 DIAGNOSIS — L299 Pruritus, unspecified: Secondary | ICD-10-CM

## 2020-04-29 DIAGNOSIS — N3281 Overactive bladder: Secondary | ICD-10-CM

## 2020-04-29 DIAGNOSIS — F419 Anxiety disorder, unspecified: Secondary | ICD-10-CM | POA: Diagnosis not present

## 2020-04-29 MED ORDER — KETOCONAZOLE 2 % EX SHAM: 1.0000 "application " | MEDICATED_SHAMPOO | CUTANEOUS | 0 refills | Status: DC

## 2020-04-29 NOTE — Progress Notes (Signed)
Location:  [facility] clinic Provider: Airika Alkhatib L. Mariea Clonts, D.O., C.M.D.  Code Status: DNR Goals of Care:  Advanced Directives 01/18/2020  Does Patient Have a Medical Advance Directive? Yes  Type of Advance Directive Out of facility DNR (pink MOST or yellow form)  Does patient want to make changes to medical advance directive? No - Patient declined  Copy of Bureau in Chart? -  Would patient like information on creating a medical advance directive? -  Pre-existing out of facility DNR order (yellow form or pink MOST form) -     Chief Complaint  Patient presents with  . Acute Visit    Patient would like to inquire about psoriasis. Would like to know what treatment should be.The dermatologist she went to see was of no help. Seems to be spreading to eyes and ears.   . Quality Metric Gaps    Eye exam    HPI: Patient is a 85 y.o. female seen today for an acute visit for her ongoing itching of her scalp.  She's "done everything" including having an MRI.  She's done combining and cleaning of her scalp.  It's more livable.  She now thinks it is psoriasis.  Dr. Denna Haggard did not see anything and we have not either and neurologist has not either.  She is wanting a scraping done of her scalp to get a psoriasis diagnosis.  She is willing to admit there's anxiety, but thinks the psoriasis or whatever it is is what brings on the itching.  She's going to try kenkoderm shampoo.  She's willing to try nizoral shampoo also.    She's been going to [facility].  Every time she goes there, her bp is high.  Here it's normal or low.  She has difficulty with the right size cuff.    Past Medical History:  Diagnosis Date  . Age-related macular degeneration, dry, both eyes   . Alcoholism ([place])   . Anemia   . Anxiety   . Cholelithiasis   . Endometriosis   . Goiter   . Herpes simplex   . Hyperlipidemia   . Hypertension   . Loss of weight   . Nontoxic multinodular goiter   . Other abnormal  blood chemistry   . PUD (peptic ulcer disease)   . Renal cyst   . Sciatica   . Scoliosis (and kyphoscoliosis), idiopathic   . Unspecified disorder of kidney and ureter   . Urinary frequency     Past Surgical History:  Procedure Laterality Date  . ABDOMINAL HYSTERECTOMY  1971  . CATARACT EXTRACTION W/ INTRAOCULAR LENS  IMPLANT, BILATERAL Bilateral 2016   Dr. Rutherford Guys  . IRRIGATION AND DEBRIDEMENT ABSCESS Left 02/23/2017   Procedure: IRRIGATION AND DEBRIDEMENT ABSCESS;  Surgeon: Mcarthur Rossetti, MD;  Location: [place];  Service: Orthopedics;  Laterality: Left;  . REPAIR OF PERFORATED ULCER  1973 &1975   Dr Vida Rigger  . STOMACH SURGERY  [phone]   removed 1/2 stomach; "had a total of 5 ORs from 1973-1980; all related to perforated ulcer"  . TONSILLECTOMY      Allergies  Allergen Reactions  . Cymbalta [Duloxetine Hcl] Diarrhea    Outpatient Encounter Medications as of 04/29/2020  Medication Sig  . BYSTOLIC 10 MG tablet TAKE 1/2 TABLET(5 MG) BY MOUTH DAILY  . CRANBERRY PO Take by mouth.  . EXFORGE 10-320 MG tablet TAKE 1 TABLET BY MOUTH EVERY DAY  . gabapentin (NEURONTIN) 300 MG capsule TAKE 1 CAPSULE(300 MG) BY MOUTH  AT BEDTIME  . LORazepam (ATIVAN) 0.5 MG tablet TAKE 1 TABLET(0.5 MG) BY MOUTH TWICE DAILY  . MYRBETRIQ 50 MG TB24 tablet Take 50 mg by mouth daily.  . [DISCONTINUED] BIOTIN PO Take by mouth.  . [DISCONTINUED] Ferrous Sulfate (IRON PO) Take by mouth.  . [DISCONTINUED] sulfamethoxazole-trimethoprim (BACTRIM DS) 800-160 MG tablet Take 1 tablet by mouth 2 (two) times daily.   No facility-administered encounter medications on file as of 04/29/2020.    Review of Systems:  Review of Systems  Constitutional: Negative for chills and fever.  HENT: Negative for congestion and sore throat.   Eyes: Negative for blurred vision.  Respiratory: Negative for cough and shortness of breath.   Cardiovascular: Negative for chest pain and palpitations.  Gastrointestinal:  Negative for abdominal pain.  Genitourinary: Positive for frequency and urgency. Negative for dysuria.  Musculoskeletal: Negative for falls, joint pain and myalgias.  Skin: Positive for itching. Negative for rash.       Of scalp long-term; has seen derm, neurology  Neurological: Positive for sensory change. Negative for dizziness, tingling and loss of consciousness.  Psychiatric/Behavioral: Negative for depression. The patient is nervous/anxious. The patient does not have insomnia.        Seems to recall history of her scalp treatments,investigations, but continues to seek explanation    Health Maintenance  Topic Date Due  . OPHTHALMOLOGY EXAM  07/17/2017  . HEMOGLOBIN A1C  03/16/2020  . FOOT EXAM  09/13/2020  . COVID-19 Vaccine  Completed  . HPV VACCINES  Aged Out  . MAMMOGRAM  Discontinued    Physical Exam: Vitals:   04/29/20 1444  BP: 110/70  Pulse: 62  Temp: (!) 97 F (36.1 C)  SpO2: 98%  Weight: 112 lb 6.4 oz (51 kg)  Height: 5\' 4"  (1.626 m)   Body mass index is 19.29 kg/m. Physical Exam  Labs reviewed: Basic Metabolic Panel: Recent Labs    09/14/19 1404  NA 134*  K 4.3  CL 104  CO2 18*  GLUCOSE 100  BUN 20  CREATININE 1.52*  CALCIUM 9.5   Liver Function Tests: Recent Labs    09/14/19 1404  AST 20  ALT 11  BILITOT 0.3  PROT 7.5   No results for input(s): LIPASE, AMYLASE in the last 8760 hours. No results for input(s): AMMONIA in the last 8760 hours. CBC: Recent Labs    05/11/19 1128 09/14/19 1404  WBC 6.9 7.5  NEUTROABS 4.0 4,358  HGB 9.6* 9.3*  HCT 30.5* 30.2*  MCV 79.1 78.6*  PLT 342.0 328   Lipid Panel: No results for input(s): CHOL, HDL, LDLCALC, TRIG, CHOLHDL, LDLDIRECT in the last 8760 hours. Lab Results  Component Value Date   HGBA1C 5.9 (H) 09/14/2019    Procedures since last visit: No results found.  Assessment/Plan 1. Itchy scalp -has been to Dr. Denna Haggard a few times, myself several times, Dr. Jaynee Eagles at neurology -no  visible rash or dry scaly skin/scalp -at one time also had some neuropathic symptoms in her foot so gabapentin was tried which may have helped a little, but she wasn't certain so she stopped it -really wants skin scraping or biopsy done, but declined by derm due to nothing visible -MRI has been done due to possible neuropathic etiology also, but no abnormality seen -is wanting to try treatment for psoriasis as she's had it other places before so did provide Rx today but again did not see any dry scaly areas, redness, or explanation for the itching - ketoconazole (NIZORAL)  2 % shampoo; Apply 1 application topically 2 (two) times a week.  Dispense: 120 mL; Refill: 0  2. Anxiety -cont ativan bid for this but she does not believe this causes #1  3. Overactive bladder -cont myrbetriq therapy, followed by [facility]  Labs/tests ordered:   Lab Orders  No laboratory test(s) ordered today   Next appt:  Reschedule regular appts previously made with provider of choice here   Keishawn Darsey L. Nayda Riesen, D.O. [facility] [address], [place] [address] Cell Phone (Mon-Fri 8am-5pm):  [phone] On Call:  [phone] & follow prompts after 5pm & weekends Office Phone:  [phone] Office Fax:  [phone]

## 2020-04-30 ENCOUNTER — Ambulatory Visit (INDEPENDENT_AMBULATORY_CARE_PROVIDER_SITE_OTHER): Payer: Medicare Other | Admitting: Nurse Practitioner

## 2020-04-30 ENCOUNTER — Telehealth: Payer: Self-pay

## 2020-04-30 DIAGNOSIS — Z0000 Encounter for general adult medical examination without abnormal findings: Secondary | ICD-10-CM | POA: Diagnosis not present

## 2020-04-30 NOTE — Patient Instructions (Signed)
Ms. [name] , Thank you for taking time to come for your Medicare Wellness Visit. I appreciate your ongoing commitment to your health goals. Please review the following plan we discussed and let me know if I can assist you in the future.   Screening recommendations/referrals: Colonoscopy aged out Mammogram aged out Bone Density declined  Recommended yearly ophthalmology/optometry visit for glaucoma screening and checkup Recommended yearly dental visit for hygiene and checkup  Vaccinations: Influenza vaccine recommended yearly Pneumococcal vaccine recommend yearly Tdap vaccine recommended to get at your local pharmacy Shingles vaccine -RECOMMENDED- to get at your local pharmacy    Advanced directives: recommend to complete and put on file.   Conditions/risks identified: advance age.   Next appointment: 1 year for AWV   Preventive Care 83 Years and Older, Female Preventive care refers to lifestyle choices and visits with your health care provider that can promote health and wellness. What does preventive care include?  A yearly physical exam. This is also called an annual well check.  Dental exams once or twice a year.  Routine eye exams. Ask your health care provider how often you should have your eyes checked.  Personal lifestyle choices, including:  Daily care of your teeth and gums.  Regular physical activity.  Eating a healthy diet.  Avoiding tobacco and drug use.  Limiting alcohol use.  Practicing safe sex.  Taking low-dose aspirin every day.  Taking vitamin and mineral supplements as recommended by your health care provider. What happens during an annual well check? The services and screenings done by your health care provider during your annual well check will depend on your age, overall health, lifestyle risk factors, and family history of disease. Counseling  Your health care provider may ask you questions about your:  Alcohol use.  Tobacco use.  Drug  use.  Emotional well-being.  Home and relationship well-being.  Sexual activity.  Eating habits.  History of falls.  Memory and ability to understand (cognition).  Work and work Statistician.  Reproductive health. Screening  You may have the following tests or measurements:  Height, weight, and BMI.  Blood pressure.  Lipid and cholesterol levels. These may be checked every 5 years, or more frequently if you are over 18 years old.  Skin check.  Lung cancer screening. You may have this screening every year starting at age 83 if you have a 30-pack-year history of smoking and currently smoke or have quit within the past 15 years.  Fecal occult blood test (FOBT) of the stool. You may have this test every year starting at age 83.  Flexible sigmoidoscopy or colonoscopy. You may have a sigmoidoscopy every 5 years or a colonoscopy every 10 years starting at age 83.  Hepatitis C blood test.  Hepatitis B blood test.  Sexually transmitted disease (STD) testing.  Diabetes screening. This is done by checking your blood sugar (glucose) after you have not eaten for a while (fasting). You may have this done every 1-3 years.  Bone density scan. This is done to screen for osteoporosis. You may have this done starting at age 83.  Mammogram. This may be done every 1-2 years. Talk to your health care provider about how often you should have regular mammograms. Talk with your health care provider about your test results, treatment options, and if necessary, the need for more tests. Vaccines  Your health care provider may recommend certain vaccines, such as:  Influenza vaccine. This is recommended every year.  Tetanus, diphtheria, and acellular pertussis (  Tdap, Td) vaccine. You may need a Td booster every 10 years.  Zoster vaccine. You may need this after age 83.  Pneumococcal 13-valent conjugate (PCV13) vaccine. One dose is recommended after age 83.  Pneumococcal polysaccharide  (PPSV23) vaccine. One dose is recommended after age 83. Talk to your health care provider about which screenings and vaccines you need and how often you need them. This information is not intended to replace advice given to you by your health care provider. Make sure you discuss any questions you have with your health care provider. Document Released: 03/01/2015 Document Revised: 10/23/2015 Document Reviewed: 12/04/2014 Elsevier Interactive Patient Education  2017 [place] Prevention in the Home Falls can cause injuries. They can happen to people of all ages. There are many things you can do to make your home safe and to help prevent falls. What can I do on the outside of my home?  Regularly fix the edges of walkways and driveways and fix any cracks.  Remove anything that might make you trip as you walk through a door, such as a raised step or threshold.  Trim any bushes or trees on the path to your home.  Use bright outdoor lighting.  Clear any walking paths of anything that might make someone trip, such as rocks or tools.  Regularly check to see if handrails are loose or broken. Make sure that both sides of any steps have handrails.  Any raised decks and porches should have guardrails on the edges.  Have any leaves, snow, or ice cleared regularly.  Use sand or salt on walking paths during winter.  Clean up any spills in your garage right away. This includes oil or grease spills. What can I do in the bathroom?  Use night lights.  Install grab bars by the toilet and in the tub and shower. Do not use towel bars as grab bars.  Use non-skid mats or decals in the tub or shower.  If you need to sit down in the shower, use a plastic, non-slip stool.  Keep the floor dry. Clean up any water that spills on the floor as soon as it happens.  Remove soap buildup in the tub or shower regularly.  Attach bath mats securely with double-sided non-slip rug tape.  Do not have  throw rugs and other things on the floor that can make you trip. What can I do in the bedroom?  Use night lights.  Make sure that you have a light by your bed that is easy to reach.  Do not use any sheets or blankets that are too big for your bed. They should not hang down onto the floor.  Have a firm chair that has side arms. You can use this for support while you get dressed.  Do not have throw rugs and other things on the floor that can make you trip. What can I do in the kitchen?  Clean up any spills right away.  Avoid walking on wet floors.  Keep items that you use a lot in easy-to-reach places.  If you need to reach something above you, use a strong step stool that has a grab bar.  Keep electrical cords out of the way.  Do not use floor polish or wax that makes floors slippery. If you must use wax, use non-skid floor wax.  Do not have throw rugs and other things on the floor that can make you trip. What can I do with my stairs?  Do  not leave any items on the stairs.  Make sure that there are handrails on both sides of the stairs and use them. Fix handrails that are broken or loose. Make sure that handrails are as long as the stairways.  Check any carpeting to make sure that it is firmly attached to the stairs. Fix any carpet that is loose or worn.  Avoid having throw rugs at the top or bottom of the stairs. If you do have throw rugs, attach them to the floor with carpet tape.  Make sure that you have a light switch at the top of the stairs and the bottom of the stairs. If you do not have them, ask someone to add them for you. What else can I do to help prevent falls?  Wear shoes that:  Do not have high heels.  Have rubber bottoms.  Are comfortable and fit you well.  Are closed at the toe. Do not wear sandals.  If you use a stepladder:  Make sure that it is fully opened. Do not climb a closed stepladder.  Make sure that both sides of the stepladder are  locked into place.  Ask someone to hold it for you, if possible.  Clearly mark and make sure that you can see:  Any grab bars or handrails.  First and last steps.  Where the edge of each step is.  Use tools that help you move around (mobility aids) if they are needed. These include:  Canes.  Walkers.  Scooters.  Crutches.  Turn on the lights when you go into a dark area. Replace any light bulbs as soon as they burn out.  Set up your furniture so you have a clear path. Avoid moving your furniture around.  If any of your floors are uneven, fix them.  If there are any pets around you, be aware of where they are.  Review your medicines with your doctor. Some medicines can make you feel dizzy. This can increase your chance of falling. Ask your doctor what other things that you can do to help prevent falls. This information is not intended to replace advice given to you by your health care provider. Make sure you discuss any questions you have with your health care provider. Document Released: 11/29/2008 Document Revised: 07/11/2015 Document Reviewed: 03/09/2014 Elsevier Interactive Patient Education  2017 [employer].

## 2020-04-30 NOTE — Progress Notes (Signed)
This service is provided via telemedicine  No vital signs collected/recorded due to the encounter was a telemedicine visit.   Location of patient (ex: home, work):  [place]  Patient consents to a telephone visit:  Yes, see encounter dated 04/30/2020  Location of the provider (ex: office, home):[employer]  Name of any referring provider:  Hollace Kinnier, DO  Names of all persons participating in the telemedicine service and their role in the encounter: [name], Nurse Practitioner, Carroll Kinds, CMA, and patient.   Time spent on call: 10 minutes with medical assistant

## 2020-04-30 NOTE — Telephone Encounter (Signed)
[name], [place] are scheduled for a virtual visit with your provider today.    Just as we do with appointments in the office, we must obtain your consent to participate.  Your consent will be active for this visit and any virtual visit you may have with one of our providers in the next 365 days.    If you have a MyChart account, I can also send a copy of this consent to you electronically.  All virtual visits are billed to your insurance company just like a traditional visit in the office.  As this is a virtual visit, video technology does not allow for your provider to perform a traditional examination.  This may limit your provider's ability to fully assess your condition.  If your provider identifies any concerns that need to be evaluated in person or the need to arrange testing such as labs, EKG, etc, we will make arrangements to do so.    Although advances in technology are sophisticated, we cannot ensure that it will always work on either your end or our end.  If the connection with a video visit is poor, we may have to switch to a telephone visit.  With either a video or telephone visit, we are not always able to ensure that we have a secure connection.   I need to obtain your verbal consent now.   Are you willing to proceed with your visit today?   [name] has provided verbal consent on 04/30/2020 for a virtual visit (video or telephone).   Carroll Kinds, CMA 04/30/2020  1:39 PM

## 2020-04-30 NOTE — Progress Notes (Signed)
Subjective:   [name] is a 85 y.o. female who presents for Medicare Annual (Subsequent) preventive examination.  Review of Systems     Cardiac Risk Factors include: advanced age (>24men, >73 women);hypertension     Objective:    There were no vitals filed for this visit. There is no height or weight on file to calculate BMI.  Advanced Directives 01/18/2020 09/14/2019 05/04/2019 04/17/2019 01/02/2019 11/24/2018 09/12/2018  Does Patient Have a Medical Advance Directive? Yes Yes Yes Yes Yes Yes Yes  Type of Advance Directive Out of facility DNR (pink MOST or yellow form) Out of facility DNR (pink MOST or yellow form) Out of facility DNR (pink MOST or yellow form) Out of facility DNR (pink MOST or yellow form) [place];Living will [place];Living will [place];Living will  Does patient want to make changes to medical advance directive? No - Patient declined No - Patient declined No - Patient declined No - Patient declined No - Patient declined No - Patient declined No - Patient declined  Copy of [place] in Chart? - - - - Yes - validated most recent copy scanned in chart (See row information) Yes - validated most recent copy scanned in chart (See row information) No - copy requested  Would patient like information on creating a medical advance directive? - - - - - - -  Pre-existing out of facility DNR order (yellow form or pink MOST form) - - Yellow form placed in chart (order not valid for inpatient use) - - - -    Current Medications (verified) Outpatient Encounter Medications as of 04/30/2020  Medication Sig  . BYSTOLIC 10 MG tablet TAKE 1/2 TABLET(5 MG) BY MOUTH DAILY  . CRANBERRY PO Take by mouth.  . EXFORGE 10-320 MG tablet TAKE 1 TABLET BY MOUTH EVERY DAY  . gabapentin (NEURONTIN) 300 MG capsule TAKE 1 CAPSULE(300 MG) BY MOUTH AT BEDTIME  . ketoconazole (NIZORAL) 2 % shampoo Apply 1 application topically 2  (two) times a week.  Marland Kitchen LORazepam (ATIVAN) 0.5 MG tablet TAKE 1 TABLET(0.5 MG) BY MOUTH TWICE DAILY  . MYRBETRIQ 50 MG TB24 tablet Take 50 mg by mouth daily.   No facility-administered encounter medications on file as of 04/30/2020.    Allergies (verified) Cymbalta [duloxetine hcl]   History: Past Medical History:  Diagnosis Date  . Age-related macular degeneration, dry, both eyes   . Alcoholism ([place])   . Anemia   . Anxiety   . Cholelithiasis   . Endometriosis   . Goiter   . Herpes simplex   . Hyperlipidemia   . Hypertension   . Loss of weight   . Nontoxic multinodular goiter   . Other abnormal blood chemistry   . PUD (peptic ulcer disease)   . Renal cyst   . Sciatica   . Scoliosis (and kyphoscoliosis), idiopathic   . Unspecified disorder of kidney and ureter   . Urinary frequency    Past Surgical History:  Procedure Laterality Date  . ABDOMINAL HYSTERECTOMY  1971  . CATARACT EXTRACTION W/ INTRAOCULAR LENS  IMPLANT, BILATERAL Bilateral 2016   Dr. Rutherford Guys  . IRRIGATION AND DEBRIDEMENT ABSCESS Left 02/23/2017   Procedure: IRRIGATION AND DEBRIDEMENT ABSCESS;  Surgeon: Mcarthur Rossetti, MD;  Location: [facility];  Service: Orthopedics;  Laterality: Left;  . REPAIR OF PERFORATED ULCER  1973 &1975   Dr Vida Rigger  . STOMACH SURGERY  [phone]   removed 1/2 stomach; "had  a total of 5 ORs from 1973-1980; all related to perforated ulcer"  . TONSILLECTOMY     Family History  Problem Relation Age of Onset  . Obesity Mother   . Neuropathy Neg Hx    Social History   Socioeconomic History  . Marital status: Divorced    Spouse name: Not on file  . Number of children: 0  . Years of education: Not on file  . Highest education level: Not on file  Occupational History  . Occupation: retired  Tobacco Use  . Smoking status: Former Smoker    Packs/day: 1.00    Years: 40.00    Pack years: 40.00    Types: Cigarettes    Quit date: 01/09/1994    Years since quitting: 26.3  .  Smokeless tobacco: Never Used  Vaping Use  . Vaping Use: Never used  Substance and Sexual Activity  . Alcohol use: No    Comment: 02/25/2017 "recovering alcoholic [id number]"  . Drug use: Never  . Sexual activity: Not Currently  Other Topics Concern  . Not on file  Social History Narrative   Lives alone at home   Caffeine: 1 large cup each morning    Social Determinants of Health   Financial Resource Strain: Not on file  Food Insecurity: Not on file  Transportation Needs: Not on file  Physical Activity: Not on file  Stress: Not on file  Social Connections: Not on file    Tobacco Counseling Counseling given: Not Answered   Clinical Intake:  Pre-visit preparation completed: Yes  Pain : No/denies pain     BMI - recorded: 19 Nutritional Status: BMI of 19-24  Normal Nutritional Risks: None Diabetes: No  How often do you need to have someone help you when you read instructions, pamphlets, or other written materials from your doctor or pharmacy?: 1 - Never  Diabetic?no         Activities of Daily Living In your present state of health, do you have any difficulty performing the following activities: 04/30/2020  Hearing? N  Vision? N  Difficulty concentrating or making decisions? N  Walking or climbing stairs? N  Dressing or bathing? N  Doing errands, shopping? N  Preparing Food and eating ? N  Using the Toilet? N  In the past six months, have you accidently leaked urine? Y  Do you have problems with loss of bowel control? N  Managing your Medications? N  Managing your Finances? N  Housekeeping or managing your Housekeeping? N  Some recent data might be hidden    Patient Care Team: Gayland Curry, DO as PCP - General (Geriatric Medicine) Rutherford Guys, MD as Consulting Physician (Ophthalmology) Vania Rea, MD as Consulting Physician (Obstetrics and Gynecology) Lavonna Monarch, MD as Consulting Physician (Dermatology)  Indicate any recent Medical  Services you may have received from other than [facility] providers in the past year (date may be approximate).     Assessment:   This is a routine wellness examination for [name].  Hearing/Vision screen  Hearing Screening   125Hz  250Hz  500Hz  1000Hz  2000Hz  3000Hz  4000Hz  6000Hz  8000Hz   Right ear:           Left ear:           Comments: Patient has no hearing problems  Vision Screening Comments: Patient wears glasses. Patient has macular degeneration. Patient sees Dr. Gershon Crane  Dietary issues and exercise activities discussed: Current Exercise Habits: Home exercise routine;Structured exercise class, Type of exercise: walking, Time (Minutes): 30, Frequency (  Times/Week): 5, Weekly Exercise (Minutes/Week): 150  Goals   None    Depression Screen PHQ 2/9 Scores 04/30/2020 01/18/2020 09/14/2019 05/04/2019 04/17/2019 01/02/2019 07/04/2018  PHQ - 2 Score 0 0 - 0 0 0 0  Exception Documentation - - Other- indicate reason in comment box - - - -    Fall Risk Fall Risk  04/30/2020 01/18/2020 09/14/2019 05/04/2019 04/17/2019  Falls in the past year? 0 0 0 0 0  Number falls in past yr: 0 0 0 0 0  Injury with Fall? 0 0 0 0 0  Follow up - - - - -    FALL RISK PREVENTION PERTAINING TO THE HOME:  Any stairs in or around the home? No  If so, are there any without handrails? No  Home free of loose throw rugs in walkways, pet beds, electrical cords, etc? Yes  Adequate lighting in your home to reduce risk of falls? Yes   ASSISTIVE DEVICES UTILIZED TO PREVENT FALLS:  Life alert? No  Use of a cane, walker or w/c? No  Grab bars in the bathroom? Yes  Shower chair or bench in shower? No  Elevated toilet seat or a handicapped toilet? Yes   TIMED UP AND GO:  Was the test performed? No .   Cognitive Function: MMSE - Mini Mental State Exam 08/25/2017 07/06/2016 07/01/2015 04/09/2014  Orientation to time 5 5 5 5   Orientation to Place 5 5 5 5   Registration 3 3 3 3   Attention/ Calculation 5 5 5 5   Recall 3 3 2 2    Language- name 2 objects 2 2 2 2   Language- repeat 1 1 1 1   Language- follow 3 step command 3 3 3 3   Language- read & follow direction 1 1 1 1   Write a sentence 1 1 1 1   Copy design 1 1 1 1   Total score 30 30 29 29      6CIT Screen 04/30/2020  What Year? 0 points  What month? 0 points  What time? 0 points  Count back from 20 0 points  Months in reverse 0 points  Repeat phrase 0 points  Total Score 0    Immunizations Immunization History  Administered Date(s) Administered  . PFIZER(Purple Top)SARS-COV-2 Vaccination 03/30/2019, 04/24/2019, 02/05/2020    TDAP status: Due, Education has been provided regarding the importance of this vaccine. Advised may receive this vaccine at local pharmacy or Health Dept. Aware to provide a copy of the vaccination record if obtained from local pharmacy or Health Dept. Verbalized acceptance and understanding.  Flu Vaccine status: Declined, Education has been provided regarding the importance of this vaccine but patient still declined. Advised may receive this vaccine at local pharmacy or Health Dept. Aware to provide a copy of the vaccination record if obtained from local pharmacy or Health Dept. Verbalized acceptance and understanding.  Pneumococcal vaccine status: Declined,  Education has been provided regarding the importance of this vaccine but patient still declined. Advised may receive this vaccine at local pharmacy or Health Dept. Aware to provide a copy of the vaccination record if obtained from local pharmacy or Health Dept. Verbalized acceptance and understanding.   Covid-19 vaccine status: Completed vaccines  Qualifies for Shingles Vaccine? Yes   Zostavax completed No   Shingrix Completed?: No.    Education has been provided regarding the importance of this vaccine. Patient has been advised to call insurance company to determine out of pocket expense if they have not yet received this vaccine. Advised may also  receive vaccine at local  pharmacy or Health Dept. Verbalized acceptance and understanding.  Screening Tests Health Maintenance  Topic Date Due  . OPHTHALMOLOGY EXAM  07/17/2017  . HEMOGLOBIN A1C  03/16/2020  . FOOT EXAM  09/13/2020  . COVID-19 Vaccine  Completed  . HPV VACCINES  Aged Out  . MAMMOGRAM  Discontinued    Health Maintenance  Health Maintenance Due  Topic Date Due  . OPHTHALMOLOGY EXAM  07/17/2017  . HEMOGLOBIN A1C  03/16/2020    Colorectal cancer screening: No longer required.   Mammogram status: No longer required due to advance age.  Declines bone density  Lung Cancer Screening: (Low Dose CT Chest recommended if Age 46-80 years, 30 pack-year currently smoking OR have quit w/in 15years.) does not qualify.    Additional Screening:  Hepatitis C Screening: does not qualify;   Vision Screening: Recommended annual ophthalmology exams for early detection of glaucoma and other disorders of the eye. Is the patient up to date with their annual eye exam?  Yes  Who is the provider or what is the name of the office in which the patient attends annual eye exams? Dr Gershon Crane  If pt is not established with a provider, would they like to be referred to a provider to establish care? No .   Dental Screening: Recommended annual dental exams for proper oral hygiene  Community Resource Referral / Chronic Care Management: CRR required this visit?  No   CCM required this visit?  No      Plan:     I have personally reviewed and noted the following in the patient's chart:   . Medical and social history . Use of alcohol, tobacco or illicit drugs  . Current medications and supplements . Functional ability and status . Nutritional status . Physical activity . Advanced directives . List of other physicians . Hospitalizations, surgeries, and ER visits in previous 12 months . Vitals . Screenings to include cognitive, depression, and falls . Referrals and appointments  In addition, I have  reviewed and discussed with patient certain preventive protocols, quality metrics, and best practice recommendations. A written personalized care plan for preventive services as well as general preventive health recommendations were provided to patient.     Lauree Chandler, NP   04/30/2020    Virtual Visit via Telephone Note  I connected with@ on 04/30/20 at  1:30 PM EDT by telephone and verified that I am speaking with the correct person using two identifiers.  Location: Patient: home Provider: [facility]   I discussed the limitations, risks, security and privacy concerns of performing an evaluation and management service by telephone and the availability of in person appointments. I also discussed with the patient that there may be a patient responsible charge related to this service. The patient expressed understanding and agreed to proceed.   I discussed the assessment and treatment plan with the patient. The patient was provided an opportunity to ask questions and all were answered. The patient agreed with the plan and demonstrated an understanding of the instructions.   The patient was advised to call back or seek an in-person evaluation if the symptoms worsen or if the condition fails to improve as anticipated.  I provided 18 minutes of non-face-to-face time during this encounter.  Carlos American. Harle Battiest Avs printed and mailed

## 2020-05-01 ENCOUNTER — Telehealth: Payer: Self-pay | Admitting: Internal Medicine

## 2020-05-01 NOTE — Telephone Encounter (Signed)
Patient cancelled awv appt on mychart

## 2020-05-10 ENCOUNTER — Telehealth: Payer: Self-pay

## 2020-05-10 ENCOUNTER — Other Ambulatory Visit: Payer: Self-pay | Admitting: Dermatology

## 2020-05-10 ENCOUNTER — Other Ambulatory Visit: Payer: Self-pay

## 2020-05-10 MED ORDER — AMLODIPINE-VALSARTAN-HCTZ 10-320-25 MG PO TABS: 1.0000 | ORAL_TABLET | Freq: Every day | ORAL | 0 refills | Status: DC

## 2020-05-10 MED ORDER — AMLODIPINE-VALSARTAN-HCTZ 10-320-25 MG PO TABS
1.0000 | ORAL_TABLET | Freq: Every day | ORAL | 0 refills | Status: DC
Start: 2020-05-10 — End: 2020-07-08

## 2020-05-10 NOTE — Telephone Encounter (Signed)
May substitute exforge for generic medication.

## 2020-05-10 NOTE — Telephone Encounter (Signed)
[facility] pharmacy called about patient medication refill for Esxorge. Prescription was written for dispense as written,which is on back order right now. Patient is out of medication. Pharmacy spoke with patient and she is ok with trying generic. Is generic ok to give. Please advise.   Message routed to Marlowe Sax, NP

## 2020-05-11 ENCOUNTER — Other Ambulatory Visit: Payer: Self-pay | Admitting: Dermatology

## 2020-06-03 ENCOUNTER — Other Ambulatory Visit: Payer: Self-pay

## 2020-06-03 ENCOUNTER — Ambulatory Visit (INDEPENDENT_AMBULATORY_CARE_PROVIDER_SITE_OTHER): Payer: Medicare Other | Admitting: Family

## 2020-06-03 ENCOUNTER — Encounter: Payer: Self-pay | Admitting: Family

## 2020-06-03 VITALS — BP 128/70 | HR 69 | Temp 97.1°F | Resp 16 | Ht 64.0 in | Wt 111.8 lb

## 2020-06-03 DIAGNOSIS — L22 Diaper dermatitis: Secondary | ICD-10-CM | POA: Diagnosis not present

## 2020-06-03 MED ORDER — BALMEX 11.3 % EX CREA: 1.0000 "application " | TOPICAL_CREAM | Freq: Two times a day (BID) | CUTANEOUS | 1 refills | Status: DC

## 2020-06-03 NOTE — Patient Instructions (Addendum)
_ cleanse peri-area after toilet use with wet wipes,pat dry and apply generous amount of Balmex - change pull up frequently if soiled -Notify provider if symptoms worsen or not improved  Zinc Oxide cream, ointment, paste What is this medicine? ZINC OXIDE (zingk OX ide) is used to treat or prevent minor skin irritations such as burns, cuts, and diaper rash. Some products may be used as a sunscreen. This medicine may be used for other purposes; ask your health care provider or pharmacist if you have questions. COMMON BRAND NAME(S): Aquaphor, Balmex, B[place]aux Butt Paste, Carlesta, COZIMA, Desitin, Desitin Maximum Strength, Desitin Rapid Relief, Diaper Rash, Dr. Thompson Caul, DynaShield, [place] Buttocks, Medi-Paste, Novana Protect, Triple Paste, Z-Bum What should I tell my health care provider before I take this medicine? They need to know if you have any of these conditions:  an unusual or allergic reaction to zinc oxide, other medicines, foods, dyes, or preservatives  pregnant or trying to get pregnant  breast-feeding How should I use this medicine? This medicine is for external use only. Do not take by mouth. Follow the directions on the prescription or product label. Wash your hands before and after use. Apply a generous amount to the affected area. Do not cover with a bandage or dressing unless your doctor or health care professional tells you to. Do not get this medicine in your eyes. If you do, rinse out with plenty of cool tap water. Talk to your pediatrician regarding the use of this medicine in children. While this drug may be prescribed for selected conditions, precautions do apply. Overdosage: If you think you have taken too much of this medicine contact a poison control center or emergency room at once. NOTE: This medicine is only for you. Do not share this medicine with others. What if I miss a dose? If you miss a dose, use it as soon as you can. If it is almost time for your next  dose, use only that dose. Do not use double or extra doses. What may interact with this medicine? Interactions are not expected. Do not use other skin products at the same site without asking your doctor or health care professional. This list may not describe all possible interactions. Give your health care provider a list of all the medicines, herbs, non-prescription drugs, or dietary supplements you use. Also tell them if you smoke, drink alcohol, or use illegal drugs. Some items may interact with your medicine. What should I watch for while using this medicine? Tell your doctor or health care professional if the area you are treating does not get better within a week. What side effects may I notice from receiving this medicine? Side effects that you should report to your doctor or health care professional as soon as possible:  allergic reactions like skin rash, itching or hives, swelling of the face, lips, or tongue This list may not describe all possible side effects. Call your doctor for medical advice about side effects. You may report side effects to FDA at 1-800-FDA-1088. Where should I keep my medicine? Keep out of reach of children. Store at room temperature. Keep closed while not in use. Throw away an unused medicine after the expiration date. NOTE: This sheet is a summary. It may not cover all possible information. If you have questions about this medicine, talk to your doctor, pharmacist, or health care provider.  2021 Elsevier/Gold Standard (2015-03-07 11:23:14)

## 2020-06-03 NOTE — Progress Notes (Signed)
Provider: Sena Clouatre FNP-C  Lauree Chandler, NP  Patient Care Team: Lauree Chandler, NP as PCP - General (Geriatric Medicine) Rutherford Guys, MD as Consulting Physician (Ophthalmology) Vania Rea, MD as Consulting Physician (Obstetrics and Gynecology) Lavonna Monarch, MD as Consulting Physician (Dermatology)  Extended Emergency Contact Information Primary Emergency Contact: [place] Phone: [phone] Relation: Friend Secondary Emergency Contact: Jeanella Flattery [place] Phone: [phone] Relation: Significant other  Code Status:  Full Code  Goals of care: Advanced Directive information Advanced Directives 06/03/2020  Does Patient Have a Medical Advance Directive? Yes  Type of Paramedic of [place];Living will;Out of facility DNR (pink MOST or yellow form)  Does patient want to make changes to medical advance directive? No - Patient declined  Copy of [place] in Chart? No - copy requested  Would patient like information on creating a medical advance directive? No - Patient declined  Pre-existing out of facility DNR order (yellow form or pink MOST form) -     Chief Complaint  Patient presents with  . Acute Visit    Rash in groin area.     HPI:  Pt is a 85 y.o. female seen today for an acute visit for evaluation of rash in groin area for several days.Described as sore and itchy.wears incontinent pull ups.Has applied several creams recently start on Balmex cream feels thick and messy on the hands.Just wants to make sure it's not something worsen like herpes. Rash located on inner thigh/top of vulva areas and between buttock. Has had no fever or chills and no drainage.     Past Medical History:  Diagnosis Date  . Age-related macular degeneration, dry, both eyes   . Alcoholism ([place])   . Anemia   . Anxiety   . Cholelithiasis   . Endometriosis   . Goiter   . Herpes simplex   .  Hyperlipidemia   . Hypertension   . Loss of weight   . Nontoxic multinodular goiter   . Other abnormal blood chemistry   . PUD (peptic ulcer disease)   . Renal cyst   . Sciatica   . Scoliosis (and kyphoscoliosis), idiopathic   . Unspecified disorder of kidney and ureter   . Urinary frequency    Past Surgical History:  Procedure Laterality Date  . ABDOMINAL HYSTERECTOMY  1971  . CATARACT EXTRACTION W/ INTRAOCULAR LENS  IMPLANT, BILATERAL Bilateral 2016   Dr. Rutherford Guys  . IRRIGATION AND DEBRIDEMENT ABSCESS Left 02/23/2017   Procedure: IRRIGATION AND DEBRIDEMENT ABSCESS;  Surgeon: Mcarthur Rossetti, MD;  Location: [facility];  Service: Orthopedics;  Laterality: Left;  . REPAIR OF PERFORATED ULCER  1973 &1975   Dr Vida Rigger  . STOMACH SURGERY  [phone]   removed 1/2 stomach; "had a total of 5 ORs from 1973-1980; all related to perforated ulcer"  . TONSILLECTOMY      Allergies  Allergen Reactions  . Cymbalta [Duloxetine Hcl] Diarrhea    Outpatient Encounter Medications as of 06/03/2020  Medication Sig  . amLODIPine-Valsartan-HCTZ 10-320-25 MG TABS Take 1 tablet by mouth daily.  Marland Kitchen BYSTOLIC 10 MG tablet TAKE 1/2 TABLET(5 MG) BY MOUTH DAILY  . CRANBERRY PO Take by mouth.  . EXFORGE 10-320 MG tablet TAKE 1 TABLET BY MOUTH EVERY DAY  . gabapentin (NEURONTIN) 300 MG capsule TAKE 1 CAPSULE(300 MG) BY MOUTH AT BEDTIME  . ketoconazole (NIZORAL) 2 % shampoo Apply 1 application topically 2 (two) times a week.  Marland Kitchen LORazepam (  ATIVAN) 0.5 MG tablet TAKE 1 TABLET(0.5 MG) BY MOUTH TWICE DAILY  . MYRBETRIQ 50 MG TB24 tablet Take 50 mg by mouth daily.   No facility-administered encounter medications on file as of 06/03/2020.    Review of Systems  Constitutional: Negative for appetite change, chills, fatigue and fever.  Respiratory: Negative for cough, chest tightness, shortness of breath and wheezing.   Cardiovascular: Negative for chest pain, palpitations and leg swelling.  Gastrointestinal:  Negative for abdominal distention, abdominal pain, diarrhea, nausea and vomiting.  Genitourinary: Negative for difficulty urinating, dysuria and flank pain.       Incontinent   Skin: Positive for color change and rash. Negative for pallor and wound.    Immunization History  Administered Date(s) Administered  . PFIZER(Purple Top)SARS-COV-2 Vaccination 03/30/2019, 04/24/2019, 02/05/2020   Pertinent  Health Maintenance Due  Topic Date Due  . OPHTHALMOLOGY EXAM  07/17/2017  . HEMOGLOBIN A1C  03/16/2020  . FOOT EXAM  09/13/2020  . MAMMOGRAM  Discontinued   Fall Risk  06/03/2020 04/30/2020 01/18/2020 09/14/2019 05/04/2019  Falls in the past year? 0 0 0 0 0  Number falls in past yr: 0 0 0 0 0  Injury with Fall? 0 0 0 0 0  Follow up - - - - -   Functional Status Survey:    Vitals:   06/03/20 1129  BP: 128/70  Pulse: 69  Resp: 16  Temp: (!) 97.1 F (36.2 C)  SpO2: 98%  Weight: 111 lb 12.8 oz (50.7 kg)  Height: 5\' 4"  (1.626 m)   Body mass index is 19.19 kg/m. Physical Exam Vitals reviewed.  Constitutional:      General: She is not in acute distress.    Appearance: She is normal weight. She is not ill-appearing.  Eyes:     General: No scleral icterus.       Right eye: No discharge.        Left eye: No discharge.     Conjunctiva/sclera: Conjunctivae normal.     Pupils: Pupils are equal, round, and reactive to light.  Cardiovascular:     Rate and Rhythm: Normal rate and regular rhythm.     Pulses: Normal pulses.     Heart sounds: Normal heart sounds. No murmur heard. No friction rub. No gallop.   Pulmonary:     Effort: Pulmonary effort is normal. No respiratory distress.     Breath sounds: Normal breath sounds. No wheezing, rhonchi or rales.  Chest:     Chest wall: No tenderness.  Abdominal:     General: Bowel sounds are normal. There is no distension.     Palpations: Abdomen is soft. There is no mass.     Tenderness: There is no abdominal tenderness. There is no right  CVA tenderness, left CVA tenderness, guarding or rebound.  Skin:    General: Skin is warm and dry.     Coloration: Skin is not pale.     Findings: No bruising or rash.     Comments: Skin irritation to inner inner thigh/groin,vulva and gluteal folds slight  tender to touch.No rash or drainage noted.   Neurological:     Mental Status: She is alert and oriented to person, place, and time.     Cranial Nerves: No cranial nerve deficit.     Motor: No weakness.     Gait: Gait normal.     Labs reviewed: Recent Labs    09/14/19 1404  NA 134*  K 4.3  CL 104  CO2  18*  GLUCOSE 100  BUN 20  CREATININE 1.52*  CALCIUM 9.5   Recent Labs    09/14/19 1404  AST 20  ALT 11  BILITOT 0.3  PROT 7.5   Recent Labs    09/14/19 1404  WBC 7.5  NEUTROABS 4,358  HGB 9.3*  HCT 30.2*  MCV 78.6*  PLT 328   Lab Results  Component Value Date   TSH 0.64 07/01/2017   Lab Results  Component Value Date   HGBA1C 5.9 (H) 09/14/2019   Lab Results  Component Value Date   CHOL 220 (H) 06/29/2018   HDL 134 06/29/2018   LDLCALC 69 06/29/2018   TRIG 90 06/29/2018   CHOLHDL 1.6 06/29/2018    Significant Diagnostic Results in last 30 days:  No results found.  Assessment/Plan  Diaper rash Afebrile. Skin irritation to inner inner thigh/groin,vulva and gluteal folds slight  tender to touch.No rash or drainage noted.  - Advised to cleanse peri-area after toilet use with wet wipes,pat dry and apply generous amount of Balmex may mix with AD ointment to soften cream. - Also advised to change pull up frequently if soiled - Notify provider if symptoms worsen or not improved.will add Nystatin cream if still no improvement to cover for candidiasis.  - reassured today no signs of Herpes simplex - zinc oxide (BALMEX) 11.3 % CREA cream; Apply 1 application topically 2 (two) times daily.  Dispense: 113 g; Refill: 1  Family/ staff Communication: Reviewed plan of care with patient verbalized   Labs/tests  ordered: None   Next Appointment: As needed if symptoms worsen or fail to improve    Sandrea Hughs, NP

## 2020-06-21 ENCOUNTER — Other Ambulatory Visit: Payer: Self-pay

## 2020-06-21 DIAGNOSIS — I10 Essential (primary) hypertension: Secondary | ICD-10-CM

## 2020-06-21 MED ORDER — NEBIVOLOL HCL 10 MG PO TABS: ORAL_TABLET | ORAL | 1 refills | Status: DC

## 2020-06-27 ENCOUNTER — Ambulatory Visit (INDEPENDENT_AMBULATORY_CARE_PROVIDER_SITE_OTHER): Payer: Medicare Other | Admitting: Orthopedic Surgery

## 2020-06-27 ENCOUNTER — Encounter: Payer: Self-pay | Admitting: Orthopedic Surgery

## 2020-06-27 ENCOUNTER — Other Ambulatory Visit: Payer: Self-pay

## 2020-06-27 VITALS — BP 144/80 | HR 72 | Temp 97.7°F | Resp 16 | Ht 64.0 in | Wt 112.0 lb

## 2020-06-27 DIAGNOSIS — R350 Frequency of micturition: Secondary | ICD-10-CM | POA: Diagnosis not present

## 2020-06-27 DIAGNOSIS — N393 Stress incontinence (female) (male): Secondary | ICD-10-CM | POA: Diagnosis not present

## 2020-06-27 DIAGNOSIS — N898 Other specified noninflammatory disorders of vagina: Secondary | ICD-10-CM | POA: Diagnosis not present

## 2020-06-27 DIAGNOSIS — R399 Unspecified symptoms and signs involving the genitourinary system: Secondary | ICD-10-CM

## 2020-06-27 LAB — POCT URINALYSIS DIPSTICK
Glucose, UA: NEGATIVE
Nitrite, UA: POSITIVE
Protein, UA: NEGATIVE
Spec Grav, UA: 1.015 (ref 1.010–1.025)
Urobilinogen, UA: NEGATIVE E.U./dL — AB
pH, UA: 5 (ref 5.0–8.0)

## 2020-06-27 NOTE — Progress Notes (Signed)
Careteam: Patient Care Team: Lauree Chandler, NP as PCP - General (Geriatric Medicine) Rutherford Guys, MD as Consulting Physician (Ophthalmology) Vania Rea, MD as Consulting Physician (Obstetrics and Gynecology) Lavonna Monarch, MD as Consulting Physician (Dermatology)  Seen by: [name], AGNP-C  PLACE OF SERVICE:  [place] Directive information Does Patient Have a Medical Advance Directive?: Yes, Type of Advance Directive: [place];Living will, Does patient want to make changes to medical advance directive?: No - Patient declined  Allergies  Allergen Reactions  . Cymbalta [Duloxetine Hcl] Diarrhea    Chief Complaint  Patient presents with  . Acute Visit    Complains of possible UTI     HPI: Patient is a 85 y.o. female seen today for acute visit for urinary frequency and dysuria.   Urinary symptoms began 3-4 days ago. She started daily cranberry pills and began to hydrate more. She also took one AZO yesterday. Denies fever and flank pain.   She is very frustrated with her ongoing genitourinary issues. Began wearing a brief pull- up about 6 months ago due to stress incontinence. Reports the brief causes a lot of irritation in her peri-area. Irritation is described as dryness and intermittent burning. Tried estrace in the past without success. She did contact gynecology recently and was given triamcinolone cream. Tried for 2 days and stopped. In addition, she uses KY jelly lubricant daily for dryness. Also uses barrier cream due to irritation over groin and buttocks.   In the evening, she will take a bath and wash off creams. Does not use soap, just warm water. Does not wear a brief at night  Followed by PT for pelvic floor strengthening. Scheduled to see urology at the end of this month.    Review of Systems:  Review of Systems  Constitutional: Negative for chills, fever, malaise/fatigue and weight loss.  Respiratory: Negative for  cough, shortness of breath and wheezing.   Cardiovascular: Negative for chest pain and leg swelling.  Genitourinary: Positive for dysuria and frequency.       Incontinence  Psychiatric/Behavioral: Negative for depression. The patient is not nervous/anxious.     Past Medical History:  Diagnosis Date  . Age-related macular degeneration, dry, both eyes   . Alcoholism ([place])   . Anemia   . Anxiety   . Cholelithiasis   . Endometriosis   . Goiter   . Herpes simplex   . Hyperlipidemia   . Hypertension   . Loss of weight   . Nontoxic multinodular goiter   . Other abnormal blood chemistry   . PUD (peptic ulcer disease)   . Renal cyst   . Sciatica   . Scoliosis (and kyphoscoliosis), idiopathic   . Unspecified disorder of kidney and ureter   . Urinary frequency    Past Surgical History:  Procedure Laterality Date  . ABDOMINAL HYSTERECTOMY  1971  . CATARACT EXTRACTION W/ INTRAOCULAR LENS  IMPLANT, BILATERAL Bilateral 2016   Dr. Rutherford Guys  . IRRIGATION AND DEBRIDEMENT ABSCESS Left 02/23/2017   Procedure: IRRIGATION AND DEBRIDEMENT ABSCESS;  Surgeon: Mcarthur Rossetti, MD;  Location: [facility];  Service: Orthopedics;  Laterality: Left;  . REPAIR OF PERFORATED ULCER  1973 &1975   Dr Vida Rigger  . STOMACH SURGERY  [phone]   removed 1/2 stomach; "had a total of 5 ORs from 1973-1980; all related to perforated ulcer"  . TONSILLECTOMY     Social History:   reports that she quit smoking about 26 years ago.  Her smoking use included cigarettes. She has a 40.00 pack-year smoking history. She has never used smokeless tobacco. She reports that she does not drink alcohol and does not use drugs.  Family History  Problem Relation Age of Onset  . Obesity Mother   . Neuropathy Neg Hx     Medications: Patient's Medications  New Prescriptions   No medications on file  Previous Medications   AMLODIPINE-VALSARTAN-HCTZ 10-320-25 MG TABS    Take 1 tablet by mouth daily.   CRANBERRY PO    Take by  mouth.   EXFORGE 10-320 MG TABLET    TAKE 1 TABLET BY MOUTH EVERY DAY   GABAPENTIN (NEURONTIN) 300 MG CAPSULE    TAKE 1 CAPSULE(300 MG) BY MOUTH AT BEDTIME   KETOCONAZOLE (NIZORAL) 2 % SHAMPOO    Apply 1 application topically 2 (two) times a week.   LORAZEPAM (ATIVAN) 0.5 MG TABLET    TAKE 1 TABLET(0.5 MG) BY MOUTH TWICE DAILY   MYRBETRIQ 50 MG TB24 TABLET    Take 50 mg by mouth daily.   NEBIVOLOL (BYSTOLIC) 10 MG TABLET    TAKE 1/2 TABLET(5 MG) BY MOUTH DAILY   ZINC OXIDE (BALMEX) 11.3 % CREA CREAM    Apply 1 application topically 2 (two) times daily.  Modified Medications   No medications on file  Discontinued Medications   No medications on file    Physical Exam:  Vitals:   06/27/20 1006  Height: 5\' 4"  (1.626 m)   Body mass index is 19.19 kg/m. Wt Readings from Last 3 Encounters:  06/03/20 111 lb 12.8 oz (50.7 kg)  04/29/20 112 lb 6.4 oz (51 kg)  01/18/20 108 lb 1.6 oz (49 kg)    Physical Exam Vitals reviewed.  Constitutional:      General: She is not in acute distress. Eyes:     General:        Right eye: No discharge.        Left eye: No discharge.  Cardiovascular:     Rate and Rhythm: Normal rate and regular rhythm.     Pulses: Normal pulses.     Heart sounds: Normal heart sounds. No murmur heard.   Pulmonary:     Effort: Pulmonary effort is normal. No respiratory distress.     Breath sounds: Normal breath sounds. No wheezing.  Abdominal:     General: Bowel sounds are normal. There is no distension.     Palpations: Abdomen is soft.     Tenderness: There is no abdominal tenderness.  Skin:    General: Skin is warm and dry.  Neurological:     Mental Status: She is alert.  Psychiatric:        Mood and Affect: Mood normal.        Behavior: Behavior normal.    Labs reviewed: Basic Metabolic Panel: Recent Labs    09/14/19 1404  NA 134*  K 4.3  CL 104  CO2 18*  GLUCOSE 100  BUN 20  CREATININE 1.52*  CALCIUM 9.5   Liver Function Tests: Recent Labs     09/14/19 1404  AST 20  ALT 11  BILITOT 0.3  PROT 7.5   No results for input(s): LIPASE, AMYLASE in the last 8760 hours. No results for input(s): AMMONIA in the last 8760 hours. CBC: Recent Labs    09/14/19 1404  WBC 7.5  NEUTROABS 4,358  HGB 9.3*  HCT 30.2*  MCV 78.6*  PLT 328   Lipid Panel: No results for input(s):  CHOL, HDL, LDLCALC, TRIG, CHOLHDL, LDLDIRECT in the last 8760 hours. TSH: No results for input(s): TSH in the last 8760 hours. A1C: Lab Results  Component Value Date   HGBA1C 5.9 (H) 09/14/2019     Assessment/Plan 1. Frequency of urination - frequency with dysuria past 3-4 days, afebrile - cont hydration with water - may continue AZO for 3 days  2. Symptoms of urinary tract infection - same as above - POC Urinalysis Dipstick - Urine Culture  3. Vaginal dryness - failed trial of estrace in past - tried triamcinolone - recommend scheduling gynecology f/u  - cont KY jelly use daily - recommend trying another brief brand  4. Stress incontinence - ongoing, using pull up brief - recommend trying another brief brand due to increased symptoms since starting them 6 months ago - she plans to ask PT for brief samples - cont PT for pelvic floor strengthening  Total time: 99213. Greater than 50% of total time spent doing patient education and coordination of care regarding urinary symptoms management and health maintenance.   Next appt: Visit date not found [place] [phone]

## 2020-06-27 NOTE — Patient Instructions (Signed)
Advised to see gynecology if vaginal irritation persists.   Please try different incontinence pads.   Continue AZO x 3 days, HYDRATE, HYDRATE, HYDRATE!  Atrophic Vaginitis  Atrophic vaginitis is a condition in which the tissues that line the vagina become dry and thin. This condition is most common in women who have stopped having regular menstrual periods (are in menopause). This usually starts when a woman is 18 to 83 years old. That is the time when a woman's estrogen levels begin to decrease. Estrogen is a female hormone. It helps to keep the tissues of the vagina moist. It stimulates the vagina to produce a clear fluid that lubricates the vagina for sex. This fluid also protects the vagina from infection. Lack of estrogen can cause the lining of the vagina to get thinner and dryer. The vagina may also shrink in size. It may become less elastic. Atrophic vaginitis tends to get worse over time as a woman's estrogen level drops. What are the causes? This condition is caused by the normal drop in estrogen that happens around the time of menopause. What increases the risk? Certain conditions or situations may lower a woman's estrogen level, leading to a higher risk for atrophic vaginitis. You are more likely to develop this condition if:  You are taking medicines that block estrogen.  You have had your ovaries removed.  You are being treated for cancer with radiation or medicines (chemotherapy).  You have given birth or are breastfeeding.  You are older than age 22.  You smoke. What are the signs or symptoms? Symptoms of this condition include:  Pain, soreness, a feeling of pressure, or bleeding during sex (dyspareunia).  Vaginal burning, irritation, or itching.  Pain or bleeding when a speculum is used in a vaginal exam.  Having burning pain while urinating.  Vaginal discharge. In some cases, there are no symptoms. How is this diagnosed? This condition is diagnosed based on  your medical history and a physical exam. This will include a pelvic exam that checks the vaginal tissues. Though rare, you may also have other tests, including:  A urine test.  A test that checks the acid balance in your vagina (acid balance test). How is this treated? Treatment for this condition depends on how severe your symptoms are. Treatment may include:  Using an over-the-counter vaginal lubricant before sex.  Using a long-acting vaginal moisturizer.  Using low-dose estrogen for moderate to severe symptoms that do not respond to other treatments. Options include creams, tablets, and inserts (vaginal rings). Before you use a vaginal estrogen, tell your health care provider if you have a history of: ? Breast cancer. ? Endometrial cancer. ? Blood clots. If you are not sexually active and your symptoms are very mild, you may not need treatment. Follow these instructions at home: Medicines  Take over-the-counter and prescription medicines only as told by your health care provider.  Do not use herbal or alternative medicines unless your health care provider says that you can.  Use over-the-counter creams, lubricants, or moisturizers for dryness only as told by your health care provider. General instructions  If your atrophic vaginitis is caused by menopause, discuss all of your menopause symptoms and treatment options with your health care provider.  Do not douche.  Do not use products that can make your vagina dry. These include: ? Scented feminine sprays. ? Scented tampons. ? Scented soaps.  Vaginal sex can help to improve blood flow and elasticity of vaginal tissue. If you choose to  have sex and it hurts, try using a water-soluble lubricant or moisturizer right before having sex. Contact a health care provider if:  Your discharge looks different than normal.  Your vagina has an unusual smell.  You have new symptoms.  Your symptoms do not improve with  treatment.  Your symptoms get worse. Summary  Atrophic vaginitis is a condition in which the tissues that line the vagina become dry and thin. It is most common in women who have stopped having regular menstrual periods (are in menopause).  Treatment options include using vaginal lubricants and low-dose vaginal estrogen.  Contact a health care provider if your vagina has an unusual smell, or if your symptoms get worse or do not improve after treatment. This information is not intended to replace advice given to you by your health care provider. Make sure you discuss any questions you have with your health care provider. Document Revised: 08/03/2019 Document Reviewed: 08/03/2019 Elsevier Patient Education  [place].

## 2020-06-29 LAB — URINE CULTURE
MICRO NUMBER:: 11886037
SPECIMEN QUALITY:: ADEQUATE

## 2020-06-30 ENCOUNTER — Other Ambulatory Visit: Payer: Self-pay | Admitting: Orthopedic Surgery

## 2020-06-30 DIAGNOSIS — R350 Frequency of micturition: Secondary | ICD-10-CM

## 2020-06-30 MED ORDER — CIPROFLOXACIN HCL 500 MG PO TABS: 500.0000 mg | ORAL_TABLET | Freq: Two times a day (BID) | ORAL | 0 refills | Status: DC

## 2020-07-01 NOTE — Progress Notes (Signed)
I sent prescription yesterday. I called patient and left message about new prescription.

## 2020-07-04 DIAGNOSIS — B009 Herpesviral infection, unspecified: Secondary | ICD-10-CM | POA: Insufficient documentation

## 2020-07-08 ENCOUNTER — Ambulatory Visit (INDEPENDENT_AMBULATORY_CARE_PROVIDER_SITE_OTHER): Payer: Medicare Other | Admitting: Adult Health

## 2020-07-08 ENCOUNTER — Other Ambulatory Visit: Payer: Self-pay

## 2020-07-08 ENCOUNTER — Encounter: Payer: Self-pay | Admitting: Adult Health

## 2020-07-08 VITALS — BP 124/64 | HR 68 | Temp 97.3°F | Ht 64.0 in | Wt 114.0 lb

## 2020-07-08 DIAGNOSIS — N3000 Acute cystitis without hematuria: Secondary | ICD-10-CM | POA: Diagnosis not present

## 2020-07-08 DIAGNOSIS — K30 Functional dyspepsia: Secondary | ICD-10-CM | POA: Diagnosis not present

## 2020-07-08 NOTE — Patient Instructions (Signed)
Indigestion Indigestion is a feeling of pain, discomfort, burning, or fullness in the upper part of your belly (abdomen). It can come and go. It may happen often or rarely. Indigestion tends to happen while you are eating or right after you have finished eating. It may be worse:  At night.  When bending over.  While lying down. Indigestion may be a symptom of another condition. Follow these instructions at home: Eating and drinking  Follow an eating plan as told by your doctor.  You may need to avoid foods and drinks such as: ? Chocolate and cocoa. ? Peppermint and mint flavorings. ? Garlic and onions. ? Horseradish. ? Spicy and acidic foods, such as:  Peppers.  Chili powder and curry powder.  Vinegar.  Hot sauces and BBQ sauce. ? Citrus fruits, such as:  Oranges.  Lemons.  Limes. ? Tomato-based foods, such as:  Red sauce and pizza with red sauce.  Chili.  Salsa. ? Fried and fatty foods, such as:  Donuts.  French fries and potato chips.  High-fat dressings. ? High-fat meats, such as:  Hot dogs and sausage.  Rib eye steak.  Ham and bacon. ? High-fat dairy items, such as:  Whole milk.  Butter.  Cream cheese. ? Coffee and tea, with or without caffeine. ? Drinks that contain alcohol. ? Energy drinks and sports drinks. ? Carbonated drinks or sodas. ? Citrus fruit juices.  Eat small meals often. Avoid eating large meals.  Avoid drinking large amounts of liquid with your meals.  Avoid eating meals during the 2-3 hours before bedtime.  Avoid lying down right after you eat.  Avoid exercise for 2 hours after you eat.   Lifestyle  Maintain a healthy weight. Ask your doctor what weight is healthy for you. If you need to lose weight, work with your doctor.  Exercise for at least 30 minutes on 5 or more days each week, or as told by your doctor.  Avoid exercises that include bending forward. This can make your symptoms worse.  Wear loose  clothes. Do not wear anything tight around your waist.  Do not smoke or use any products that contain nicotine or tobacco. These can make your symptoms worse. If you need help quitting, ask your doctor.  Raise (elevate) the head of your bed about 6 inches (15 cm) when you sleep. You can use a wedge to do this.  Try to lower your stress. If you need help doing this, ask your doctor.      General instructions  Take over-the-counter and prescription medicines only as told by your doctor.  Do not take aspirin or NSAIDs, such as ibuprofen, unless your doctor says it is okay.  Watch for any changes in your symptoms.  Keep all follow-up visits. Contact a doctor if:  You have new symptoms.  You lose weight and you do not know why.  You have trouble swallowing, or it hurts to swallow.  Your symptoms do not get better with treatment.  Your symptoms last for more than 2 days.  You have a fever.  You vomit. Get help right away if:  You have pain in your arms, neck, jaw, teeth, or back all of a sudden.  You feel sweaty, dizzy, or light-headed all of a sudden.  You faint.  You have chest pain or shortness of breath.  You cannot stop vomiting, or you vomit blood.  Your poop (stool) is bloody or black.  You have very bad pain in your belly.   These symptoms may be an emergency. Get help right away. Call your local emergency services (911 in the [place]  Do not wait to see if the symptoms will go away.  Do not drive yourself to the hospital. Summary  Indigestion is a feeling of pain, discomfort, burning, or fullness in the upper part of your belly. It tends to happen while you are eating or right after you have finished eating.  Follow an eating plan and other lifestyle changes as told by your doctor.  Take over-the-counter and prescription medicines only as told by your doctor. Do not take aspirin or NSAIDs, such as ibuprofen, unless your doctor says it is okay.  Contact  your doctor if your symptoms do not get better or they get worse.  Some symptoms may represent a serious problem that is an emergency. Do not wait to see if the symptoms will go away. Get medical help right away. This information is not intended to replace advice given to you by your health care provider. Make sure you discuss any questions you have with your health care provider. Document Revised: 08/09/2019 Document Reviewed: 08/09/2019 Elsevier Patient Education  2021 [employer] Inc.  

## 2020-07-08 NOTE — Progress Notes (Signed)
[facility] clinic  Provider:   Durenda Age   DNP  Code Status:  Full Code   Goals of Care:  Advanced Directives 06/27/2020  Does Patient Have a Medical Advance Directive? Yes  Type of Paramedic of [place];Living will  Does patient want to make changes to medical advance directive? No - Patient declined  Copy of [place] in Chart? Yes - validated most recent copy scanned in chart (See row information)  Would patient like information on creating a medical advance directive? -  Pre-existing out of facility DNR order (yellow form or pink MOST form) -     Chief Complaint  Patient presents with  . Acute Visit    Indigestion and nausea she noticed since started taking Cipro.     HPI: Patient is a 85 y.o. female seen today for an acute visit for Indigestion which started when she started taking Cipro. She started taking Cipro 7 days ago for her UTI. She is having acidity after she eats, gets hot and feels like she wants to throw up. She denies any dysuria, fever nor hematuria. She has taken 7 days of Cipro. She wears diaper due to urinary incontinence. She has appointment tomorrow with [facility] for a possible procedure for the urinary incontinence. Drinks a cup of coffee in the morning and a tea at lunch.   Past Medical History:  Diagnosis Date  . Age-related macular degeneration, dry, both eyes   . Alcoholism ([place])   . Anemia   . Anxiety   . Cholelithiasis   . Endometriosis   . Goiter   . Herpes simplex   . Hyperlipidemia   . Hypertension   . Loss of weight   . Nontoxic multinodular goiter   . Other abnormal blood chemistry   . PUD (peptic ulcer disease)   . Renal cyst   . Sciatica   . Scoliosis (and kyphoscoliosis), idiopathic   . Unspecified disorder of kidney and ureter   . Urinary frequency     Past Surgical History:  Procedure Laterality Date  . ABDOMINAL HYSTERECTOMY  1971  . CATARACT EXTRACTION W/  INTRAOCULAR LENS  IMPLANT, BILATERAL Bilateral 2016   Dr. Rutherford Guys  . IRRIGATION AND DEBRIDEMENT ABSCESS Left 02/23/2017   Procedure: IRRIGATION AND DEBRIDEMENT ABSCESS;  Surgeon: Mcarthur Rossetti, MD;  Location: [place];  Service: Orthopedics;  Laterality: Left;  . REPAIR OF PERFORATED ULCER  1973 &1975   Dr Vida Rigger  . STOMACH SURGERY  [phone]   removed 1/2 stomach; "had a total of 5 ORs from 1973-1980; all related to perforated ulcer"  . TONSILLECTOMY      Allergies  Allergen Reactions  . Cymbalta [Duloxetine Hcl] Diarrhea    Outpatient Encounter Medications as of 07/08/2020  Medication Sig  . amLODIPine-Valsartan-HCTZ 10-320-25 MG TABS Take 1 tablet by mouth daily.  . ciprofloxacin (CIPRO) 500 MG tablet Take 1 tablet (500 mg total) by mouth 2 (two) times daily for 10 days.  Marland Kitchen CRANBERRY PO Take by mouth.  . gabapentin (NEURONTIN) 300 MG capsule TAKE 1 CAPSULE(300 MG) BY MOUTH AT BEDTIME  . LORazepam (ATIVAN) 0.5 MG tablet TAKE 1 TABLET(0.5 MG) BY MOUTH TWICE DAILY  . MYRBETRIQ 50 MG TB24 tablet Take 50 mg by mouth daily.  . nebivolol (BYSTOLIC) 10 MG tablet TAKE 1/2 TABLET(5 MG) BY MOUTH DAILY  . EXFORGE 10-320 MG tablet TAKE 1 TABLET BY MOUTH EVERY DAY (Patient not taking: Reported on 07/08/2020)  . ketoconazole (NIZORAL)  2 % shampoo Apply 1 application topically 2 (two) times a week. (Patient not taking: Reported on 07/08/2020)  . zinc oxide (BALMEX) 11.3 % CREA cream Apply 1 application topically 2 (two) times daily. (Patient not taking: Reported on 07/08/2020)   No facility-administered encounter medications on file as of 07/08/2020.    Review of Systems:  Review of Systems  Constitutional: Negative for activity change, appetite change, chills and fever.  HENT: Negative for mouth sores.   Eyes: Negative for discharge and itching.  Respiratory: Negative for cough and shortness of breath.   Cardiovascular: Negative for leg swelling.  Gastrointestinal: Positive for  nausea. Negative for abdominal distention and vomiting.  Genitourinary: Negative for difficulty urinating.  Musculoskeletal: Negative for back pain.  Neurological: Negative for tremors and numbness.  Psychiatric/Behavioral: Negative.     Health Maintenance  Topic Date Due  . OPHTHALMOLOGY EXAM  07/17/2017  . HEMOGLOBIN A1C  03/16/2020  . FOOT EXAM  09/13/2020  . COVID-19 Vaccine  Completed  . HPV VACCINES  Aged Out  . MAMMOGRAM  Discontinued    Physical Exam: Vitals:   07/08/20 1451  BP: 124/64  Pulse: 80  Temp: (!) 97.3 F (36.3 C)  TempSrc: Temporal  SpO2: 97%  Weight: 114 lb (51.7 kg)  Height: 5\' 4"  (1.626 m)   Body mass index is 19.57 kg/m. Physical Exam Constitutional:      Appearance: Normal appearance.  HENT:     Head: Normocephalic.     Left Ear: External ear normal.  Eyes:     Conjunctiva/sclera: Conjunctivae normal.  Cardiovascular:     Rate and Rhythm: Normal rate and regular rhythm.     Pulses: Normal pulses.     Heart sounds: Normal heart sounds.  Pulmonary:     Effort: Pulmonary effort is normal.     Breath sounds: Normal breath sounds.  Abdominal:     General: Abdomen is flat.  Musculoskeletal:     Cervical back: Normal range of motion.  Neurological:     Mental Status: She is alert.     Labs reviewed: Basic Metabolic Panel: Recent Labs    09/14/19 1404  NA 134*  K 4.3  CL 104  CO2 18*  GLUCOSE 100  BUN 20  CREATININE 1.52*  CALCIUM 9.5   Liver Function Tests: Recent Labs    09/14/19 1404  AST 20  ALT 11  BILITOT 0.3  PROT 7.5   CBC: Recent Labs    09/14/19 1404  WBC 7.5  NEUTROABS 4,358  HGB 9.3*  HCT 30.2*  MCV 78.6*  PLT 328    Lab Results  Component Value Date   HGBA1C 5.9 (H) 09/14/2019     Assessment/Plan  1. Indigestion -  Will stop taking her Cipro since she has taken 7 days of it already -   Takes PRN Tums  -    Does not want to start on PPI for now  2.  UTI -    Has taken Cipro X 7 days  already and denies any hematuria nor dysuria -    Will discontinue Cipro    Labs/tests ordered:  None   Next appt:  07/16/2020

## 2020-07-10 ENCOUNTER — Other Ambulatory Visit: Payer: Self-pay

## 2020-07-10 DIAGNOSIS — D508 Other iron deficiency anemias: Secondary | ICD-10-CM

## 2020-07-10 DIAGNOSIS — E042 Nontoxic multinodular goiter: Secondary | ICD-10-CM

## 2020-07-10 DIAGNOSIS — N1832 Chronic kidney disease, stage 3b: Secondary | ICD-10-CM

## 2020-07-10 DIAGNOSIS — R739 Hyperglycemia, unspecified: Secondary | ICD-10-CM

## 2020-07-16 ENCOUNTER — Other Ambulatory Visit: Payer: Self-pay

## 2020-07-16 ENCOUNTER — Other Ambulatory Visit: Payer: Medicare Other

## 2020-07-17 LAB — COMPLETE METABOLIC PANEL WITH GFR
AG Ratio: 1.8 (calc) (ref 1.0–2.5)
ALT: 11 U/L (ref 6–29)
AST: 18 U/L (ref 10–35)
Albumin: 4.3 g/dL (ref 3.6–5.1)
Alkaline phosphatase (APISO): 118 U/L (ref 37–153)
BUN/Creatinine Ratio: 15 (calc) (ref 6–22)
BUN: 21 mg/dL (ref 7–25)
CO2: 22 mmol/L (ref 20–32)
Calcium: 9.5 mg/dL (ref 8.6–10.4)
Chloride: 105 mmol/L (ref 98–110)
Creat: 1.44 mg/dL — ABNORMAL HIGH (ref 0.60–0.88)
GFR, Est African American: 39 mL/min/{1.73_m2} — ABNORMAL LOW (ref >=60)
GFR, Est Non African American: 34 mL/min/{1.73_m2} — ABNORMAL LOW (ref >=60)
Globulin: 2.4 g/dL (calc) (ref 1.9–3.7)
Glucose, Bld: 100 mg/dL — ABNORMAL HIGH (ref 65–99)
Potassium: 5 mmol/L (ref 3.5–5.3)
Sodium: 135 mmol/L (ref 135–146)
Total Bilirubin: 0.3 mg/dL (ref 0.2–1.2)
Total Protein: 6.7 g/dL (ref 6.1–8.1)

## 2020-07-17 LAB — CBC WITH DIFFERENTIAL/PLATELET
Absolute Monocytes: 567 cells/uL (ref 200–950)
Basophils Absolute: 77 cells/uL (ref 0–200)
Basophils Relative: 1.1 %
Eosinophils Absolute: 217 cells/uL (ref 15–500)
Eosinophils Relative: 3.1 %
HCT: 36.6 % (ref 35.0–45.0)
Hemoglobin: 11.9 g/dL (ref 11.7–15.5)
Lymphs Abs: 2030 cells/uL (ref 850–3900)
MCH: 29.2 pg (ref 27.0–33.0)
MCHC: 32.5 g/dL (ref 32.0–36.0)
MCV: 89.7 fL (ref 80.0–100.0)
MPV: 10.2 fL (ref 7.5–12.5)
Monocytes Relative: 8.1 %
Neutro Abs: 4109 cells/uL (ref 1500–7800)
Neutrophils Relative %: 58.7 %
Platelets: 335 10*3/uL (ref 140–400)
RBC: 4.08 10*6/uL (ref 3.80–5.10)
RDW: 12.8 % (ref 11.0–15.0)
Total Lymphocyte: 29 %
WBC: 7 10*3/uL (ref 3.8–10.8)

## 2020-07-17 LAB — TSH: TSH: 0.67 mIU/L (ref 0.40–4.50)

## 2020-07-17 LAB — HEMOGLOBIN A1C
Hgb A1c MFr Bld: 5.7 % of total Hgb — ABNORMAL HIGH (ref <5.7)
Mean Plasma Glucose: 117 mg/dL
eAG (mmol/L): 6.5 mmol/L

## 2020-07-18 ENCOUNTER — Ambulatory Visit: Payer: Medicare Other | Admitting: Internal Medicine

## 2020-07-19 ENCOUNTER — Encounter: Payer: Self-pay | Admitting: Nurse Practitioner

## 2020-07-19 ENCOUNTER — Ambulatory Visit (INDEPENDENT_AMBULATORY_CARE_PROVIDER_SITE_OTHER): Payer: Medicare Other | Admitting: Nurse Practitioner

## 2020-07-19 ENCOUNTER — Telehealth: Payer: Self-pay

## 2020-07-19 ENCOUNTER — Other Ambulatory Visit: Payer: Self-pay

## 2020-07-19 VITALS — BP 126/78 | HR 63 | Temp 97.8°F | Ht 64.0 in | Wt 111.0 lb

## 2020-07-19 DIAGNOSIS — N1832 Chronic kidney disease, stage 3b: Secondary | ICD-10-CM | POA: Diagnosis not present

## 2020-07-19 DIAGNOSIS — N3281 Overactive bladder: Secondary | ICD-10-CM | POA: Diagnosis not present

## 2020-07-19 DIAGNOSIS — M25512 Pain in left shoulder: Secondary | ICD-10-CM

## 2020-07-19 DIAGNOSIS — F419 Anxiety disorder, unspecified: Secondary | ICD-10-CM

## 2020-07-19 DIAGNOSIS — D508 Other iron deficiency anemias: Secondary | ICD-10-CM

## 2020-07-19 DIAGNOSIS — L299 Pruritus, unspecified: Secondary | ICD-10-CM

## 2020-07-19 DIAGNOSIS — I10 Essential (primary) hypertension: Secondary | ICD-10-CM | POA: Diagnosis not present

## 2020-07-19 MED ORDER — PREDNISONE 10 MG (21) PO TBPK: ORAL_TABLET | ORAL | 0 refills | Status: DC

## 2020-07-19 NOTE — Patient Instructions (Addendum)
To use heat to upper back  Ice to shoulder   Make sure you are getting enough water.

## 2020-07-19 NOTE — Progress Notes (Signed)
Careteam: Patient Care Team: Lauree Chandler, NP as PCP - General (Geriatric Medicine) Rutherford Guys, MD as Consulting Physician (Ophthalmology) Vania Rea, MD as Consulting Physician (Obstetrics and Gynecology) Lavonna Monarch, MD as Consulting Physician (Dermatology)  PLACE OF SERVICE:  [place] Directive information Does Patient Have a Medical Advance Directive?: Yes, Type of Advance Directive: [place], Does patient want to make changes to medical advance directive?: No - Patient declined  Allergies  Allergen Reactions  . Cymbalta [Duloxetine Hcl] Diarrhea    Chief Complaint  Patient presents with  . Medical Management of Chronic Issues    6 month follow-up and discuss labs (patient is active on mychart). Discuss need for eye exam (scheduled for 10/2020)     HPI: Patient is a 85 y.o. female for routine follow up.   Recent UTI- completed cipro. Taking cranberry routinely.  Has OAB- taking myrbetriq which is well controlled.  With incontinence and leakage. Does not like wearing the pull up pads- plans to have some testing.   Reports her biggest issues is that her arm and shoulder has been causing her some issues. She had a diagnosised of frozen shoulder in the past. Has used medicated pad but that caused more pain. Used muscle rub. Has a hard time sleeping.   With ongoing itchy scalp- she is now seeing an accupuncturist to help.   DM- A1c well controlled today.   CKD- stable. Avoiding NSAIDS>   Anemia- taking baby iron daily   Review of Systems:  Review of Systems  Constitutional: Negative for chills, fever and weight loss.  HENT: Negative for tinnitus.   Respiratory: Negative for cough, sputum production and shortness of breath.   Cardiovascular: Negative for chest pain, palpitations and leg swelling.  Gastrointestinal: Negative for abdominal pain, constipation, diarrhea and heartburn.  Genitourinary: Positive for frequency.  Negative for dysuria and urgency.  Musculoskeletal: Positive for joint pain and myalgias. Negative for back pain and falls.  Skin: Positive for itching (to scalp).  Neurological: Positive for tingling (to scalp). Negative for dizziness and headaches.  Psychiatric/Behavioral: Negative for depression and memory loss. The patient is nervous/anxious. The patient does not have insomnia.     Past Medical History:  Diagnosis Date  . Age-related macular degeneration, dry, both eyes   . Alcoholism ([place])   . Anemia   . Anxiety   . Cholelithiasis   . Endometriosis   . Goiter   . Herpes simplex   . Hyperlipidemia   . Hypertension   . Loss of weight   . Nontoxic multinodular goiter   . Other abnormal blood chemistry   . PUD (peptic ulcer disease)   . Renal cyst   . Sciatica   . Scoliosis (and kyphoscoliosis), idiopathic   . Unspecified disorder of kidney and ureter   . Urinary frequency    Past Surgical History:  Procedure Laterality Date  . ABDOMINAL HYSTERECTOMY  1971  . CATARACT EXTRACTION W/ INTRAOCULAR LENS  IMPLANT, BILATERAL Bilateral 2016   Dr. Rutherford Guys  . IRRIGATION AND DEBRIDEMENT ABSCESS Left 02/23/2017   Procedure: IRRIGATION AND DEBRIDEMENT ABSCESS;  Surgeon: Mcarthur Rossetti, MD;  Location: [place];  Service: Orthopedics;  Laterality: Left;  . REPAIR OF PERFORATED ULCER  1973 &1975   Dr Vida Rigger  . STOMACH SURGERY  [phone]   removed 1/2 stomach; "had a total of 5 ORs from 1973-1980; all related to perforated ulcer"  . TONSILLECTOMY     Social History:  reports that she quit smoking about 26 years ago. Her smoking use included cigarettes. She has a 40.00 pack-year smoking history. She has never used smokeless tobacco. She reports that she does not drink alcohol and does not use drugs.  Family History  Problem Relation Age of Onset  . Obesity Mother   . Neuropathy Neg Hx     Medications: Patient's Medications  New Prescriptions   No medications on file   Previous Medications   BARRIER CREAM (NON-SPECIFIED) CREA    Apply 1 application topically as needed.   CRANBERRY PO    Take by mouth.   EXFORGE 10-320 MG TABLET    TAKE 1 TABLET BY MOUTH EVERY DAY   GABAPENTIN (NEURONTIN) 300 MG CAPSULE    TAKE 1 CAPSULE(300 MG) BY MOUTH AT BEDTIME   LORAZEPAM (ATIVAN) 0.5 MG TABLET    TAKE 1 TABLET(0.5 MG) BY MOUTH TWICE DAILY   MYRBETRIQ 50 MG TB24 TABLET    Take 50 mg by mouth daily.   NEBIVOLOL (BYSTOLIC) 10 MG TABLET    TAKE 1/2 TABLET(5 MG) BY MOUTH DAILY  Modified Medications   No medications on file  Discontinued Medications   KETOCONAZOLE (NIZORAL) 2 % SHAMPOO    Apply 1 application topically 2 (two) times a week.   ZINC OXIDE (BALMEX) 11.3 % CREA CREAM    Apply 1 application topically 2 (two) times daily.    Physical Exam:  Vitals:   07/19/20 1414  BP: 126/78  Pulse: 63  Temp: 97.8 F (36.6 C)  TempSrc: Temporal  SpO2: 98%  Weight: 111 lb (50.3 kg)  Height: 5\' 4"  (1.626 m)   Body mass index is 19.05 kg/m. Wt Readings from Last 3 Encounters:  07/19/20 111 lb (50.3 kg)  07/08/20 114 lb (51.7 kg)  06/27/20 112 lb (50.8 kg)    Physical Exam Constitutional:      General: She is not in acute distress.    Appearance: She is well-developed. She is not diaphoretic.  HENT:     Head: Normocephalic and atraumatic.     Mouth/Throat:     Pharynx: No oropharyngeal exudate.  Eyes:     Conjunctiva/sclera: Conjunctivae normal.     Pupils: Pupils are equal, round, and reactive to light.  Cardiovascular:     Rate and Rhythm: Normal rate and regular rhythm.     Heart sounds: Normal heart sounds.  Pulmonary:     Effort: Pulmonary effort is normal.     Breath sounds: Normal breath sounds.  Abdominal:     General: Bowel sounds are normal.     Palpations: Abdomen is soft.  Musculoskeletal:        General: Tenderness present.     Right shoulder: Tenderness present. Normal range of motion. Normal strength.     Cervical back: Normal range  of motion and neck supple.       Back:     Comments: Paraspinal tenderness to left upper back.  Skin:    General: Skin is warm and dry.  Neurological:     Mental Status: She is alert and oriented to person, place, and time.     Labs reviewed: Basic Metabolic Panel: Recent Labs    09/14/19 1404 07/16/20 0000  NA 134* 135  K 4.3 5.0  CL 104 105  CO2 18* 22  GLUCOSE 100 100*  BUN 20 21  CREATININE 1.52* 1.44*  CALCIUM 9.5 9.5  TSH  --  0.67   Liver Function Tests: Recent Labs  09/14/19 1404 07/16/20 0000  AST 20 18  ALT 11 11  BILITOT 0.3 0.3  PROT 7.5 6.7   No results for input(s): LIPASE, AMYLASE in the last 8760 hours. No results for input(s): AMMONIA in the last 8760 hours. CBC: Recent Labs    09/14/19 1404 07/16/20 0000  WBC 7.5 7.0  NEUTROABS 4,358 4,109  HGB 9.3* 11.9  HCT 30.2* 36.6  MCV 78.6* 89.7  PLT 328 335   Lipid Panel: No results for input(s): CHOL, HDL, LDLCALC, TRIG, CHOLHDL, LDLDIRECT in the last 8760 hours. TSH: Recent Labs    07/16/20 0000  TSH 0.67   A1C: Lab Results  Component Value Date   HGBA1C 5.7 (H) 07/16/2020     Assessment/Plan 1. Acute pain of left shoulder -question if this is defered pain from upper back. Tenderness noted on exam -pain 7/10 with CKD with increase in pain will send Rx for prednisone and encouraged heat to back.  -educated to follow up if symptoms fail to  - predniSONE (STERAPRED UNI-PAK 21 TAB) 10 MG (21) TBPK tablet; Use as directed  Dispense: 21 tablet; Refill: 0  2. Chronic kidney disease (CKD) stage G3b/A1, moderately decreased glomerular filtration rate (GFR) between 30-44 mL/min/1.73 square meter and albuminuria creatinine ratio less than 30 mg/g (HCC) -Encourage proper hydration and to avoid NSAIDS (Aleve, Advil, Motrin, Ibuprofen)   3. Anxiety -controlled on ativan 0.5 mg twice daily routinely.   4. Itchy scalp -has not found much relief, seeing acupuncturist at this time. Continues  on gabapentin 300 mg by mouth at bedtime to help so she is able to sleep.  5. Iron deficiency anemia secondary to inadequate dietary iron intake -hgb stable. Continues low dose iron supplement   6. Essential hypertension, benign -stable on current regimen. Continue low sodium diet with medication management.   7. Overactive bladder -continues on myrbetriq. Followed by urology.   Next appt: 6 months, labs prior  Keanan Melander K. [place], [facility] [phone]

## 2020-07-19 NOTE — Telephone Encounter (Signed)
I called [facility] to get patients last eye exam and was told by Orvil Feil that I will need to send request via fax to [phone]  Release of information signed by patient and faxed

## 2020-07-25 NOTE — Telephone Encounter (Signed)
Incoming eye exams received and placed in providers review and sign folder. In view of the fact that patient is not diabetic according to her problem list, eye exam not abstracted to document positive or negative for retinopathy

## 2020-08-08 ENCOUNTER — Other Ambulatory Visit: Payer: Self-pay | Admitting: Nurse Practitioner

## 2020-08-08 DIAGNOSIS — I10 Essential (primary) hypertension: Secondary | ICD-10-CM

## 2020-08-08 DIAGNOSIS — R350 Frequency of micturition: Secondary | ICD-10-CM | POA: Diagnosis not present

## 2020-08-14 ENCOUNTER — Other Ambulatory Visit: Payer: Self-pay | Admitting: Dermatology

## 2020-08-15 ENCOUNTER — Other Ambulatory Visit: Payer: Self-pay

## 2020-08-15 DIAGNOSIS — F419 Anxiety disorder, unspecified: Secondary | ICD-10-CM

## 2020-08-15 DIAGNOSIS — R350 Frequency of micturition: Secondary | ICD-10-CM | POA: Diagnosis not present

## 2020-08-15 MED ORDER — LORAZEPAM 0.5 MG PO TABS: ORAL_TABLET | ORAL | 5 refills | Status: DC

## 2020-08-15 NOTE — Telephone Encounter (Signed)
RX last refilled in February 19, 2020 with 5 additional refills   Treatment agreement on file from December 2021

## 2020-09-15 ENCOUNTER — Encounter: Payer: Self-pay | Admitting: Dermatology

## 2020-09-15 NOTE — Progress Notes (Signed)
   Follow-Up Visit   Subjective  [name] is a 85 y.o. female who presents for the following: Follow-up (scalp issue still present some flake and she is taking the gabapenten 300 mg. NEUROLOGY DR wants bx or MRI to see whats going on with the patient).  [facility]  Follow-Up Visit   Subjective  [name] is a 85 y.o. female who presents for the following: Follow-up (scalp issue still present some flake and she is taking the gabapenten 300 mg. NEUROLOGY DR wants bx or MRI to see whats going on with the patient).  Going issue with burning itch/discomfort of scalp Location:  Duration:  Quality:  Associated Signs/Symptoms: Modifying Factors: Gabapentin is not providing adequate relief. Severity:  Timing: Context:   Objective  Well appearing patient in no apparent distress; mood and affect are within normal limits. Mid Parietal Scalp No objective findings on examination.  No inflammation, no vesicles, no fibrosis.  By definition cutaneous dysesthesia is a disorder without objective skin findings.  I am unaware of an MRI or random scalp biopsies producing useful information.    A focused examination was performed including head and neck. Relevant physical exam findings are noted in the Assessment and Plan.   Assessment & Plan    Dysesthesia of scalp Mid Parietal Scalp  I spent considerable time empathizing with sous significant scalp symptoms but I do not have a Plan B strategy if incremental increasing doses of gabapentin proved to be ineffective.  I did suggest she consider allowing me to refer her to Dr. Juliet Rude at [facility] Department of dermatology.      I, Lavonna Monarch, MD, have reviewed all documentation for this visit.  The documentation on 09/15/20 for the exam, diagnosis, procedures, and orders are all accurate and complete.

## 2020-09-19 DIAGNOSIS — R350 Frequency of micturition: Secondary | ICD-10-CM | POA: Diagnosis not present

## 2020-09-26 DIAGNOSIS — Z961 Presence of intraocular lens: Secondary | ICD-10-CM | POA: Diagnosis not present

## 2020-09-26 DIAGNOSIS — H353132 Nonexudative age-related macular degeneration, bilateral, intermediate dry stage: Secondary | ICD-10-CM | POA: Diagnosis not present

## 2020-10-08 DIAGNOSIS — Z87440 Personal history of urinary (tract) infections: Secondary | ICD-10-CM | POA: Diagnosis not present

## 2020-10-08 DIAGNOSIS — L309 Dermatitis, unspecified: Secondary | ICD-10-CM | POA: Diagnosis not present

## 2020-10-09 DIAGNOSIS — H524 Presbyopia: Secondary | ICD-10-CM | POA: Diagnosis not present

## 2020-10-09 DIAGNOSIS — H52223 Regular astigmatism, bilateral: Secondary | ICD-10-CM | POA: Diagnosis not present

## 2020-10-09 DIAGNOSIS — H5213 Myopia, bilateral: Secondary | ICD-10-CM | POA: Diagnosis not present

## 2020-10-11 DIAGNOSIS — H43813 Vitreous degeneration, bilateral: Secondary | ICD-10-CM | POA: Diagnosis not present

## 2020-10-11 DIAGNOSIS — H353132 Nonexudative age-related macular degeneration, bilateral, intermediate dry stage: Secondary | ICD-10-CM | POA: Diagnosis not present

## 2020-10-11 DIAGNOSIS — H35033 Hypertensive retinopathy, bilateral: Secondary | ICD-10-CM | POA: Diagnosis not present

## 2020-10-11 DIAGNOSIS — H35372 Puckering of macula, left eye: Secondary | ICD-10-CM | POA: Diagnosis not present

## 2020-11-04 ENCOUNTER — Other Ambulatory Visit: Payer: Self-pay | Admitting: Nurse Practitioner

## 2020-11-04 DIAGNOSIS — I10 Essential (primary) hypertension: Secondary | ICD-10-CM

## 2020-11-05 ENCOUNTER — Encounter: Payer: Self-pay | Admitting: Family Medicine

## 2020-11-05 ENCOUNTER — Other Ambulatory Visit: Payer: Self-pay

## 2020-11-05 ENCOUNTER — Ambulatory Visit (INDEPENDENT_AMBULATORY_CARE_PROVIDER_SITE_OTHER): Payer: Medicare Other | Admitting: Family Medicine

## 2020-11-05 DIAGNOSIS — J029 Acute pharyngitis, unspecified: Secondary | ICD-10-CM | POA: Diagnosis not present

## 2020-11-05 MED ORDER — MAGIC MOUTHWASH W/LIDOCAINE: 2.0000 mL | Freq: Three times a day (TID) | ORAL | Status: DC | PRN

## 2020-11-05 NOTE — Progress Notes (Signed)
Provider:  Alain Honey, MD  Careteam: Patient Care Team: Lauree Chandler, NP as PCP - General (Geriatric Medicine) Rutherford Guys, MD as Consulting Physician (Ophthalmology) Vania Rea, MD as Consulting Physician (Obstetrics and Gynecology) Lavonna Monarch, MD as Consulting Physician (Dermatology)  PLACE OF SERVICE:  [place] Directive information    Allergies  Allergen Reactions   Cymbalta [Duloxetine Hcl] Diarrhea    Chief Complaint  Patient presents with   Acute Visit    Patient presents today for sore/dry throat and possible acid reflux for 2-3 weeks now. She reports taking lozenges and mouth wash.     HPI: Patient is a 85 y.o. female .  Patient reports sore throat for 2 to 3 weeks.  She has not had any fever or headache.  She did take Nexium for about 2 weeks thinking it might be related to reflux.  She says that problem has improved with the PPI and behavioral modification.  Sore throat not worse in the mornings.  She denies postnasal drainage  Review of Systems:  Review of Systems  Constitutional:  Negative for fever.  HENT:  Positive for sore throat.   Respiratory: Negative.    Cardiovascular: Negative.   Gastrointestinal:  Positive for heartburn.  Genitourinary: Negative.   Musculoskeletal: Negative.   All other systems reviewed and are negative.  Past Medical History:  Diagnosis Date   Age-related macular degeneration, dry, both eyes    Alcoholism ([place])    Anemia    Anxiety    Cholelithiasis    Endometriosis    Goiter    Herpes simplex    Hyperlipidemia    Hypertension    Loss of weight    Nontoxic multinodular goiter    Other abnormal blood chemistry    PUD (peptic ulcer disease)    Renal cyst    Sciatica    Scoliosis (and kyphoscoliosis), idiopathic    Unspecified disorder of kidney and ureter    Urinary frequency    Past Surgical History:  Procedure Laterality Date   ABDOMINAL HYSTERECTOMY  1971   CATARACT  EXTRACTION W/ INTRAOCULAR LENS  IMPLANT, BILATERAL Bilateral 2016   Dr. Rutherford Guys   IRRIGATION AND DEBRIDEMENT ABSCESS Left 02/23/2017   Procedure: IRRIGATION AND DEBRIDEMENT ABSCESS;  Surgeon: Mcarthur Rossetti, MD;  Location: [place];  Service: Orthopedics;  Laterality: Left;   REPAIR OF PERFORATED ULCER  1973 &1975   Dr Vida Rigger   STOMACH SURGERY  [phone]   removed 1/2 stomach; "had a total of 5 ORs from 1973-1980; all related to perforated ulcer"   TONSILLECTOMY     Social History:   reports that she quit smoking about 26 years ago. Her smoking use included cigarettes. She has a 40.00 pack-year smoking history. She has never used smokeless tobacco. She reports that she does not drink alcohol and does not use drugs.  Family History  Problem Relation Age of Onset   Obesity Mother    Neuropathy Neg Hx     Medications: Patient's Medications  New Prescriptions   No medications on file  Previous Medications   AMLODIPINE-VALSARTAN (EXFORGE) 10-320 MG TABLET    TAKE 1 TABLET BY MOUTH EVERY DAY   CRANBERRY PO    Take by mouth.   GABAPENTIN (NEURONTIN) 300 MG CAPSULE    TAKE 1 CAPSULE(300 MG) BY MOUTH AT BEDTIME   GEMTESA 75 MG TABS    Take 1 tablet by mouth daily.   LORAZEPAM (ATIVAN) 0.5 MG TABLET  TAKE 1 TABLET(0.5 MG) BY MOUTH TWICE DAILY   MULTIPLE VITAMINS-MINERALS (PRESERVISION AREDS 2 PO)    Take by mouth daily.   NEBIVOLOL (BYSTOLIC) 10 MG TABLET    TAKE 1/2 TABLET(5 MG) BY MOUTH DAILY  Modified Medications   No medications on file  Discontinued Medications   BARRIER CREAM (NON-SPECIFIED) CREA    Apply 1 application topically as needed.   MYRBETRIQ 50 MG TB24 TABLET    Take 50 mg by mouth daily.   PREDNISONE (STERAPRED UNI-PAK 21 TAB) 10 MG (21) TBPK TABLET    Use as directed    Physical Exam:  Vitals:   11/05/20 1313  BP: 118/70  Pulse: 68  Temp: (!) 96.8 F (36 C)  SpO2: 98%  Weight: 108 lb 6.4 oz (49.2 kg)  Height: 5\' 4"  (1.626 m)   Body mass index is  18.61 kg/m. Wt Readings from Last 3 Encounters:  11/05/20 108 lb 6.4 oz (49.2 kg)  07/19/20 111 lb (50.3 kg)  07/08/20 114 lb (51.7 kg)    Physical Exam Vitals and nursing note reviewed.  Constitutional:      Appearance: Normal appearance.  HENT:     Head: Normocephalic.     Mouth/Throat:     Mouth: Mucous membranes are moist.     Pharynx: Oropharynx is clear. No oropharyngeal exudate or posterior oropharyngeal erythema.  Cardiovascular:     Rate and Rhythm: Normal rate.     Pulses: Normal pulses.  Pulmonary:     Effort: Pulmonary effort is normal.     Breath sounds: Normal breath sounds.  Skin:    General: Skin is warm and dry.  Neurological:     General: No focal deficit present.     Mental Status: She is alert and oriented to person, place, and time.    Labs reviewed: Basic Metabolic Panel: Recent Labs    07/16/20 0000  NA 135  K 5.0  CL 105  CO2 22  GLUCOSE 100*  BUN 21  CREATININE 1.44*  CALCIUM 9.5  TSH 0.67   Liver Function Tests: Recent Labs    07/16/20 0000  AST 18  ALT 11  BILITOT 0.3  PROT 6.7   No results for input(s): LIPASE, AMYLASE in the last 8760 hours. No results for input(s): AMMONIA in the last 8760 hours. CBC: Recent Labs    07/16/20 0000  WBC 7.0  NEUTROABS 4,109  HGB 11.9  HCT 36.6  MCV 89.7  PLT 335   Lipid Panel: No results for input(s): CHOL, HDL, LDLCALC, TRIG, CHOLHDL, LDLDIRECT in the last 8760 hours. TSH: Recent Labs    07/16/20 0000  TSH 0.67   A1C: Lab Results  Component Value Date   HGBA1C 5.7 (H) 07/16/2020     Assessment/Plan  1. Pharyngitis, unspecified etiology Clinically this patient does not have strep throat.  Will treat symptomatically with Magic mouthwash and another course of Nexium for 14 days.  I think it is still likely that she could have reflux causing symptoms   Alain Honey, MD [place] [phone]

## 2020-11-05 NOTE — Patient Instructions (Signed)
Take acid inhibitor for 14 days Take magic mouthwash as prescribed

## 2020-11-06 ENCOUNTER — Other Ambulatory Visit: Payer: Self-pay | Admitting: *Deleted

## 2020-11-06 MED ORDER — DIPHENHYDRAMINE HCL 12.5 MG/5ML PO LIQD
5.0000 mL | Freq: Three times a day (TID) | ORAL | 0 refills | Status: DC
Start: 2020-11-06 — End: 2021-01-20

## 2020-11-06 MED ORDER — ESOMEPRAZOLE MAGNESIUM 20 MG PO CPDR
20.0000 mg | DELAYED_RELEASE_CAPSULE | Freq: Every day | ORAL | 0 refills | Status: DC
Start: 2020-11-06 — End: 2020-12-17

## 2020-11-06 NOTE — Telephone Encounter (Signed)
Patient called and stated that she was seen yesterday and Dr. Sabra Heck was going to call her in some Magic Mouthwash and Nexium but the pharmacy has not received it.   Pended Rx's and sent to Dr. Sabra Heck for approval.      OV Note Dated 11/05/2020: Assessment/Plan   1. Pharyngitis, unspecified etiology Clinically this patient does not have strep throat.  Will treat symptomatically with Magic mouthwash and another course of Nexium for 14 days.  I think it is still likely that she could have reflux causing symptoms

## 2020-11-14 ENCOUNTER — Other Ambulatory Visit: Payer: Self-pay | Admitting: Dermatology

## 2020-11-28 DIAGNOSIS — Z681 Body mass index (BMI) 19 or less, adult: Secondary | ICD-10-CM | POA: Diagnosis not present

## 2020-12-04 DIAGNOSIS — N3000 Acute cystitis without hematuria: Secondary | ICD-10-CM | POA: Diagnosis not present

## 2020-12-04 DIAGNOSIS — R350 Frequency of micturition: Secondary | ICD-10-CM | POA: Diagnosis not present

## 2020-12-04 DIAGNOSIS — R300 Dysuria: Secondary | ICD-10-CM | POA: Diagnosis not present

## 2020-12-12 ENCOUNTER — Other Ambulatory Visit: Payer: Self-pay | Admitting: Nurse Practitioner

## 2020-12-12 DIAGNOSIS — I10 Essential (primary) hypertension: Secondary | ICD-10-CM

## 2020-12-13 ENCOUNTER — Other Ambulatory Visit: Payer: Self-pay | Admitting: Physician Assistant

## 2020-12-17 ENCOUNTER — Telehealth: Payer: Self-pay | Admitting: Dermatology

## 2020-12-17 ENCOUNTER — Ambulatory Visit (INDEPENDENT_AMBULATORY_CARE_PROVIDER_SITE_OTHER): Payer: Medicare Other | Admitting: Dermatology

## 2020-12-17 ENCOUNTER — Encounter: Payer: Self-pay | Admitting: Dermatology

## 2020-12-17 ENCOUNTER — Other Ambulatory Visit: Payer: Self-pay

## 2020-12-17 DIAGNOSIS — R208 Other disturbances of skin sensation: Secondary | ICD-10-CM | POA: Diagnosis not present

## 2020-12-17 MED ORDER — GABAPENTIN 300 MG PO CAPS: ORAL_CAPSULE | ORAL | 11 refills | Status: DC

## 2020-12-17 NOTE — Telephone Encounter (Signed)
Patient does not want to take Gabapentin orally because she read on line that the better treatment is for topical Gabapentin.

## 2020-12-19 NOTE — Telephone Encounter (Signed)
Please inform patient that to my knowledge there is no topical gabapentin commercially available and FDA approved for human beings.  There is a veterinary preparation for dogs and cats as well as the possibility of a compounding pharmacy turning the oral gabapentin into a topical formula.  I have seen no studies on using such a product on the scalp and I believe it would be almost impossible to apply a topical gabapentin all over the scalp every day.  She is welcome to check whether any other skin specialist would be willing to prescribe this for her

## 2021-01-04 ENCOUNTER — Encounter: Payer: Self-pay | Admitting: Dermatology

## 2021-01-04 NOTE — Progress Notes (Signed)
   Follow-Up Visit   Subjective  [name] is a 85 y.o. female who presents for the following: Medication Refill (Patient here today for follow up on Dysethesia of the scalp and to get a medication refill of her Gabapentin. Per patient the Gabapentin really helps her. ).  Cutaneous dysesthesia scalp Location:  Duration:  Quality:  Associated Signs/Symptoms: Modifying Factors:  Severity:  Timing: Context:   Objective  Well appearing patient in no apparent distress; mood and affect are within normal limits. Scalp Discordant history provided by patient today.  The device told me that the gabapentin was very helpful to her scalp symptoms albeit not 100% effective.  She is not experiencing any significant side effects.  Later in the visit she stated that she really did not want to be on any systemic treatment for her scalp and inquired about topical therapies.  I reminded her that the reason that we initiated the gabapentin is because she had failed topical therapies.  Not certain whether she wanted to continue the gabapentin.  I again offered for her to get a second opinion at one of the nearby medical centers but for now she is not interested.    A focused examination was performed including neck.. Relevant physical exam findings are noted in the Assessment and Plan.   Assessment & Plan    Dysesthesia of scalp Scalp  Will be provided with refills for Neurontin but I certainly would respect her decision if she chooses to discontinue this medication.  Unfortunately, I do not currently have a Plan B option  gabapentin (NEURONTIN) 300 MG capsule - Scalp TAKE 1 CAPSULE(300 MG) BY MOUTH AT BEDTIME      I, Lavonna Monarch, MD, have reviewed all documentation for this visit.  The documentation on 01/04/21 for the exam, diagnosis, procedures, and orders are all accurate and complete.

## 2021-01-16 ENCOUNTER — Other Ambulatory Visit: Payer: Self-pay

## 2021-01-16 ENCOUNTER — Other Ambulatory Visit: Payer: Medicare Other

## 2021-01-16 DIAGNOSIS — E042 Nontoxic multinodular goiter: Secondary | ICD-10-CM | POA: Diagnosis not present

## 2021-01-16 DIAGNOSIS — D508 Other iron deficiency anemias: Secondary | ICD-10-CM

## 2021-01-16 DIAGNOSIS — N1832 Chronic kidney disease, stage 3b: Secondary | ICD-10-CM | POA: Diagnosis not present

## 2021-01-16 DIAGNOSIS — R739 Hyperglycemia, unspecified: Secondary | ICD-10-CM | POA: Diagnosis not present

## 2021-01-17 LAB — COMPLETE METABOLIC PANEL WITH GFR
AG Ratio: 1.5 (calc) (ref 1.0–2.5)
ALT: 12 U/L (ref 6–29)
AST: 18 U/L (ref 10–35)
Albumin: 4 g/dL (ref 3.6–5.1)
Alkaline phosphatase (APISO): 115 U/L (ref 37–153)
BUN/Creatinine Ratio: 15 (calc) (ref 6–22)
BUN: 19 mg/dL (ref 7–25)
CO2: 23 mmol/L (ref 20–32)
Calcium: 9 mg/dL (ref 8.6–10.4)
Chloride: 106 mmol/L (ref 98–110)
Creat: 1.26 mg/dL — ABNORMAL HIGH (ref 0.60–0.95)
Globulin: 2.7 g/dL (calc) (ref 1.9–3.7)
Glucose, Bld: 96 mg/dL (ref 65–99)
Potassium: 4.8 mmol/L (ref 3.5–5.3)
Sodium: 138 mmol/L (ref 135–146)
Total Bilirubin: 0.4 mg/dL (ref 0.2–1.2)
Total Protein: 6.7 g/dL (ref 6.1–8.1)
eGFR: 42 mL/min/{1.73_m2} — ABNORMAL LOW (ref >=60)

## 2021-01-17 LAB — HEMOGLOBIN A1C
Hgb A1c MFr Bld: 5.6 % of total Hgb (ref <5.7)
Mean Plasma Glucose: 114 mg/dL
eAG (mmol/L): 6.3 mmol/L

## 2021-01-17 LAB — CBC WITH DIFFERENTIAL/PLATELET
Absolute Monocytes: 523 cells/uL (ref 200–950)
Basophils Absolute: 69 cells/uL (ref 0–200)
Basophils Relative: 1.1 %
Eosinophils Absolute: 202 cells/uL (ref 15–500)
Eosinophils Relative: 3.2 %
HCT: 36.6 % (ref 35.0–45.0)
Hemoglobin: 12 g/dL (ref 11.7–15.5)
Lymphs Abs: 2129 cells/uL (ref 850–3900)
MCH: 29.1 pg (ref 27.0–33.0)
MCHC: 32.8 g/dL (ref 32.0–36.0)
MCV: 88.6 fL (ref 80.0–100.0)
MPV: 10.4 fL (ref 7.5–12.5)
Monocytes Relative: 8.3 %
Neutro Abs: 3377 cells/uL (ref 1500–7800)
Neutrophils Relative %: 53.6 %
Platelets: 363 10*3/uL (ref 140–400)
RBC: 4.13 10*6/uL (ref 3.80–5.10)
RDW: 13.4 % (ref 11.0–15.0)
Total Lymphocyte: 33.8 %
WBC: 6.3 10*3/uL (ref 3.8–10.8)

## 2021-01-17 LAB — TSH: TSH: 0.79 mIU/L (ref 0.40–4.50)

## 2021-01-20 ENCOUNTER — Encounter: Payer: Self-pay | Admitting: Nurse Practitioner

## 2021-01-20 ENCOUNTER — Ambulatory Visit (INDEPENDENT_AMBULATORY_CARE_PROVIDER_SITE_OTHER): Payer: Medicare Other | Admitting: Nurse Practitioner

## 2021-01-20 ENCOUNTER — Other Ambulatory Visit: Payer: Self-pay

## 2021-01-20 VITALS — BP 124/72 | HR 69 | Temp 97.8°F | Ht 64.0 in | Wt 109.0 lb

## 2021-01-20 DIAGNOSIS — N3281 Overactive bladder: Secondary | ICD-10-CM

## 2021-01-20 DIAGNOSIS — I10 Essential (primary) hypertension: Secondary | ICD-10-CM | POA: Diagnosis not present

## 2021-01-20 DIAGNOSIS — D508 Other iron deficiency anemias: Secondary | ICD-10-CM

## 2021-01-20 DIAGNOSIS — R739 Hyperglycemia, unspecified: Secondary | ICD-10-CM

## 2021-01-20 DIAGNOSIS — F419 Anxiety disorder, unspecified: Secondary | ICD-10-CM

## 2021-01-20 DIAGNOSIS — L299 Pruritus, unspecified: Secondary | ICD-10-CM

## 2021-01-20 DIAGNOSIS — N1832 Chronic kidney disease, stage 3b: Secondary | ICD-10-CM

## 2021-01-20 NOTE — Progress Notes (Signed)
Careteam: Patient Care Team: Lauree Chandler, NP as PCP - General (Geriatric Medicine) Rutherford Guys, MD as Consulting Physician (Ophthalmology) Vania Rea, MD as Consulting Physician (Obstetrics and Gynecology) Lavonna Monarch, MD as Consulting Physician (Dermatology)  PLACE OF SERVICE:  [place] Directive information Does Patient Have a Medical Advance Directive?: Yes, Type of Advance Directive: [place];Living will, Does patient want to make changes to medical advance directive?: No - Patient declined  Allergies  Allergen Reactions   Cymbalta [Duloxetine Hcl] Diarrhea    Chief Complaint  Patient presents with   Medical Management of Chronic Issues    6 month follow-up and discuss labs (available via mychart for patient viewing). Discuss need for PCV and covid #5 vaccine. Discuss need for eye exam. Foot exam today.      HPI: Patient is a 85 y.o. female for routine follow up   Reports she has never had a diagnosis of diabetes, but has been monitored due to high blood sugars.   Has hx of macular degeneration, seeing retina specialist. Seeing ophthalmology routinely.   Reports ongoing incontinence- seeing urology and taking gemtesa   Anxiety- ongoing, lorazepam helping.   Htn- well controlled on bystolic, amlodipine-valsartan.   Itchy scalp followed by dermatology.   Review of Systems:  Review of Systems  Constitutional:  Negative for chills, fever and weight loss.  HENT:  Negative for tinnitus.   Respiratory:  Negative for cough, sputum production and shortness of breath.   Cardiovascular:  Negative for chest pain, palpitations and leg swelling.  Gastrointestinal:  Negative for abdominal pain, constipation, diarrhea and heartburn.  Genitourinary:  Negative for dysuria, frequency and urgency.  Musculoskeletal:  Negative for back pain, falls, joint pain and myalgias.  Skin: Negative.   Neurological:  Negative for dizziness and  headaches.  Psychiatric/Behavioral:  Negative for depression and memory loss. The patient does not have insomnia.   Past Medical History:  Diagnosis Date   Age-related macular degeneration, dry, both eyes    Alcoholism ([place])    Anemia    Anxiety    Cholelithiasis    Endometriosis    Goiter    Herpes simplex    Hyperlipidemia    Hypertension    Loss of weight    Nontoxic multinodular goiter    Other abnormal blood chemistry    PUD (peptic ulcer disease)    Renal cyst    Sciatica    Scoliosis (and kyphoscoliosis), idiopathic    Unspecified disorder of kidney and ureter    Urinary frequency    Past Surgical History:  Procedure Laterality Date   ABDOMINAL HYSTERECTOMY  1971   CATARACT EXTRACTION W/ INTRAOCULAR LENS  IMPLANT, BILATERAL Bilateral 2016   Dr. Rutherford Guys   IRRIGATION AND DEBRIDEMENT ABSCESS Left 02/23/2017   Procedure: IRRIGATION AND DEBRIDEMENT ABSCESS;  Surgeon: Mcarthur Rossetti, MD;  Location: [facility];  Service: Orthopedics;  Laterality: Left;   REPAIR OF PERFORATED ULCER  1973 &1975   Dr Vida Rigger   STOMACH SURGERY  [phone]   removed 1/2 stomach; "had a total of 5 ORs from 1973-1980; all related to perforated ulcer"   TONSILLECTOMY     Social History:   reports that she quit smoking about 27 years ago. Her smoking use included cigarettes. She has a 40.00 pack-year smoking history. She has never used smokeless tobacco. She reports that she does not drink alcohol and does not use drugs.  Family History  Problem Relation Age of Onset  Obesity Mother    Neuropathy Neg Hx     Medications: Patient's Medications  New Prescriptions   No medications on file  Previous Medications   AMLODIPINE-VALSARTAN (EXFORGE) 10-320 MG TABLET    TAKE 1 TABLET BY MOUTH EVERY DAY   CRANBERRY PO    Take by mouth.   GABAPENTIN (NEURONTIN) 300 MG CAPSULE    TAKE 1 CAPSULE(300 MG) BY MOUTH AT BEDTIME   GEMTESA 75 MG TABS    Take 1 tablet by mouth daily.   LORAZEPAM (ATIVAN)  0.5 MG TABLET    TAKE 1 TABLET(0.5 MG) BY MOUTH TWICE DAILY   MULTIPLE VITAMINS-MINERALS (PRESERVISION AREDS 2 PO)    Take by mouth daily.   NEBIVOLOL (BYSTOLIC) 10 MG TABLET    TAKE 1/2 TABLET(5 MG) BY MOUTH DAILY  Modified Medications   No medications on file  Discontinued Medications   MAGIC MOUTHWASH (LIDOCAINE, DIPHENHYDRAMINE, ALUM & MAG HYDROXIDE) SUSPENSION    Swish and swallow 5 mLs 3 (three) times daily.    Physical Exam:  Vitals:   01/20/21 1414  BP: 124/72  Pulse: 69  Temp: 97.8 F (36.6 C)  TempSrc: Temporal  SpO2: 99%  Weight: 109 lb (49.4 kg)  Height: 5\' 4"  (1.626 m)   Body mass index is 18.71 kg/m. Wt Readings from Last 3 Encounters:  01/20/21 109 lb (49.4 kg)  11/05/20 108 lb 6.4 oz (49.2 kg)  07/19/20 111 lb (50.3 kg)    Physical Exam Constitutional:      General: She is not in acute distress.    Appearance: She is well-developed. She is not diaphoretic.  HENT:     Head: Normocephalic and atraumatic.     Mouth/Throat:     Pharynx: No oropharyngeal exudate.  Eyes:     Conjunctiva/sclera: Conjunctivae normal.     Pupils: Pupils are equal, round, and reactive to light.  Cardiovascular:     Rate and Rhythm: Normal rate and regular rhythm.     Heart sounds: Normal heart sounds.  Pulmonary:     Effort: Pulmonary effort is normal.     Breath sounds: Normal breath sounds.  Abdominal:     General: Bowel sounds are normal.     Palpations: Abdomen is soft.  Musculoskeletal:     Cervical back: Normal range of motion and neck supple.     Right lower leg: No edema.     Left lower leg: No edema.  Skin:    General: Skin is warm and dry.  Neurological:     Mental Status: She is alert.  Psychiatric:        Mood and Affect: Mood normal.    Labs reviewed: Basic Metabolic Panel: Recent Labs    07/16/20 0000 01/16/21 0813  NA 135 138  K 5.0 4.8  CL 105 106  CO2 22 23  GLUCOSE 100* 96  BUN 21 19  CREATININE 1.44* 1.26*  CALCIUM 9.5 9.0  TSH 0.67  0.79   Liver Function Tests: Recent Labs    07/16/20 0000 01/16/21 0813  AST 18 18  ALT 11 12  BILITOT 0.3 0.4  PROT 6.7 6.7   No results for input(s): LIPASE, AMYLASE in the last 8760 hours. No results for input(s): AMMONIA in the last 8760 hours. CBC: Recent Labs    07/16/20 0000 01/16/21 0813  WBC 7.0 6.3  NEUTROABS 4,109 3,377  HGB 11.9 12.0  HCT 36.6 36.6  MCV 89.7 88.6  PLT 335 363   Lipid Panel: No results  for input(s): CHOL, HDL, LDLCALC, TRIG, CHOLHDL, LDLDIRECT in the last 8760 hours. TSH: Recent Labs    07/16/20 0000 01/16/21 0813  TSH 0.67 0.79   A1C: Lab Results  Component Value Date   HGBA1C 5.6 01/16/2021     Assessment/Plan 1. Chronic kidney disease (CKD) stage G3b/A1, moderately decreased glomerular filtration rate (GFR) between 30-44 mL/min/1.73 square meter and albuminuria creatinine ratio less than 30 mg/g (HCC) -Chronic and stable Encourage proper hydration Follow metabolic panel Avoid nephrotoxic meds (NSAIDS)  2. Iron deficiency anemia secondary to inadequate dietary iron intake Stable, continues to increase iron in diet as tolerated, can no take iron supplement.   3. Overactive bladder Doing well on gemtesa, continues with urology follow up.   4. Essential hypertension, benign Blood pressure well controlled Continue current medications Recheck metabolic panel  5. Itchy scalp -stable, followed by dermatology, on gabapentin which helps.   6. Anxiety Stable on lorazepam BID   7. Hyperglycemia -stable.  - Hemoglobin A1c; Future  Next appt: 6 months.  Carlos American. [place], [facility] [phone]

## 2021-02-03 ENCOUNTER — Other Ambulatory Visit: Payer: Self-pay | Admitting: Nurse Practitioner

## 2021-02-03 DIAGNOSIS — I10 Essential (primary) hypertension: Secondary | ICD-10-CM

## 2021-02-04 DIAGNOSIS — R300 Dysuria: Secondary | ICD-10-CM | POA: Diagnosis not present

## 2021-02-12 ENCOUNTER — Other Ambulatory Visit: Payer: Self-pay | Admitting: Nurse Practitioner

## 2021-02-12 DIAGNOSIS — F419 Anxiety disorder, unspecified: Secondary | ICD-10-CM

## 2021-02-12 NOTE — Telephone Encounter (Signed)
Patient has request refill on medication "Lorazepam". Patient last refill was 08/15/2020. Patient has Non Opioid Contract on file dated 02/14/2019. Patient has upcoming appointment 07/29/2021. "Update Contract" added to patient appointment notes. Medication pend and sent to PCP Dewaine Oats Carlos American, NP for approval.

## 2021-03-13 DIAGNOSIS — R350 Frequency of micturition: Secondary | ICD-10-CM | POA: Diagnosis not present

## 2021-03-19 IMAGING — CR DG ABDOMEN 1V
1 series · 1 of 1 positions shown · non-contrast
Comparison: None.

CLINICAL DATA: Abdominal pain for several days, initial encounter

EXAM:
ABDOMEN - 1 VIEW

[t abdomen supine]
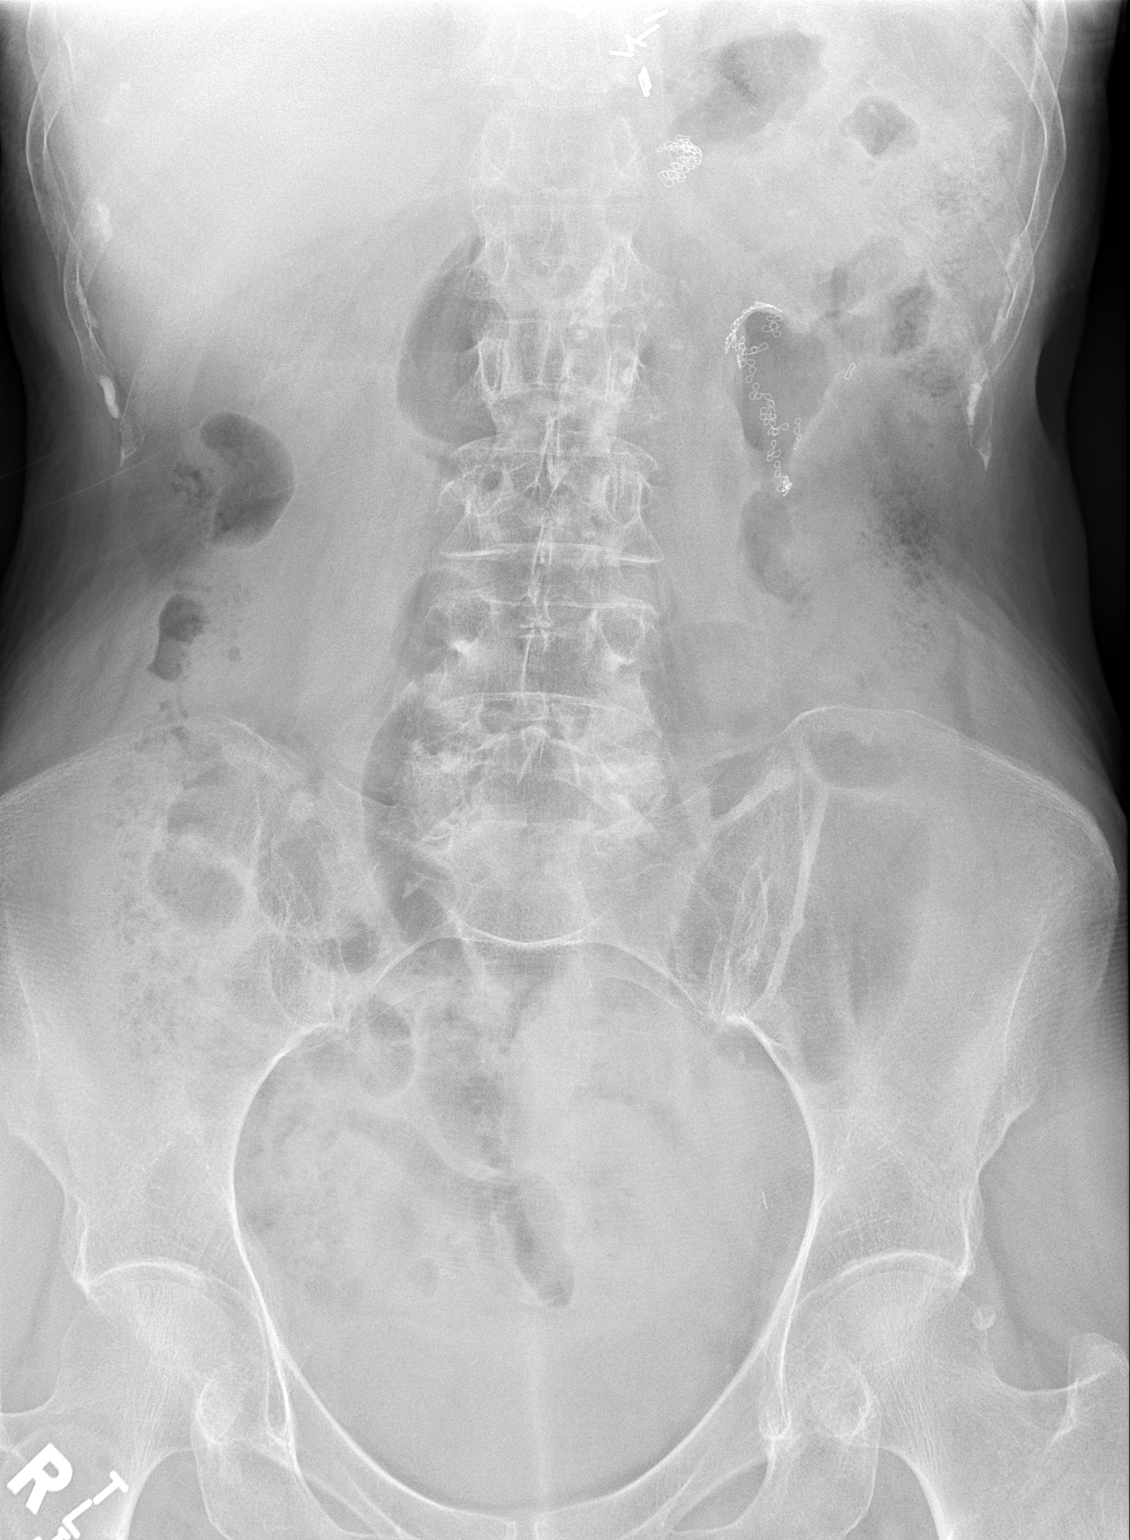

[1 of 1 positions shown; findings below may reference images not displayed]

FINDINGS: Scattered large and small bowel gas is noted. Postsurgical changes
are seen. Mild degenerative changes of lumbar spine are noted. No
free air is seen no mass lesion is noted.
IMPRESSION: No acute abnormality noted.

## 2021-04-10 DIAGNOSIS — R350 Frequency of micturition: Secondary | ICD-10-CM | POA: Diagnosis not present

## 2021-04-15 DIAGNOSIS — H35372 Puckering of macula, left eye: Secondary | ICD-10-CM | POA: Diagnosis not present

## 2021-04-15 DIAGNOSIS — H43813 Vitreous degeneration, bilateral: Secondary | ICD-10-CM | POA: Diagnosis not present

## 2021-04-15 DIAGNOSIS — H353134 Nonexudative age-related macular degeneration, bilateral, advanced atrophic with subfoveal involvement: Secondary | ICD-10-CM | POA: Diagnosis not present

## 2021-04-15 DIAGNOSIS — H35033 Hypertensive retinopathy, bilateral: Secondary | ICD-10-CM | POA: Diagnosis not present

## 2021-04-29 DIAGNOSIS — R350 Frequency of micturition: Secondary | ICD-10-CM | POA: Diagnosis not present

## 2021-05-06 ENCOUNTER — Encounter: Payer: Medicare Other | Admitting: Nurse Practitioner

## 2021-05-06 DIAGNOSIS — R350 Frequency of micturition: Secondary | ICD-10-CM | POA: Diagnosis not present

## 2021-05-08 ENCOUNTER — Encounter: Payer: Medicare Other | Admitting: Nurse Practitioner

## 2021-05-13 DIAGNOSIS — R350 Frequency of micturition: Secondary | ICD-10-CM | POA: Diagnosis not present

## 2021-05-15 ENCOUNTER — Ambulatory Visit (INDEPENDENT_AMBULATORY_CARE_PROVIDER_SITE_OTHER): Payer: Medicare Other | Admitting: Nurse Practitioner

## 2021-05-15 ENCOUNTER — Encounter: Payer: Self-pay | Admitting: Nurse Practitioner

## 2021-05-15 ENCOUNTER — Telehealth: Payer: Self-pay

## 2021-05-15 DIAGNOSIS — R300 Dysuria: Secondary | ICD-10-CM | POA: Diagnosis not present

## 2021-05-15 DIAGNOSIS — Z0000 Encounter for general adult medical examination without abnormal findings: Secondary | ICD-10-CM | POA: Diagnosis not present

## 2021-05-15 DIAGNOSIS — R350 Frequency of micturition: Secondary | ICD-10-CM | POA: Diagnosis not present

## 2021-05-15 NOTE — Progress Notes (Signed)
? ?Subjective:  ? [name] is a 86 y.o. female who presents for Medicare Annual (Subsequent) preventive examination. ? ?Review of Systems    ? ?Cardiac Risk Factors include: hypertension;advanced age (>39mn, >>70women) ? ?   ?Objective:  ?  ?There were no vitals filed for this visit. ?There is no height or weight on file to calculate BMI. ? ? ?  05/15/2021  ?  1:29 PM 01/20/2021  ? 11:43 AM 07/19/2020  ?  2:13 PM 06/27/2020  ? 10:07 AM 06/03/2020  ? 11:29 AM 01/18/2020  ?  2:20 PM 09/14/2019  ?  1:10 PM  ?Advanced Directives  ?Does Patient Have a Medical Advance Directive? Yes Yes Yes Yes Yes Yes Yes  ?Type of Advance Directive Living [place] Healthcare Power of [place];Out of facility DNR (pink MOST or yellow form) Out of facility DNR (pink MOST or yellow form) Out of facility DNR (pink MOST or yellow form)  ?Does patient want to make changes to medical advance directive? No - Patient declined No - Patient declined No - Patient declined No - Patient declined No - Patient declined No - Patient declined No - Patient declined  ?Copy of [place]?  Yes - validated most recent copy scanned in chart (See row information) Yes - validated most recent copy scanned in chart (See row information) Yes - validated most recent copy scanned in chart (See row information) No - copy requested    ?Would patient like information on creating a medical advance directive?     No - Patient declined    ? ? ?Current Medications (verified) ?Outpatient Encounter Medications as of 05/15/2021  ?Medication Sig  ? amLODipine-valsartan (EXFORGE) 10-320 MG tablet TAKE 1 TABLET BY MOUTH EVERY DAY  ? CRANBERRY PO Take by mouth.  ? gabapentin (NEURONTIN) 300 MG capsule TAKE 1 CAPSULE(300 MG) BY MOUTH AT BEDTIME  ? GEMTESA 75 MG TABS Take 1 tablet by mouth daily.  ? LORazepam (ATIVAN) 0.5 MG tablet TAKE 1  TABLET(0.5 MG) BY MOUTH TWICE DAILY  ? Multiple Vitamins-Minerals (PRESERVISION AREDS 2 PO) Take by mouth daily.  ? nebivolol (BYSTOLIC) 10 MG tablet TAKE 1/2 TABLET(5 MG) BY MOUTH DAILY  ? ?No facility-administered encounter medications on file as of 05/15/2021.  ? ? ?Allergies (verified) ?Cymbalta [duloxetine hcl]  ? ?History: ?Past Medical History:  ?Diagnosis Date  ? Age-related macular degeneration, dry, both eyes   ? [place]ism [place]   ? Anemia   ? Anxiety   ? Cholelithiasis   ? Endometriosis   ? Goiter   ? Herpes simplex   ? Hyperlipidemia   ? Hypertension   ? Loss of weight   ? Nontoxic multinodular goiter   ? Other abnormal blood chemistry   ? PUD (peptic ulcer disease)   ? Renal cyst   ? Sciatica   ? Scoliosis (and kyphoscoliosis), idiopathic   ? Unspecified disorder of kidney and ureter   ? Urinary frequency   ? ?Past Surgical History:  ?Procedure Laterality Date  ? ABDOMINAL HYSTERECTOMY  1971  ? CATARACT EXTRACTION W/ INTRAOCULAR LENS  IMPLANT, BILATERAL Bilateral 2016  ? Dr. MRutherford Guys ? IRRIGATION AND DEBRIDEMENT ABSCESS Left 02/23/2017  ? Procedure: IRRIGATION AND DEBRIDEMENT ABSCESS;  Surgeon: BMcarthur Rossetti MD;  [place]  Service: [place]opedics;  Laterality: Left;  ? REPAIR OF PERFORATED ULCER  1973 &1975  ? Dr  Vida Rigger  ? STOMACH SURGERY  [phone]  ? removed 1/2 stomach; "had a total of 5 ORs from 1973-1980; all related to perforated ulcer"  ? TONSILLECTOMY    ? ?Family History  ?Problem Relation Age of Onset  ? Obesity Mother   ? Neuropathy Neg Hx   ? ?Social History  ? ?Socioeconomic History  ? Marital status: Divorced  ?  Spouse name: Not on file  ? Number of children: 0  ? Years of education: Not on file  ? Highest education level: Not on file  ?Occupational History  ? Occupation: retired  ?Tobacco Use  ? Smoking status: Former  ?  Packs/day: 1.00  ?  Years: 40.00  ?  Pack years: 40.00  ?  Types: Cigarettes  ?  Quit date: 01/09/1994  ?  Years since quitting: 27.3  ? Smokeless  tobacco: Never  ?Vaping Use  ? Vaping Use: Never used  ?Substance and Sexual Activity  ? Alcohol use: No  ?  Comment: 02/25/2017 "recovering alcoholic [id number]"  ? Drug use: Never  ? Sexual activity: Not Currently  ?Other Topics Concern  ? Not on file  ?Social History Narrative  ? Lives alone at home  ? Caffeine: 1 large cup each morning   ? ?Social Determinants of Health  ? ?Financial Resource Strain: Not on file  ?Food Insecurity: Not on file  ?Transportation Needs: Not on file  ?Physical Activity: Not on file  ?Stress: Not on file  ?Social Connections: Not on file  ? ? ?Tobacco Counseling ?Counseling given: Not Answered ? ? ?Clinical Intake: ? ?Pre-visit preparation completed: Yes ? ?Pain : No/denies pain ? ?  ? ?BMI - recorded: 18 ?Nutritional Status: BMI <19  Underweight ?Nutritional Risks: Unintentional weight loss ?Diabetes: No ? ?How often do you need to have someone help you when you read instructions, pamphlets, or other written materials from your doctor or pharmacy?: 1 - Never ? ?Diabetic?no ? ?  ? ?  ? ? ?Activities of Daily Living ? ?  05/15/2021  ?  1:48 PM  ?In your present state of health, do you have any difficulty performing the following activities:  ?Hearing? 0  ?Vision? 0  ?Difficulty concentrating or making decisions? 0  ?Walking or climbing stairs? 0  ?Dressing or bathing? 0  ?Doing errands, shopping? 0  ?Preparing Food and eating ? N  ?Using the Toilet? N  ?In the past six months, have you accidently leaked urine? Y  ?Do you have problems with loss of bowel control? N  ?Managing your Medications? N  ?Managing your Finances? N  ?Housekeeping or managing your Housekeeping? N  ? ? ?Patient Care Team: ?Lauree Chandler, NP as PCP - General (Geriatric Medicine) ?Rutherford Guys, MD as Consulting Physician (Ophthalmology) ?Vania Rea, MD as Consulting Physician (Obstetrics and Gynecology) ?Lavonna Monarch, MD as Consulting Physician (Dermatology) ? ?Indicate any recent Medical Services you may  have received from other than [facility] providers in the past year (date may be approximate). ? ?   ?Assessment:  ? This is a routine wellness examination for [name]. ? ?Hearing/Vision screen ?Hearing Screening - Comments:: Patient has no hearing problems ?Vision Screening - Comments:: Patient has macular degeneration. Last eye exam was within past year. Patient sees Dr Gershon Crane and sees Retina specialist. ? ?Dietary issues and exercise activities discussed: ?Current Exercise Habits: Home exercise routine, Type of exercise: walking;calisthenics, Time (Minutes): 60, Frequency (Times/Week): 5, Weekly Exercise (Minutes/Week): 300 ? ? Goals Addressed   ?None ?  ? ?  Depression Screen ? ?  05/15/2021  ?  1:24 PM 01/20/2021  ?  2:16 PM 07/19/2020  ?  2:19 PM 04/30/2020  ?  1:30 PM 01/18/2020  ?  2:20 PM 09/14/2019  ?  1:12 PM 05/04/2019  ?  3:14 PM  ?PHQ 2/9 Scores  ?PHQ - 2 Score 2 0 2 0 0  0  ?PHQ- 9 Score 6  5      ?Exception Documentation      Other- indicate reason in comment box   ?  ?Fall Risk ? ?  05/15/2021  ?  1:28 PM 01/20/2021  ?  2:15 PM 07/19/2020  ?  2:19 PM 06/27/2020  ? 10:07 AM 06/03/2020  ? 11:29 AM  ?Fall Risk   ?Falls in the past year? 0 0 0 0 0  ?Number falls in past yr: 0 0 0 0 0  ?Injury with Fall? 0 0 0 0 0  ?Risk for fall due to : No Fall Risks No Fall Risks     ?Follow up Falls evaluation completed Falls evaluation completed     ? ? ?FALL RISK PREVENTION PERTAINING TO THE HOME: ? ?Any stairs in or around the home? No  ?If so, are there any without handrails? No  ?Home free of loose throw rugs in walkways, pet beds, electrical cords, etc? Yes  ?Adequate lighting in your home to reduce risk of falls? Yes  ? ?ASSISTIVE DEVICES UTILIZED TO PREVENT FALLS: ? ?Life alert? No  ?Use of a cane, walker or w/c? No  ?Grab bars in the bathroom? No  ?Shower chair or bench in shower? No  ?Elevated toilet seat or a handicapped toilet? No  ? ?TIMED UP AND GO: ? ?Was the test performed? No .  ? ?Cognitive Function: ? ?  08/25/2017  ?  1:51  PM 07/06/2016  ?  1:52 PM 07/01/2015  ?  1:00 PM 04/09/2014  ?  1:51 PM  ?MMSE - Mini Mental State Exam  ?Orientation to time '5 5 5 5  '$ ?Orientation to Place '5 5 5 5  '$ ?Registration '3 3 3 3  '$ ?Attention/ Calculation

## 2021-05-15 NOTE — Progress Notes (Signed)
This service is provided via telemedicine ? ?No vital signs collected/recorded due to the encounter was a telemedicine visit.  ? ?Location of patient (ex: home, work):  Home ? ?Patient consents to a telephone visit:  Yes, see encounter dated 05/15/2021 ? ?Location of the provider (ex: office, home):  [place] ? ?Name of any referring provider:  N/A ? ?Names of all persons participating in the telemedicine service and their role in the encounter:  [name], Nurse Practitioner, Carroll Kinds, CMA, and patient.  ? ?Time spent on call:  8 minutes with medical assistant  ?

## 2021-05-15 NOTE — Telephone Encounter (Signed)
Ms. manali, mcelmurry are scheduled for a virtual visit with your provider today.   ? ?Just as we do with appointments in the office, we must obtain your consent to participate.  Your consent will be active for this visit and any virtual visit you may have with one of our providers in the next 365 days.   ? ?If you have a MyChart account, I can also send a copy of this consent to you electronically.  All virtual visits are billed to your insurance company just like a traditional visit in the office.  As this is a virtual visit, video technology does not allow for your provider to perform a traditional examination.  This may limit your provider's ability to fully assess your condition.  If your provider identifies any concerns that need to be evaluated in person or the need to arrange testing such as labs, EKG, etc, we will make arrangements to do so.   ? ?Although advances in technology are sophisticated, we cannot ensure that it will always work on either your end or our end.  If the connection with a video visit is poor, we may have to switch to a telephone visit.  With either a video or telephone visit, we are not always able to ensure that we have a secure connection.   I need to obtain your verbal consent now.   Are you willing to proceed with your visit today?  ? ?ALDINA PORTA has provided verbal consent on 05/15/2021 for a virtual visit (video or telephone). ? ? ?Carroll Kinds, CMA ?05/15/2021  1:33 PM ?  ? ?

## 2021-05-15 NOTE — Patient Instructions (Signed)
[name] , ?Thank you for taking time to come for your Medicare Wellness Visit. I appreciate your ongoing commitment to your health goals. Please review the following plan we discussed and let me know if I can assist you in the future.  ? ?Screening recommendations/referrals: ?Colonoscopy aged out ?Mammogram aged out ?Bone Density declined.  ?Recommended yearly ophthalmology/optometry visit for glaucoma screening and checkup ?Recommended yearly dental visit for hygiene and checkup ? ?Vaccinations: ?Influenza vaccine declined  ?Pneumococcal vaccine declined ?Tdap vaccine declined  ?Shingles vaccine completed.    ? ?Advanced directives: on file  ? ?Conditions/risks identified: advanced age ? ?Next appointment: yearly  ? ? ?Preventive Care 84 Years and Older, Female ?Preventive care refers to lifestyle choices and visits with your health care provider that can promote health and wellness. ?What does preventive care include? ?A yearly physical exam. This is also called an annual well check. ?Dental exams once or twice a year. ?Routine eye exams. Ask your health care provider how often you should have your eyes checked. ?Personal lifestyle choices, including: ?Daily care of your teeth and gums. ?Regular physical activity. ?Eating a healthy diet. ?Avoiding tobacco and drug use. ?Limiting alcohol use. ?Practicing safe sex. ?Taking low-dose aspirin every day. ?Taking vitamin and mineral supplements as recommended by your health care provider. ?What happens during an annual well check? ?The services and screenings done by your health care provider during your annual well check will depend on your age, overall health, lifestyle risk factors, and family history of disease. ?Counseling  ?Your health care provider may ask you questions about your: ?Alcohol use. ?Tobacco use. ?Drug use. ?Emotional well-being. ?Home and relationship well-being. ?Sexual activity. ?Eating habits. ?History of falls. ?Memory and ability to understand  (cognition). ?Work and work Statistician. ?Reproductive health. ?Screening  ?You may have the following tests or measurements: ?Height, weight, and BMI. ?Blood pressure. ?Lipid and cholesterol levels. These may be checked every 5 years, or more frequently if you are over 84 years old. ?Skin check. ?Lung cancer screening. You may have this screening every year starting at age 84 if you have a 30-pack-year history of smoking and currently smoke or have quit within the past 15 years. ?Fecal occult blood test (FOBT) of the stool. You may have this test every year starting at age 84. ?Flexible sigmoidoscopy or colonoscopy. You may have a sigmoidoscopy every 5 years or a colonoscopy every 10 years starting at age 84. ?Hepatitis C blood test. ?Hepatitis B blood test. ?Sexually transmitted disease (STD) testing. ?Diabetes screening. This is done by checking your blood sugar (glucose) after you have not eaten for a while (fasting). You may have this done every 1-3 years. ?Bone density scan. This is done to screen for osteoporosis. You may have this done starting at age 84. ?Mammogram. This may be done every 1-2 years. Talk to your health care provider about how often you should have regular mammograms. ?Talk with your health care provider about your test results, treatment options, and if necessary, the need for more tests. ?Vaccines  ?Your health care provider may recommend certain vaccines, such as: ?Influenza vaccine. This is recommended every year. ?Tetanus, diphtheria, and acellular pertussis (Tdap, Td) vaccine. You may need a Td booster every 10 years. ?Zoster vaccine. You may need this after age 72. ?Pneumococcal 13-valent conjugate (PCV13) vaccine. One dose is recommended after age 84. ?Pneumococcal polysaccharide (PPSV23) vaccine. One dose is recommended after age 84. ?Talk to your health care provider about which screenings and vaccines you need  and how often you need them. ?This information is not intended to  replace advice given to you by your health care provider. Make sure you discuss any questions you have with your health care provider. ?Document Released: 03/01/2015 Document Revised: 10/23/2015 Document Reviewed: 12/04/2014 ?Elsevier Interactive Patient Education ? 2017 [place]. ? ?Fall Prevention in the Home ?Falls can cause injuries. They can happen to people of all ages. There are many things you can do to make your home safe and to help prevent falls. ?What can I do on the outside of my home? ?Regularly fix the edges of walkways and driveways and fix any cracks. ?Remove anything that might make you trip as you walk through a door, such as a raised step or threshold. ?Trim any bushes or trees on the path to your home. ?Use bright outdoor lighting. ?Clear any walking paths of anything that might make someone trip, such as rocks or tools. ?Regularly check to see if handrails are loose or broken. Make sure that both sides of any steps have handrails. ?Any raised decks and porches should have guardrails on the edges. ?Have any leaves, snow, or ice cleared regularly. ?Use sand or salt on walking paths during winter. ?Clean up any spills in your garage right away. This includes oil or grease spills. ?What can I do in the bathroom? ?Use night lights. ?Install grab bars by the toilet and in the tub and shower. Do not use towel bars as grab bars. ?Use non-skid mats or decals in the tub or shower. ?If you need to sit down in the shower, use a plastic, non-slip stool. ?Keep the floor dry. Clean up any water that spills on the floor as soon as it happens. ?Remove soap buildup in the tub or shower regularly. ?Attach bath mats securely with double-sided non-slip rug tape. ?Do not have throw rugs and other things on the floor that can make you trip. ?What can I do in the bedroom? ?Use night lights. ?Make sure that you have a light by your bed that is easy to reach. ?Do not use any sheets or blankets that are too big for  your bed. They should not hang down onto the floor. ?Have a firm chair that has side arms. You can use this for support while you get dressed. ?Do not have throw rugs and other things on the floor that can make you trip. ?What can I do in the kitchen? ?Clean up any spills right away. ?Avoid walking on wet floors. ?Keep items that you use a lot in easy-to-reach places. ?If you need to reach something above you, use a strong step stool that has a grab bar. ?Keep electrical cords out of the way. ?Do not use floor polish or wax that makes floors slippery. If you must use wax, use non-skid floor wax. ?Do not have throw rugs and other things on the floor that can make you trip. ?What can I do with my stairs? ?Do not leave any items on the stairs. ?Make sure that there are handrails on both sides of the stairs and use them. Fix handrails that are broken or loose. Make sure that handrails are as long as the stairways. ?Check any carpeting to make sure that it is firmly attached to the stairs. Fix any carpet that is loose or worn. ?Avoid having throw rugs at the top or bottom of the stairs. If you do have throw rugs, attach them to the floor with carpet tape. ?Make sure that you  have a light switch at the top of the stairs and the bottom of the stairs. If you do not have them, ask someone to add them for you. ?What else can I do to help prevent falls? ?Wear shoes that: ?Do not have high heels. ?Have rubber bottoms. ?Are comfortable and fit you well. ?Are closed at the toe. Do not wear sandals. ?If you use a stepladder: ?Make sure that it is fully opened. Do not climb a closed stepladder. ?Make sure that both sides of the stepladder are locked into place. ?Ask someone to hold it for you, if possible. ?Clearly mark and make sure that you can see: ?Any grab bars or handrails. ?First and last steps. ?Where the edge of each step is. ?Use tools that help you move around (mobility aids) if they are needed. These  include: ?Canes. ?Walkers. ?Scooters. ?Crutches. ?Turn on the lights when you go into a dark area. Replace any light bulbs as soon as they burn out. ?Set up your furniture so you have a clear path. Avoid moving your furniture

## 2021-05-20 DIAGNOSIS — R350 Frequency of micturition: Secondary | ICD-10-CM | POA: Diagnosis not present

## 2021-05-29 DIAGNOSIS — Z681 Body mass index (BMI) 19 or less, adult: Secondary | ICD-10-CM | POA: Diagnosis not present

## 2021-06-02 ENCOUNTER — Other Ambulatory Visit: Payer: Self-pay | Admitting: Nurse Practitioner

## 2021-06-02 DIAGNOSIS — I10 Essential (primary) hypertension: Secondary | ICD-10-CM

## 2021-06-03 DIAGNOSIS — R350 Frequency of micturition: Secondary | ICD-10-CM | POA: Diagnosis not present

## 2021-06-17 DIAGNOSIS — R3915 Urgency of urination: Secondary | ICD-10-CM | POA: Diagnosis not present

## 2021-06-17 DIAGNOSIS — R350 Frequency of micturition: Secondary | ICD-10-CM | POA: Diagnosis not present

## 2021-06-23 ENCOUNTER — Encounter: Payer: Self-pay | Admitting: Family

## 2021-06-23 ENCOUNTER — Ambulatory Visit (INDEPENDENT_AMBULATORY_CARE_PROVIDER_SITE_OTHER): Payer: Medicare Other | Admitting: Family

## 2021-06-23 VITALS — BP 126/72 | HR 64 | Temp 96.9°F | Resp 20 | Ht 64.0 in

## 2021-06-23 DIAGNOSIS — R1030 Lower abdominal pain, unspecified: Secondary | ICD-10-CM

## 2021-06-23 DIAGNOSIS — K4090 Unilateral inguinal hernia, without obstruction or gangrene, not specified as recurrent: Secondary | ICD-10-CM | POA: Diagnosis not present

## 2021-06-23 DIAGNOSIS — K219 Gastro-esophageal reflux disease without esophagitis: Secondary | ICD-10-CM

## 2021-06-23 MED ORDER — OMEPRAZOLE 20 MG PO CPDR: 20.0000 mg | DELAYED_RELEASE_CAPSULE | Freq: Every day | ORAL | 3 refills | Status: DC

## 2021-06-23 NOTE — Patient Instructions (Signed)
Hernia, Adult     A hernia is the bulging of an organ or tissue through a weak spot in the muscles of the abdomen. Hernias develop most often near the belly button (navel) or the area where the leg meets the lower abdomen (groin). Common types of hernias include: Incisional hernia. This type bulges through a scar from an abdominal surgery. Umbilical hernia. This type develops near the navel. Inguinal hernia. This type develops in the groin or scrotum. Femoral hernia. This type develops below the groin, in the upper thigh area. Hiatal hernia. This type occurs when part of the stomach slides above the muscle that separates the abdomen from the chest (diaphragm). What are the causes? This condition may be caused by: Heavy lifting. Coughing over a long period of time. Straining to have a bowel movement. Constipation can lead to straining. An incision made during abdominal surgery. A physical problem that is present at birth (congenital defect). Being overweight or obese. Smoking. Excess fluid in the abdomen. Undescended testicles in males. What are the signs or symptoms? The main symptom is a skin-colored, rounded bulge in the area of the hernia. However, a bulge may not always be present. It may grow bigger or be more visible when you cough or strain (such as when lifting something heavy). A hernia that can be pushed back into the abdomen (is reducible) rarely causes pain. A hernia that cannot be pushed back into the abdomen (is incarcerated) may lose its blood supply (become strangulated). A hernia that is incarcerated may cause: Pain. Fever. Nausea and vomiting. Swelling. Constipation. How is this diagnosed? A hernia may be diagnosed based on: Your symptoms and medical history. A physical exam. Your health care provider may ask you to cough or move in certain ways to see if the hernia becomes visible. Imaging tests, such as: X-rays. Ultrasound. CT scan. How is this treated? A  hernia that is small and painless may not need to be treated. A hernia that is large or painful may be treated with surgery. Inguinal hernias may be treated with surgery to prevent incarceration or strangulation. Strangulated hernias are always treated with surgery because the strangulation causes a lack of blood supply to the trapped organ or tissue. Surgery to treat a hernia involves pushing the bulge back into place and repairing the weak area of the muscle or abdominal wall. Follow these instructions at home: Activity Avoid straining. Do not lift anything that is heavier than 10 lb (4.5 kg), or the limit that you are told, until your health care provider says that it is safe. When lifting heavy objects, lift with your leg muscles, not your back muscles. Preventing constipation Take actions to prevent constipation. Constipation leads to straining with bowel movements, which can make a hernia worse or cause a hernia repair to break down. Your health care provider may recommend that you take these actions to prevent or treat constipation: Drink enough fluid to keep your urine pale yellow. Take over-the-counter or prescription medicines. Eat foods that are high in fiber, such as beans, whole grains, and fresh fruits and vegetables. Limit foods that are high in fat and processed sugars, such as fried or sweet foods. General instructions When coughing, try to cough gently. You may try to push the hernia back in place by very gently pressing on it while lying down. Do not try to force the bulge back in if it will not push in easily. If you are overweight, work with your health care provider   to lose weight safely. Do not use any products that contain nicotine or tobacco. These products include cigarettes, chewing tobacco, and vaping devices, such as e-cigarettes. If you need help quitting, ask your health care provider. If you are scheduled for hernia repair, watch your hernia for any changes in shape,  size, or color. Tell your health care provider about any changes or new symptoms. Take over-the-counter and prescription medicines only as told by your health care provider. Keep all follow-up visits. This is important. Contact a health care provider if: You develop new pain, swelling, or redness around your hernia. You have signs of constipation, such as: Fewer bowel movements in a week than normal. Difficulty having a bowel movement. Stools that are dry, hard, or larger than normal. Get help right away if: You have a fever or chills. You have abdominal pain that gets worse. You feel nauseous or you vomit. You cannot push the hernia back in place by very gently pressing on it while lying down. Do not try to force the bulge back in if it will not go in easily. The hernia: Changes in shape, size, or color. Feels hard or tender. These symptoms may represent a serious problem that is an emergency. Do not wait to see if the symptoms will go away. Get medical help right away. Call your local emergency services (911 in the [place] Do not drive yourself to the hospital. Summary A hernia is the bulging of an organ or tissue through a weak spot in the muscles of the abdomen. The main symptom is a skin-colored bulge in the hernia area. However, a bulge may not always be present. It may grow bigger or more visible when you cough or strain (such as when having a bowel movement). A hernia that is small and painless may not need to be treated. A hernia that is large or painful may be treated with surgery. Surgery to treat a hernia involves pushing the bulge back into place and repairing the weak part of the abdomen. This information is not intended to replace advice given to you by your health care provider. Make sure you discuss any questions you have with your health care provider. Document Revised: 09/11/2019 Document Reviewed: 09/11/2019 Elsevier Patient Education  2023 Elsevier Inc.  

## 2021-06-23 NOTE — Progress Notes (Signed)
? ?Provider: Marlowe Sax FNP-C ? ?Lauree Chandler, NP ? ?Patient Care Team: ?Lauree Chandler, NP as PCP - General (Geriatric Medicine) ?Rutherford Guys, MD as Consulting Physician (Ophthalmology) ?Vania Rea, MD as Consulting Physician (Obstetrics and Gynecology) ?Lavonna Monarch, MD as Consulting Physician (Dermatology) ? ?Extended Emergency Contact Information ?Primary Emergency Contact: Parker,Deborah ? [place] ?Mobile Phone: [phone] ?Relation: Friend ?Secondary Emergency Contact: Jeanella Flattery ?Mobile Phone: [phone] ?Relation: Significant other ? ?Code Status: Full Code  ?Goals of care: Advanced Directive information ? ?  06/23/2021  ? 10:10 AM  ?Advanced Directives  ?Does Patient Have a Medical Advance Directive? Yes  ?Type of Advance Directive Living will  ?Does patient want to make changes to medical advance directive? No - Patient declined  ? ? ? ?Chief Complaint  ?Patient presents with  ? Acute Visit  ?  Patient is here for lower abdominal pain  ? ? ?HPI:  ?Pt is a 86 y.o. female seen today for an acute visit for evaluation of lower abdominal pain.Feels like abdomen has increased in size and more distended than usual.Has had left lower abdominal pain tender whenever she pushes. ?Has a significant history of left inguinal hernia which seems to have also increased in size but feels soft and able to push back.  ?Has had bowel movements but feels like " not empty bowel completely". She denies any bloating,constipation,nausea or vomiting. Also denies any weight loss.   ? ?Has acid reflux which seems to have worsen especially when she lies down.Has taken antiacid in the past.  ? ?Past Medical History:  ?Diagnosis Date  ? Age-related macular degeneration, dry, both eyes   ? Alcoholism ([place])   ? Anemia   ? Anxiety   ? Cholelithiasis   ? Endometriosis   ? Goiter   ? Herpes simplex   ? Hyperlipidemia   ? Hypertension   ? Loss of weight   ? Nontoxic multinodular goiter   ? Other  abnormal blood chemistry   ? PUD (peptic ulcer disease)   ? Renal cyst   ? Sciatica   ? Scoliosis (and kyphoscoliosis), idiopathic   ? Unspecified disorder of kidney and ureter   ? Urinary frequency   ? ?Past Surgical History:  ?Procedure Laterality Date  ? ABDOMINAL HYSTERECTOMY  1971  ? CATARACT EXTRACTION W/ INTRAOCULAR LENS  IMPLANT, BILATERAL Bilateral 2016  ? Dr. Rutherford Guys  ? IRRIGATION AND DEBRIDEMENT ABSCESS Left 02/23/2017  ? Procedure: IRRIGATION AND DEBRIDEMENT ABSCESS;  Surgeon: Mcarthur Rossetti, MD;  Location: [facility];  Service: Orthopedics;  Laterality: Left;  ? REPAIR OF PERFORATED ULCER  1973 &1975  ? Dr Vida Rigger  ? STOMACH SURGERY  [phone]  ? removed 1/2 stomach; "had a total of 5 ORs from 1973-1980; all related to perforated ulcer"  ? TONSILLECTOMY    ? ? ?Allergies  ?Allergen Reactions  ? Cymbalta [Duloxetine Hcl] Diarrhea  ? ? ?Outpatient Encounter Medications as of 06/23/2021  ?Medication Sig  ? amLODipine-valsartan (EXFORGE) 10-320 MG tablet TAKE 1 TABLET BY MOUTH EVERY DAY  ? CRANBERRY PO Take by mouth.  ? gabapentin (NEURONTIN) 300 MG capsule TAKE 1 CAPSULE(300 MG) BY MOUTH AT BEDTIME  ? GEMTESA 75 MG TABS Take 1 tablet by mouth daily.  ? LORazepam (ATIVAN) 0.5 MG tablet TAKE 1 TABLET(0.5 MG) BY MOUTH TWICE DAILY  ? Multiple Vitamins-Minerals (PRESERVISION AREDS 2 PO) Take by mouth daily.  ? nebivolol (BYSTOLIC) 10 MG tablet TAKE 1/2 TABLET(5 MG) BY MOUTH DAILY  ? ?  No facility-administered encounter medications on file as of 06/23/2021.  ? ? ?Review of Systems  ?Constitutional:  Negative for appetite change, chills, fatigue, fever and unexpected weight change.  ?Respiratory:  Negative for cough, chest tightness, shortness of breath and wheezing.   ?Cardiovascular:  Negative for chest pain, palpitations and leg swelling.  ?Gastrointestinal:  Positive for abdominal pain. Negative for abdominal distention, blood in stool, constipation, diarrhea, nausea and vomiting.  ?     Left lower abdominal  pain   ?Genitourinary:  Negative for difficulty urinating, dysuria, flank pain, frequency and urgency.  ?Musculoskeletal:  Negative for arthralgias, back pain, gait problem, joint swelling, myalgias, neck pain and neck stiffness.  ?Skin:  Negative for color change, pallor and rash.  ? ?Immunization History  ?Administered Date(s) Administered  ? PFIZER Comirnaty(Gray Top)Covid-19 Tri-Sucrose Vaccine 11/04/2020  ? PFIZER(Purple Top)SARS-COV-2 Vaccination 03/30/2019, 04/24/2019, 02/05/2020  ? ?There are no preventive care reminders to display for this patient. ? ?  06/03/2020  ? 11:29 AM 06/27/2020  ? 10:07 AM 07/19/2020  ?  2:19 PM 01/20/2021  ?  2:15 PM 05/15/2021  ?  1:28 PM  ?Fall Risk  ?Falls in the past year? 0 0 0 0 0  ?Was there an injury with Fall? 0 0 0 0 0  ?Fall Risk Category Calculator 0 0 0 0 0  ?Fall Risk Category Low Low Low Low Low  ?Patient Fall Risk Level Low fall risk Low fall risk Low fall risk Low fall risk Low fall risk  ?Patient at Risk for Falls Due to    No Fall Risks No Fall Risks  ?Fall risk Follow up    Falls evaluation completed Falls evaluation completed  ? ?Functional Status Survey: ?  ? ?Vitals:  ? 06/23/21 1404  ?BP: 126/72  ?Pulse: 64  ?Resp: 20  ?Temp: (!) 96.9 ?F (36.1 ?C)  ?SpO2: 98%  ?Height: '5\' 4"'$  (1.626 m)  ? ?Body mass index is 18.71 kg/m?Marland Kitchen ?Physical Exam ?Vitals reviewed.  ?Constitutional:   ?   General: She is not in acute distress. ?   Appearance: Normal appearance. She is not ill-appearing or diaphoretic.  ?Eyes:  ?   General: No scleral icterus.    ?   Right eye: No discharge.     ?   Left eye: No discharge.  ?   Extraocular Movements: Extraocular movements intact.  ?   Conjunctiva/sclera: Conjunctivae normal.  ?   Pupils: Pupils are equal, round, and reactive to light.  ?Cardiovascular:  ?   Rate and Rhythm: Normal rate and regular rhythm.  ?   Pulses: Normal pulses.  ?   Heart sounds: Normal heart sounds. No murmur heard. ?  No friction rub. No gallop.  ?Pulmonary:  ?    Effort: Pulmonary effort is normal. No respiratory distress.  ?   Breath sounds: Normal breath sounds. No wheezing, rhonchi or rales.  ?Chest:  ?   Chest wall: No tenderness.  ?Abdominal:  ?   General: Bowel sounds are increased. There is no distension.  ?   Palpations: Abdomen is soft. There is no mass.  ?   Tenderness: There is abdominal tenderness in the left lower quadrant. There is no right CVA tenderness, left CVA tenderness, guarding or rebound.  ?   Hernia: A hernia is present. Hernia is present in the left inguinal area.  ?   Comments: Left inguinal hernia soft and reducible   ?Skin: ?   General: Skin is warm and dry.  ?  Coloration: Skin is not pale.  ?   Findings: No erythema or rash.  ?Neurological:  ?   Mental Status: She is alert and oriented to person, place, and time.  ?   Motor: No weakness.  ?   Gait: Gait normal.  ?Psychiatric:     ?   Mood and Affect: Mood normal.     ?   Speech: Speech normal.     ?   Behavior: Behavior normal.  ? ? ?Labs reviewed: ?Recent Labs  ?  07/16/20 ?0000 01/16/21 ?8502  ?NA 135 138  ?K 5.0 4.8  ?CL 105 106  ?CO2 22 23  ?GLUCOSE 100* 96  ?BUN 21 19  ?CREATININE 1.44* 1.26*  ?CALCIUM 9.5 9.0  ? ?Recent Labs  ?  07/16/20 ?0000 01/16/21 ?7741  ?AST 18 18  ?ALT 11 12  ?BILITOT 0.3 0.4  ?PROT 6.7 6.7  ? ?Recent Labs  ?  07/16/20 ?0000 01/16/21 ?2878  ?WBC 7.0 6.3  ?NEUTROABS 4,109 3,377  ?HGB 11.9 12.0  ?HCT 36.6 36.6  ?MCV 89.7 88.6  ?PLT 335 363  ? ?Lab Results  ?Component Value Date  ? TSH 0.79 01/16/2021  ? ?Lab Results  ?Component Value Date  ? HGBA1C 5.6 01/16/2021  ? ?Lab Results  ?Component Value Date  ? CHOL 220 (H) 06/29/2018  ? HDL 134 06/29/2018  ? [place] 69 06/29/2018  ? TRIG 90 06/29/2018  ? CHOLHDL 1.6 06/29/2018  ? ? ?Significant Diagnostic Results in last 30 days:  ?No results found. ? ?Assessment/Plan ?1. Gastroesophageal reflux disease without esophagitis ?Symptoms controlled. ?H/H stable.No tarry or black stool  ?- advised to avoid eating meals late in  the evening and to avoid aggravating foods and spices. ?- start on Omeprazole  ?- omeprazole (PRILOSEC) 20 MG capsule; Take 1 capsule (20 mg total) by mouth daily.  Dispense: 30 capsule; Refill: 3 ?- Ambulatory r

## 2021-06-24 DIAGNOSIS — R350 Frequency of micturition: Secondary | ICD-10-CM | POA: Diagnosis not present

## 2021-07-01 DIAGNOSIS — R350 Frequency of micturition: Secondary | ICD-10-CM | POA: Diagnosis not present

## 2021-07-07 DIAGNOSIS — Z1231 Encounter for screening mammogram for malignant neoplasm of breast: Secondary | ICD-10-CM | POA: Diagnosis not present

## 2021-07-07 DIAGNOSIS — Z7689 Persons encountering health services in other specified circumstances: Secondary | ICD-10-CM | POA: Diagnosis not present

## 2021-07-07 LAB — HM MAMMOGRAPHY

## 2021-07-08 DIAGNOSIS — R350 Frequency of micturition: Secondary | ICD-10-CM | POA: Diagnosis not present

## 2021-07-09 ENCOUNTER — Encounter: Payer: Self-pay | Admitting: Nurse Practitioner

## 2021-07-15 DIAGNOSIS — R350 Frequency of micturition: Secondary | ICD-10-CM | POA: Diagnosis not present

## 2021-07-17 ENCOUNTER — Ambulatory Visit
Admission: RE | Admit: 2021-07-17 | Discharge: 2021-07-17 | Disposition: A | Discharge status: Home / Self Care | Payer: Medicare Other | Source: Ambulatory Visit | Attending: Family | Admitting: Family

## 2021-07-17 DIAGNOSIS — R109 Unspecified abdominal pain: Secondary | ICD-10-CM | POA: Diagnosis not present

## 2021-07-17 DIAGNOSIS — R1030 Lower abdominal pain, unspecified: Secondary | ICD-10-CM

## 2021-07-24 ENCOUNTER — Other Ambulatory Visit: Payer: Medicare Other

## 2021-07-24 DIAGNOSIS — I10 Essential (primary) hypertension: Secondary | ICD-10-CM

## 2021-07-24 DIAGNOSIS — R739 Hyperglycemia, unspecified: Secondary | ICD-10-CM | POA: Diagnosis not present

## 2021-07-24 DIAGNOSIS — D508 Other iron deficiency anemias: Secondary | ICD-10-CM

## 2021-07-25 DIAGNOSIS — R350 Frequency of micturition: Secondary | ICD-10-CM | POA: Diagnosis not present

## 2021-07-25 LAB — COMPLETE METABOLIC PANEL WITH GFR
AG Ratio: 1.7 (calc) (ref 1.0–2.5)
ALT: 17 U/L (ref 6–29)
AST: 25 U/L (ref 10–35)
Albumin: 4.3 g/dL (ref 3.6–5.1)
Alkaline phosphatase (APISO): 113 U/L (ref 37–153)
BUN/Creatinine Ratio: 11 (calc) (ref 6–22)
BUN: 16 mg/dL (ref 7–25)
CO2: 22 mmol/L (ref 20–32)
Calcium: 9.4 mg/dL (ref 8.6–10.4)
Chloride: 105 mmol/L (ref 98–110)
Creat: 1.44 mg/dL — ABNORMAL HIGH (ref 0.60–0.95)
Globulin: 2.5 g/dL (calc) (ref 1.9–3.7)
Glucose, Bld: 103 mg/dL — ABNORMAL HIGH (ref 65–99)
Potassium: 5 mmol/L (ref 3.5–5.3)
Sodium: 136 mmol/L (ref 135–146)
Total Bilirubin: 0.4 mg/dL (ref 0.2–1.2)
Total Protein: 6.8 g/dL (ref 6.1–8.1)
eGFR: 36 mL/min/{1.73_m2} — ABNORMAL LOW (ref >=60)

## 2021-07-25 LAB — CBC WITH DIFFERENTIAL/PLATELET
Absolute Monocytes: 612 cells/uL (ref 200–950)
Basophils Absolute: 58 cells/uL (ref 0–200)
Basophils Relative: 0.8 %
Eosinophils Absolute: 353 cells/uL (ref 15–500)
Eosinophils Relative: 4.9 %
HCT: 37.6 % (ref 35.0–45.0)
Hemoglobin: 12.2 g/dL (ref 11.7–15.5)
Lymphs Abs: 2448 cells/uL (ref 850–3900)
MCH: 28.4 pg (ref 27.0–33.0)
MCHC: 32.4 g/dL (ref 32.0–36.0)
MCV: 87.6 fL (ref 80.0–100.0)
MPV: 10.8 fL (ref 7.5–12.5)
Monocytes Relative: 8.5 %
Neutro Abs: 3730 cells/uL (ref 1500–7800)
Neutrophils Relative %: 51.8 %
Platelets: 336 10*3/uL (ref 140–400)
RBC: 4.29 10*6/uL (ref 3.80–5.10)
RDW: 13.2 % (ref 11.0–15.0)
Total Lymphocyte: 34 %
WBC: 7.2 10*3/uL (ref 3.8–10.8)

## 2021-07-25 LAB — HEMOGLOBIN A1C
Hgb A1c MFr Bld: 5.9 % of total Hgb — ABNORMAL HIGH (ref <5.7)
Mean Plasma Glucose: 123 mg/dL
eAG (mmol/L): 6.8 mmol/L

## 2021-07-25 NOTE — Patient Instructions (Signed)
Please contact your local pharmacy, previous provider, or insurance carrier for vaccine/immunization records. Ensure that any procedures done outside of [facility] are faxed to us [phone] or you can sign release of records form at the front desk to keep your medical record updated.   ?

## 2021-07-28 ENCOUNTER — Ambulatory Visit (INDEPENDENT_AMBULATORY_CARE_PROVIDER_SITE_OTHER): Payer: Medicare Other | Admitting: Nurse Practitioner

## 2021-07-28 ENCOUNTER — Encounter: Payer: Self-pay | Admitting: Nurse Practitioner

## 2021-07-28 VITALS — BP 130/62 | HR 55 | Temp 97.2°F | Resp 16 | Ht 64.0 in | Wt 109.0 lb

## 2021-07-28 DIAGNOSIS — R935 Abnormal findings on diagnostic imaging of other abdominal regions, including retroperitoneum: Secondary | ICD-10-CM | POA: Diagnosis not present

## 2021-07-28 DIAGNOSIS — I700 Atherosclerosis of aorta: Secondary | ICD-10-CM | POA: Diagnosis not present

## 2021-07-28 DIAGNOSIS — K219 Gastro-esophageal reflux disease without esophagitis: Secondary | ICD-10-CM

## 2021-07-28 DIAGNOSIS — K5900 Constipation, unspecified: Secondary | ICD-10-CM | POA: Diagnosis not present

## 2021-07-28 DIAGNOSIS — N1832 Chronic kidney disease, stage 3b: Secondary | ICD-10-CM | POA: Diagnosis not present

## 2021-07-28 DIAGNOSIS — N3281 Overactive bladder: Secondary | ICD-10-CM | POA: Diagnosis not present

## 2021-07-28 NOTE — Progress Notes (Signed)
Careteam: Patient Care Team: Lauree Chandler, NP as PCP - General (Geriatric Medicine) Rutherford Guys, MD as Consulting Physician (Ophthalmology) Vania Rea, MD as Consulting Physician (Obstetrics and Gynecology) Lavonna Monarch, MD as Consulting Physician (Dermatology)  PLACE OF SERVICE:  [place] Directive information Does Patient Have a Medical Advance Directive?: Yes, Type of Advance Directive: Living will, Does patient want to make changes to medical advance directive?: No - Patient declined  Allergies  Allergen Reactions   Cymbalta [Duloxetine Hcl] Diarrhea    Chief Complaint  Patient presents with   Medical Management of Chronic Issues    6 month follow up/ UPDATE CONTRACT   Immunizations    Discuss the need for Covid Booster.   Review Labs/CT SCAN    Review recent labs/ Discuss CT Scan     HPI: Patient is a 86 y.o. female for routine follow up.   Reports she is eating more fruit and taking senna to help her constipation. She continues to have abdominal discomfort/pain.  She has an appt this week with GI. Chronic pancreatitis noted on CT. No nausea or vomiting.   OAB- she is very unhappy with her treatment options for this. She is currently taking gemtesa which helps frequency but not the leakage  She wears pullups and they are "horrible"  Htn- controlled on amlodipine-valsartan and bystolic  Doing very well on omeprazole, helping indigestion significantly   Review of Systems:  Review of Systems  Constitutional:  Negative for chills, fever and weight loss.  HENT:  Negative for tinnitus.   Respiratory:  Negative for cough, sputum production and shortness of breath.   Cardiovascular:  Negative for chest pain, palpitations and leg swelling.  Gastrointestinal:  Positive for abdominal pain and heartburn. Negative for constipation and diarrhea.  Genitourinary:  Positive for frequency. Negative for dysuria and urgency.  Musculoskeletal:  Negative  for back pain, falls, joint pain and myalgias.  Skin: Negative.   Neurological:  Negative for dizziness and headaches.  Psychiatric/Behavioral:  Negative for depression and memory loss. The patient does not have insomnia.     Past Medical History:  Diagnosis Date   Age-related macular degeneration, dry, both eyes    Alcoholism ([place])    Anemia    Anxiety    Cholelithiasis    Endometriosis    Goiter    Herpes simplex    Hyperlipidemia    Hypertension    Loss of weight    Nontoxic multinodular goiter    Other abnormal blood chemistry    PUD (peptic ulcer disease)    Renal cyst    Sciatica    Scoliosis (and kyphoscoliosis), idiopathic    Unspecified disorder of kidney and ureter    Urinary frequency    Past Surgical History:  Procedure Laterality Date   ABDOMINAL HYSTERECTOMY  1971   CATARACT EXTRACTION W/ INTRAOCULAR LENS  IMPLANT, BILATERAL Bilateral 2016   Dr. Rutherford Guys   IRRIGATION AND DEBRIDEMENT ABSCESS Left 02/23/2017   Procedure: IRRIGATION AND DEBRIDEMENT ABSCESS;  Surgeon: Mcarthur Rossetti, MD;  Location: [facility];  Service: Orthopedics;  Laterality: Left;   REPAIR OF PERFORATED ULCER  1973 &1975   Dr Vida Rigger   STOMACH SURGERY  [phone]   removed 1/2 stomach; "had a total of 5 ORs from 1973-1980; all related to perforated ulcer"   TONSILLECTOMY     Social History:   reports that she quit smoking about 27 years ago. Her smoking use included cigarettes. She has a 40.00  pack-year smoking history. She has never used smokeless tobacco. She reports that she does not drink alcohol and does not use drugs.  Family History  Problem Relation Age of Onset   Obesity Mother    Neuropathy Neg Hx     Medications: Patient's Medications  New Prescriptions   No medications on file  Previous Medications   AMLODIPINE-VALSARTAN (EXFORGE) 10-320 MG TABLET    TAKE 1 TABLET BY MOUTH EVERY DAY   CRANBERRY PO    Take by mouth.   GABAPENTIN (NEURONTIN) 300 MG CAPSULE    TAKE 1  CAPSULE(300 MG) BY MOUTH AT BEDTIME   GEMTESA 75 MG TABS    Take 1 tablet by mouth daily.   LORAZEPAM (ATIVAN) 0.5 MG TABLET    TAKE 1 TABLET(0.5 MG) BY MOUTH TWICE DAILY   MULTIPLE VITAMINS-MINERALS (PRESERVISION AREDS 2 PO)    Take by mouth daily.   NEBIVOLOL (BYSTOLIC) 10 MG TABLET    TAKE 1/2 TABLET(5 MG) BY MOUTH DAILY   OMEPRAZOLE (PRILOSEC) 20 MG CAPSULE    Take 1 capsule (20 mg total) by mouth daily.  Modified Medications   No medications on file  Discontinued Medications   No medications on file    Physical Exam:  Vitals:   07/28/21 1426  BP: 130/62  Pulse: (!) 55  Resp: 16  Temp: (!) 97.2 F (36.2 C)  SpO2: 98%  Weight: 109 lb (49.4 kg)  Height: _0  (1.626 m)   Body mass index is 18.71 kg/m. Wt Readings from Last 3 Encounters:  07/28/21 109 lb (49.4 kg)  01/20/21 109 lb (49.4 kg)  11/05/20 108 lb 6.4 oz (49.2 kg)    Physical Exam Constitutional:      General: She is not in acute distress.    Appearance: She is well-developed. She is not diaphoretic.  HENT:     Head: Normocephalic and atraumatic.     Mouth/Throat:     Pharynx: No oropharyngeal exudate.  Eyes:     Conjunctiva/sclera: Conjunctivae normal.     Pupils: Pupils are equal, round, and reactive to light.  Cardiovascular:     Rate and Rhythm: Normal rate and regular rhythm.     Heart sounds: Normal heart sounds.  Pulmonary:     Effort: Pulmonary effort is normal.     Breath sounds: Normal breath sounds.  Abdominal:     General: Bowel sounds are normal.     Palpations: Abdomen is soft.  Musculoskeletal:     Cervical back: Normal range of motion and neck supple.     Right lower leg: No edema.     Left lower leg: No edema.  Skin:    General: Skin is warm and dry.  Neurological:     Mental Status: She is alert and oriented to person, place, and time. Mental status is at baseline.  Psychiatric:        Mood and Affect: Mood normal.     Labs reviewed: Basic Metabolic Panel: Recent Labs     01/16/21 0813 07/24/21 0807  NA 138 136  K 4.8 5.0  CL 106 105  CO2 23 22  GLUCOSE 96 103*  BUN 19 16  CREATININE 1.26* 1.44*  CALCIUM 9.0 9.4  TSH 0.79  --    Liver Function Tests: Recent Labs    01/16/21 0813 07/24/21 0807  AST 18 25  ALT 12 17  BILITOT 0.4 0.4  PROT 6.7 6.8   No results for input(s): "LIPASE", "AMYLASE" in the last  8760 hours. No results for input(s): "AMMONIA" in the last 8760 hours. CBC: Recent Labs    01/16/21 0813 07/24/21 0807  WBC 6.3 7.2  NEUTROABS 3,377 3,730  HGB 12.0 12.2  HCT 36.6 37.6  MCV 88.6 87.6  PLT 363 336   Lipid Panel: No results for input(s): "CHOL", "HDL", "LDLCALC", "TRIG", "CHOLHDL", "LDLDIRECT" in the last 8760 hours. TSH: Recent Labs    01/16/21 0813  TSH 0.79   A1C: Lab Results  Component Value Date   HGBA1C 5.9 (H) 07/24/2021   CT Abdomen Pelvis Wo Contrast  Result Date: 07/19/2021 CLINICAL DATA:  Left lower quadrant abdominal pain, pelvic pain EXAM: CT ABDOMEN AND PELVIS WITHOUT CONTRAST TECHNIQUE: Multidetector CT imaging of the abdomen and pelvis was performed following the standard protocol without IV contrast. Oral enteric contrast was administered. RADIATION DOSE REDUCTION: This exam was performed according to the departmental dose-optimization program which includes automated exposure control, adjustment of the mA and/or kV according to patient size and/or use of iterative reconstruction technique. COMPARISON:  None Available. FINDINGS: Lower chest: No acute abnormality. Hepatobiliary: No solid liver abnormality is seen. Small calcification of the liver dome (series 2, image 13). Faintly calcified gallstones. No gallbladder wall thickening, or biliary dilatation. Pancreas: Punctuate calcifications of the pancreatic body and tail (series 2, image 17). No pancreatic ductal dilatation or surrounding inflammatory changes. Spleen: Normal in size. Punctuate calcifications of the splenic parenchyma (series 2, image  13). Adrenals/Urinary Tract: Adrenal glands are unremarkable. Kidneys are normal, without renal calculi, solid lesion, or hydronephrosis. Bladder is unremarkable. Stomach/Bowel: Surgical clips about the diaphragmatic hiatus. Stomach is otherwise within normal limits. Small bowel resection and reanastomosis near the duodenal jejunal junction (series 2, image 29). Appendix is not clearly visualized. No evidence of bowel wall thickening, distention, or inflammatory changes. Large burden of stool throughout the colon. Vascular/Lymphatic: Aortic atherosclerosis. No enlarged abdominal or pelvic lymph nodes. Reproductive: Status post hysterectomy. Other: No abdominal wall hernia or abnormality. No ascites. Musculoskeletal: No acute or significant osseous findings. IMPRESSION: 1. No acute noncontrast CT findings of the abdomen or pelvis to explain left lower quadrant pain. 2. Large burden of stool throughout the colon. 3. Cholelithiasis without evidence of acute cholecystitis. 4. Small bowel resection and reanastomosis near the duodenal jejunal junction. 5. Punctuate calcifications of the pancreatic body and tail, suggestive of chronic pancreatitis. Aortic Atherosclerosis (ICD10-I70.0). Electronically Signed   By: Delanna Ahmadi M.D.   On: 07/19/2021 10:10    Assessment/Plan 1. Aortic atherosclerosis ([place]) -unable to tolerate ASA and reports she has never had cholesterol issues - Lipid panel; Future - CBC with Differential/Platelet; Future  2. Constipation, unspecified constipation type -controlled on OTC  3. Chronic kidney disease (CKD) stage G3b/A1, moderately decreased glomerular filtration rate (GFR) between 30-44 mL/min/1.73 square meter and albuminuria creatinine ratio less than 30 mg/g (HCC) -Chronic and stable Encourage proper hydration Follow metabolic panel Avoid nephrotoxic meds (NSAIDS) - CMP with eGFR([facility]); Future  4. Overactive bladder -ongoing, continues on gemtesa.  5. Gastroesophageal  reflux disease without esophagitis -improved on omeprazole. To continue medication with dietary modifications.   6. Abnormal CT of the abdomen -reviewed CT with pt, she is following up with GI on discomfort, no tenderness today on exam.     Return in about 6 months (around 01/27/2022) for routine follow up- labs prior . [facility]. [facility] [phone]

## 2021-07-31 ENCOUNTER — Ambulatory Visit (INDEPENDENT_AMBULATORY_CARE_PROVIDER_SITE_OTHER): Payer: Medicare Other | Admitting: Gastroenterology

## 2021-07-31 ENCOUNTER — Encounter: Payer: Self-pay | Admitting: Gastroenterology

## 2021-07-31 VITALS — BP 132/82 | HR 66 | Ht 64.0 in | Wt 108.0 lb

## 2021-07-31 DIAGNOSIS — K5909 Other constipation: Secondary | ICD-10-CM | POA: Diagnosis not present

## 2021-07-31 NOTE — Progress Notes (Signed)
07/31/2021 [name] [id number] 22-Sep-1935   HISTORY OF PRESENT ILLNESS: This is an 86 year old female who was seen by me back in October 2020 for issues related to anemia.  She declined colonoscopy at that time, very adamant that she did not want one and said that she would "almost rather die" than have a colonoscopy.  Anyway, it looks like her anemia has resolved.  She is here now with complaints of left-sided abdominal pain/discomfort.  She had a CT scan of the abdomen and pelvis without contrast earlier this month that showed the following:   IMPRESSION: 1. No acute noncontrast CT findings of the abdomen or pelvis to explain left lower quadrant pain. 2. Large burden of stool throughout the colon. 3. Cholelithiasis without evidence of acute cholecystitis. 4. Small bowel resection and reanastomosis near the duodenal jejunal junction. 5. Punctuate calcifications of the pancreatic body and tail, suggestive of chronic pancreatitis.   Aortic Atherosclerosis (ICD10-I70.0).  She says that she was surprised about constipation because she has at least one bowel movement every day.  Otherwise she feels good.  No rectal bleeding.  Weight is stable.  No upper abdominal pain.   Past Medical History:  Diagnosis Date   Age-related macular degeneration, dry, both eyes    Alcoholism ([place])    Anemia    Anxiety    Cholelithiasis    Endometriosis    Goiter    Herpes simplex    Hyperlipidemia    Hypertension    Loss of weight    Nontoxic multinodular goiter    Other abnormal blood chemistry    PUD (peptic ulcer disease)    Renal cyst    Sciatica    Scoliosis (and kyphoscoliosis), idiopathic    Unspecified disorder of kidney and ureter    Urinary frequency    Past Surgical History:  Procedure Laterality Date   ABDOMINAL HYSTERECTOMY  1971   CATARACT EXTRACTION W/ INTRAOCULAR LENS  IMPLANT, BILATERAL Bilateral 2016   Dr. Rutherford Guys   IRRIGATION AND DEBRIDEMENT ABSCESS Left  02/23/2017   Procedure: IRRIGATION AND DEBRIDEMENT ABSCESS;  Surgeon: Mcarthur Rossetti, MD;  Location: [facility];  Service: Orthopedics;  Laterality: Left;   REPAIR OF PERFORATED ULCER  1973 &1975   Dr Vida Rigger   STOMACH SURGERY  [phone]   removed 1/2 stomach; "had a total of 5 ORs from 1973-1980; all related to perforated ulcer"   TONSILLECTOMY      reports that she quit smoking about 27 years ago. Her smoking use included cigarettes. She has a 40.00 pack-year smoking history. She has never used smokeless tobacco. She reports that she does not drink alcohol and does not use drugs. family history includes Obesity in her mother. Allergies  Allergen Reactions   Cymbalta [Duloxetine Hcl] Diarrhea      Outpatient Encounter Medications as of 07/31/2021  Medication Sig   amLODipine-valsartan (EXFORGE) 10-320 MG tablet TAKE 1 TABLET BY MOUTH EVERY DAY   CRANBERRY PO Take by mouth.   gabapentin (NEURONTIN) 300 MG capsule TAKE 1 CAPSULE(300 MG) BY MOUTH AT BEDTIME   GEMTESA 75 MG TABS Take 1 tablet by mouth daily.   LORazepam (ATIVAN) 0.5 MG tablet TAKE 1 TABLET(0.5 MG) BY MOUTH TWICE DAILY   Multiple Vitamins-Minerals (PRESERVISION AREDS 2 PO) Take by mouth daily.   nebivolol (BYSTOLIC) 10 MG tablet TAKE 1/2 TABLET(5 MG) BY MOUTH DAILY   omeprazole (PRILOSEC) 20 MG capsule Take 1 capsule (20 mg total) by mouth daily.  Sennosides (SENNA-LAX PO) Take 1 tablet by mouth daily as needed.   No facility-administered encounter medications on file as of 07/31/2021.     REVIEW OF SYSTEMS  : All other systems reviewed and negative except where noted in the History of Present Illness.   PHYSICAL EXAM: BP 132/82   Pulse 66   Ht '5\' 4"'$  (1.626 m)   Wt 108 lb (49 kg)   BMI 18.54 kg/m  General: Well developed white female in no acute distress Head: Normocephalic and atraumatic Eyes:  Sclerae anicteric, conjunctiva pink. Ears: Normal auditory acuity Lungs: Clear throughout to auscultation; no  W/R/R. Heart: Regular rate and rhythm; no M/R/G. Abdomen: Soft, non-distended.  BS present.  Non-tender. Musculoskeletal: Symmetrical with no gross deformities  Skin: No lesions on visible extremities Extremities: No edema  Neurological: Alert oriented x 4, grossly non-focal Psychological:  Alert and cooperative. Normal mood and affect  ASSESSMENT AND PLAN: *Constipation: Large burden of stool throughout the colon seen on recent CT scan.  This could be causing some of her abdominal bloating/fullness and left-sided abdominal pain.  Will instruct her on MiraLAX bowel purge and then she will begin MiraLAX daily.   CC:  Ngetich, Nelda Bucks, NP

## 2021-07-31 NOTE — Patient Instructions (Addendum)
Janett Billow recommends that you complete a bowel purge (to clean out your bowels). Please do the following: Purchase a bottle of Miralax over the counter as well as a box of 5 mg dulcolax tablets. Take 4 dulcolax tablets. Wait 1 hour. You will then drink 6-8 capfuls of Miralax mixed in an adequate amount of water/juice/gatorade (you may choose which of these liquids to drink) over the next 2-3 hours. You should expect results within 1 to 6 hours after completing the bowel purge.  Start Miralax 1 capful daily in 8 ounces of liquid.  If you are age 100 or older, your body mass index should be between 23-30. Your Body mass index is 18.54 kg/m. If this is out of the aforementioned range listed, please consider follow up with your Primary Care Provider.  If you are age 71 or younger, your body mass index should be between 19-25. Your Body mass index is 18.54 kg/m. If this is out of the aformentioned range listed, please consider follow up with your Primary Care Provider.   ________________________________________________________  The [facility] providers would like to encourage you to use [facility] to communicate with providers for non-urgent requests or questions.  Due to long hold times on the telephone, sending your provider a message by [facility] may be a faster and more efficient way to get a response.  Please allow 48 business hours for a response.  Please remember that this is for non-urgent requests.  _______________________________________________________

## 2021-08-04 ENCOUNTER — Ambulatory Visit (INDEPENDENT_AMBULATORY_CARE_PROVIDER_SITE_OTHER): Payer: Medicare Other | Admitting: Dermatology

## 2021-08-04 ENCOUNTER — Encounter: Payer: Self-pay | Admitting: Dermatology

## 2021-08-04 DIAGNOSIS — L570 Actinic keratosis: Secondary | ICD-10-CM

## 2021-08-04 DIAGNOSIS — L738 Other specified follicular disorders: Secondary | ICD-10-CM | POA: Diagnosis not present

## 2021-08-04 DIAGNOSIS — L219 Seborrheic dermatitis, unspecified: Secondary | ICD-10-CM | POA: Diagnosis not present

## 2021-08-04 NOTE — Progress Notes (Signed)
____________________________________________________________  Attending physician addendum:  Thank you for sending this case to me. I have reviewed the entire note and agree with the plan.  Conservative management with bowel regimen is warranted given her age and lack of mass around the high risk findings on CT scan.  Wilfrid Lund, MD  ____________________________________________________________

## 2021-08-04 NOTE — Patient Instructions (Signed)
Try to find OTC P&S Liquid

## 2021-08-08 ENCOUNTER — Encounter: Payer: Self-pay | Admitting: Physician Assistant

## 2021-08-08 ENCOUNTER — Ambulatory Visit (INDEPENDENT_AMBULATORY_CARE_PROVIDER_SITE_OTHER): Payer: Medicare Other | Admitting: Physician Assistant

## 2021-08-08 ENCOUNTER — Other Ambulatory Visit: Payer: Self-pay | Admitting: Nurse Practitioner

## 2021-08-08 ENCOUNTER — Ambulatory Visit (INDEPENDENT_AMBULATORY_CARE_PROVIDER_SITE_OTHER): Payer: Medicare Other

## 2021-08-08 DIAGNOSIS — M79605 Pain in left leg: Secondary | ICD-10-CM | POA: Diagnosis not present

## 2021-08-08 DIAGNOSIS — F419 Anxiety disorder, unspecified: Secondary | ICD-10-CM

## 2021-08-08 NOTE — Telephone Encounter (Signed)
Patient has request refill on medication Lorazepam 0.5mg . Patient last refill dated 02/13/2021. Patient has Non Opioid contract on file dated 08/01/2021. Medication pend and sent to Abbey Chatters, NP.

## 2021-08-30 ENCOUNTER — Encounter: Payer: Self-pay | Admitting: Dermatology

## 2021-08-30 NOTE — Progress Notes (Signed)
   Follow-Up Visit   Subjective  [name] is a 86 y.o. female who presents for the following: Skin Problem (Lesion on scalp- red and flaky, spot under each eye and right temple- ? Psoriasis, eczema ? Tx- otc product ).  Check scalp and face Location:  Duration:  Quality:  Associated Signs/Symptoms: Modifying Factors:  Severity:  Timing: Context:   Objective  Well appearing patient in no apparent distress; mood and affect are within normal limits. Right Root of Nose 2 mm flesh-colored papule with eccentric dell  Mid Frontal Scalp Small 2 mm slightly inflamed gritty crust  Scalp Subtle erythema and scale compatible with seborrheic dermatitis in a woman with chronic scalp dysesthesia    A focused examination was performed including head and neck.. Relevant physical exam findings are noted in the Assessment and Plan.   Assessment & Plan    Sebaceous hyperplasia Right Root of Nose  Told of similar appearance of early BCC so if there is growth or bleeding return for biopsy  Lichenoid actinic keratosis Mid Frontal Scalp  Return if there is growth or bleeding  Seborrheic dermatitis Scalp  She will contact my office if this worsens      I, Lavonna Monarch, MD, have reviewed all documentation for this visit.  The documentation on 08/30/21 for the exam, diagnosis, procedures, and orders are all accurate and complete.

## 2021-09-02 ENCOUNTER — Ambulatory Visit
Admission: RE | Admit: 2021-09-02 | Discharge: 2021-09-02 | Disposition: A | Discharge status: Home / Self Care | Payer: Medicare Other | Source: Ambulatory Visit | Attending: Family | Admitting: Family

## 2021-09-02 ENCOUNTER — Encounter: Payer: Self-pay | Admitting: Family

## 2021-09-02 ENCOUNTER — Ambulatory Visit (INDEPENDENT_AMBULATORY_CARE_PROVIDER_SITE_OTHER): Payer: Medicare Other | Admitting: Family

## 2021-09-02 VITALS — BP 112/70 | HR 70 | Temp 97.0°F | Resp 16 | Ht 64.0 in | Wt 108.2 lb

## 2021-09-02 DIAGNOSIS — M79672 Pain in left foot: Secondary | ICD-10-CM

## 2021-09-02 NOTE — Progress Notes (Signed)
Provider: Julez Huseby FNP-C  Lauree Chandler, NP  Patient Care Team: Lauree Chandler, NP as PCP - General (Geriatric Medicine) Rutherford Guys, MD as Consulting Physician (Ophthalmology) Vania Rea, MD as Consulting Physician (Obstetrics and Gynecology) Lavonna Monarch, MD as Consulting Physician (Dermatology)  Extended Emergency Contact Information Primary Emergency Contact: [place] Phone: [phone] Relation: Friend Secondary Emergency Contact: Jeanella Flattery Mobile Phone: [phone] Relation: Significant other  Code Status:  Full Code  Goals of care: Advanced Directive information    09/02/2021    1:18 PM  Advanced Directives  Does Patient Have a Medical Advance Directive? Yes  Type of Advance Directive Living will  Does patient want to make changes to medical advance directive? No - Patient declined     Chief Complaint  Patient presents with   Acute Visit    Patient complains of hitting left foot on the side of kitchen table.     HPI:  Pt is a 86 y.o. female seen today for an acute visit for evaluation of left foot pain  after hitting side of the kitchen table one and a half week ago.states foot was bruised and swollen.pain has persisted and tends to swell after going for a walk.Pain worst when she steps certain way.Has not been able to wear closed shoes due to pain.she denies any fever or chills.   Past Medical History:  Diagnosis Date   Age-related macular degeneration, dry, both eyes    Alcoholism ([place])    Anemia    Anxiety    Cholelithiasis    Endometriosis    Goiter    Herpes simplex    Hyperlipidemia    Hypertension    Loss of weight    Nontoxic multinodular goiter    Other abnormal blood chemistry    PUD (peptic ulcer disease)    Renal cyst    Sciatica    Scoliosis (and kyphoscoliosis), idiopathic    Unspecified disorder of kidney and ureter    Urinary frequency    Past Surgical History:   Procedure Laterality Date   ABDOMINAL HYSTERECTOMY  1971   CATARACT EXTRACTION W/ INTRAOCULAR LENS  IMPLANT, BILATERAL Bilateral 2016   Dr. Rutherford Guys   IRRIGATION AND DEBRIDEMENT ABSCESS Left 02/23/2017   Procedure: IRRIGATION AND DEBRIDEMENT ABSCESS;  Surgeon: Mcarthur Rossetti, MD;  Location: [facility];  Service: Orthopedics;  Laterality: Left;   REPAIR OF PERFORATED ULCER  1973 &1975   Dr Vida Rigger   STOMACH SURGERY  [phone]   removed 1/2 stomach; "had a total of 5 ORs from 1973-1980; all related to perforated ulcer"   TONSILLECTOMY      Allergies  Allergen Reactions   Cymbalta [Duloxetine Hcl] Diarrhea    Outpatient Encounter Medications as of 09/02/2021  Medication Sig   amLODipine-valsartan (EXFORGE) 10-320 MG tablet TAKE 1 TABLET BY MOUTH EVERY DAY   CRANBERRY PO Take by mouth.   gabapentin (NEURONTIN) 300 MG capsule TAKE 1 CAPSULE(300 MG) BY MOUTH AT BEDTIME   GEMTESA 75 MG TABS Take 1 tablet by mouth daily.   LORazepam (ATIVAN) 0.5 MG tablet TAKE 1 TABLET(0.5 MG) BY MOUTH TWICE DAILY   Multiple Vitamins-Minerals (PRESERVISION AREDS 2 PO) Take by mouth daily.   nebivolol (BYSTOLIC) 10 MG tablet TAKE 1/2 TABLET(5 MG) BY MOUTH DAILY   omeprazole (PRILOSEC) 20 MG capsule Take 1 capsule (20 mg total) by mouth daily.   Sennosides (SENNA-LAX PO) Take 1 tablet by mouth daily as needed.   No  facility-administered encounter medications on file as of 09/02/2021.    Review of Systems  Constitutional:  Negative for chills and fever.  Musculoskeletal:  Negative for gait problem and joint swelling.       Left foot pain   Skin:  Negative for color change and pallor.  Neurological:  Negative for weakness and numbness.    Immunization History  Administered Date(s) Administered   PFIZER Comirnaty(Gray Top)Covid-19 Tri-Sucrose Vaccine 11/04/2020   PFIZER(Purple Top)SARS-COV-2 Vaccination 03/30/2019, 04/24/2019, 02/05/2020   Zoster Recombinat (Shingrix) 09/30/2020, 12/02/2020    There are no preventive care reminders to display for this patient.    01/20/2021    2:15 PM 05/15/2021    1:28 PM 06/23/2021    2:27 PM 07/28/2021    2:28 PM 09/02/2021    1:18 PM  Fall Risk  Falls in the past year? 0 0 0 0 0  Was there an injury with Fall? 0 0 0 0 0  Fall Risk Category Calculator 0 0 0 0 0  Fall Risk Category Low Low Low Low Low  Patient Fall Risk Level Low fall risk Low fall risk Low fall risk Low fall risk Low fall risk  Patient at Risk for Falls Due to No Fall Risks No Fall Risks No Fall Risks No Fall Risks No Fall Risks  Fall risk Follow up Falls evaluation completed Falls evaluation completed Falls evaluation completed Falls evaluation completed Falls evaluation completed   Functional Status Survey:    Vitals:   09/02/21 1312  BP: 112/70  Pulse: 70  Resp: 16  Temp: (!) 97 F (36.1 C)  SpO2: 97%  Weight: 108 lb 3.2 oz (49.1 kg)  Height: '5\' 4"'$  (1.626 m)   Body mass index is 18.57 kg/m. Physical Exam Constitutional:      General: She is not in acute distress.    Appearance: She is not ill-appearing.  Cardiovascular:     Rate and Rhythm: Normal rate and regular rhythm.     Pulses: Normal pulses.     Heart sounds: Normal heart sounds. No murmur heard.    No friction rub. No gallop.  Pulmonary:     Effort: Pulmonary effort is normal. No respiratory distress.     Breath sounds: Normal breath sounds. No wheezing, rhonchi or rales.  Chest:     Chest wall: No tenderness.  Musculoskeletal:     Right foot: Normal.     Left foot: Normal range of motion and normal capillary refill. Tenderness present. No swelling or crepitus. Normal pulse.  Skin:    General: Skin is warm and dry.     Coloration: Skin is not pale.     Findings: No bruising or erythema.  Neurological:     Mental Status: She is alert and oriented to person, place, and time.     Sensory: No sensory deficit.     Motor: No weakness.     Coordination: Coordination normal.     Gait: Gait  normal.  Psychiatric:        Mood and Affect: Mood normal.        Behavior: Behavior normal.    Labs reviewed: Recent Labs    01/16/21 0813 07/24/21 0807  NA 138 136  K 4.8 5.0  CL 106 105  CO2 23 22  GLUCOSE 96 103*  BUN 19 16  CREATININE 1.26* 1.44*  CALCIUM 9.0 9.4   Recent Labs    01/16/21 0813 07/24/21 0807  AST 18 25  ALT 12 17  BILITOT 0.4 0.4  PROT 6.7 6.8   Recent Labs    01/16/21 0813 07/24/21 0807  WBC 6.3 7.2  NEUTROABS 3,377 3,730  HGB 12.0 12.2  HCT 36.6 37.6  MCV 88.6 87.6  PLT 363 336   Lab Results  Component Value Date   TSH 0.79 01/16/2021   Lab Results  Component Value Date   HGBA1C 5.9 (H) 07/24/2021   Lab Results  Component Value Date   CHOL 220 (H) 06/29/2018   HDL 134 06/29/2018   LDLCALC 69 06/29/2018   TRIG 90 06/29/2018   CHOLHDL 1.6 06/29/2018    Significant Diagnostic Results in last 30 days:  XR Tibia/Fibula Left  Result Date: 08/08/2021 2 view radiographs of her tib-fib demonstrate well-maintained alignment no osseous abnormalities no fractures   Assessment/Plan  Acute foot pain, left 4 th toe and base of toes tender to palpation.No swelling or bruise noted. - continue on OTC Tylenol as needed  - will obtain imaging  - Advised to get a left foot  X-ray at [facility] at [address] then will call you with results. - DG Foot Complete Left; Future  Family/ staff Communication: Reviewed plan of care with patient verbalized understanding  Labs/tests ordered: - DG Foot Complete Left; Future  Next Appointment: Return if symptoms worsen or fail to improve.   Sandrea Hughs, NP

## 2021-09-02 NOTE — Patient Instructions (Signed)
- -   Please get left foot  X-ray at [place] at [facility] then will call you with results.

## 2021-09-09 ENCOUNTER — Ambulatory Visit: Payer: Medicare Other

## 2021-09-09 ENCOUNTER — Encounter: Payer: Self-pay | Admitting: Podiatry

## 2021-09-09 ENCOUNTER — Ambulatory Visit (INDEPENDENT_AMBULATORY_CARE_PROVIDER_SITE_OTHER): Payer: Medicare Other | Admitting: Podiatry

## 2021-09-09 DIAGNOSIS — S9032XA Contusion of left foot, initial encounter: Secondary | ICD-10-CM

## 2021-09-09 NOTE — Progress Notes (Signed)
Subjective:  Patient ID: [name], female    DOB: [birth year],  MRN: [id number] HPI Chief Complaint  Patient presents with   Foot Injury    Dorsal forefoot at 3rd and 4th toes left - injury x 2 weeks ago, had xray done 09/02/21 (neg fx), still swollen and tender, states she is only able to wear open shoes and affecting her gait   New Patient (Initial Visit)    Est pt 12/2018    86 y.o. female presents with the above complaint.   ROS: Denies fever chills nausea vomiting muscle aches pains back pain chest pain shortness of breath.  Past Medical History:  Diagnosis Date   Age-related macular degeneration, dry, both eyes    Alcoholism ([place])    Anemia    Anxiety    Cholelithiasis    Endometriosis    Goiter    Herpes simplex    Hyperlipidemia    Hypertension    Loss of weight    Nontoxic multinodular goiter    Other abnormal blood chemistry    PUD (peptic ulcer disease)    Renal cyst    Sciatica    Scoliosis (and kyphoscoliosis), idiopathic    Unspecified disorder of kidney and ureter    Urinary frequency    Past Surgical History:  Procedure Laterality Date   ABDOMINAL HYSTERECTOMY  1971   CATARACT EXTRACTION W/ INTRAOCULAR LENS  IMPLANT, BILATERAL Bilateral 2016   Dr. Rutherford Guys   IRRIGATION AND DEBRIDEMENT ABSCESS Left 02/23/2017   Procedure: IRRIGATION AND DEBRIDEMENT ABSCESS;  Surgeon: Mcarthur Rossetti, MD;  Location: [facility];  Service: Orthopedics;  Laterality: Left;   REPAIR OF PERFORATED ULCER  1973 &1975   Dr Vida Rigger   STOMACH SURGERY  [phone]   removed 1/2 stomach; "had a total of 5 ORs from 1973-1980; all related to perforated ulcer"   TONSILLECTOMY      Current Outpatient Medications:    acyclovir ointment (ZOVIRAX) 5 %, Apply topically., Disp: , Rfl:    amLODipine-valsartan (EXFORGE) 10-320 MG tablet, TAKE 1 TABLET BY MOUTH EVERY DAY, Disp: 90 tablet, Rfl: 3   CRANBERRY PO, Take by mouth., Disp: , Rfl:    gabapentin (NEURONTIN) 300 MG capsule, TAKE 1  CAPSULE(300 MG) BY MOUTH AT BEDTIME, Disp: 30 capsule, Rfl: 11   GEMTESA 75 MG TABS, Take 1 tablet by mouth daily., Disp: , Rfl:    LORazepam (ATIVAN) 0.5 MG tablet, TAKE 1 TABLET(0.5 MG) BY MOUTH TWICE DAILY, Disp: 60 tablet, Rfl: 5   Multiple Vitamins-Minerals (PRESERVISION AREDS 2 PO), Take by mouth daily., Disp: , Rfl:    nebivolol (BYSTOLIC) 10 MG tablet, TAKE 1/2 TABLET(5 MG) BY MOUTH DAILY, Disp: 45 tablet, Rfl: 1   omeprazole (PRILOSEC) 20 MG capsule, Take 1 capsule (20 mg total) by mouth daily., Disp: 30 capsule, Rfl: 3   PREMARIN vaginal cream, Place 0.5 g vaginally 2 (two) times a week., Disp: , Rfl:    Sennosides (SENNA-LAX PO), Take 1 tablet by mouth daily as needed., Disp: , Rfl:    triamcinolone cream (KENALOG) 0.1 %, SMARTSIG:sparingly Topical Twice Daily, Disp: , Rfl:   Allergies  Allergen Reactions   Cymbalta [Duloxetine Hcl] Diarrhea   Review of Systems Objective:  There were no vitals filed for this visit.  General: Well developed, nourished, in no acute distress, alert and oriented x3   Dermatological: Skin is warm, dry and supple bilateral. Nails x 10 are well maintained; remaining integument appears unremarkable at this time. There are  no open sores, no preulcerative lesions, no rash or signs of infection present.  Vascular: Dorsalis Pedis artery and Posterior Tibial artery pedal pulses are 2/4 bilateral with immedate capillary fill time. Pedal hair growth present. No varicosities and no lower extremity edema present bilateral.    Neruologic: Grossly intact via light touch bilateral. Vibratory intact via tuning fork bilateral. Protective threshold with Semmes Wienstein monofilament intact to all pedal sites bilateral. Patellar and Achilles deep tendon reflexes 2+ bilateral. No Babinski or clonus noted bilateral.  Allodynia dorsal aspect left foot  Musculoskeletal: No gross boney pedal deformities bilateral. No pain, crepitus, or limitation noted with foot and ankle  range of motion bilateral. Muscular strength 5/5 in all groups tested bilateral.  She has mild swelling and tenderness on palpation of the fourth toe as well as the intermetatarsal ligament.  Gait: Unassisted, Nonantalgic.    Radiographs:  I reviewed radiographs today which demonstrates a nondisplaced possible fracture with a small step-off of the head of the fourth digit left foot proximal phalanx.  Assessment & Plan:   Assessment: Contusion foot allodynia possible fracture fourth toe nondisplaced  Plan: Placed her in a [place] shoe we will follow-up with her in 6 weeks if not improved.  Another x-ray will be necessary at that time     Teigen Parslow T[address] [place], [place]

## 2021-09-15 ENCOUNTER — Other Ambulatory Visit: Payer: Self-pay | Admitting: Family

## 2021-09-15 DIAGNOSIS — K219 Gastro-esophageal reflux disease without esophagitis: Secondary | ICD-10-CM

## 2021-09-16 ENCOUNTER — Encounter: Payer: Self-pay | Admitting: Family

## 2021-09-16 ENCOUNTER — Ambulatory Visit (INDEPENDENT_AMBULATORY_CARE_PROVIDER_SITE_OTHER): Payer: Medicare Other | Admitting: Family

## 2021-09-16 VITALS — BP 108/60 | HR 70 | Temp 97.3°F | Resp 16 | Ht 64.0 in | Wt 104.6 lb

## 2021-09-16 DIAGNOSIS — R208 Other disturbances of skin sensation: Secondary | ICD-10-CM

## 2021-09-16 DIAGNOSIS — R197 Diarrhea, unspecified: Secondary | ICD-10-CM

## 2021-09-16 DIAGNOSIS — K219 Gastro-esophageal reflux disease without esophagitis: Secondary | ICD-10-CM

## 2021-09-16 DIAGNOSIS — R5383 Other fatigue: Secondary | ICD-10-CM | POA: Diagnosis not present

## 2021-09-16 MED ORDER — GABAPENTIN 300 MG PO CAPS: ORAL_CAPSULE | ORAL | 0 refills | Status: DC

## 2021-09-16 MED ORDER — OMEPRAZOLE 20 MG PO CPDR: 20.0000 mg | DELAYED_RELEASE_CAPSULE | Freq: Two times a day (BID) | ORAL | 3 refills | Status: DC

## 2021-09-16 NOTE — Progress Notes (Signed)
Provider: Mckayla Mulcahey FNP-C  Lauree Chandler, NP  Patient Care Team: Lauree Chandler, NP as PCP - General (Geriatric Medicine) Rutherford Guys, MD as Consulting Physician (Ophthalmology) Vania Rea, MD as Consulting Physician (Obstetrics and Gynecology) Lavonna Monarch, MD as Consulting Physician (Dermatology)  Extended Emergency Contact Information Primary Emergency Contact: [place] Phone: [phone] Relation: Friend Secondary Emergency Contact: Jeanella Flattery Mobile Phone: [phone] Relation: Significant other  Code Status:  Full Code  Goals of care: Advanced Directive information    09/16/2021    1:53 PM  Advanced Directives  Does Patient Have a Medical Advance Directive? Yes  Type of Advance Directive Living will  Does patient want to make changes to medical advance directive? No - Patient declined     Chief Complaint  Patient presents with   Acute Visit    Patient complains of not feeling well. Symptoms include huge amounts of phlegm, stool concerns, acid reflux, and fatigued.     HPI:  Pt is a 86 y.o. female seen today for an acute visit for evaluation of huge amounts of phlegm and fatigue.  Had diarrhea 3 days ago felt very weak and a friend gave her some probiotics which helped.  Has been on a bland diet which has also helped.  Also drunk some hydration fluid that her friend gave. She denies any blood in the stool, abdominal pain, cramping, bloating or vomiting.  She did have some nausea and has been drinking some ginger ale to help.  Denies any fever or chills.  Has not been in contact with anybody sick or eaten other restaurants. Had bad huge amount of phlegm last night despite taking omeprazole for acid reflux.  Stated this made her shaky like she had a fever but felt much better. States the main reason she is here today is to have gabapentin refilled.  She takes gabapentin for Dysesthesia of the scalp.  She has  been following up with her dermatologist but recently closed the practice and now she is in the process of looking for a new dermatologist.  She requests referral to [facility] dermatologist.   Past Medical History:  Diagnosis Date   Age-related macular degeneration, dry, both eyes    Alcoholism ([place])    Anemia    Anxiety    Cholelithiasis    Endometriosis    Goiter    Herpes simplex    Hyperlipidemia    Hypertension    Loss of weight    Nontoxic multinodular goiter    Other abnormal blood chemistry    PUD (peptic ulcer disease)    Renal cyst    Sciatica    Scoliosis (and kyphoscoliosis), idiopathic    Unspecified disorder of kidney and ureter    Urinary frequency    Past Surgical History:  Procedure Laterality Date   ABDOMINAL HYSTERECTOMY  1971   CATARACT EXTRACTION W/ INTRAOCULAR LENS  IMPLANT, BILATERAL Bilateral 2016   Dr. Rutherford Guys   IRRIGATION AND DEBRIDEMENT ABSCESS Left 02/23/2017   Procedure: IRRIGATION AND DEBRIDEMENT ABSCESS;  Surgeon: Mcarthur Rossetti, MD;  Location: [facility];  Service: Orthopedics;  Laterality: Left;   REPAIR OF PERFORATED ULCER  1973 &1975   Dr Vida Rigger   STOMACH SURGERY  [phone]   removed 1/2 stomach; "had a total of 5 ORs from 1973-1980; all related to perforated ulcer"   TONSILLECTOMY      Allergies  Allergen Reactions   Cymbalta [Duloxetine Hcl] Diarrhea    Outpatient  Encounter Medications as of 09/16/2021  Medication Sig   acyclovir ointment (ZOVIRAX) 5 % Apply topically.   amLODipine-valsartan (EXFORGE) 10-320 MG tablet TAKE 1 TABLET BY MOUTH EVERY DAY   CRANBERRY PO Take by mouth.   GEMTESA 75 MG TABS Take 1 tablet by mouth daily.   LORazepam (ATIVAN) 0.5 MG tablet TAKE 1 TABLET(0.5 MG) BY MOUTH TWICE DAILY   Multiple Vitamins-Minerals (PRESERVISION AREDS 2 PO) Take by mouth daily.   nebivolol (BYSTOLIC) 10 MG tablet TAKE 1/2 TABLET(5 MG) BY MOUTH DAILY   omeprazole (PRILOSEC) 20 MG capsule TAKE 1 CAPSULE(20 MG) BY MOUTH  DAILY   PREMARIN vaginal cream Place 0.5 g vaginally 2 (two) times a week.   Sennosides (SENNA-LAX PO) Take 1 tablet by mouth daily as needed.   triamcinolone cream (KENALOG) 0.1 % Apply 1 Application topically 2 (two) times daily.   [DISCONTINUED] gabapentin (NEURONTIN) 300 MG capsule TAKE 1 CAPSULE(300 MG) BY MOUTH AT BEDTIME   gabapentin (NEURONTIN) 300 MG capsule TAKE 1 CAPSULE(300 MG) BY MOUTH AT BEDTIME   [DISCONTINUED] triamcinolone cream (KENALOG) 0.1 % SMARTSIG:sparingly Topical Twice Daily   No facility-administered encounter medications on file as of 09/16/2021.    Review of Systems  Constitutional:  Negative for appetite change, chills, fever and unexpected weight change.       Had fatigue but has improved with drinking fluids  HENT:  Negative for congestion, hearing loss, nosebleeds, postnasal drip, rhinorrhea, sinus pressure, sinus pain, sneezing, sore throat and trouble swallowing.   Eyes:  Negative for pain, discharge, redness, itching and visual disturbance.  Respiratory:  Negative for cough, chest tightness, shortness of breath and wheezing.   Cardiovascular:  Negative for chest pain, palpitations and leg swelling.  Gastrointestinal:  Negative for abdominal distention, abdominal pain, blood in stool, constipation, nausea and vomiting.       Diarrhea 3 Days ago  Genitourinary:  Negative for difficulty urinating, dysuria, flank pain, frequency and urgency.  Skin:  Negative for color change, pallor and rash.  Neurological:  Negative for dizziness, syncope, speech difficulty, weakness, light-headedness, numbness and headaches.  Hematological:  Does not bruise/bleed easily.  Psychiatric/Behavioral:  Negative for agitation, behavioral problems, confusion, hallucinations and sleep disturbance. The patient is not nervous/anxious.     Immunization History  Administered Date(s) Administered   PFIZER Comirnaty(Gray Top)Covid-19 Tri-Sucrose Vaccine 11/04/2020   PFIZER(Purple  Top)SARS-COV-2 Vaccination 03/30/2019, 04/24/2019, 02/05/2020   Zoster Recombinat (Shingrix) 09/30/2020, 12/02/2020   There are no preventive care reminders to display for this patient.    05/15/2021    1:28 PM 06/23/2021    2:27 PM 07/28/2021    2:28 PM 09/02/2021    1:18 PM 09/16/2021    1:52 PM  [place] in the past year? 0 0 0 0 0  Was there an injury with Fall? 0 0 0 0 0  Fall Risk Category Calculator 0 0 0 0 0  Fall Risk Category Low Low Low Low Low  Patient Fall Risk Level Low fall risk Low fall risk Low fall risk Low fall risk Low fall risk  Patient at Risk for Falls Due to No Fall Risks No Fall Risks No Fall Risks No Fall Risks No Fall Risks  Fall risk Follow up Falls evaluation completed Falls evaluation completed Falls evaluation completed Falls evaluation completed Falls evaluation completed   Functional Status Survey:    Vitals:   09/16/21 1342  BP: 108/60  Pulse: 70  Resp: 16  Temp: (!) 97.3 F (36.3 C)  SpO2: 96%  Weight: 104 lb 9.6 oz (47.4 kg)  Height: '5\' 4"'$  (1.626 m)   Body mass index is 17.95 kg/m. Physical Exam Vitals reviewed.  Constitutional:      General: She is not in acute distress.    Appearance: Normal appearance. She is normal weight. She is not ill-appearing or diaphoretic.  HENT:     Head: Normocephalic.     Right Ear: Tympanic membrane, ear canal and external ear normal. There is no impacted cerumen.     Left Ear: Tympanic membrane, ear canal and external ear normal. There is no impacted cerumen.     Nose: Nose normal. No congestion or rhinorrhea.     Mouth/Throat:     Mouth: Mucous membranes are moist.     Pharynx: Oropharynx is clear. No oropharyngeal exudate or posterior oropharyngeal erythema.  Eyes:     General: No scleral icterus.       Right eye: No discharge.        Left eye: No discharge.     Extraocular Movements: Extraocular movements intact.     Conjunctiva/sclera: Conjunctivae normal.     Pupils: Pupils are equal,  round, and reactive to light.  Neck:     Vascular: No carotid bruit.  Cardiovascular:     Rate and Rhythm: Normal rate and regular rhythm.     Pulses: Normal pulses.     Heart sounds: Normal heart sounds. No murmur heard.    No friction rub. No gallop.  Pulmonary:     Effort: Pulmonary effort is normal. No respiratory distress.     Breath sounds: Normal breath sounds. No wheezing, rhonchi or rales.  Chest:     Chest wall: No tenderness.  Abdominal:     General: Bowel sounds are normal. There is no distension.     Palpations: Abdomen is soft. There is no mass.     Tenderness: There is no abdominal tenderness. There is no right CVA tenderness, left CVA tenderness, guarding or rebound.  Musculoskeletal:        General: No swelling or tenderness. Normal range of motion.     Cervical back: Normal range of motion. No rigidity or tenderness.     Right lower leg: No edema.     Left lower leg: No edema.  Lymphadenopathy:     Cervical: No cervical adenopathy.  Skin:    General: Skin is warm and dry.     Coloration: Skin is not pale.     Findings: No bruising, erythema, lesion or rash.  Neurological:     Mental Status: She is alert and oriented to person, place, and time.     Cranial Nerves: No cranial nerve deficit.     Sensory: No sensory deficit.     Motor: No weakness.     Coordination: Coordination normal.     Gait: Gait normal.  Psychiatric:        Mood and Affect: Mood normal.        Speech: Speech normal.        Behavior: Behavior normal.        Thought Content: Thought content normal.        Judgment: Judgment normal.     Labs reviewed: Recent Labs    01/16/21 0813 07/24/21 0807  NA 138 136  K 4.8 5.0  CL 106 105  CO2 23 22  GLUCOSE 96 103*  BUN 19 16  CREATININE 1.26* 1.44*  CALCIUM 9.0 9.4   Recent Labs  01/16/21 0813 07/24/21 0807  AST 18 25  ALT 12 17  BILITOT 0.4 0.4  PROT 6.7 6.8   Recent Labs    01/16/21 0813 07/24/21 0807  WBC 6.3 7.2   NEUTROABS 3,377 3,730  HGB 12.0 12.2  HCT 36.6 37.6  MCV 88.6 87.6  PLT 363 336   Lab Results  Component Value Date   TSH 0.79 01/16/2021   Lab Results  Component Value Date   HGBA1C 5.9 (H) 07/24/2021   Lab Results  Component Value Date   CHOL 220 (H) 06/29/2018   HDL 134 06/29/2018   LDLCALC 69 06/29/2018   TRIG 90 06/29/2018   CHOLHDL 1.6 06/29/2018    Significant Diagnostic Results in last 30 days:  DG Foot Complete Left  Result Date: 09/03/2021 CLINICAL DATA:  Left foot pain. EXAM: LEFT FOOT - COMPLETE 3+ VIEW COMPARISON:  None Available. FINDINGS: There is no acute fracture or dislocation. The bones are osteopenic. The soft tissues are unremarkable. IMPRESSION: No acute fracture or dislocation. Electronically Signed   By: Anner Crete M.D.   On: 09/03/2021 03:02    Assessment/Plan  1. Dysesthesia of scalp Was following up with the dermatologist whom she is seeing has retired would like another referral to dermatologist at [facility]. - gabapentin (NEURONTIN) 300 MG capsule; TAKE 1 CAPSULE(300 MG) BY MOUTH AT BEDTIME  Dispense: 90 capsule; Refill: 0 - Ambulatory referral to Dermatology  2. Gastroesophageal reflux disease without esophagitis Had large amounts of phlegm last night despite taking omeprazole. We will increase omeprazole to twice daily if still ineffective consider switching to Protonix and follow-up with gastroenterologist. - omeprazole (PRILOSEC) 20 MG capsule; Take 1 capsule (20 mg total) by mouth 2 (two) times daily before a meal.  Dispense: 90 capsule; Refill: 3  3. Fatigue, unspecified type Suspect due to recent episode of diarrhea -Encourage to advance diet from bland to regular as tolerated -Encouraged to increase fluid intake  4. Diarrhea, unspecified type Resolved -Advance diet as tolerated -Increase fluid as above  Family/ staff Communication: Reviewed plan of care with patient verbalized understanding  Labs/tests ordered: None    Next Appointment: Return if symptoms worsen or fail to improve.   Sandrea Hughs, NP

## 2021-09-26 ENCOUNTER — Telehealth: Payer: Self-pay | Admitting: Family

## 2021-09-26 NOTE — Telephone Encounter (Signed)
Please verify with referral Coordinator looks like referral was send to [name] but patient requested [name] Dermat[facility].

## 2021-09-26 NOTE — Telephone Encounter (Signed)
Message sent to [facility], Referrals, regarding Referral Concerns. Awaiting Response.

## 2021-09-26 NOTE — Addendum Note (Signed)
Addended byMarlowe Sax C on: 09/26/2021 11:51 AM   Modules accepted: Orders

## 2021-09-30 NOTE — Telephone Encounter (Signed)
Message from [place], [place] i am off but have teams on my phone. So [facility] doesn't excise I was told by my coworkers that [facility] also did dermatology

## 2021-09-30 NOTE — Telephone Encounter (Signed)
Yes, [name], Referral Coordinator states that they also do Dermatology.

## 2021-09-30 NOTE — Telephone Encounter (Signed)
Awesome! Thanks

## 2021-09-30 NOTE — Telephone Encounter (Signed)
She can be seen by [facility]?

## 2021-10-09 DIAGNOSIS — H353132 Nonexudative age-related macular degeneration, bilateral, intermediate dry stage: Secondary | ICD-10-CM | POA: Diagnosis not present

## 2021-10-09 DIAGNOSIS — Z961 Presence of intraocular lens: Secondary | ICD-10-CM | POA: Diagnosis not present

## 2021-11-04 DIAGNOSIS — H353134 Nonexudative age-related macular degeneration, bilateral, advanced atrophic with subfoveal involvement: Secondary | ICD-10-CM | POA: Diagnosis not present

## 2021-11-04 DIAGNOSIS — H35033 Hypertensive retinopathy, bilateral: Secondary | ICD-10-CM | POA: Diagnosis not present

## 2021-11-04 DIAGNOSIS — H35372 Puckering of macula, left eye: Secondary | ICD-10-CM | POA: Diagnosis not present

## 2021-11-04 DIAGNOSIS — H43813 Vitreous degeneration, bilateral: Secondary | ICD-10-CM | POA: Diagnosis not present

## 2021-11-27 ENCOUNTER — Other Ambulatory Visit: Payer: Self-pay | Admitting: Nurse Practitioner

## 2021-11-27 DIAGNOSIS — I10 Essential (primary) hypertension: Secondary | ICD-10-CM

## 2021-12-17 ENCOUNTER — Ambulatory Visit: Payer: Medicare Other | Admitting: Dermatology

## 2021-12-23 DIAGNOSIS — R300 Dysuria: Secondary | ICD-10-CM | POA: Diagnosis not present

## 2021-12-29 DIAGNOSIS — R300 Dysuria: Secondary | ICD-10-CM | POA: Diagnosis not present

## 2022-01-27 ENCOUNTER — Other Ambulatory Visit: Payer: Self-pay | Admitting: Nurse Practitioner

## 2022-01-27 DIAGNOSIS — I10 Essential (primary) hypertension: Secondary | ICD-10-CM

## 2022-01-29 ENCOUNTER — Encounter: Payer: Self-pay | Admitting: Family

## 2022-01-29 ENCOUNTER — Ambulatory Visit (INDEPENDENT_AMBULATORY_CARE_PROVIDER_SITE_OTHER): Payer: Medicare Other | Admitting: Family

## 2022-01-29 VITALS — BP 110/80 | HR 66 | Temp 97.3°F | Resp 16 | Ht 64.0 in | Wt 102.0 lb

## 2022-01-29 DIAGNOSIS — B3789 Other sites of candidiasis: Secondary | ICD-10-CM

## 2022-01-29 MED ORDER — NYSTATIN 100000 UNIT/GM EX CREA: 1.0000 | TOPICAL_CREAM | Freq: Two times a day (BID) | CUTANEOUS | 3 refills | Status: DC

## 2022-01-29 NOTE — Progress Notes (Signed)
Provider: Bennett Vanscyoc FNP-C  Lauree Chandler, NP  Patient Care Team: Lauree Chandler, NP as PCP - General (Geriatric Medicine) Rutherford Guys, MD as Consulting Physician (Ophthalmology) Vania Rea, MD as Consulting Physician (Obstetrics and Gynecology) Lavonna Monarch, MD (Inactive) as Consulting Physician (Dermatology)  Extended Emergency Contact Information Primary Emergency Contact: [place] Phone: [phone] Relation: Friend Secondary Emergency Contact: Jeanella Flattery Mobile Phone: [phone] Relation: Significant other  Code Status:  Full Code  Goals of care: Advanced Directive information    01/29/2022   10:12 AM  Advanced Directives  Does Patient Have a Medical Advance Directive? No  Would patient like information on creating a medical advance directive? No - Patient declined     Chief Complaint  Patient presents with   Acute Visit    Patient complains of anus pain.     HPI:  Pt is a 86 y.o. female seen today for an acute visit for evaluation of anus pain x couple of days.describes pain as " scolding".Pain is intermittent.No worsening with bowel movement. Spreading to buttock.has been applying ice pack.states no rash but has had redness from vaginal area.No fever or chills. She is incontinent for urine wears adult size pull ups.tries to change when wet.    Past Medical History:  Diagnosis Date   Age-related macular degeneration, dry, both eyes    Alcoholism ([place])    Anemia    Anxiety    Cholelithiasis    Endometriosis    Goiter    Herpes simplex    Hyperlipidemia    Hypertension    Loss of weight    Nontoxic multinodular goiter    Other abnormal blood chemistry    PUD (peptic ulcer disease)    Renal cyst    Sciatica    Scoliosis (and kyphoscoliosis), idiopathic    Unspecified disorder of kidney and ureter    Urinary frequency    Past Surgical History:  Procedure Laterality Date   ABDOMINAL  HYSTERECTOMY  1971   CATARACT EXTRACTION W/ INTRAOCULAR LENS  IMPLANT, BILATERAL Bilateral 2016   Dr. Rutherford Guys   IRRIGATION AND DEBRIDEMENT ABSCESS Left 02/23/2017   Procedure: IRRIGATION AND DEBRIDEMENT ABSCESS;  Surgeon: Mcarthur Rossetti, MD;  Location: [facility];  Service: Orthopedics;  Laterality: Left;   REPAIR OF PERFORATED ULCER  1973 &1975   Dr Vida Rigger   STOMACH SURGERY  [phone]   removed 1/2 stomach; "had a total of 5 ORs from 1973-1980; all related to perforated ulcer"   TONSILLECTOMY      Allergies  Allergen Reactions   Cymbalta [Duloxetine Hcl] Diarrhea    Outpatient Encounter Medications as of 01/29/2022  Medication Sig   acyclovir ointment (ZOVIRAX) 5 % Apply topically.   amLODipine-valsartan (EXFORGE) 10-320 MG tablet TAKE 1 TABLET BY MOUTH EVERY DAY   CRANBERRY PO Take by mouth.   gabapentin (NEURONTIN) 300 MG capsule TAKE 1 CAPSULE(300 MG) BY MOUTH AT BEDTIME   GEMTESA 75 MG TABS Take 1 tablet by mouth daily.   LORazepam (ATIVAN) 0.5 MG tablet TAKE 1 TABLET(0.5 MG) BY MOUTH TWICE DAILY   Multiple Vitamins-Minerals (PRESERVISION AREDS 2 PO) Take by mouth daily.   nebivolol (BYSTOLIC) 10 MG tablet TAKE 1/2 TABLET(5 MG) BY MOUTH DAILY   omeprazole (PRILOSEC) 20 MG capsule Take 1 capsule (20 mg total) by mouth 2 (two) times daily before a meal.   PREMARIN vaginal cream Place 0.5 g vaginally 2 (two) times a week.   Sennosides (SENNA-LAX PO)  Take 1 tablet by mouth daily as needed.   triamcinolone cream (KENALOG) 0.1 % Apply 1 Application topically 2 (two) times daily.   No facility-administered encounter medications on file as of 01/29/2022.    Review of Systems  Constitutional:  Negative for chills, fatigue and fever.  Genitourinary:  Negative for urgency, vaginal bleeding, vaginal discharge and vaginal pain.       Incontinent for urine wears adult size pull ups   Skin:  Negative for color change, pallor and rash.    Immunization History  Administered  Date(s) Administered   PFIZER Comirnaty(Gray Top)Covid-19 Tri-Sucrose Vaccine 11/04/2020   PFIZER(Purple Top)SARS-COV-2 Vaccination 03/30/2019, 04/24/2019, 02/05/2020   Zoster Recombinat (Shingrix) 09/30/2020, 12/02/2020   There are no preventive care reminders to display for this patient.    06/23/2021    2:27 PM 07/28/2021    2:28 PM 09/02/2021    1:18 PM 09/16/2021    1:52 PM 01/29/2022   10:12 AM  Fall Risk  Falls in the past year? 0 0 0 0 0  Was there an injury with Fall? 0 0 0 0 0  Fall Risk Category Calculator 0 0 0 0 0  Fall Risk Category Low Low Low Low Low  Patient Fall Risk Level Low fall risk Low fall risk Low fall risk Low fall risk Low fall risk  Patient at Risk for Falls Due to No Fall Risks No Fall Risks No Fall Risks No Fall Risks No Fall Risks  Fall risk Follow up Falls evaluation completed Falls evaluation completed Falls evaluation completed Falls evaluation completed Falls evaluation completed   Functional Status Survey:    Vitals:   01/29/22 1011  BP: 110/80  Pulse: 66  Resp: 16  Temp: (!) 97.3 F (36.3 C)  SpO2: 98%  Weight: 102 lb (46.3 kg)  Height: '5\' 4"'$  (1.626 m)   Body mass index is 17.51 kg/m. Physical Exam Vitals reviewed. Exam conducted with a chaperone present (Melissa Pickett,CMA).  Constitutional:      General: She is not in acute distress.    Appearance: Normal appearance. She is normal weight. She is not ill-appearing or diaphoretic.  HENT:     Head: Normocephalic.     Mouth/Throat:     Mouth: Mucous membranes are moist.     Pharynx: Oropharynx is clear. No oropharyngeal exudate or posterior oropharyngeal erythema.  Eyes:     General: No scleral icterus.       Right eye: No discharge.        Left eye: No discharge.     Conjunctiva/sclera: Conjunctivae normal.     Pupils: Pupils are equal, round, and reactive to light.  Neck:     Vascular: No carotid bruit.  Cardiovascular:     Rate and Rhythm: Normal rate and regular rhythm.      Pulses: Normal pulses.     Heart sounds: Normal heart sounds. No murmur heard.    No friction rub. No gallop.  Pulmonary:     Effort: Pulmonary effort is normal. No respiratory distress.     Breath sounds: Normal breath sounds. No wheezing, rhonchi or rales.  Chest:     Chest wall: No tenderness.  Abdominal:     General: Bowel sounds are normal. There is no distension.     Palpations: Abdomen is soft. There is no mass.     Tenderness: There is no abdominal tenderness. There is no right CVA tenderness, left CVA tenderness, guarding or rebound.  Genitourinary:    General:  Normal vulva.     Vagina: No vaginal discharge.  Musculoskeletal:     Cervical back: Normal range of motion. No rigidity or tenderness.  Lymphadenopathy:     Cervical: No cervical adenopathy.  Skin:    General: Skin is warm and dry.     Coloration: Skin is not pale.     Findings: No bruising, erythema, lesion or rash.     Comments: Peri-area and anal area beefy redness noted.No rash or ulceration.   Neurological:     Mental Status: She is alert and oriented to person, place, and time.     Cranial Nerves: No cranial nerve deficit.     Sensory: No sensory deficit.     Motor: No weakness.     Coordination: Coordination normal.     Gait: Gait normal.  Psychiatric:        Mood and Affect: Mood normal.        Speech: Speech normal.        Behavior: Behavior normal.        Thought Content: Thought content normal.        Judgment: Judgment normal.    Labs reviewed: Recent Labs    07/24/21 0807  NA 136  K 5.0  CL 105  CO2 22  GLUCOSE 103*  BUN 16  CREATININE 1.44*  CALCIUM 9.4   Recent Labs    07/24/21 0807  AST 25  ALT 17  BILITOT 0.4  PROT 6.8   Recent Labs    07/24/21 0807  WBC 7.2  NEUTROABS 3,730  HGB 12.2  HCT 37.6  MCV 87.6  PLT 336   Lab Results  Component Value Date   TSH 0.79 01/16/2021   Lab Results  Component Value Date   HGBA1C 5.9 (H) 07/24/2021   Lab Results   Component Value Date   CHOL 220 (H) 06/29/2018   HDL 134 06/29/2018   LDLCALC 69 06/29/2018   TRIG 90 06/29/2018   CHOLHDL 1.6 06/29/2018    Significant Diagnostic Results in last 30 days:  No results found.  Assessment/Plan  Candidiasis of anus Beefy redness around anal area and peri-area  - advised to change pull ups promptly whenever it wet. - apply Nystatin as below until resolved - nystatin cream (MYCOSTATIN); Apply 1 Application topically 2 (two) times daily. Apply to affected areas on peri-area  Dispense: 30 g; Refill: 3  Family/ staff Communication: Reviewed plan of care with patient verbalized understanding   Labs/tests ordered: None   Next Appointment: Return if symptoms worsen or fail to improve.   Sandrea Hughs, NP

## 2022-02-02 ENCOUNTER — Ambulatory Visit (INDEPENDENT_AMBULATORY_CARE_PROVIDER_SITE_OTHER): Payer: Medicare Other | Admitting: Nurse Practitioner

## 2022-02-02 ENCOUNTER — Encounter: Payer: Self-pay | Admitting: Nurse Practitioner

## 2022-02-02 VITALS — BP 128/84 | HR 66 | Temp 97.9°F | Ht 64.0 in | Wt 101.6 lb

## 2022-02-02 DIAGNOSIS — I10 Essential (primary) hypertension: Secondary | ICD-10-CM | POA: Diagnosis not present

## 2022-02-02 DIAGNOSIS — R208 Other disturbances of skin sensation: Secondary | ICD-10-CM | POA: Diagnosis not present

## 2022-02-02 DIAGNOSIS — N3281 Overactive bladder: Secondary | ICD-10-CM | POA: Diagnosis not present

## 2022-02-02 DIAGNOSIS — R739 Hyperglycemia, unspecified: Secondary | ICD-10-CM | POA: Diagnosis not present

## 2022-02-02 DIAGNOSIS — D508 Other iron deficiency anemias: Secondary | ICD-10-CM

## 2022-02-02 DIAGNOSIS — B379 Candidiasis, unspecified: Secondary | ICD-10-CM | POA: Diagnosis not present

## 2022-02-02 DIAGNOSIS — N1832 Chronic kidney disease, stage 3b: Secondary | ICD-10-CM | POA: Diagnosis not present

## 2022-02-02 MED ORDER — FLUCONAZOLE 150 MG PO TABS: ORAL_TABLET | ORAL | 0 refills | Status: DC

## 2022-02-02 NOTE — Patient Instructions (Addendum)
To make appt to come back for labs  miconazole cream- for vaginal yeast

## 2022-02-02 NOTE — Progress Notes (Signed)
Careteam: Patient Care Team: Lauree Chandler, NP as PCP - General (Geriatric Medicine) Rutherford Guys, MD as Consulting Physician (Ophthalmology) Vania Rea, MD as Consulting Physician (Obstetrics and Gynecology) Lavonna Monarch, MD (Inactive) as Consulting Physician (Dermatology)  PLACE OF SERVICE:  [place] Directive information Does Patient Have a Medical Advance Directive?: Yes, Type of Advance Directive: [place];Living will, Does patient want to make changes to medical advance directive?: No - Patient declined  Allergies  Allergen Reactions   Cymbalta [Duloxetine Hcl] Diarrhea    Chief Complaint  Patient presents with   Medical Management of Chronic Issues    6 month follow-up. Discuss need for pneumonia vaccine, td/tdap, and covid boosters. NCIR verified. Discuss nystatin cream alternatives.      HPI: Patient is a 86 y.o. female for routine follow up.   Saw Dinah last week due to yeast around anus and was prescribed nystatin but has not seen much improvement.  Now in vaginal area as well.   She is has incontinence of urine- continues to have yeast   Continues to have OAB- gemtesa   Declines pneumonia vaccine   Continues on gabapentin for itching  Htn- controlled on current regimen.  Review of Systems:  Review of Systems  Constitutional:  Negative for chills, fever and weight loss.  HENT:  Negative for tinnitus.   Respiratory:  Negative for cough, sputum production and shortness of breath.   Cardiovascular:  Negative for chest pain, palpitations and leg swelling.  Gastrointestinal:  Negative for abdominal pain, constipation, diarrhea and heartburn.  Genitourinary:  Negative for dysuria, frequency and urgency.  Musculoskeletal:  Negative for back pain, falls, joint pain and myalgias.  Skin: Negative.   Neurological:  Negative for dizziness and headaches.  Psychiatric/Behavioral:  Negative for depression and memory loss.  The patient does not have insomnia.     Past Medical History:  Diagnosis Date   Age-related macular degeneration, dry, both eyes    Alcoholism ([place])    Anemia    Anxiety    Cholelithiasis    Endometriosis    Goiter    Herpes simplex    Hyperlipidemia    Hypertension    Loss of weight    Nontoxic multinodular goiter    Other abnormal blood chemistry    PUD (peptic ulcer disease)    Renal cyst    Sciatica    Scoliosis (and kyphoscoliosis), idiopathic    Unspecified disorder of kidney and ureter    Urinary frequency    Past Surgical History:  Procedure Laterality Date   ABDOMINAL HYSTERECTOMY  1971   CATARACT EXTRACTION W/ INTRAOCULAR LENS  IMPLANT, BILATERAL Bilateral 2016   Dr. Rutherford Guys   IRRIGATION AND DEBRIDEMENT ABSCESS Left 02/23/2017   Procedure: IRRIGATION AND DEBRIDEMENT ABSCESS;  Surgeon: Mcarthur Rossetti, MD;  Location: [facility];  Service: Orthopedics;  Laterality: Left;   REPAIR OF PERFORATED ULCER  1973 &1975   Dr Vida Rigger   STOMACH SURGERY  [phone]   removed 1/2 stomach; "had a total of 5 ORs from 1973-1980; all related to perforated ulcer"   TONSILLECTOMY     Social History:   reports that she quit smoking about 28 years ago. Her smoking use included cigarettes. She has a 40.00 pack-year smoking history. She has never used smokeless tobacco. She reports that she does not drink alcohol and does not use drugs.  Family History  Problem Relation Age of Onset   Obesity Mother  Neuropathy Neg Hx    Colon cancer Neg Hx    Esophageal cancer Neg Hx    Pancreatic cancer Neg Hx    Stomach cancer Neg Hx     Medications: Patient's Medications  New Prescriptions   No medications on file  Previous Medications   ACYCLOVIR OINTMENT (ZOVIRAX) 5 %    Apply topically.   AMLODIPINE-VALSARTAN (EXFORGE) 10-320 MG TABLET    TAKE 1 TABLET BY MOUTH EVERY DAY   CRANBERRY PO    Take by mouth.   GABAPENTIN (NEURONTIN) 300 MG CAPSULE    TAKE 1 CAPSULE(300 MG) BY MOUTH  AT BEDTIME   GEMTESA 75 MG TABS    Take 1 tablet by mouth daily.   LORAZEPAM (ATIVAN) 0.5 MG TABLET    TAKE 1 TABLET(0.5 MG) BY MOUTH TWICE DAILY   MULTIPLE VITAMINS-MINERALS (PRESERVISION AREDS 2 PO)    Take by mouth daily.   NEBIVOLOL (BYSTOLIC) 10 MG TABLET    TAKE 1/2 TABLET(5 MG) BY MOUTH DAILY   NYSTATIN CREAM (MYCOSTATIN)    Apply 1 Application topically 2 (two) times daily. Apply to affected areas on peri-area   OMEPRAZOLE (PRILOSEC) 20 MG CAPSULE    Take 1 capsule (20 mg total) by mouth 2 (two) times daily before a meal.   PREMARIN VAGINAL CREAM    Place 0.5 g vaginally 2 (two) times a week.   SENNOSIDES (SENNA-LAX PO)    Take 1 tablet by mouth daily as needed.   TRIAMCINOLONE CREAM (KENALOG) 0.1 %    Apply 1 Application topically 2 (two) times daily.  Modified Medications   No medications on file  Discontinued Medications   No medications on file    Physical Exam:  Vitals:   02/02/22 1410  BP: 128/84  Pulse: 66  Temp: 97.9 F (36.6 C)  TempSrc: Temporal  SpO2: 99%  Weight: 101 lb 9.6 oz (46.1 kg)  Height: '5\' 4"'$  (1.626 m)   Body mass index is 17.44 kg/m. Wt Readings from Last 3 Encounters:  02/02/22 101 lb 9.6 oz (46.1 kg)  01/29/22 102 lb (46.3 kg)  09/16/21 104 lb 9.6 oz (47.4 kg)    Physical Exam Constitutional:      General: She is not in acute distress.    Appearance: She is well-developed. She is not diaphoretic.  HENT:     Head: Normocephalic and atraumatic.     Mouth/Throat:     Pharynx: No oropharyngeal exudate.  Eyes:     Conjunctiva/sclera: Conjunctivae normal.     Pupils: Pupils are equal, round, and reactive to light.  Cardiovascular:     Rate and Rhythm: Normal rate and regular rhythm.     Heart sounds: Normal heart sounds.  Pulmonary:     Effort: Pulmonary effort is normal.     Breath sounds: Normal breath sounds.  Abdominal:     General: Bowel sounds are normal.     Palpations: Abdomen is soft.  Musculoskeletal:     Cervical back:  Normal range of motion and neck supple.     Right lower leg: No edema.     Left lower leg: No edema.  Skin:    General: Skin is warm and dry.  Neurological:     Mental Status: She is alert.  Psychiatric:        Mood and Affect: Mood normal.     Labs reviewed: Basic Metabolic Panel: Recent Labs    07/24/21 0807  NA 136  K 5.0  CL 105  CO2 22  GLUCOSE 103*  BUN 16  CREATININE 1.44*  CALCIUM 9.4   Liver Function Tests: Recent Labs    07/24/21 0807  AST 25  ALT 17  BILITOT 0.4  PROT 6.8   No results for input(s): "LIPASE", "AMYLASE" in the last 8760 hours. No results for input(s): "AMMONIA" in the last 8760 hours. CBC: Recent Labs    07/24/21 0807  WBC 7.2  NEUTROABS 3,730  HGB 12.2  HCT 37.6  MCV 87.6  PLT 336   Lipid Panel: No results for input(s): "CHOL", "HDL", "LDLCALC", "TRIG", "CHOLHDL", "LDLDIRECT" in the last 8760 hours. TSH: No results for input(s): "TSH" in the last 8760 hours. A1C: Lab Results  Component Value Date   HGBA1C 5.9 (H) 07/24/2021     Assessment/Plan 1. Yeast infection - fluconazole (DIFLUCAN) 150 MG tablet; Take 1 tablet today and repeat in 3 days  Dispense: 2 tablet; Refill: 0  2. Chronic kidney disease (CKD) stage G3b/A1, moderately decreased glomerular filtration rate (GFR) between 30-44 mL/min/1.73 square meter and albuminuria creatinine ratio less than 30 mg/g (HCC) -Chronic and stable Encourage proper hydration Follow metabolic panel Avoid nephrotoxic meds (NSAIDS)  3. Iron deficiency anemia secondary to inadequate dietary iron intake -no longer on supplements, will follow up lab  4. Essential hypertension, benign -Blood pressure well controlled, goal bp <140/90 Continue current medications and dietary modifications follow metabolic panel - COMPLETE METABOLIC PANEL WITH GFR - CBC with Differential/Platelet  5. Hyperglycemia -continues with dietary modifications - Hemoglobin A1c  6. Dysesthesia of  scalp -stable on gabapentin  7. Overactive bladder -ongoing, continues on gemesta    Return in about 6 months (around 08/04/2022) for routine follow up. Carlos American. [place], [facility] [phone]

## 2022-02-03 ENCOUNTER — Other Ambulatory Visit: Payer: Self-pay | Admitting: Nurse Practitioner

## 2022-02-03 ENCOUNTER — Other Ambulatory Visit: Payer: Medicare Other

## 2022-02-03 DIAGNOSIS — F419 Anxiety disorder, unspecified: Secondary | ICD-10-CM

## 2022-02-03 DIAGNOSIS — I10 Essential (primary) hypertension: Secondary | ICD-10-CM | POA: Diagnosis not present

## 2022-02-03 DIAGNOSIS — R739 Hyperglycemia, unspecified: Secondary | ICD-10-CM | POA: Diagnosis not present

## 2022-02-03 NOTE — Telephone Encounter (Signed)
Patient is requesting a refill of the following medications: Requested Prescriptions   Pending Prescriptions Disp Refills   LORazepam (ATIVAN) 0.5 MG tablet [Pharmacy Med Name: LORAZEPAM 0.'5MG'$  TABLETS] 60 tablet     Sig: TAKE 1 TABLET(0.5 MG) BY MOUTH TWICE DAILY    Date of last refill: 08/08/2021  Refill amount: 30/5   Treatment agreement date: 07/25/2021

## 2022-02-04 LAB — CBC WITH DIFFERENTIAL/PLATELET
Absolute Monocytes: 614 cells/uL (ref 200–950)
Basophils Absolute: 74 cells/uL (ref 0–200)
Basophils Relative: 1 %
Eosinophils Absolute: 148 cells/uL (ref 15–500)
Eosinophils Relative: 2 %
HCT: 35.5 % (ref 35.0–45.0)
Hemoglobin: 11.7 g/dL (ref 11.7–15.5)
Lymphs Abs: 1931 cells/uL (ref 850–3900)
MCH: 28.8 pg (ref 27.0–33.0)
MCHC: 33 g/dL (ref 32.0–36.0)
MCV: 87.4 fL (ref 80.0–100.0)
MPV: 10.5 fL (ref 7.5–12.5)
Monocytes Relative: 8.3 %
Neutro Abs: 4632 cells/uL (ref 1500–7800)
Neutrophils Relative %: 62.6 %
Platelets: 289 10*3/uL (ref 140–400)
RBC: 4.06 10*6/uL (ref 3.80–5.10)
RDW: 13.7 % (ref 11.0–15.0)
Total Lymphocyte: 26.1 %
WBC: 7.4 10*3/uL (ref 3.8–10.8)

## 2022-02-04 LAB — COMPLETE METABOLIC PANEL WITH GFR
AG Ratio: 1.9 (calc) (ref 1.0–2.5)
ALT: 19 U/L (ref 6–29)
AST: 23 U/L (ref 10–35)
Albumin: 4.3 g/dL (ref 3.6–5.1)
Alkaline phosphatase (APISO): 86 U/L (ref 37–153)
BUN/Creatinine Ratio: 12 (calc) (ref 6–22)
BUN: 19 mg/dL (ref 7–25)
CO2: 19 mmol/L — ABNORMAL LOW (ref 20–32)
Calcium: 9.1 mg/dL (ref 8.6–10.4)
Chloride: 109 mmol/L (ref 98–110)
Creat: 1.53 mg/dL — ABNORMAL HIGH (ref 0.60–0.95)
Globulin: 2.3 g/dL (calc) (ref 1.9–3.7)
Glucose, Bld: 113 mg/dL — ABNORMAL HIGH (ref 65–99)
Potassium: 4.1 mmol/L (ref 3.5–5.3)
Sodium: 138 mmol/L (ref 135–146)
Total Bilirubin: 0.4 mg/dL (ref 0.2–1.2)
Total Protein: 6.6 g/dL (ref 6.1–8.1)
eGFR: 33 mL/min/{1.73_m2} — ABNORMAL LOW (ref >=60)

## 2022-02-04 LAB — HEMOGLOBIN A1C
Hgb A1c MFr Bld: 5.9 % of total Hgb — ABNORMAL HIGH (ref <5.7)
Mean Plasma Glucose: 123 mg/dL
eAG (mmol/L): 6.8 mmol/L

## 2022-03-04 ENCOUNTER — Other Ambulatory Visit: Payer: Self-pay | Admitting: Family

## 2022-03-04 DIAGNOSIS — R208 Other disturbances of skin sensation: Secondary | ICD-10-CM

## 2022-03-13 DIAGNOSIS — R8271 Bacteriuria: Secondary | ICD-10-CM | POA: Diagnosis not present

## 2022-03-18 ENCOUNTER — Other Ambulatory Visit: Payer: Self-pay | Admitting: Family

## 2022-03-18 DIAGNOSIS — K219 Gastro-esophageal reflux disease without esophagitis: Secondary | ICD-10-CM

## 2022-03-26 ENCOUNTER — Telehealth: Payer: Self-pay | Admitting: *Deleted

## 2022-03-26 NOTE — Telephone Encounter (Signed)
Patient called and left message on Clinical intake stating that she has been told that she was exposed to Covid. Patient is wanting to know if we have Covid test that we can give her. Stated that she went to the pharmacy and they wanted to charge her $25.   Tried calling patient back and LMOM to return call.

## 2022-03-26 NOTE — Telephone Encounter (Signed)
Patient called back. Informed her that we didn't have any that we give patient's but the pharmacy should file through insurance for them to be covered. She stated that they did file it but it wasn't covered and was going to be $25.   I offered her an appointment to come to office to have one done and she refused and stated that she is not showing no symptoms but if anything changes she would give [place] a call.

## 2022-04-13 ENCOUNTER — Telehealth (INDEPENDENT_AMBULATORY_CARE_PROVIDER_SITE_OTHER): Payer: 59 | Admitting: Orthopedic Surgery

## 2022-04-13 ENCOUNTER — Encounter: Payer: Self-pay | Admitting: Orthopedic Surgery

## 2022-04-13 DIAGNOSIS — H00015 Hordeolum externum left lower eyelid: Secondary | ICD-10-CM | POA: Diagnosis not present

## 2022-04-13 NOTE — Progress Notes (Signed)
Careteam: Patient Care Team: Kerri Chandler, NP as PCP - General (Geriatric Medicine) Kerri Guys, MD as Consulting Physician (Ophthalmology) Kerri Rea, MD as Consulting Physician (Obstetrics and Gynecology) Kerri Monarch, MD (Inactive) as Consulting Physician (Dermatology)  Seen by: [name], AGNP-C  PLACE OF SERVICE:  [facility] Directive information Does Patient Have a Medical Advance Directive?: Yes, Type of Advance Directive: Living will, Does patient want to make changes to medical advance directive?: No - Patient declined  Allergies  Allergen Reactions   Cymbalta [Duloxetine Hcl] Diarrhea    Chief Complaint  Patient presents with   Acute Visit    Patient complains of stye on left eye. Patient states it appeared yesterday 04/12/2022 around noon and it was painful.      HPI: Patient is a 87 y.o. female seen today for acute visit via virtual visit due to left eye pain.   02/25 she woke up with left lower eye lid pain. She also noticed a small tender bump to her eyelid. She has applied warm compress to left eye a few times and pain has improved. Denies eye pain, changes in vision or purulent drainage.   Review of Systems:  Review of Systems  Constitutional:  Negative for chills, fever and malaise/fatigue.  HENT:  Negative for congestion and sore throat.   Eyes:  Positive for pain. Negative for blurred vision, discharge and redness.  Respiratory:  Negative for cough, shortness of breath and wheezing.   Cardiovascular:  Negative for chest pain and leg swelling.  Psychiatric/Behavioral:  Negative for depression. The patient is not nervous/anxious.     Past Medical History:  Diagnosis Date   Age-related macular degeneration, dry, both eyes    Alcoholism ([place])    Anemia    Anxiety    Cholelithiasis    Endometriosis    Goiter    Herpes simplex    Hyperlipidemia    Hypertension    Loss of weight    Nontoxic multinodular goiter    Other  abnormal blood chemistry    PUD (peptic ulcer disease)    Renal cyst    Sciatica    Scoliosis (and kyphoscoliosis), idiopathic    Unspecified disorder of kidney and ureter    Urinary frequency    Past Surgical History:  Procedure Laterality Date   ABDOMINAL HYSTERECTOMY  1971   CATARACT EXTRACTION W/ INTRAOCULAR LENS  IMPLANT, BILATERAL Bilateral 2016   Kerri. Rutherford [name]   IRRIGATION AND DEBRIDEMENT ABSCESS Left 02/23/2017   Procedure: IRRIGATION AND DEBRIDEMENT ABSCESS;  Surgeon: Kerri Rossetti, MD;  Location: [facility];  Service: Orthopedics;  Laterality: Left;   REPAIR OF PERFORATED ULCER  1973 &1975   [name]   STOMACH SURGERY  [phone]   removed 1/2 stomach; "had a total of 5 ORs from 1973-1980; all related to perforated ulcer"   TONSILLECTOMY     Social History:   reports that she quit smoking about 28 years ago. Her smoking use included cigarettes. She has a 40.00 pack-year smoking history. She has never used smokeless tobacco. She reports that she does not drink alcohol and does not use drugs.  Family History  Problem Relation Age of Onset   Obesity Mother    Neuropathy Neg Hx    Colon cancer Neg Hx    Esophageal cancer Neg Hx    Pancreatic cancer Neg Hx    Stomach cancer Neg Hx     Medications: Patient's Medications  New Prescriptions  No medications on file  Previous Medications   ACYCLOVIR OINTMENT (ZOVIRAX) 5 %    Apply 1 Application topically as needed.   AMLODIPINE-VALSARTAN (EXFORGE) 10-320 MG TABLET    TAKE 1 TABLET BY MOUTH EVERY DAY   CRANBERRY PO    Take by mouth.   GABAPENTIN (NEURONTIN) 300 MG CAPSULE    TAKE 1 CAPSULE(300 MG) BY MOUTH AT BEDTIME   GEMTESA 75 MG TABS    Take 1 tablet by mouth daily.   LORAZEPAM (ATIVAN) 0.5 MG TABLET    TAKE 1 TABLET(0.5 MG) BY MOUTH TWICE DAILY   MULTIPLE VITAMINS-MINERALS (PRESERVISION AREDS 2 PO)    Take by mouth daily.   NEBIVOLOL (BYSTOLIC) 10 MG TABLET    TAKE 1/2 TABLET(5 MG) BY MOUTH DAILY   OMEPRAZOLE  (PRILOSEC) 20 MG CAPSULE    TAKE 1 CAPSULE(20 MG) BY MOUTH TWICE DAILY BEFORE A MEAL   TRIAMCINOLONE CREAM (KENALOG) 0.1 %    Apply 1 Application topically 2 (two) times daily.  Modified Medications   No medications on file  Discontinued Medications   FLUCONAZOLE (DIFLUCAN) 150 MG TABLET    Take 1 tablet today and repeat in 3 days   NYSTATIN CREAM (MYCOSTATIN)    Apply 1 Application topically 2 (two) times daily. Apply to affected areas on peri-area   PREMARIN VAGINAL CREAM    Place 0.5 g vaginally 2 (two) times a week.   SENNOSIDES (SENNA-LAX PO)    Take 1 tablet by mouth daily as needed.    Physical Exam:  There were no vitals filed for this visit. There is no height or weight on file to calculate BMI. Wt Readings from Last 3 Encounters:  02/02/22 101 lb 9.6 oz (46.1 kg)  01/29/22 102 lb (46.3 kg)  09/16/21 104 lb 9.6 oz (47.4 kg)    Physical Exam Vitals (Exam limited due to virtual visit) reviewed.  Constitutional:      General: She is not in acute distress. HENT:     Head: Normocephalic.  Neurological:     Mental Status: She is alert.     Labs reviewed: Basic Metabolic Panel: Recent Labs    07/24/21 0807 02/03/22 1118  NA 136 138  K 5.0 4.1  CL 105 109  CO2 22 19*  GLUCOSE 103* 113*  BUN 16 19  CREATININE 1.44* 1.53*  CALCIUM 9.4 9.1   Liver Function Tests: Recent Labs    07/24/21 0807 02/03/22 1118  AST 25 23  ALT 17 19  BILITOT 0.4 0.4  PROT 6.8 6.6   No results for input(s): "LIPASE", "AMYLASE" in the last 8760 hours. No results for input(s): "AMMONIA" in the last 8760 hours. CBC: Recent Labs    07/24/21 0807 02/03/22 1118  WBC 7.2 7.4  NEUTROABS 3,730 4,632  HGB 12.2 11.7  HCT 37.6 35.5  MCV 87.6 87.4  PLT 336 289   Lipid Panel: No results for input(s): "CHOL", "HDL", "LDLCALC", "TRIG", "CHOLHDL", "LDLDIRECT" in the last 8760 hours. TSH: No results for input(s): "TSH" in the last 8760 hours. A1C: Lab Results  Component Value Date    HGBA1C 5.9 (H) 02/03/2022     Assessment/Plan 1. Hordeolum externum of left lower eyelid - 02/25 increased pain to left lower lid with small nodule - she has been applying warm compress> reports improved pain - continue warm compress 3-4 x/daily for 10-20 minute intervals - consider erythromycin ointment if no improvement  Virtual Visit   I connected with [name] and verified  that I am speaking with the correct person using two identifiers.  [facility] Patient: [name] Provider: Yvonna Alanis, NP    I discussed the limitations, risks, security and privacy concerns of performing an evaluation and management service by telephone and the availability of in person appointments. I also discussed with the patient that there may be a patient responsible charge related to this service. The patient expressed understanding and agreed to proceed.   I discussed the assessment and treatment plan with the patient. The patient was provided an opportunity to ask questions and all were answered. The patient agreed with the plan and demonstrated an understanding of the instructions.   The patient was advised to call back or seek an in-person evaluation if the symptoms worsen or if the condition fails to improve as anticipated.  I provided 6 minutes of face-to-face time during this encounter.  Aissata Wilmore Cleophas Dunker, [place] printed and mailed     Next appt: none Jayvier Burgher [place], [facility] [phone]

## 2022-04-13 NOTE — Progress Notes (Signed)
   This service is provided via telemedicine  No vital signs collected/recorded due to the encounter was a telemedicine visit.   Location of patient (ex: home, work):  Home  Patient consents to a telephone visit:  Yes  Location of the provider (ex: office, home):  [place]  Name of any referring provider:  Lauree Chandler, NP   Names of all persons participating in the telemedicine service and their role in the encounter:  Patient, [name], [place], [name], NP.    Time spent on call:  8 minutes spent on the phone with Medical Assistant.

## 2022-05-05 DIAGNOSIS — H43813 Vitreous degeneration, bilateral: Secondary | ICD-10-CM | POA: Diagnosis not present

## 2022-05-05 DIAGNOSIS — H35033 Hypertensive retinopathy, bilateral: Secondary | ICD-10-CM | POA: Diagnosis not present

## 2022-05-05 DIAGNOSIS — H353133 Nonexudative age-related macular degeneration, bilateral, advanced atrophic without subfoveal involvement: Secondary | ICD-10-CM | POA: Diagnosis not present

## 2022-05-05 DIAGNOSIS — H35372 Puckering of macula, left eye: Secondary | ICD-10-CM | POA: Diagnosis not present

## 2022-05-26 ENCOUNTER — Encounter: Payer: Medicare Other | Admitting: Nurse Practitioner

## 2022-06-03 DIAGNOSIS — R202 Paresthesia of skin: Secondary | ICD-10-CM | POA: Diagnosis not present

## 2022-06-03 DIAGNOSIS — L853 Xerosis cutis: Secondary | ICD-10-CM | POA: Diagnosis not present

## 2022-06-03 DIAGNOSIS — L218 Other seborrheic dermatitis: Secondary | ICD-10-CM | POA: Diagnosis not present

## 2022-06-15 ENCOUNTER — Encounter: Payer: 59 | Admitting: Nurse Practitioner

## 2022-06-16 NOTE — Progress Notes (Signed)
Err

## 2022-06-22 ENCOUNTER — Ambulatory Visit (INDEPENDENT_AMBULATORY_CARE_PROVIDER_SITE_OTHER): Payer: 59 | Admitting: Adult Health

## 2022-06-22 ENCOUNTER — Encounter: Payer: Self-pay | Admitting: Adult Health

## 2022-06-22 VITALS — BP 115/88 | HR 68 | Temp 96.6°F | Resp 18 | Ht 64.0 in | Wt 103.5 lb

## 2022-06-22 DIAGNOSIS — Z0000 Encounter for general adult medical examination without abnormal findings: Secondary | ICD-10-CM

## 2022-06-22 NOTE — Patient Instructions (Signed)
  [name] , Thank you for taking time to come for your Medicare Wellness Visit. I appreciate your ongoing commitment to your health goals. Please review the following plan we discussed and let me know if I can assist you in the future.   These are the goals we discussed:  Goals      DIET - INCREASE WATER INTAKE     - will use use a water bottle to measure how much water intake -   will put a sticky tab on the counter to remind her to take water -  will put water on the coffee table        This is a list of the screening recommended for you and due dates:  Health Maintenance  Topic Date Due   Pneumonia Vaccine (1 of 2 - PCV) 02/03/2023*   COVID-19 Vaccine (6 - 2023-24 season) 02/17/2023*   Medicare Annual Wellness Visit  06/22/2023   Zoster (Shingles) Vaccine  Completed   HPV Vaccine  Aged Out   DTaP/Tdap/Td vaccine  Discontinued  *Topic was postponed. The date shown is not the original due date.

## 2022-06-22 NOTE — Progress Notes (Addendum)
Subjective:   [name] is a 87 y.o. female who presents for Medicare Annual (Subsequent) preventive examination.  Review of Systems     Cardiac Risk Factors include: diabetes mellitus;hypertension     Objective:    Today's Vitals   06/22/22 1120  BP: 115/88  Pulse: 68  Resp: 18  Temp: (!) 96.6 F (35.9 C)  SpO2: 97%  Weight: 103 lb 8 oz (46.9 kg)  Height: 5\' 4"  (1.626 m)   Body mass index is 17.77 kg/m.     06/22/2022   11:30 AM 04/13/2022    2:17 PM 02/02/2022    2:15 PM 01/29/2022   10:12 AM 09/16/2021    1:53 PM 09/02/2021    1:18 PM 07/28/2021    2:28 PM  Advanced Directives  Does Patient Have a Medical Advance Directive? Yes Yes Yes No Yes Yes Yes  Type of Advance Directive Living will Living will Healthcare Power of [place];Living will  Living will Living will Living will  Does patient want to make changes to medical advance directive?  No - Patient declined No - Patient declined  No - Patient declined No - Patient declined No - Patient declined  Copy of Healthcare Power of Attorney in Chart?   Yes - validated most recent copy scanned in chart (See row information)      Would patient like information on creating a medical advance directive?    No - Patient declined       Current Medications (verified) Outpatient Encounter Medications as of 06/22/2022  Medication Sig   acyclovir ointment (ZOVIRAX) 5 % Apply 1 Application topically as needed.   amLODipine-valsartan (EXFORGE) 10-320 MG tablet TAKE 1 TABLET BY MOUTH EVERY DAY   CRANBERRY PO Take by mouth.   gabapentin (NEURONTIN) 300 MG capsule TAKE 1 CAPSULE(300 MG) BY MOUTH AT BEDTIME   GEMTESA 75 MG TABS Take 1 tablet by mouth daily.   LORazepam (ATIVAN) 0.5 MG tablet TAKE 1 TABLET(0.5 MG) BY MOUTH TWICE DAILY   Multiple Vitamins-Minerals (PRESERVISION AREDS 2 PO) Take by mouth daily.   nebivolol (BYSTOLIC) 10 MG tablet TAKE 1/2 TABLET(5 MG) BY MOUTH DAILY   omeprazole (PRILOSEC) 20 MG capsule TAKE 1 CAPSULE(20  MG) BY MOUTH TWICE DAILY BEFORE A MEAL   triamcinolone cream (KENALOG) 0.1 % Apply 1 Application topically 2 (two) times daily.   No facility-administered encounter medications on file as of 06/22/2022.    Allergies (verified) Cymbalta [duloxetine hcl]   History: Past Medical History:  Diagnosis Date   Age-related macular degeneration, dry, both eyes    Alcoholism ([facility])    Anemia    Anxiety    Cholelithiasis    Endometriosis    Goiter    Herpes simplex    Hyperlipidemia    Hypertension    Loss of weight    Nontoxic multinodular goiter    Other abnormal blood chemistry    PUD (peptic ulcer disease)    Renal cyst    Sciatica    Scoliosis (and kyphoscoliosis), idiopathic    Unspecified disorder of kidney and ureter    Urinary frequency    Past Surgical History:  Procedure Laterality Date   ABDOMINAL HYSTERECTOMY  1971   CATARACT EXTRACTION W/ INTRAOCULAR LENS  IMPLANT, BILATERAL Bilateral 2016   Dr. Jethro Bolus   IRRIGATION AND DEBRIDEMENT ABSCESS Left 02/23/2017   Procedure: IRRIGATION AND DEBRIDEMENT ABSCESS;  Surgeon: Kathryne Hitch, MD;  Location: [facility];  Service: Orthopedics;  Laterality: Left;  REPAIR OF PERFORATED ULCER  1973 &1975   Dr Rolene Course   STOMACH SURGERY  [phone]   removed 1/2 stomach; "had a total of 5 ORs from 1973-1980; all related to perforated ulcer"   TONSILLECTOMY     Family History  Problem Relation Age of Onset   Obesity Mother    Neuropathy Neg Hx    Colon cancer Neg Hx    Esophageal cancer Neg Hx    Pancreatic cancer Neg Hx    Stomach cancer Neg Hx    Social History   Socioeconomic History   Marital status: Divorced    Spouse name: Not on file   Number of children: 0   Years of education: Not on file   Highest education level: Not on file  Occupational History   Occupation: retired  Tobacco Use   Smoking status: Former    Packs/day: 1.00    Years: 40.00    Additional pack years: 0.00    Total pack years: 40.00     Types: Cigarettes    Quit date: 01/09/1994    Years since quitting: 28.4   Smokeless tobacco: Never  Vaping Use   Vaping Use: Never used  Substance and Sexual Activity   Alcohol use: No    Comment: 02/25/2017 "recovering alcoholic since1990"   Drug use: Never   Sexual activity: Not Currently  Other Topics Concern   Not on file  Social History Narrative   Lives alone at home   Caffeine: 1 large cup each morning    Social Determinants of Health   Financial Resource Strain: Low Risk  (08/25/2017)   Overall Financial Resource Strain (CARDIA)    Difficulty of Paying Living Expenses: Not hard at all  Food Insecurity: No Food Insecurity (08/25/2017)   Hunger Vital Sign    Worried About Running Out of Food in the Last Year: Never true    Ran Out of Food in the Last Year: Never true  Transportation Needs: No Transportation Needs (08/25/2017)   PRAPARE - Administrator, Civil Service (Medical): No    Lack of Transportation (Non-Medical): No  Physical Activity: Insufficiently Active (08/25/2017)   Exercise Vital Sign    Days of Exercise per Week: 1 day    Minutes of Exercise per Session: 30 min  Stress: No Stress Concern Present (08/25/2017)   [employer] of Occupational Health - Occupational Stress Questionnaire    Feeling of Stress : Only a little  Social Connections: Somewhat Isolated (08/25/2017)   Social Connection and Isolation Panel [NHANES]    Frequency of Communication with Friends and Family: More than three times a week    Frequency of Social Gatherings with Friends and Family: Twice a week    Attends Religious Services: More than 4 times per year    Active Member of [employer] or Organizations: No    Attends Banker Meetings: Never    Marital Status: Divorced    Tobacco Counseling Counseling given: Not Answered   Clinical Intake:                 Diabetic?Yes, Prediabetic         Activities of Daily Living    06/22/2022   11:12  AM  In your present state of health, do you have any difficulty performing the following activities:  Hearing? 0  Vision? 1  Difficulty concentrating or making decisions? 0  Walking or climbing stairs? 0  Dressing or bathing? 0  Doing errands, shopping? 0  Preparing Food and eating ? N  Using the Toilet? N  In the past six months, have you accidently leaked urine? Y  Comment sees a urologist  Do you have problems with loss of bowel control? N  Managing your Medications? N  Managing your Finances? N  Housekeeping or managing your Housekeeping? N    Patient Care Team: Sharon Seller, NP as PCP - General (Geriatric Medicine) Jethro Bolus, MD as Consulting Physician (Ophthalmology) Annamaria Helling, MD as Consulting Physician (Obstetrics and Gynecology) Janalyn Harder, MD (Inactive) as Consulting Physician (Dermatology)  Indicate any recent Medical Services you may have received from other than [facility] providers in the past year (date may be approximate).     Assessment:   This is a routine wellness examination for [place].  Hearing/Vision screen No results found.  Dietary issues and exercise activities discussed: Current Exercise Habits: Home exercise routine, Time (Minutes): 15, Frequency (Times/Week): 6, Weekly Exercise (Minutes/Week): 90, Intensity: Mild, Exercise limited by: None identified   Goals Addressed             This Visit's Progress    DIET - INCREASE WATER INTAKE       - will use use a water bottle to measure how much water intake -   will put a sticky tab on the counter to remind her to take water -  will put water on the coffee table       Depression Screen    06/22/2022   11:14 AM 02/02/2022    2:17 PM 05/15/2021    1:24 PM 01/20/2021    2:16 PM 07/19/2020    2:19 PM 04/30/2020    1:30 PM 01/18/2020    2:20 PM  PHQ 2/9 Scores  PHQ - 2 Score 2 0 2 0 2 0 0  PHQ- 9 Score 7  6  5       Fall Risk    06/22/2022   11:09 AM 04/13/2022    2:17 PM 02/02/2022     2:17 PM 01/29/2022   10:12 AM 09/16/2021    1:52 PM  Fall Risk   Falls in the past year? 0 0 0 0 0  Number falls in past yr: 0 0 0 0 0  Injury with Fall? 0 0 0 0 0  Risk for fall due to : No Fall Risks No Fall Risks No Fall Risks No Fall Risks No Fall Risks  Follow up Falls evaluation completed Falls evaluation completed Falls evaluation completed Falls evaluation completed Falls evaluation completed    FALL RISK PREVENTION PERTAINING TO THE HOME:  Any stairs in or around the home? No  If so, are there any without handrails?  N/A Home free of loose throw rugs in walkways, pet beds, electrical cords, etc? Yes  Adequate lighting in your home to reduce risk of falls? Yes   ASSISTIVE DEVICES UTILIZED TO PREVENT FALLS:  Life alert? No  Use of a cane, walker or w/c? No  Grab bars in the bathroom? No  Shower chair or bench in shower? No  Elevated toilet seat or a handicapped toilet? Yes   TIMED UP AND GO:  Was the test performed? Yes .  Length of time to ambulate 10 feet: 3 sec.   Gait steady and fast without use of assistive device  Cognitive Function:    06/22/2022   11:24 AM 08/25/2017    1:51 PM 07/06/2016    1:52 PM 07/01/2015    1:00 PM 04/09/2014  1:51 PM  MMSE - Mini Mental State Exam  Orientation to time 5 5 5 5 5   Orientation to Place 5 5 5 5 5   Registration 3 3 3 3 3   Attention/ Calculation 5 5 5 5 5   Recall 3 3 3 2 2   Language- name 2 objects 2 2 2 2 2   Language- repeat 1 1 1 1 1   Language- follow 3 step command 3 3 3 3 3   Language- read & follow direction 1 1 1 1 1   Write a sentence 1 1 1 1 1   Copy design 1 1 1 1 1   Total score 30 30 30 29 29         05/15/2021    1:29 PM 04/30/2020    1:33 PM  6CIT Screen  What Year? 0 points 0 points  What month? 0 points 0 points  What time? 0 points 0 points  Count back from 20 0 points 0 points  Months in reverse 0 points 0 points  Repeat phrase 0 points 0 points  Total Score 0 points 0 points     Immunizations Immunization History  Administered Date(s) Administered   PFIZER Comirnaty(Gray Top)Covid-19 Tri-Sucrose Vaccine 11/04/2020, 12/11/2021   PFIZER(Purple Top)SARS-COV-2 Vaccination 03/30/2019, 04/24/2019, 02/05/2020   Zoster Recombinat (Shingrix) 09/30/2020, 12/02/2020    TDAP status: Due, Education has been provided regarding the importance of this vaccine. Advised may receive this vaccine at local pharmacy or Health Dept. Aware to provide a copy of the vaccination record if obtained from local pharmacy or Health Dept. Verbalized acceptance and understanding.  Flu Vaccine status: Declined, Education has been provided regarding the importance of this vaccine but patient still declined. Advised may receive this vaccine at local pharmacy or Health Dept. Aware to provide a copy of the vaccination record if obtained from local pharmacy or Health Dept. Verbalized acceptance and understanding.  Pneumococcal vaccine status: Declined,  Education has been provided regarding the importance of this vaccine but patient still declined. Advised may receive this vaccine at local pharmacy or Health Dept. Aware to provide a copy of the vaccination record if obtained from local pharmacy or Health Dept. Verbalized acceptance and understanding.   Covid-19 vaccine status: Completed vaccines  Qualifies for Shingles Vaccine? Yes   Zostavax completed Yes   Shingrix Completed?: Yes  Screening Tests Health Maintenance  Topic Date Due   Pneumonia Vaccine 14+ Years old (1 of 2 - PCV) 02/03/2023 (Originally 10/17/1941)   COVID-19 Vaccine (6 - 2023-24 season) 02/17/2023 (Originally 02/05/2022)   Medicare Annual Wellness (AWV)  06/22/2023   Zoster Vaccines- Shingrix  Completed   HPV VACCINES  Aged Out   DTaP/Tdap/Td  Discontinued    Health Maintenance  There are no preventive care reminders to display for this patient.   Colorectal cancer screening: No longer required.   Mammogram status:  Completed 2 years ago. Repeat every year  Bone Density status: Completed Not done. Results reflect: Bone density results: NORMAL. Repeat every Declined years.  Lung Cancer Screening: (Low Dose CT Chest recommended if Age 65-80 years, 30 pack-year currently smoking OR have quit w/in 15years.) does not qualify.   Lung Cancer Screening Referral: No  Additional Screening:  Hepatitis C Screening: does not qualify; Completed Declined  Vision Screening: Recommended annual ophthalmology exams for early detection of glaucoma and other disorders of the eye. Is the patient up to date with their annual eye exam?  Yes  Who is the provider or what is the name of the  office in which the patient attends annual eye exams? Dr. Nile Riggs If pt is not established with a provider, would they like to be referred to a provider to establish care? No .   Dental Screening: Recommended annual dental exams for proper oral hygiene  Community Resource Referral / Chronic Care Management: CRR required this visit?  No   CCM required this visit?  No      Plan:     I have personally reviewed and noted the following in the patient's chart:   Medical and social history Use of alcohol, tobacco or illicit drugs  Current medications and supplements including opioid prescriptions. Patient is not currently taking opioid prescriptions. Functional ability and status Nutritional status Physical activity Advanced directives List of other physicians Hospitalizations, surgeries, and ER visits in previous 12 months Vitals Screenings to include cognitive, depression, and falls Referrals and appointments  In addition, I have reviewed and discussed with patient certain preventive protocols, quality metrics, and best practice recommendations. A written personalized care plan for preventive services as well as general preventive health recommendations were provided to patient.     Hideko Esselman Medina-Vargas, NP   06/22/2022    Nurse Notes: Refused PNA vaccine. Needs Tdap vaccine.

## 2022-06-23 ENCOUNTER — Encounter: Payer: Self-pay | Admitting: Nurse Practitioner

## 2022-06-23 ENCOUNTER — Ambulatory Visit (INDEPENDENT_AMBULATORY_CARE_PROVIDER_SITE_OTHER): Payer: 59 | Admitting: Nurse Practitioner

## 2022-06-23 VITALS — BP 116/80 | HR 74 | Temp 97.1°F | Resp 18 | Ht 64.0 in | Wt 103.2 lb

## 2022-06-23 DIAGNOSIS — N1832 Chronic kidney disease, stage 3b: Secondary | ICD-10-CM | POA: Diagnosis not present

## 2022-06-23 DIAGNOSIS — N3000 Acute cystitis without hematuria: Secondary | ICD-10-CM | POA: Diagnosis not present

## 2022-06-23 DIAGNOSIS — R238 Other skin changes: Secondary | ICD-10-CM | POA: Insufficient documentation

## 2022-06-23 DIAGNOSIS — D508 Other iron deficiency anemias: Secondary | ICD-10-CM | POA: Diagnosis not present

## 2022-06-23 DIAGNOSIS — I10 Essential (primary) hypertension: Secondary | ICD-10-CM | POA: Diagnosis not present

## 2022-06-23 DIAGNOSIS — N3281 Overactive bladder: Secondary | ICD-10-CM | POA: Diagnosis not present

## 2022-06-23 LAB — POCT URINALYSIS DIPSTICK
Appearance: NORMAL
Bilirubin, UA: NEGATIVE
Blood, UA: NEGATIVE
Glucose, UA: NEGATIVE
Ketones, UA: NEGATIVE
Nitrite, UA: NEGATIVE
Protein, UA: NEGATIVE
Spec Grav, UA: 1.01 (ref 1.010–1.025)
Urobilinogen, UA: 0.2 E.U./dL — AB
pH, UA: 6 (ref 5.0–8.0)

## 2022-06-23 NOTE — Assessment & Plan Note (Signed)
on [place]

## 2022-06-23 NOTE — Assessment & Plan Note (Signed)
c/o urogenital on and burning, itching, no redness/rash, vaginal discharge, anal irritation/hemorrhoids noted, failed estrace, nystatin. Denied dysuria, urinary frequency/urgency, or lower abd pain. Adult depends use and vaginal atrophy are contributory, suggested vaginal boric acid suppository, baking soda sitz bath, observe.

## 2022-06-23 NOTE — Assessment & Plan Note (Signed)
on Amlodipine, Valsartan, Nebivolol

## 2022-06-23 NOTE — Progress Notes (Signed)
Location:   [facility]   Place of Service:    Provider: Chipper Oman NP  Code Status: DNR Goals of Care:     06/22/2022   11:30 AM  Advanced Directives  Does Patient Have a Medical Advance Directive? Yes  Type of Advance Directive Living will     Chief Complaint  Patient presents with   Acute Visit    Patient is being seen for rash in groin area    HPI: Patient is a 87 y.o. female seen today for an acute visit for c/o urogenital on and burning, itching, no redness/rash, vaginal discharge, anal irritation/hemorrhoids noted, failed estrace, nystatin. Denied dysuria, urinary frequency/urgency, or lower abd pain.     CKD Bun/creat 19/1.53 02/03/22  IDA, off Fe, Hgb 11.7 02/03/22  HTN, on Amlodipine, Valsartan, Nebivolol  Overactive bladder, on Gemesta  Past Medical History:  Diagnosis Date   Age-related macular degeneration, dry, both eyes    Alcoholism (HCC)    Anemia    Anxiety    Cholelithiasis    Endometriosis    Goiter    Herpes simplex    Hyperlipidemia    Hypertension    Loss of weight    Nontoxic multinodular goiter    Other abnormal blood chemistry    PUD (peptic ulcer disease)    Renal cyst    Sciatica    Scoliosis (and kyphoscoliosis), idiopathic    Unspecified disorder of kidney and ureter    Urinary frequency     Past Surgical History:  Procedure Laterality Date   ABDOMINAL HYSTERECTOMY  1971   CATARACT EXTRACTION W/ INTRAOCULAR LENS  IMPLANT, BILATERAL Bilateral 2016   Dr. Jethro Bolus   IRRIGATION AND DEBRIDEMENT ABSCESS Left 02/23/2017   Procedure: IRRIGATION AND DEBRIDEMENT ABSCESS;  Surgeon: Kathryne Hitch, MD;  Location: [facility];  Service: Orthopedics;  Laterality: Left;   REPAIR OF PERFORATED ULCER  1973 &1975   Dr Rolene Course   STOMACH SURGERY  [phone]   removed 1/2 stomach; "had a total of 5 ORs from 1973-1980; all related to perforated ulcer"   TONSILLECTOMY      Allergies  Allergen Reactions   Cymbalta [Duloxetine Hcl]  Diarrhea    Allergies as of 06/23/2022       Reactions   Cymbalta [duloxetine Hcl] Diarrhea        Medication List        Accurate as of Jun 23, 2022 11:59 PM. If you have any questions, ask your nurse or doctor.          acyclovir ointment 5 % Commonly known as: ZOVIRAX Apply 1 Application topically as needed.   amLODipine-valsartan 10-320 MG tablet Commonly known as: EXFORGE TAKE 1 TABLET BY MOUTH EVERY DAY   ciprofloxacin 500 MG tablet Commonly known as: Cipro Take 1 tablet (500 mg total) by mouth daily with breakfast for 3 days. Started by: Court Gracia X Wen Merced, NP   CRANBERRY PO Take by mouth.   gabapentin 300 MG capsule Commonly known as: NEURONTIN TAKE 1 CAPSULE(300 MG) BY MOUTH AT BEDTIME   Gemtesa 75 MG Tabs Generic drug: Vibegron Take 1 tablet by mouth daily.   LORazepam 0.5 MG tablet Commonly known as: ATIVAN TAKE 1 TABLET(0.5 MG) BY MOUTH TWICE DAILY   nebivolol 10 MG tablet Commonly known as: BYSTOLIC TAKE 1/2 TABLET(5 MG) BY MOUTH DAILY   omeprazole 20 MG capsule Commonly known as: PRILOSEC TAKE 1 CAPSULE(20 MG) BY MOUTH TWICE DAILY BEFORE A MEAL   PRESERVISION AREDS  2 PO Take by mouth daily.   triamcinolone cream 0.1 % Commonly known as: KENALOG Apply 1 Application topically 2 (two) times daily.        Review of Systems:  Review of Systems  Constitutional:  Negative for appetite change and fever.  Eyes:  Negative for visual disturbance.  Respiratory:  Negative for cough and shortness of breath.   Cardiovascular:  Negative for leg swelling.  Gastrointestinal:  Negative for abdominal pain, anal bleeding, nausea, rectal pain and vomiting.  Genitourinary:  Negative for dysuria, genital sores, urgency, vaginal bleeding, vaginal discharge and vaginal pain.       Urinary leakage.   Skin:  Negative for color change.  Neurological:  Negative for weakness and headaches.  Psychiatric/Behavioral:  Negative for confusion. The patient is not  nervous/anxious.     Health Maintenance  Topic Date Due   Pneumonia Vaccine 75+ Years old (1 of 2 - PCV) 02/03/2023 (Originally 10/17/1941)   COVID-19 Vaccine (6 - 2023-24 season) 02/17/2023 (Originally 02/05/2022)   Medicare Annual Wellness (AWV)  06/22/2023   Zoster Vaccines- Shingrix  Completed   HPV VACCINES  Aged Out   DTaP/Tdap/Td  Discontinued    Physical Exam: Vitals:   06/23/22 1148  BP: 116/80  Pulse: 74  Resp: 18  Temp: (!) 97.1 F (36.2 C)  SpO2: 99%  Weight: 103 lb 3.2 oz (46.8 kg)  Height: 5\' 4"  (1.626 m)   Body mass index is 17.71 kg/m. Physical Exam Constitutional:      Appearance: Normal appearance.  HENT:     Head: Normocephalic and atraumatic.     Nose: Nose normal.     Mouth/Throat:     Mouth: Mucous membranes are moist.  Eyes:     Extraocular Movements: Extraocular movements intact.     Conjunctiva/sclera: Conjunctivae normal.     Pupils: Pupils are equal, round, and reactive to light.  Cardiovascular:     Rate and Rhythm: Normal rate and regular rhythm.  Pulmonary:     Effort: Pulmonary effort is normal.     Breath sounds: No rales.  Abdominal:     General: Bowel sounds are normal. There is no distension.     Palpations: Abdomen is soft.     Tenderness: There is no abdominal tenderness. There is no right CVA tenderness, left CVA tenderness, guarding or rebound.  Genitourinary:    General: Normal vulva.     Vagina: No vaginal discharge.     Rectum: Normal.  Musculoskeletal:     Cervical back: Normal range of motion and neck supple.     Right lower leg: No edema.     Left lower leg: No edema.  Skin:    General: Skin is warm and dry.     Findings: No erythema or rash.  Neurological:     General: No focal deficit present.     Mental Status: She is alert and oriented to person, place, and time. Mental status is at baseline.     Gait: Gait normal.  Psychiatric:        Mood and Affect: Mood normal.        Thought Content: Thought content  normal.        Judgment: Judgment normal.     Labs reviewed: Basic Metabolic Panel: Recent Labs    07/24/21 0807 02/03/22 1118  NA 136 138  K 5.0 4.1  CL 105 109  CO2 22 19*  GLUCOSE 103* 113*  BUN 16 19  CREATININE 1.44* 1.53*  CALCIUM 9.4 9.1   Liver Function Tests: Recent Labs    07/24/21 0807 02/03/22 1118  AST 25 23  ALT 17 19  BILITOT 0.4 0.4  PROT 6.8 6.6   No results for input(s): "LIPASE", "AMYLASE" in the last 8760 hours. No results for input(s): "AMMONIA" in the last 8760 hours. CBC: Recent Labs    07/24/21 0807 02/03/22 1118  WBC 7.2 7.4  NEUTROABS 3,730 4,632  HGB 12.2 11.7  HCT 37.6 35.5  MCV 87.6 87.4  PLT 336 289   Lipid Panel: No results for input(s): "CHOL", "HDL", "LDLCALC", "TRIG", "CHOLHDL", "LDLDIRECT" in the last 8760 hours. Lab Results  Component Value Date   HGBA1C 5.9 (H) 02/03/2022    Procedures since last visit: No results found.  Assessment/Plan Stage 3b chronic kidney disease (HCC) Bun/creat 19/1.53 02/03/22  Iron deficiency anemia secondary to inadequate dietary iron intake  off Fe, Hgb 11.7 02/03/22  Essential hypertension, benign on Amlodipine, Valsartan, Nebivolol  Overactive bladder on Gemesta  Skin irritation  c/o urogenital on and burning, itching, no redness/rash, vaginal discharge, anal irritation/hemorrhoids noted, failed estrace, nystatin. Denied dysuria, urinary frequency/urgency, or lower abd pain. Adult depends use and vaginal atrophy are contributory, suggested vaginal boric acid suppository, baking soda sitz bath, observe.   UTI (urinary tract infection) Urine culture 50-100,000c/ml, susceptible to Cipro 500mg  qd x 3 days, CrCl 19    Labs/tests ordered: none  Next appt:  08/03/2022

## 2022-06-23 NOTE — Assessment & Plan Note (Signed)
Bun/creat 19/1.53 02/03/22

## 2022-06-23 NOTE — Assessment & Plan Note (Signed)
off Fe, Hgb 11.7 02/03/22

## 2022-06-25 DIAGNOSIS — N390 Urinary tract infection, site not specified: Secondary | ICD-10-CM | POA: Insufficient documentation

## 2022-06-25 LAB — URINE CULTURE
MICRO NUMBER:: 14925377
SPECIMEN QUALITY:: ADEQUATE

## 2022-06-25 MED ORDER — CIPROFLOXACIN HCL 500 MG PO TABS: 500.0000 mg | ORAL_TABLET | Freq: Every day | ORAL | 0 refills | Status: AC

## 2022-06-25 NOTE — Addendum Note (Signed)
Addended by: Maggie Schwalbe X on: 06/25/2022 04:19 PM   Modules accepted: Orders

## 2022-06-25 NOTE — Assessment & Plan Note (Signed)
Urine culture 50-100,000c/ml, susceptible to Cipro 500mg  qd x 3 days, CrCl 19

## 2022-07-28 ENCOUNTER — Ambulatory Visit (INDEPENDENT_AMBULATORY_CARE_PROVIDER_SITE_OTHER): Payer: 59 | Admitting: Family

## 2022-07-28 ENCOUNTER — Encounter: Payer: Self-pay | Admitting: Family

## 2022-07-28 VITALS — BP 138/78 | HR 68 | Temp 96.8°F | Ht 64.0 in | Wt 104.6 lb

## 2022-07-28 DIAGNOSIS — R238 Other skin changes: Secondary | ICD-10-CM

## 2022-07-28 DIAGNOSIS — R300 Dysuria: Secondary | ICD-10-CM | POA: Diagnosis not present

## 2022-07-28 LAB — POCT URINALYSIS DIPSTICK
Bilirubin, UA: NEGATIVE
Blood, UA: NEGATIVE
Glucose, UA: NEGATIVE
Ketones, UA: POSITIVE
Nitrite, UA: NEGATIVE
Protein, UA: NEGATIVE
Spec Grav, UA: 1.01 (ref 1.010–1.025)
Urobilinogen, UA: NEGATIVE E.U./dL — AB
pH, UA: 5 (ref 5.0–8.0)

## 2022-07-28 MED ORDER — SACCHAROMYCES BOULARDII 250 MG PO CAPS: 250.0000 mg | ORAL_CAPSULE | Freq: Two times a day (BID) | ORAL | 0 refills | Status: DC

## 2022-07-28 MED ORDER — CIPROFLOXACIN HCL 500 MG PO TABS: 500.0000 mg | ORAL_TABLET | Freq: Two times a day (BID) | ORAL | 0 refills | Status: DC

## 2022-07-28 NOTE — Progress Notes (Signed)
Provider: Makoa Satz FNP-C  Sharon Seller, NP  Patient Care Team: Sharon Seller, NP as PCP - General (Geriatric Medicine) Jethro Bolus, MD as Consulting Physician (Ophthalmology) Annamaria Helling, MD as Consulting Physician (Obstetrics and Gynecology) Janalyn Harder, MD (Inactive) as Consulting Physician (Dermatology)  Extended Emergency Contact Information Primary Emergency Contact: Martha Clan [place] Phone: [phone] Relation: Friend Secondary Emergency Contact: Gerhard Perches Mobile Phone: [phone] Relation: Significant other  Code Status:  Full Code  Goals of care: Advanced Directive information    06/22/2022   11:30 AM  Advanced Directives  Does Patient Have a Medical Advance Directive? Yes  Type of Advance Directive Living will     Chief Complaint  Patient presents with   Acute Visit    Patient presents today for urinary frequency for 3 days now.     HPI:  Pt is a 87 y.o. female seen today for an acute visit for evaluation of urinary frequency and dysuria.has incontinence but feels something has changed.  She denies any fever,chills,nausea,vomiting,abdominal pain,flank pain,urgency,difficult urination or hematuria. Also states feels like skin is irritated on her private area and feels something swollen but not sure what it is.would like provider to examine.   Past Medical History:  Diagnosis Date   Age-related macular degeneration, dry, both eyes    Alcoholism (HCC)    Anemia    Anxiety    Cholelithiasis    Endometriosis    Goiter    Herpes simplex    Hyperlipidemia    Hypertension    Loss of weight    Nontoxic multinodular goiter    Other abnormal blood chemistry    PUD (peptic ulcer disease)    Renal cyst    Sciatica    Scoliosis (and kyphoscoliosis), idiopathic    Unspecified disorder of kidney and ureter    Urinary frequency    Past Surgical History:  Procedure Laterality Date   ABDOMINAL  HYSTERECTOMY  1971   CATARACT EXTRACTION W/ INTRAOCULAR LENS  IMPLANT, BILATERAL Bilateral 2016   Dr. Jethro Bolus   IRRIGATION AND DEBRIDEMENT ABSCESS Left 02/23/2017   Procedure: IRRIGATION AND DEBRIDEMENT ABSCESS;  Surgeon: Kathryne Hitch, MD;  Location: [facility];  Service: Orthopedics;  Laterality: Left;   REPAIR OF PERFORATED ULCER  1973 &1975   Dr Rolene Course   STOMACH SURGERY  [phone]   removed 1/2 stomach; "had a total of 5 ORs from 1973-1980; all related to perforated ulcer"   TONSILLECTOMY      Allergies  Allergen Reactions   Cymbalta [Duloxetine Hcl] Diarrhea    Outpatient Encounter Medications as of 07/28/2022  Medication Sig   acyclovir ointment (ZOVIRAX) 5 % Apply 1 Application topically as needed.   amLODipine-valsartan (EXFORGE) 10-320 MG tablet TAKE 1 TABLET BY MOUTH EVERY DAY   CRANBERRY PO Take by mouth.   gabapentin (NEURONTIN) 300 MG capsule TAKE 1 CAPSULE(300 MG) BY MOUTH AT BEDTIME   GEMTESA 75 MG TABS Take 1 tablet by mouth daily.   LORazepam (ATIVAN) 0.5 MG tablet TAKE 1 TABLET(0.5 MG) BY MOUTH TWICE DAILY   Multiple Vitamins-Minerals (PRESERVISION AREDS 2 PO) Take by mouth daily.   nebivolol (BYSTOLIC) 10 MG tablet TAKE 1/2 TABLET(5 MG) BY MOUTH DAILY   omeprazole (PRILOSEC) 20 MG capsule TAKE 1 CAPSULE(20 MG) BY MOUTH TWICE DAILY BEFORE A MEAL   triamcinolone cream (KENALOG) 0.1 % Apply 1 Application topically 2 (two) times daily.   No facility-administered encounter medications on file as of 07/28/2022.  Review of Systems  Constitutional:  Negative for appetite change, chills, fatigue, fever and unexpected weight change.  Respiratory:  Negative for cough, chest tightness, shortness of breath and wheezing.   Cardiovascular:  Negative for chest pain, palpitations and leg swelling.  Gastrointestinal:  Negative for abdominal distention, abdominal pain, nausea and vomiting.  Genitourinary:  Positive for dysuria and frequency. Negative for difficulty  urinating, flank pain and urgency.  Musculoskeletal:  Negative for arthralgias, back pain, gait problem and joint swelling.  Skin:  Negative for color change, pallor and rash.  Psychiatric/Behavioral:  Negative for agitation, behavioral problems, confusion, hallucinations and sleep disturbance. The patient is not nervous/anxious.     Immunization History  Administered Date(s) Administered   PFIZER Comirnaty(Gray Top)Covid-19 Tri-Sucrose Vaccine 11/04/2020, 12/11/2021   PFIZER(Purple Top)SARS-COV-2 Vaccination 03/30/2019, 04/24/2019, 02/05/2020   Zoster Recombinat (Shingrix) 09/30/2020, 12/02/2020   There are no preventive care reminders to display for this patient.    09/16/2021    1:52 PM 01/29/2022   10:12 AM 02/02/2022    2:17 PM 04/13/2022    2:17 PM 06/22/2022   11:09 AM  Fall Risk  Falls in the past year? 0 0 0 0 0  Was there an injury with Fall? 0 0 0 0 0  Fall Risk Category Calculator 0 0 0 0 0  Fall Risk Category (Retired) Low Low Low    (RETIRED) Patient Fall Risk Level Low fall risk Low fall risk Low fall risk    Patient at Risk for Falls Due to No Fall Risks No Fall Risks No Fall Risks No Fall Risks No Fall Risks  Fall risk Follow up Falls evaluation completed Falls evaluation completed Falls evaluation completed Falls evaluation completed Falls evaluation completed   Functional Status Survey:    Vitals:   07/28/22 0926  BP: 138/78  Pulse: 68  Temp: (!) 96.8 F (36 C)  SpO2: 99%  Weight: 104 lb 9.6 oz (47.4 kg)  Height: 5\' 4"  (1.626 m)   Body mass index is 17.95 kg/m. Physical Exam Vitals reviewed. Exam conducted with a chaperone present (Jasmine dillard,CMA).  Constitutional:      General: She is not in acute distress.    Appearance: Normal appearance. She is underweight. She is not ill-appearing or diaphoretic.  HENT:     Head: Normocephalic.  Eyes:     General: No scleral icterus.       Right eye: No discharge.        Left eye: No discharge.      Conjunctiva/sclera: Conjunctivae normal.     Pupils: Pupils are equal, round, and reactive to light.  Cardiovascular:     Rate and Rhythm: Normal rate and regular rhythm.     Pulses: Normal pulses.     Heart sounds: Normal heart sounds. No murmur heard.    No friction rub. No gallop.  Pulmonary:     Effort: Pulmonary effort is normal. No respiratory distress.     Breath sounds: Normal breath sounds. No wheezing, rhonchi or rales.  Chest:     Chest wall: No tenderness.  Abdominal:     General: Bowel sounds are normal. There is no distension.     Palpations: Abdomen is soft. There is no mass.     Tenderness: There is no abdominal tenderness. There is no right CVA tenderness, left CVA tenderness, guarding or rebound.     Hernia: There is no hernia in the left inguinal area or right inguinal area.  Genitourinary:    Exam  position: Lithotomy position.     Labia:        Right: No rash, tenderness, lesion or injury.        Left: No rash, tenderness, lesion or injury.      Urethra: No prolapse, urethral pain, urethral swelling or urethral lesion.  Lymphadenopathy:     Lower Body: No left inguinal adenopathy.  Skin:    General: Skin is warm and dry.     Coloration: Skin is not pale.     Findings: No bruising, erythema, lesion or rash.  Neurological:     Mental Status: She is alert and oriented to person, place, and time.     Motor: No weakness.     Gait: Gait normal.  Psychiatric:        Mood and Affect: Mood normal.        Speech: Speech normal.        Behavior: Behavior normal.     Labs reviewed: Recent Labs    02/03/22 1118  NA 138  K 4.1  CL 109  CO2 19*  GLUCOSE 113*  BUN 19  CREATININE 1.53*  CALCIUM 9.1   Recent Labs    02/03/22 1118  AST 23  ALT 19  BILITOT 0.4  PROT 6.6   Recent Labs    02/03/22 1118  WBC 7.4  NEUTROABS 4,632  HGB 11.7  HCT 35.5  MCV 87.4  PLT 289   Lab Results  Component Value Date   TSH 0.79 01/16/2021   Lab Results   Component Value Date   HGBA1C 5.9 (H) 02/03/2022   Lab Results  Component Value Date   CHOL 220 (H) 06/29/2018   HDL 134 06/29/2018   LDLCALC 69 06/29/2018   TRIG 90 06/29/2018   CHOLHDL 1.6 06/29/2018    Significant Diagnostic Results in last 30 days:  No results found.  Assessment/Plan 1. Dysuria Afebrile  Has had frequent voiding.voided twice within short period during visit.would like to start on ABX made aware might need to switch antibiotics if culture and sensitivity indicates different abxs.  - POC Urinalysis Dipstick indicated yellow clear urine positive for ketones and large 3+ leukocytes but negative for blood and nitrites. - Urine Culture - Encouraged to increase water intake - ciprofloxacin (CIPRO) 500 MG tablet; Take 1 tablet (500 mg total) by mouth 2 (two) times daily for 10 days.  Dispense: 20 tablet; Refill: 0 - saccharomyces boulardii (FLORASTOR) 250 MG capsule; Take 1 capsule (250 mg total) by mouth 2 (two) times daily for 10 days.  Dispense: 20 capsule; Refill: 0   2. Skin irritation Chaperone present throughout the exam Meda Klinefelter, CMA -Bilateral labial slight skin irritation possible from incontinent depend.  Encourage PeriCare and prompt changing of incontinence depends. -Advised to apply Desitin to affected areas and.  Area for skin protection -Notify provider if symptoms worsen or not resolved  Family/ staff Communication: Reviewed plan of care with patient verbalized understanding  Labs/tests ordered:  - POC Urinalysis Dipstick  - Urine Culture  Next Appointment: Return if symptoms worsen or fail to improve.   Caesar Bookman, NP

## 2022-07-29 ENCOUNTER — Telehealth: Payer: Self-pay

## 2022-07-29 DIAGNOSIS — R300 Dysuria: Secondary | ICD-10-CM | POA: Diagnosis not present

## 2022-07-29 NOTE — Telephone Encounter (Signed)
Patient called stating she was prescribed an antibiotic yesterday (cipro), in which she started,  and it is not agreeing with her. Patient states " I have a bitter taste in my mouth now and I'm not 100 % that it's the antibiotic, although its the only new thing I've started in the last 24 hours."   Patient states she is on the antibiotic for a possible UTI, which has not been confirmed yet and would like to know what to do.  Please advise

## 2022-07-29 NOTE — Telephone Encounter (Signed)
Not sure if bitter taste is from the antibiotics or not.Recommend taking with apple sauce or yogurt while awaiting final urine culture.if symptoms worsen still then discontinue.

## 2022-07-30 DIAGNOSIS — R8271 Bacteriuria: Secondary | ICD-10-CM | POA: Diagnosis not present

## 2022-07-30 DIAGNOSIS — R300 Dysuria: Secondary | ICD-10-CM | POA: Diagnosis not present

## 2022-07-30 LAB — URINE CULTURE
MICRO NUMBER:: 15074201
SPECIMEN QUALITY:: ADEQUATE

## 2022-07-30 NOTE — Telephone Encounter (Signed)
noted 

## 2022-07-30 NOTE — Telephone Encounter (Signed)
Patient states that she has stopped taking antibiotic already once having the bad taste in mouth. Patient states she has appointment with [facility] today and will discuss it with them as well.

## 2022-08-03 ENCOUNTER — Ambulatory Visit (INDEPENDENT_AMBULATORY_CARE_PROVIDER_SITE_OTHER): Payer: 59 | Admitting: Nurse Practitioner

## 2022-08-03 ENCOUNTER — Encounter: Payer: Self-pay | Admitting: Nurse Practitioner

## 2022-08-03 VITALS — BP 118/76 | HR 65 | Temp 97.2°F | Ht 64.0 in | Wt 104.0 lb

## 2022-08-03 DIAGNOSIS — N3281 Overactive bladder: Secondary | ICD-10-CM | POA: Diagnosis not present

## 2022-08-03 DIAGNOSIS — R739 Hyperglycemia, unspecified: Secondary | ICD-10-CM | POA: Diagnosis not present

## 2022-08-03 DIAGNOSIS — I10 Essential (primary) hypertension: Secondary | ICD-10-CM

## 2022-08-03 DIAGNOSIS — I700 Atherosclerosis of aorta: Secondary | ICD-10-CM | POA: Diagnosis not present

## 2022-08-03 DIAGNOSIS — F320 Major depressive disorder, single episode, mild: Secondary | ICD-10-CM

## 2022-08-03 DIAGNOSIS — F419 Anxiety disorder, unspecified: Secondary | ICD-10-CM

## 2022-08-03 DIAGNOSIS — N1832 Chronic kidney disease, stage 3b: Secondary | ICD-10-CM | POA: Diagnosis not present

## 2022-08-03 NOTE — Progress Notes (Signed)
Careteam: Patient Care Team: Sharon Seller, NP as PCP - General (Geriatric Medicine) Jethro Bolus, MD as Consulting Physician (Ophthalmology) Annamaria Helling, MD as Consulting Physician (Obstetrics and Gynecology) Janalyn Harder, MD (Inactive) as Consulting Physician (Dermatology)  PLACE OF SERVICE:  [facility]  Advanced Directive information Does Patient Have a Medical Advance Directive?: Yes, Type of Advance Directive: Healthcare Power of [place];Living will, Does patient want to make changes to medical advance directive?: No - Patient declined  Allergies  Allergen Reactions   Cymbalta [Duloxetine Hcl] Diarrhea    Chief Complaint  Patient presents with   Medical Management of Chronic Issues    6 month follow-up.     HPI: Patient is a 87 y.o. female for routine follow up   Anxiety controlled on ativan, does not want to change any medication  OAB- controlled on gemtesa.   Working with a spiritual healer due to her incontinence and itchy scalp.  No cure from the specialist but working to process these issues that can not be cured.   Blood pressure well controlled.   Review of Systems:  Review of Systems  Constitutional:  Negative for chills, fever and weight loss.  HENT:  Negative for tinnitus.   Respiratory:  Negative for cough, sputum production and shortness of breath.   Cardiovascular:  Negative for chest pain, palpitations and leg swelling.  Gastrointestinal:  Negative for abdominal pain, constipation, diarrhea and heartburn.  Genitourinary:  Negative for dysuria, frequency and urgency.       Incontinence of urine   Musculoskeletal:  Negative for back pain, falls, joint pain and myalgias.  Skin: Negative.   Neurological:  Positive for tingling and sensory change. Negative for dizziness and headaches.  Psychiatric/Behavioral:  Positive for depression. Negative for memory loss. The patient is nervous/anxious. The patient does not have insomnia.     Past  Medical History:  Diagnosis Date   Age-related macular degeneration, dry, both eyes    Alcoholism (HCC)    Anemia    Anxiety    Cholelithiasis    Endometriosis    Goiter    Herpes simplex    Hyperlipidemia    Hypertension    Loss of weight    Nontoxic multinodular goiter    Other abnormal blood chemistry    PUD (peptic ulcer disease)    Renal cyst    Sciatica    Scoliosis (and kyphoscoliosis), idiopathic    Unspecified disorder of kidney and ureter    Urinary frequency    Past Surgical History:  Procedure Laterality Date   ABDOMINAL HYSTERECTOMY  1971   CATARACT EXTRACTION W/ INTRAOCULAR LENS  IMPLANT, BILATERAL Bilateral 2016   Dr. Jethro Bolus   IRRIGATION AND DEBRIDEMENT ABSCESS Left 02/23/2017   Procedure: IRRIGATION AND DEBRIDEMENT ABSCESS;  Surgeon: Kathryne Hitch, MD;  Location: [facility] OR;  Service: Orthopedics;  Laterality: Left;   REPAIR OF PERFORATED ULCER  1973 &1975   Dr Rolene Course   STOMACH SURGERY  [phone]   removed 1/2 stomach; "had a total of 5 ORs from 1973-1980; all related to perforated ulcer"   TONSILLECTOMY     Social History:   reports that she quit smoking about 28 years ago. Her smoking use included cigarettes. She has a 40.00 pack-year smoking history. She has never used smokeless tobacco. She reports that she does not drink alcohol and does not use drugs.  Family History  Problem Relation Age of Onset   Obesity Mother    Neuropathy Neg Hx  Colon cancer Neg Hx    Esophageal cancer Neg Hx    Pancreatic cancer Neg Hx    Stomach cancer Neg Hx     Medications: Patient's Medications  New Prescriptions   No medications on file  Previous Medications   ACYCLOVIR OINTMENT (ZOVIRAX) 5 %    Apply 1 Application topically as needed.   AMLODIPINE-VALSARTAN (EXFORGE) 10-320 MG TABLET    TAKE 1 TABLET BY MOUTH EVERY DAY   CRANBERRY PO    Take by mouth.   GABAPENTIN (NEURONTIN) 300 MG CAPSULE    TAKE 1 CAPSULE(300 MG) BY MOUTH AT BEDTIME   GEMTESA  75 MG TABS    Take 1 tablet by mouth daily.   LORAZEPAM (ATIVAN) 0.5 MG TABLET    TAKE 1 TABLET(0.5 MG) BY MOUTH TWICE DAILY   MULTIPLE VITAMINS-MINERALS (PRESERVISION AREDS 2 PO)    Take by mouth daily.   NEBIVOLOL (BYSTOLIC) 10 MG TABLET    TAKE 1/2 TABLET(5 MG) BY MOUTH DAILY   OMEPRAZOLE (PRILOSEC) 20 MG CAPSULE    TAKE 1 CAPSULE(20 MG) BY MOUTH TWICE DAILY BEFORE A MEAL   TRIAMCINOLONE CREAM (KENALOG) 0.1 %    Apply 1 Application topically 2 (two) times daily.  Modified Medications   No medications on file  Discontinued Medications   SACCHAROMYCES BOULARDII (FLORASTOR) 250 MG CAPSULE    Take 1 capsule (250 mg total) by mouth 2 (two) times daily for 10 days.    Physical Exam:  Vitals:   08/03/22 1012  BP: 118/76  Pulse: 65  Temp: (!) 97.2 F (36.2 C)  TempSrc: Temporal  SpO2: 99%  Weight: 104 lb (47.2 kg)  Height: 5\' 4"  (1.626 m)   Body mass index is 17.85 kg/m. Wt Readings from Last 3 Encounters:  08/03/22 104 lb (47.2 kg)  07/28/22 104 lb 9.6 oz (47.4 kg)  06/23/22 103 lb 3.2 oz (46.8 kg)    Physical Exam Constitutional:      General: She is not in acute distress.    Appearance: She is well-developed. She is not diaphoretic.  HENT:     Head: Normocephalic and atraumatic.     Mouth/Throat:     Pharynx: No oropharyngeal exudate.  Eyes:     Conjunctiva/sclera: Conjunctivae normal.     Pupils: Pupils are equal, round, and reactive to light.  Cardiovascular:     Rate and Rhythm: Normal rate and regular rhythm.     Heart sounds: Normal heart sounds.  Pulmonary:     Effort: Pulmonary effort is normal.     Breath sounds: Normal breath sounds.  Abdominal:     General: Bowel sounds are normal.     Palpations: Abdomen is soft.  Musculoskeletal:     Cervical back: Normal range of motion and neck supple.     Right lower leg: No edema.     Left lower leg: No edema.  Skin:    General: Skin is warm and dry.  Neurological:     Mental Status: She is alert and oriented  to person, place, and time.  Psychiatric:        Mood and Affect: Mood normal.     Labs reviewed: Basic Metabolic Panel: Recent Labs    02/03/22 1118  NA 138  K 4.1  CL 109  CO2 19*  GLUCOSE 113*  BUN 19  CREATININE 1.53*  CALCIUM 9.1   Liver Function Tests: Recent Labs    02/03/22 1118  AST 23  ALT 19  BILITOT 0.4  PROT 6.6   No results for input(s): "LIPASE", "AMYLASE" in the last 8760 hours. No results for input(s): "AMMONIA" in the last 8760 hours. CBC: Recent Labs    02/03/22 1118  WBC 7.4  NEUTROABS 4,632  HGB 11.7  HCT 35.5  MCV 87.4  PLT 289   Lipid Panel: No results for input(s): "CHOL", "HDL", "LDLCALC", "TRIG", "CHOLHDL", "LDLDIRECT" in the last 8760 hours. TSH: No results for input(s): "TSH" in the last 8760 hours. A1C: Lab Results  Component Value Date   HGBA1C 5.9 (H) 02/03/2022     Assessment/Plan 1. Stage 3b chronic kidney disease (HCC) Encourage proper hydration Follow metabolic panel Avoid nephrotoxic meds (NSAIDS) - CBC with Differential/Platelet - Complete Metabolic Panel with eGFR  2. Essential hypertension, benign Blood pressure well controlled, goal bp <140/90 Continue current medications and dietary modifications follow metabolic panel - CBC with Differential/Platelet - Complete Metabolic Panel with eGFR  3. Overactive bladder -stable on gemtesa   4. Anxiety -continues on ativan, discussed risk associated with medication and recommendation to start SSRI or other medication that does not have the adverse effects but she does not wish to start any additional medication at this time.   5. Hyperglycemia - Hemoglobin A1c  6. Aortic atherosclerosis (HCC) Unable to tolerate ASA - Lipid panel  7. Mild major depression (HCC) -reports she is depressed some days due to her incontinence and chronic skin irritation. Continues to work with a healer to help her cope.   Return in about 6 months (around 02/02/2023) for routine  follow up.  [name]. [name] [facility] [phone]

## 2022-08-04 LAB — COMPLETE METABOLIC PANEL WITH GFR
AG Ratio: 1.8 (calc) (ref 1.0–2.5)
ALT: 17 U/L (ref 6–29)
AST: 31 U/L (ref 10–35)
Albumin: 4.3 g/dL (ref 3.6–5.1)
Alkaline phosphatase (APISO): 81 U/L (ref 37–153)
BUN/Creatinine Ratio: 16 (calc) (ref 6–22)
BUN: 24 mg/dL (ref 7–25)
CO2: 22 mmol/L (ref 20–32)
Calcium: 9.5 mg/dL (ref 8.6–10.4)
Chloride: 100 mmol/L (ref 98–110)
Creat: 1.52 mg/dL — ABNORMAL HIGH (ref 0.60–0.95)
Globulin: 2.4 g/dL (calc) (ref 1.9–3.7)
Glucose, Bld: 163 mg/dL — ABNORMAL HIGH (ref 65–139)
Potassium: 5 mmol/L (ref 3.5–5.3)
Sodium: 134 mmol/L — ABNORMAL LOW (ref 135–146)
Total Bilirubin: 0.3 mg/dL (ref 0.2–1.2)
Total Protein: 6.7 g/dL (ref 6.1–8.1)
eGFR: 34 mL/min/{1.73_m2} — ABNORMAL LOW (ref >=60)

## 2022-08-04 LAB — CBC WITH DIFFERENTIAL/PLATELET
Absolute Monocytes: 630 cells/uL (ref 200–950)
Basophils Absolute: 59 cells/uL (ref 0–200)
Basophils Relative: 0.7 %
Eosinophils Absolute: 151 cells/uL (ref 15–500)
Eosinophils Relative: 1.8 %
HCT: 35.3 % (ref 35.0–45.0)
Hemoglobin: 11.4 g/dL — ABNORMAL LOW (ref 11.7–15.5)
Lymphs Abs: 2234 cells/uL (ref 850–3900)
MCH: 28.8 pg (ref 27.0–33.0)
MCHC: 32.3 g/dL (ref 32.0–36.0)
MCV: 89.1 fL (ref 80.0–100.0)
MPV: 10.3 fL (ref 7.5–12.5)
Monocytes Relative: 7.5 %
Neutro Abs: 5326 cells/uL (ref 1500–7800)
Neutrophils Relative %: 63.4 %
Platelets: 294 10*3/uL (ref 140–400)
RBC: 3.96 10*6/uL (ref 3.80–5.10)
RDW: 12.2 % (ref 11.0–15.0)
Total Lymphocyte: 26.6 %
WBC: 8.4 10*3/uL (ref 3.8–10.8)

## 2022-08-04 LAB — HEMOGLOBIN A1C
Hgb A1c MFr Bld: 6 % of total Hgb — ABNORMAL HIGH (ref <5.7)
Mean Plasma Glucose: 126 mg/dL
eAG (mmol/L): 7 mmol/L

## 2022-08-04 LAB — LIPID PANEL
Cholesterol: 208 mg/dL — ABNORMAL HIGH (ref <200)
HDL: 140 mg/dL (ref >=50)
LDL Cholesterol (Calc): 49 mg/dL (calc)
Non-HDL Cholesterol (Calc): 68 mg/dL (calc) (ref <130)
Total CHOL/HDL Ratio: 1.5 (calc) (ref <5.0)
Triglycerides: 108 mg/dL (ref <150)

## 2022-08-06 ENCOUNTER — Other Ambulatory Visit: Payer: Self-pay | Admitting: Nurse Practitioner

## 2022-08-06 DIAGNOSIS — F419 Anxiety disorder, unspecified: Secondary | ICD-10-CM

## 2022-08-06 NOTE — Telephone Encounter (Signed)
Patient is requesting a refill of the following medications: Requested Prescriptions   Pending Prescriptions Disp Refills   LORazepam (ATIVAN) 0.5 MG tablet [Pharmacy Med Name: LORAZEPAM 0.5MG  TABLETS] 60 tablet     Sig: TAKE 1 TABLET(0.5 MG) BY MOUTH TWICE DAILY    Date of last refill: 02/03/2022   Refill amount: 60/5 refills   Treatment agreement date: June 2023, patient will update at follow-up due in December 2024

## 2022-08-31 ENCOUNTER — Other Ambulatory Visit: Payer: Self-pay

## 2022-08-31 MED ORDER — GEMTESA 75 MG PO TABS: 1.0000 | ORAL_TABLET | Freq: Every day | ORAL | 3 refills | Status: DC

## 2022-08-31 NOTE — Telephone Encounter (Signed)
Patient called to say her pharmacy sent [place] a request 5 days ago for [facility] and are still awaiting a reply. I assured patient that we have not received a request from [facility] for the [facility] however I will approve for her at this time.  I suggested patient check to see which prescriber the Leslye Peer was sent to, to avoid delays or gaps in taking medication in the future.

## 2022-09-05 ENCOUNTER — Other Ambulatory Visit: Payer: Self-pay | Admitting: Nurse Practitioner

## 2022-09-05 DIAGNOSIS — R208 Other disturbances of skin sensation: Secondary | ICD-10-CM

## 2022-09-14 ENCOUNTER — Other Ambulatory Visit: Payer: Self-pay | Admitting: Nurse Practitioner

## 2022-09-14 DIAGNOSIS — K219 Gastro-esophageal reflux disease without esophagitis: Secondary | ICD-10-CM

## 2022-10-15 DIAGNOSIS — Z961 Presence of intraocular lens: Secondary | ICD-10-CM | POA: Diagnosis not present

## 2022-10-15 DIAGNOSIS — H3530 Unspecified macular degeneration: Secondary | ICD-10-CM | POA: Diagnosis not present

## 2022-10-22 DIAGNOSIS — R202 Paresthesia of skin: Secondary | ICD-10-CM | POA: Diagnosis not present

## 2022-10-22 DIAGNOSIS — L218 Other seborrheic dermatitis: Secondary | ICD-10-CM | POA: Diagnosis not present

## 2022-10-22 DIAGNOSIS — I872 Venous insufficiency (chronic) (peripheral): Secondary | ICD-10-CM | POA: Diagnosis not present

## 2022-11-02 DIAGNOSIS — H353133 Nonexudative age-related macular degeneration, bilateral, advanced atrophic without subfoveal involvement: Secondary | ICD-10-CM | POA: Diagnosis not present

## 2022-11-02 DIAGNOSIS — H43813 Vitreous degeneration, bilateral: Secondary | ICD-10-CM | POA: Diagnosis not present

## 2022-11-02 DIAGNOSIS — H35372 Puckering of macula, left eye: Secondary | ICD-10-CM | POA: Diagnosis not present

## 2022-11-02 DIAGNOSIS — H35033 Hypertensive retinopathy, bilateral: Secondary | ICD-10-CM | POA: Diagnosis not present

## 2022-11-17 ENCOUNTER — Other Ambulatory Visit: Payer: Self-pay | Admitting: Nurse Practitioner

## 2022-11-17 DIAGNOSIS — I10 Essential (primary) hypertension: Secondary | ICD-10-CM

## 2022-11-23 ENCOUNTER — Ambulatory Visit: Payer: 59 | Admitting: Adult Health

## 2022-11-23 ENCOUNTER — Encounter: Payer: Self-pay | Admitting: Adult Health

## 2022-11-23 VITALS — BP 115/78 | HR 71 | Temp 97.1°F | Resp 18 | Ht 64.0 in | Wt 103.2 lb

## 2022-11-23 DIAGNOSIS — K6289 Other specified diseases of anus and rectum: Secondary | ICD-10-CM

## 2022-11-23 MED ORDER — METAMUCIL SMOOTH TEXTURE 58.6 % PO POWD
1.0000 | Freq: Every day | ORAL | Status: DC
Start: 2022-11-23 — End: 2023-03-01

## 2022-11-23 NOTE — Progress Notes (Signed)
[facility] clinic  Provider:  Kenard Gower DNP  Code Status:  Full Code  Goals of Care:     11/23/2022    1:29 PM  Advanced Directives  Does Patient Have a Medical Advance Directive? No  Would patient like information on creating a medical advance directive? No - Patient declined     Chief Complaint  Patient presents with   Acute Visit    painful rectum area     HPI: Patient is a 87 y.o. female seen today for an acute visit for painful rectum area. She stated that she has  rectal pain for a while now and aware she has hemorrhoids. She woke up in the middle of the night, for 2 nights, in pain. She stated that she makes a "dump" when she moves her bowels. She verbalized feelings constipated.   External area no noted hemorrhoids nor dry blood. She wears pull ups for incontinence.   Past Medical History:  Diagnosis Date   Age-related macular degeneration, dry, both eyes    Alcoholism (HCC)    Anemia    Anxiety    Cholelithiasis    Endometriosis    Goiter    Herpes simplex    Hyperlipidemia    Hypertension    Loss of weight    Nontoxic multinodular goiter    Other abnormal blood chemistry    PUD (peptic ulcer disease)    Renal cyst    Sciatica    Scoliosis (and kyphoscoliosis), idiopathic    Unspecified disorder of kidney and ureter    Urinary frequency     Past Surgical History:  Procedure Laterality Date   ABDOMINAL HYSTERECTOMY  1971   CATARACT EXTRACTION W/ INTRAOCULAR LENS  IMPLANT, BILATERAL Bilateral 2016   Dr. Jethro Bolus   IRRIGATION AND DEBRIDEMENT ABSCESS Left 02/23/2017   Procedure: IRRIGATION AND DEBRIDEMENT ABSCESS;  Surgeon: Kathryne Hitch, MD;  Location: [facility] OR;  Service: Orthopedics;  Laterality: Left;   REPAIR OF PERFORATED ULCER  1973 &1975   Dr Rolene Course   STOMACH SURGERY  [phone]   removed 1/2 stomach; "had a total of 5 ORs from 1973-1980; all related to perforated ulcer"   TONSILLECTOMY      Allergies  Allergen Reactions    Cymbalta [Duloxetine Hcl] Diarrhea    Outpatient Encounter Medications as of 11/23/2022  Medication Sig   acyclovir ointment (ZOVIRAX) 5 % Apply 1 Application topically as needed.   amLODipine-valsartan (EXFORGE) 10-320 MG tablet TAKE 1 TABLET BY MOUTH EVERY DAY   CRANBERRY PO Take by mouth.   gabapentin (NEURONTIN) 300 MG capsule TAKE 1 CAPSULE(300 MG) BY MOUTH AT BEDTIME   GEMTESA 75 MG TABS Take 1 tablet (75 mg total) by mouth daily.   liver oil-zinc oxide (DESITIN) 40 % ointment Apply 1 Application topically daily.   LORazepam (ATIVAN) 0.5 MG tablet TAKE 1 TABLET(0.5 MG) BY MOUTH TWICE DAILY   Multiple Vitamins-Minerals (PRESERVISION AREDS 2 PO) Take by mouth daily.   nebivolol (BYSTOLIC) 10 MG tablet TAKE 1/2 TABLET(5 MG) BY MOUTH DAILY   omeprazole (PRILOSEC) 20 MG capsule TAKE 1 CAPSULE(20 MG) BY MOUTH TWICE DAILY BEFORE A MEAL   triamcinolone cream (KENALOG) 0.1 % Apply 1 Application topically 2 (two) times daily.   No facility-administered encounter medications on file as of 11/23/2022.    Review of Systems:  Review of Systems  Constitutional:  Negative for appetite change, chills, fatigue and fever.  HENT:  Negative for congestion, hearing loss, rhinorrhea and sore throat.  Eyes: Negative.   Respiratory:  Negative for cough, shortness of breath and wheezing.   Cardiovascular:  Negative for chest pain, palpitations and leg swelling.  Gastrointestinal:  Positive for constipation and rectal pain. Negative for abdominal pain, diarrhea, nausea and vomiting.  Genitourinary:  Negative for dysuria.  Musculoskeletal:  Negative for arthralgias, back pain and myalgias.  Skin:  Negative for color change, rash and wound.  Neurological:  Negative for dizziness, weakness and headaches.  Psychiatric/Behavioral:  Negative for behavioral problems. The patient is not nervous/anxious.     Health Maintenance  Topic Date Due   COVID-19 Vaccine (6 - 2023-24 season) 10/18/2022   Pneumonia  Vaccine 73+ Years old (1 of 2 - PCV) 02/03/2023 (Originally 10/17/1941)   Medicare Annual Wellness (AWV)  06/22/2023   Zoster Vaccines- Shingrix  Completed   HPV VACCINES  Aged Out   DTaP/Tdap/Td  Discontinued    Physical Exam: Vitals:   11/23/22 1136  BP: 115/78  Pulse: 71  Resp: 18  Temp: (!) 97.1 F (36.2 C)  SpO2: 96%  Weight: 103 lb 3.2 oz (46.8 kg)  Height: 5\' 4"  (1.626 m)   Body mass index is 17.71 kg/m. Physical Exam Constitutional:      Appearance: Normal appearance.  HENT:     Head: Normocephalic and atraumatic.     Nose: Nose normal.     Mouth/Throat:     Mouth: Mucous membranes are moist.  Eyes:     Conjunctiva/sclera: Conjunctivae normal.  Cardiovascular:     Rate and Rhythm: Normal rate and regular rhythm.  Pulmonary:     Effort: Pulmonary effort is normal.     Breath sounds: Normal breath sounds.  Abdominal:     General: Bowel sounds are normal.     Palpations: Abdomen is soft.  Genitourinary:    Rectum: Normal.  Musculoskeletal:        General: Normal range of motion.     Cervical back: Normal range of motion.  Skin:    General: Skin is warm and dry.  Neurological:     General: No focal deficit present.     Mental Status: She is alert and oriented to person, place, and time.  Psychiatric:        Mood and Affect: Mood normal.        Behavior: Behavior normal.        Thought Content: Thought content normal.        Judgment: Judgment normal.     Labs reviewed: Basic Metabolic Panel: Recent Labs    02/03/22 1118 08/03/22 1352  NA 138 134*  K 4.1 5.0  CL 109 100  CO2 19* 22  GLUCOSE 113* 163*  BUN 19 24  CREATININE 1.53* 1.52*  CALCIUM 9.1 9.5   Liver Function Tests: Recent Labs    02/03/22 1118 08/03/22 1352  AST 23 31  ALT 19 17  BILITOT 0.4 0.3  PROT 6.6 6.7   No results for input(s): "LIPASE", "AMYLASE" in the last 8760 hours. No results for input(s): "AMMONIA" in the last 8760 hours. CBC: Recent Labs     02/03/22 1118 08/03/22 1352  WBC 7.4 8.4  NEUTROABS 4,632 5,326  HGB 11.7 11.4*  HCT 35.5 35.3  MCV 87.4 89.1  PLT 289 294   Lipid Panel: Recent Labs    08/03/22 1453  CHOL 208*  HDL 140  LDLCALC 49  TRIG 161  CHOLHDL 1.5   Lab Results  Component Value Date   HGBA1C 6.0 (H) 08/03/2022  Procedures since last visit: No results found.  Assessment/Plan  1. Rectal pain -  no noted external hemorrhoids -  possibly due to constipation and irritation from incontinence -  change pull ups immediately when wet -  instructed to start taking Metamucil daily and increase vegetable intake - liver oil-zinc oxide (DESITIN) 40 % ointment; Apply 1 Application topically daily. - psyllium (METAMUCIL SMOOTH TEXTURE) 58.6 % powder; Take 1 packet by mouth daily.     Labs/tests ordered:  None  Next appt:  Visit date not found

## 2022-12-30 DIAGNOSIS — L218 Other seborrheic dermatitis: Secondary | ICD-10-CM | POA: Diagnosis not present

## 2022-12-30 DIAGNOSIS — R202 Paresthesia of skin: Secondary | ICD-10-CM | POA: Diagnosis not present

## 2023-01-21 ENCOUNTER — Other Ambulatory Visit: Payer: Self-pay | Admitting: Nurse Practitioner

## 2023-01-21 DIAGNOSIS — I10 Essential (primary) hypertension: Secondary | ICD-10-CM

## 2023-02-01 ENCOUNTER — Other Ambulatory Visit: Payer: Self-pay | Admitting: Orthopedic Surgery

## 2023-02-01 DIAGNOSIS — F419 Anxiety disorder, unspecified: Secondary | ICD-10-CM

## 2023-02-01 NOTE — Telephone Encounter (Signed)
Patient has request refill on medication Lorazepam. Patient medication last refilled 08/06/2022. Patient has Non Opioid Contract on file dated 08/01/2021. Patient has no upcoming appointments. Medication pend and sent to PCP Janyth Contes Janene Harvey, NP

## 2023-02-15 ENCOUNTER — Other Ambulatory Visit: Payer: Self-pay | Admitting: Nurse Practitioner

## 2023-02-15 ENCOUNTER — Encounter: Payer: Self-pay | Admitting: Family

## 2023-02-15 ENCOUNTER — Ambulatory Visit
Admission: RE | Admit: 2023-02-15 | Discharge: 2023-02-15 | Disposition: A | Discharge status: Home / Self Care | Payer: 59 | Source: Ambulatory Visit | Attending: Family | Admitting: Family

## 2023-02-15 ENCOUNTER — Ambulatory Visit (INDEPENDENT_AMBULATORY_CARE_PROVIDER_SITE_OTHER): Payer: 59 | Admitting: Family

## 2023-02-15 VITALS — BP 110/70 | HR 64 | Temp 97.9°F | Resp 17 | Ht 64.0 in | Wt 103.6 lb

## 2023-02-15 DIAGNOSIS — R109 Unspecified abdominal pain: Secondary | ICD-10-CM | POA: Diagnosis not present

## 2023-02-15 DIAGNOSIS — I10 Essential (primary) hypertension: Secondary | ICD-10-CM

## 2023-02-15 DIAGNOSIS — R1030 Lower abdominal pain, unspecified: Secondary | ICD-10-CM | POA: Diagnosis not present

## 2023-02-15 NOTE — Progress Notes (Signed)
Provider: Josias Tomerlin FNP-C  Sharon Seller, NP  Patient Care Team: Sharon Seller, NP as PCP - General (Geriatric Medicine) Jethro Bolus, MD as Consulting Physician (Ophthalmology) Annamaria Helling, MD as Consulting Physician (Obstetrics and Gynecology) Janalyn Harder, MD (Inactive) as Consulting Physician (Dermatology)  Extended Emergency Contact Information Primary Emergency Contact: Martha Clan [place] of [place] Phone: [phone] Relation: Friend Secondary Emergency Contact: Gerhard Perches Mobile Phone: [phone] Relation: Significant other  Code Status:  Full Code  Goals of care: Advanced Directive information    02/15/2023   11:02 AM  Advanced Directives  Does Patient Have a Medical Advance Directive? Yes  Type of Estate agent of [place];Living will  Does patient want to make changes to medical advance directive? No - Patient declined  Copy of Healthcare Power of Attorney in Chart? Yes - validated most recent copy scanned in chart (See row information)     Chief Complaint  Patient presents with   Acute Visit    HPI:  Pt is a 87 y.o. female seen today for an acute visit for evaluation of lower abdominal pain.States has urgency to move her bowels in the morning but can not go but once she drinks her tea symptoms improves.Has very loud bowel sounds all over the abdomen.sometimes has flatulence depending on what she eats. She denies any bloating,nausea or vomiting. Last Bowel movement was this morning.states has been regular with her bowels.Appetite is good.Has chronic GERD. States had Ronal Fear has been effective for OAB.    Past Medical History:  Diagnosis Date   Age-related macular degeneration, dry, both eyes    Alcoholism (HCC)    Anemia    Anxiety    Cholelithiasis    Endometriosis    Goiter    Herpes simplex    Hyperlipidemia    Hypertension    Loss of weight    Nontoxic multinodular goiter    Other  abnormal blood chemistry    PUD (peptic ulcer disease)    Renal cyst    Sciatica    Scoliosis (and kyphoscoliosis), idiopathic    Unspecified disorder of kidney and ureter    Urinary frequency    Past Surgical History:  Procedure Laterality Date   ABDOMINAL HYSTERECTOMY  1971   CATARACT EXTRACTION W/ INTRAOCULAR LENS  IMPLANT, BILATERAL Bilateral 2016   Dr. Jethro Bolus   IRRIGATION AND DEBRIDEMENT ABSCESS Left 02/23/2017   Procedure: IRRIGATION AND DEBRIDEMENT ABSCESS;  Surgeon: Kathryne Hitch, MD;  Location: [facility] OR;  Service: Orthopedics;  Laterality: Left;   REPAIR OF PERFORATED ULCER  1973 &1975   Dr Rolene Course   STOMACH SURGERY  [phone]   removed 1/2 stomach; "had a total of 5 ORs from 1973-1980; all related to perforated ulcer"   TONSILLECTOMY      Allergies  Allergen Reactions   Cymbalta [Duloxetine Hcl] Diarrhea    Outpatient Encounter Medications as of 02/15/2023  Medication Sig   amLODipine-valsartan (EXFORGE) 10-320 MG tablet TAKE 1 TABLET BY MOUTH EVERY DAY   CRANBERRY PO Take by mouth.   gabapentin (NEURONTIN) 300 MG capsule TAKE 1 CAPSULE(300 MG) BY MOUTH AT BEDTIME   GEMTESA 75 MG TABS Take 1 tablet (75 mg total) by mouth daily.   liver oil-zinc oxide (DESITIN) 40 % ointment Apply 1 Application topically daily.   LORazepam (ATIVAN) 0.5 MG tablet TAKE 1 TABLET(0.5 MG) BY MOUTH TWICE DAILY- needs follow up prior to additional refills   Multiple Vitamins-Minerals (PRESERVISION AREDS 2 PO) Take by  mouth daily.   nebivolol (BYSTOLIC) 10 MG tablet TAKE 1/2 TABLET(5 MG) BY MOUTH DAILY   omeprazole (PRILOSEC) 20 MG capsule TAKE 1 CAPSULE(20 MG) BY MOUTH TWICE DAILY BEFORE A MEAL   psyllium (METAMUCIL SMOOTH TEXTURE) 58.6 % powder Take 1 packet by mouth daily.   triamcinolone cream (KENALOG) 0.1 % Apply 1 Application topically 2 (two) times daily.   acyclovir ointment (ZOVIRAX) 5 % Apply 1 Application topically as needed. (Patient not taking: Reported on 02/15/2023)    No facility-administered encounter medications on file as of 02/15/2023.    Review of Systems  Constitutional:  Negative for appetite change, chills, fatigue, fever and unexpected weight change.  HENT:  Negative for congestion, dental problem, ear discharge, ear pain, facial swelling, hearing loss, nosebleeds, postnasal drip, rhinorrhea, sinus pressure, sinus pain, sneezing, sore throat, tinnitus and trouble swallowing.   Eyes:  Negative for pain, discharge, redness, itching and visual disturbance.  Respiratory:  Negative for cough, chest tightness, shortness of breath and wheezing.   Cardiovascular:  Negative for chest pain, palpitations and leg swelling.  Gastrointestinal:  Positive for abdominal pain. Negative for abdominal distention, blood in stool, constipation, diarrhea, nausea and vomiting.  Endocrine: Negative for cold intolerance, heat intolerance, polydipsia, polyphagia and polyuria.  Genitourinary:  Negative for difficulty urinating, dysuria, flank pain, frequency and urgency.  Musculoskeletal:  Negative for arthralgias, back pain, gait problem, joint swelling, myalgias, neck pain and neck stiffness.  Skin:  Negative for color change, pallor, rash and wound.  Neurological:  Negative for dizziness, syncope, speech difficulty, weakness, light-headedness, numbness and headaches.  Hematological:  Does not bruise/bleed easily.  Psychiatric/Behavioral:  Negative for agitation, behavioral problems, confusion, hallucinations, self-injury, sleep disturbance and suicidal ideas. The patient is not nervous/anxious.     Immunization History  Administered Date(s) Administered   PFIZER Comirnaty(Gray Top)Covid-19 Tri-Sucrose Vaccine 11/04/2020, 12/11/2021   PFIZER(Purple Top)SARS-COV-2 Vaccination 03/30/2019, 04/24/2019, 02/05/2020   Zoster Recombinant(Shingrix) 09/30/2020, 12/02/2020   There are no preventive care reminders to display for this patient.    04/13/2022    2:17 PM 06/22/2022    11:09 AM 08/03/2022    1:21 PM 11/23/2022    1:29 PM 02/15/2023   11:02 AM  Fall Risk  Falls in the past year? 0 0 0 0 0  Was there an injury with Fall? 0 0 0 0 0  Fall Risk Category Calculator 0 0 0 0 0  Patient at Risk for Falls Due to No Fall Risks No Fall Risks No Fall Risks No Fall Risks   Fall risk Follow up Falls evaluation completed Falls evaluation completed Falls evaluation completed Falls evaluation completed    Functional Status Survey:    Vitals:   02/15/23 1108  BP: 110/70  Pulse: 64  Resp: 17  Temp: 97.9 F (36.6 C)  SpO2: 98%  Weight: 103 lb 9.6 oz (47 kg)  Height: 5\' 4"  (1.626 m)   Body mass index is 17.78 kg/m. Physical Exam Vitals reviewed.  Constitutional:      General: She is not in acute distress.    Appearance: Normal appearance. She is normal weight. She is not ill-appearing or diaphoretic.  HENT:     Head: Normocephalic.     Right Ear: Tympanic membrane, ear canal and external ear normal. There is no impacted cerumen.     Left Ear: Tympanic membrane, ear canal and external ear normal. There is no impacted cerumen.     Nose: Nose normal. No congestion or rhinorrhea.  Mouth/Throat:     Mouth: Mucous membranes are moist.     Pharynx: Oropharynx is clear. No oropharyngeal exudate or posterior oropharyngeal erythema.  Eyes:     General: No scleral icterus.       Right eye: No discharge.        Left eye: No discharge.     Extraocular Movements: Extraocular movements intact.     Conjunctiva/sclera: Conjunctivae normal.     Pupils: Pupils are equal, round, and reactive to light.  Neck:     Vascular: No carotid bruit.  Cardiovascular:     Rate and Rhythm: Normal rate and regular rhythm.     Pulses: Normal pulses.     Heart sounds: Normal heart sounds. No murmur heard.    No friction rub. No gallop.  Pulmonary:     Effort: Pulmonary effort is normal. No respiratory distress.     Breath sounds: Normal breath sounds. No wheezing, rhonchi or  rales.  Chest:     Chest wall: No tenderness.  Abdominal:     General: Bowel sounds are increased. There is no distension.     Palpations: Abdomen is soft. There is no mass.     Tenderness: There is no abdominal tenderness. There is no right CVA tenderness, left CVA tenderness, guarding or rebound.  Musculoskeletal:        General: No swelling or tenderness. Normal range of motion.     Cervical back: Normal range of motion. No rigidity or tenderness.     Right lower leg: No edema.     Left lower leg: No edema.  Lymphadenopathy:     Cervical: No cervical adenopathy.  Skin:    General: Skin is warm and dry.     Coloration: Skin is not pale.     Findings: No bruising, erythema, lesion or rash.  Neurological:     Mental Status: She is alert and oriented to person, place, and time.     Cranial Nerves: No cranial nerve deficit.     Sensory: No sensory deficit.     Motor: No weakness.     Coordination: Coordination normal.     Gait: Gait normal.  Psychiatric:        Mood and Affect: Mood normal.        Speech: Speech normal.        Behavior: Behavior normal.        Thought Content: Thought content normal.        Judgment: Judgment normal.     Labs reviewed: Recent Labs    08/03/22 1352  NA 134*  K 5.0  CL 100  CO2 22  GLUCOSE 163*  BUN 24  CREATININE 1.52*  CALCIUM 9.5   Recent Labs    08/03/22 1352  AST 31  ALT 17  BILITOT 0.3  PROT 6.7   Recent Labs    08/03/22 1352  WBC 8.4  NEUTROABS 5,326  HGB 11.4*  HCT 35.3  MCV 89.1  PLT 294   Lab Results  Component Value Date   TSH 0.79 01/16/2021   Lab Results  Component Value Date   HGBA1C 6.0 (H) 08/03/2022   Lab Results  Component Value Date   CHOL 208 (H) 08/03/2022   HDL 140 08/03/2022   LDLCALC 49 08/03/2022   TRIG 108 08/03/2022   CHOLHDL 1.5 08/03/2022    Significant Diagnostic Results in last 30 days:  No results found.  Assessment/Plan  Lower abdominal pain (Primary) Abdomen  non-tender to palpation,none  distended with Hyperactive bowel sounds x 4 quadrant  - encourage to increase water intake  - will obtain imaging per patient request thinks there's something on her lower abdomen. Previous imaging indicated constipation.  - Advised to get abdominal X-ray at [facility] imaging at [facility] then will call you with results. - Notify provider or go to ED for any nausea,vomiting,abdominal distension or if symptoms worsen  - DG Abd 1 View  Family/ staff Communication: Reviewed plan of care with patient verbalized understanding   Labs/tests ordered:  - DG Abd 1 View  Next Appointment:Return in about 1 month (around 03/18/2023) for medical mangement of chronic issues with PCP Eubanks,NP .    Caesar Bookman, NP

## 2023-02-15 NOTE — Patient Instructions (Addendum)
-   Please get abdominal X-ray at [facility] imaging at [facility] then will call you with results. - Notify provider or go to ED for any nausea,vomiting,abdominal distension or if symptoms worsen

## 2023-02-18 ENCOUNTER — Telehealth: Payer: Self-pay

## 2023-02-18 NOTE — Telephone Encounter (Signed)
 Still waiting for chest X-ray results.

## 2023-02-18 NOTE — Telephone Encounter (Signed)
 Patient has scheduled with you tomorrow she states she is not feeling any better

## 2023-02-18 NOTE — Telephone Encounter (Signed)
 Patient is calling in regards to her xray results. I told patient she would be notified when xray are resulted.

## 2023-02-18 NOTE — Telephone Encounter (Signed)
 Noted.

## 2023-02-19 ENCOUNTER — Encounter: Payer: Self-pay | Admitting: Family

## 2023-02-19 ENCOUNTER — Ambulatory Visit (INDEPENDENT_AMBULATORY_CARE_PROVIDER_SITE_OTHER): Payer: 59 | Admitting: Family

## 2023-02-19 VITALS — BP 108/68 | HR 74 | Temp 97.7°F | Resp 18 | Ht 64.0 in | Wt 104.6 lb

## 2023-02-19 DIAGNOSIS — F321 Major depressive disorder, single episode, moderate: Secondary | ICD-10-CM | POA: Diagnosis not present

## 2023-02-19 DIAGNOSIS — R6889 Other general symptoms and signs: Secondary | ICD-10-CM

## 2023-02-19 DIAGNOSIS — R300 Dysuria: Secondary | ICD-10-CM

## 2023-02-19 DIAGNOSIS — R5383 Other fatigue: Secondary | ICD-10-CM

## 2023-02-19 LAB — POCT URINALYSIS DIPSTICK
Bilirubin, UA: NEGATIVE
Glucose, UA: NEGATIVE
Ketones, UA: NEGATIVE
Nitrite, UA: NEGATIVE
Protein, UA: POSITIVE — AB
Spec Grav, UA: 1.01 (ref 1.010–1.025)
Urobilinogen, UA: 0.2 U/dL
pH, UA: 6 (ref 5.0–8.0)

## 2023-02-19 NOTE — Progress Notes (Signed)
 Provider: Oran Dillenburg FNP-C  Caro Harlene POUR, NP  Patient Care Team: Caro Harlene POUR, NP as PCP - General (Geriatric Medicine) Roz Anes, MD as Consulting Physician (Ophthalmology) Rox Charleston, MD as Consulting Physician (Obstetrics and Gynecology) Livingston Rigg, MD (Inactive) as Consulting Physician (Dermatology)  Extended Emergency Contact Information Primary Emergency Contact: Kennyth Barnie Feil  of [place] Mobile Phone: [phone] Relation: Friend Secondary Emergency Contact: Wayt,Merritt Mobile Phone: [phone] Relation: Significant other  Code Status:  Full Code  Goals of care: Advanced Directive information    02/19/2023    1:06 PM  Advanced Directives  Does Patient Have a Medical Advance Directive? Yes  Type of Estate Agent of [place];Living will  Does patient want to make changes to medical advance directive? No - Patient declined  Copy of Healthcare Power of Attorney in Chart? Yes - validated most recent copy scanned in chart (See row information)     Chief Complaint  Patient presents with   Acute Visit    Follow up and patient feeling worse.    HPI:  Pt is a 88 y.o. female seen today for an acute visit for evaluation of feeling bad.she is here with her boyfriend. Has been sleeping 10-12 hrs, feels tired,has chills which she describes as on the  inside.Feels like just curling up and cocooning. States feels very depressed but does not want to take any medication. States bowel moving without any difficulty.Has felt nauseated but no vomiting.  Has acid reflex gives her horrible taste in the mouth.Omeprazole  effective. States went to visit her friend who moved to a long term facility.Boy friend states has not stopped thinking about the friend  It can't be happening.   Past Medical History:  Diagnosis Date   Age-related macular degeneration, dry, both eyes    Alcoholism (HCC)    Anemia    Anxiety     Cholelithiasis    Endometriosis    Goiter    Herpes simplex    Hyperlipidemia    Hypertension    Loss of weight    Nontoxic multinodular goiter    Other abnormal blood chemistry    PUD (peptic ulcer disease)    Renal cyst    Sciatica    Scoliosis (and kyphoscoliosis), idiopathic    Unspecified disorder of kidney and ureter    Urinary frequency    Past Surgical History:  Procedure Laterality Date   ABDOMINAL HYSTERECTOMY  1971   CATARACT EXTRACTION W/ INTRAOCULAR LENS  IMPLANT, BILATERAL Bilateral 2016   Dr. Anes Roz   IRRIGATION AND DEBRIDEMENT ABSCESS Left 02/23/2017   Procedure: IRRIGATION AND DEBRIDEMENT ABSCESS;  Surgeon: Vernetta Lonni GRADE, MD;  Location: [facility] OR;  Service: Orthopedics;  Laterality: Left;   REPAIR OF PERFORATED ULCER  1973 &1975   Dr Jannetta   STOMACH SURGERY  [phone]   removed 1/2 stomach; had a total of 5 ORs from 1973-1980; all related to perforated ulcer   TONSILLECTOMY      Allergies  Allergen Reactions   Cymbalta  [Duloxetine  Hcl] Diarrhea    Outpatient Encounter Medications as of 02/19/2023  Medication Sig   amLODipine -valsartan  (EXFORGE ) 10-320 MG tablet TAKE 1 TABLET BY MOUTH EVERY DAY   CRANBERRY PO Take by mouth.   gabapentin  (NEURONTIN ) 300 MG capsule TAKE 1 CAPSULE(300 MG) BY MOUTH AT BEDTIME   GEMTESA  75 MG TABS Take 1 tablet (75 mg total) by mouth daily.   liver oil-zinc  oxide (DESITIN) 40 % ointment Apply 1 Application topically daily.  LORazepam  (ATIVAN ) 0.5 MG tablet TAKE 1 TABLET(0.5 MG) BY MOUTH TWICE DAILY- needs follow up prior to additional refills   Multiple Vitamins-Minerals (PRESERVISION AREDS 2 PO) Take by mouth daily.   nebivolol  (BYSTOLIC ) 10 MG tablet TAKE 1/2 TABLET(5 MG) BY MOUTH DAILY   omeprazole  (PRILOSEC) 20 MG capsule TAKE 1 CAPSULE(20 MG) BY MOUTH TWICE DAILY BEFORE A MEAL   psyllium (METAMUCIL SMOOTH TEXTURE) 58.6 % powder Take 1 packet by mouth daily.   triamcinolone  cream (KENALOG ) 0.1 % Apply 1  Application topically 2 (two) times daily.   No facility-administered encounter medications on file as of 02/19/2023.    Review of Systems  Constitutional:  Positive for fatigue. Negative for appetite change, chills, fever and unexpected weight change.  HENT:  Negative for congestion, dental problem, ear discharge, ear pain, facial swelling, hearing loss, nosebleeds, postnasal drip, rhinorrhea, sinus pressure, sinus pain, sneezing, sore throat, tinnitus and trouble swallowing.   Eyes:  Negative for pain, discharge, redness, itching and visual disturbance.  Respiratory:  Negative for cough, chest tightness, shortness of breath and wheezing.   Cardiovascular:  Negative for chest pain, palpitations and leg swelling.  Gastrointestinal:  Positive for nausea. Negative for abdominal distention, abdominal pain, blood in stool, constipation, diarrhea and vomiting.  Endocrine: Positive for cold intolerance. Negative for heat intolerance, polydipsia, polyphagia and polyuria.  Genitourinary:  Positive for dysuria. Negative for difficulty urinating, flank pain, frequency and urgency.  Musculoskeletal:  Negative for arthralgias, back pain, gait problem, joint swelling, myalgias, neck pain and neck stiffness.  Skin:  Negative for color change, pallor, rash and wound.  Neurological:  Negative for dizziness, syncope, speech difficulty, weakness, light-headedness, numbness and headaches.  Hematological:  Does not bruise/bleed easily.  Psychiatric/Behavioral:  Negative for agitation, behavioral problems, confusion, hallucinations, self-injury, sleep disturbance and suicidal ideas. The patient is not nervous/anxious.        Sleeping 10-12 hrs  Reports feeling depressed     Immunization History  Administered Date(s) Administered   PFIZER Comirnaty(Gray Top)Covid-19 Tri-Sucrose Vaccine 11/04/2020, 12/11/2021   PFIZER(Purple Top)SARS-COV-2 Vaccination 03/30/2019, 04/24/2019, 02/05/2020   Zoster  Recombinant(Shingrix) 09/30/2020, 12/02/2020   There are no preventive care reminders to display for this patient.    06/22/2022   11:09 AM 08/03/2022    1:21 PM 11/23/2022    1:29 PM 02/15/2023   11:02 AM 02/19/2023    1:06 PM  Fall Risk  Falls in the past year? 0 0 0 0 0  Was there an injury with Fall? 0 0 0 0 0  Fall Risk Category Calculator 0 0 0 0 0  Patient at Risk for Falls Due to No Fall Risks No Fall Risks No Fall Risks    Fall risk Follow up Falls evaluation completed Falls evaluation completed Falls evaluation completed     Functional Status Survey:    Vitals:   02/19/23 1310  BP: 108/68  Pulse: 74  Resp: 18  Temp: 97.7 F (36.5 C)  SpO2: 99%  Weight: 104 lb 9.6 oz (47.4 kg)  Height: 5' 4 (1.626 m)   Body mass index is 17.95 kg/m. Physical Exam Vitals reviewed.  Constitutional:      General: She is not in acute distress.    Appearance: Normal appearance. She is underweight. She is not ill-appearing or diaphoretic.  HENT:     Head: Normocephalic.     Nose: Nose normal. No congestion or rhinorrhea.     Mouth/Throat:     Mouth: Mucous membranes are moist.  Pharynx: Oropharynx is clear. No oropharyngeal exudate or posterior oropharyngeal erythema.  Eyes:     General: No scleral icterus.       Right eye: No discharge.        Left eye: No discharge.     Extraocular Movements: Extraocular movements intact.     Conjunctiva/sclera: Conjunctivae normal.     Pupils: Pupils are equal, round, and reactive to light.  Neck:     Vascular: No carotid bruit.  Cardiovascular:     Rate and Rhythm: Normal rate and regular rhythm.     Pulses: Normal pulses.     Heart sounds: Normal heart sounds. No murmur heard.    No friction rub. No gallop.  Pulmonary:     Effort: Pulmonary effort is normal. No respiratory distress.     Breath sounds: Normal breath sounds. No wheezing, rhonchi or rales.  Chest:     Chest wall: No tenderness.  Abdominal:     General: Bowel sounds  are normal. There is no distension.     Palpations: Abdomen is soft. There is no mass.     Tenderness: There is abdominal tenderness in the right lower quadrant, suprapubic area and left lower quadrant. There is no right CVA tenderness, left CVA tenderness, guarding or rebound.  Musculoskeletal:        General: No swelling or tenderness. Normal range of motion.     Cervical back: Normal range of motion. No rigidity or tenderness.     Right lower leg: No edema.     Left lower leg: No edema.  Lymphadenopathy:     Cervical: No cervical adenopathy.  Skin:    General: Skin is warm and dry.     Coloration: Skin is not pale.     Findings: No bruising, erythema, lesion or rash.  Neurological:     Mental Status: She is alert and oriented to person, place, and time.     Cranial Nerves: No cranial nerve deficit.     Sensory: No sensory deficit.     Motor: No weakness.     Coordination: Coordination normal.     Gait: Gait normal.  Psychiatric:        Mood and Affect: Mood is depressed.        Speech: Speech normal.        Behavior: Behavior is agitated.        Thought Content: Thought content normal.        Judgment: Judgment normal.     Labs reviewed: Recent Labs    08/03/22 1352  NA 134*  K 5.0  CL 100  CO2 22  GLUCOSE 163*  BUN 24  CREATININE 1.52*  CALCIUM 9.5   Recent Labs    08/03/22 1352  AST 31  ALT 17  BILITOT 0.3  PROT 6.7   Recent Labs    08/03/22 1352  WBC 8.4  NEUTROABS 5,326  HGB 11.4*  HCT 35.3  MCV 89.1  PLT 294   Lab Results  Component Value Date   TSH 0.79 01/16/2021   Lab Results  Component Value Date   HGBA1C 6.0 (H) 08/03/2022   Lab Results  Component Value Date   CHOL 208 (H) 08/03/2022   HDL 140 08/03/2022   LDLCALC 49 08/03/2022   TRIG 108 08/03/2022   CHOLHDL 1.5 08/03/2022    Significant Diagnostic Results in last 30 days:  No results found.  Assessment/Plan 1. Fatigue, unspecified type Afebrile  Suspect due to  depression symptoms  but will rule out other acute and metabolic etiologies. - CBC with Differential/Platelet - COMPLETE METABOLIC PANEL WITH GFR - TSH  2. Current moderate episode of major depressive disorder, unspecified whether recurrent (HCC) Reports severe depression but decline to start on antidepressant  - Her long term friend recently moved to long term care facility  - advised to notify provider if antidepressant desired then will send to pharmacy. No SI/IO   3. Cold intolerance Feels chills on the inside Wearing layers of clothing  - TSH  4. Dysuria (Primary) Lower abdominal tender to  - Urine Culture - POC Urinalysis Dipstick    Family/ staff Communication: Reviewed plan of care with patient verbalized   Labs/tests ordered:  - CBC with Differential/Platelet - COMPLETE METABOLIC PANEL WITH GFR - TSH  - Urine Culture - POC Urinalysis Dipstick  Next Appointment: Return if symptoms worsen or fail to improve.   Roxan JAYSON Plough, NP

## 2023-02-21 LAB — CBC WITH DIFFERENTIAL/PLATELET
Absolute Lymphocytes: 707 {cells}/uL — ABNORMAL LOW (ref 850–3900)
Absolute Monocytes: 2210 {cells}/uL — ABNORMAL HIGH (ref 200–950)
Basophils Absolute: 44 {cells}/uL (ref 0–200)
Basophils Relative: 0.2 %
Eosinophils Absolute: 4508 {cells}/uL — ABNORMAL HIGH (ref 15–500)
Eosinophils Relative: 20.4 %
HCT: 31.6 % — ABNORMAL LOW (ref 35.0–45.0)
Hemoglobin: 10 g/dL — ABNORMAL LOW (ref 11.7–15.5)
MCH: 26.1 pg — ABNORMAL LOW (ref 27.0–33.0)
MCHC: 31.6 g/dL — ABNORMAL LOW (ref 32.0–36.0)
MCV: 82.5 fL (ref 80.0–100.0)
MPV: 10.9 fL (ref 7.5–12.5)
Monocytes Relative: 10 %
Neutro Abs: 14630 {cells}/uL — ABNORMAL HIGH (ref 1500–7800)
Neutrophils Relative %: 66.2 %
Platelets: 213 10*3/uL (ref 140–400)
RBC: 3.83 10*6/uL (ref 3.80–5.10)
RDW: 13.7 % (ref 11.0–15.0)
Total Lymphocyte: 3.2 %
WBC: 22.1 10*3/uL — ABNORMAL HIGH (ref 3.8–10.8)

## 2023-02-21 LAB — COMPLETE METABOLIC PANEL WITH GFR
AG Ratio: 1.5 (calc) (ref 1.0–2.5)
ALT: 13 U/L (ref 6–29)
AST: 20 U/L (ref 10–35)
Albumin: 3.5 g/dL — ABNORMAL LOW (ref 3.6–5.1)
Alkaline phosphatase (APISO): 134 U/L (ref 37–153)
BUN/Creatinine Ratio: 14 (calc) (ref 6–22)
BUN: 35 mg/dL — ABNORMAL HIGH (ref 7–25)
CO2: 21 mmol/L (ref 20–32)
Calcium: 8.9 mg/dL (ref 8.6–10.4)
Chloride: 97 mmol/L — ABNORMAL LOW (ref 98–110)
Creat: 2.49 mg/dL — ABNORMAL HIGH (ref 0.60–0.95)
Globulin: 2.4 g/dL (ref 1.9–3.7)
Glucose, Bld: 117 mg/dL (ref 65–139)
Potassium: 4.1 mmol/L (ref 3.5–5.3)
Sodium: 132 mmol/L — ABNORMAL LOW (ref 135–146)
Total Bilirubin: 0.5 mg/dL (ref 0.2–1.2)
Total Protein: 5.9 g/dL — ABNORMAL LOW (ref 6.1–8.1)
eGFR: 18 mL/min/{1.73_m2} — ABNORMAL LOW (ref >=60)

## 2023-02-21 LAB — URINE CULTURE
MICRO NUMBER:: 15917327
SPECIMEN QUALITY:: ADEQUATE

## 2023-02-21 LAB — TSH: TSH: 0.4 m[IU]/L (ref 0.40–4.50)

## 2023-02-22 ENCOUNTER — Emergency Department (HOSPITAL_COMMUNITY): Payer: 59

## 2023-02-22 ENCOUNTER — Other Ambulatory Visit: Payer: Self-pay

## 2023-02-22 ENCOUNTER — Ambulatory Visit (INDEPENDENT_AMBULATORY_CARE_PROVIDER_SITE_OTHER): Payer: 59 | Admitting: Nurse Practitioner

## 2023-02-22 ENCOUNTER — Encounter: Payer: Self-pay | Admitting: Nurse Practitioner

## 2023-02-22 ENCOUNTER — Inpatient Hospital Stay (HOSPITAL_COMMUNITY)
Admission: EM | Admit: 2023-02-22 | Discharge: 2023-03-01 | DRG: 682 | Disposition: A | Discharge status: Hospice | Payer: 59 | Attending: Family Medicine | Admitting: Family Medicine

## 2023-02-22 ENCOUNTER — Encounter (HOSPITAL_COMMUNITY): Payer: Self-pay | Admitting: *Deleted

## 2023-02-22 VITALS — BP 114/70 | HR 73 | Temp 97.7°F | Resp 18 | Ht 64.0 in | Wt 103.8 lb

## 2023-02-22 DIAGNOSIS — H353130 Nonexudative age-related macular degeneration, bilateral, stage unspecified: Secondary | ICD-10-CM | POA: Diagnosis present

## 2023-02-22 DIAGNOSIS — R627 Adult failure to thrive: Secondary | ICD-10-CM | POA: Diagnosis present

## 2023-02-22 DIAGNOSIS — K769 Liver disease, unspecified: Secondary | ICD-10-CM | POA: Diagnosis present

## 2023-02-22 DIAGNOSIS — Z8711 Personal history of peptic ulcer disease: Secondary | ICD-10-CM

## 2023-02-22 DIAGNOSIS — D509 Iron deficiency anemia, unspecified: Secondary | ICD-10-CM | POA: Diagnosis present

## 2023-02-22 DIAGNOSIS — E538 Deficiency of other specified B group vitamins: Secondary | ICD-10-CM | POA: Diagnosis present

## 2023-02-22 DIAGNOSIS — Z79899 Other long term (current) drug therapy: Secondary | ICD-10-CM

## 2023-02-22 DIAGNOSIS — Z9842 Cataract extraction status, left eye: Secondary | ICD-10-CM

## 2023-02-22 DIAGNOSIS — B9620 Unspecified Escherichia coli [E. coli] as the cause of diseases classified elsewhere: Secondary | ICD-10-CM | POA: Diagnosis present

## 2023-02-22 DIAGNOSIS — R591 Generalized enlarged lymph nodes: Secondary | ICD-10-CM | POA: Diagnosis present

## 2023-02-22 DIAGNOSIS — Z961 Presence of intraocular lens: Secondary | ICD-10-CM | POA: Diagnosis present

## 2023-02-22 DIAGNOSIS — R7881 Bacteremia: Secondary | ICD-10-CM | POA: Diagnosis present

## 2023-02-22 DIAGNOSIS — R262 Difficulty in walking, not elsewhere classified: Secondary | ICD-10-CM | POA: Diagnosis present

## 2023-02-22 DIAGNOSIS — R0602 Shortness of breath: Secondary | ICD-10-CM | POA: Diagnosis not present

## 2023-02-22 DIAGNOSIS — I10 Essential (primary) hypertension: Secondary | ICD-10-CM | POA: Diagnosis not present

## 2023-02-22 DIAGNOSIS — Z681 Body mass index (BMI) 19 or less, adult: Secondary | ICD-10-CM | POA: Diagnosis not present

## 2023-02-22 DIAGNOSIS — B3781 Candidal esophagitis: Secondary | ICD-10-CM | POA: Diagnosis present

## 2023-02-22 DIAGNOSIS — E872 Acidosis, unspecified: Secondary | ICD-10-CM | POA: Diagnosis present

## 2023-02-22 DIAGNOSIS — J9601 Acute respiratory failure with hypoxia: Secondary | ICD-10-CM | POA: Diagnosis present

## 2023-02-22 DIAGNOSIS — J9811 Atelectasis: Secondary | ICD-10-CM | POA: Diagnosis not present

## 2023-02-22 DIAGNOSIS — E119 Type 2 diabetes mellitus without complications: Secondary | ICD-10-CM | POA: Diagnosis not present

## 2023-02-22 DIAGNOSIS — D649 Anemia, unspecified: Secondary | ICD-10-CM | POA: Diagnosis present

## 2023-02-22 DIAGNOSIS — N179 Acute kidney failure, unspecified: Principal | ICD-10-CM | POA: Diagnosis present

## 2023-02-22 DIAGNOSIS — Z7189 Other specified counseling: Secondary | ICD-10-CM

## 2023-02-22 DIAGNOSIS — B002 Herpesviral gingivostomatitis and pharyngotonsillitis: Secondary | ICD-10-CM | POA: Insufficient documentation

## 2023-02-22 DIAGNOSIS — K828 Other specified diseases of gallbladder: Secondary | ICD-10-CM | POA: Diagnosis not present

## 2023-02-22 DIAGNOSIS — E1122 Type 2 diabetes mellitus with diabetic chronic kidney disease: Secondary | ICD-10-CM | POA: Diagnosis present

## 2023-02-22 DIAGNOSIS — I129 Hypertensive chronic kidney disease with stage 1 through stage 4 chronic kidney disease, or unspecified chronic kidney disease: Secondary | ICD-10-CM | POA: Diagnosis present

## 2023-02-22 DIAGNOSIS — E871 Hypo-osmolality and hyponatremia: Secondary | ICD-10-CM | POA: Insufficient documentation

## 2023-02-22 DIAGNOSIS — N3000 Acute cystitis without hematuria: Secondary | ICD-10-CM

## 2023-02-22 DIAGNOSIS — R531 Weakness: Secondary | ICD-10-CM | POA: Diagnosis present

## 2023-02-22 DIAGNOSIS — R932 Abnormal findings on diagnostic imaging of liver and biliary tract: Secondary | ICD-10-CM | POA: Diagnosis not present

## 2023-02-22 DIAGNOSIS — N1832 Chronic kidney disease, stage 3b: Secondary | ICD-10-CM | POA: Diagnosis present

## 2023-02-22 DIAGNOSIS — E785 Hyperlipidemia, unspecified: Secondary | ICD-10-CM | POA: Diagnosis present

## 2023-02-22 DIAGNOSIS — Z515 Encounter for palliative care: Secondary | ICD-10-CM | POA: Diagnosis not present

## 2023-02-22 DIAGNOSIS — K5900 Constipation, unspecified: Secondary | ICD-10-CM | POA: Diagnosis present

## 2023-02-22 DIAGNOSIS — K8020 Calculus of gallbladder without cholecystitis without obstruction: Secondary | ICD-10-CM | POA: Diagnosis not present

## 2023-02-22 DIAGNOSIS — K838 Other specified diseases of biliary tract: Secondary | ICD-10-CM | POA: Diagnosis not present

## 2023-02-22 DIAGNOSIS — K8689 Other specified diseases of pancreas: Secondary | ICD-10-CM | POA: Diagnosis not present

## 2023-02-22 DIAGNOSIS — Z9841 Cataract extraction status, right eye: Secondary | ICD-10-CM

## 2023-02-22 DIAGNOSIS — R109 Unspecified abdominal pain: Secondary | ICD-10-CM | POA: Diagnosis not present

## 2023-02-22 DIAGNOSIS — Z87891 Personal history of nicotine dependence: Secondary | ICD-10-CM

## 2023-02-22 DIAGNOSIS — Z66 Do not resuscitate: Secondary | ICD-10-CM | POA: Diagnosis present

## 2023-02-22 DIAGNOSIS — I700 Atherosclerosis of aorta: Secondary | ICD-10-CM | POA: Diagnosis present

## 2023-02-22 DIAGNOSIS — R918 Other nonspecific abnormal finding of lung field: Secondary | ICD-10-CM | POA: Diagnosis not present

## 2023-02-22 DIAGNOSIS — N390 Urinary tract infection, site not specified: Secondary | ICD-10-CM | POA: Diagnosis present

## 2023-02-22 DIAGNOSIS — E869 Volume depletion, unspecified: Secondary | ICD-10-CM | POA: Diagnosis present

## 2023-02-22 DIAGNOSIS — Z888 Allergy status to other drugs, medicaments and biological substances status: Secondary | ICD-10-CM

## 2023-02-22 LAB — COMPREHENSIVE METABOLIC PANEL
ALT: 10 U/L (ref 0–44)
AST: 18 U/L (ref 15–41)
Albumin: 2.2 g/dL — ABNORMAL LOW (ref 3.5–5.0)
Alkaline Phosphatase: 100 U/L (ref 38–126)
Anion gap: 10 (ref 5–15)
BUN: 36 mg/dL — ABNORMAL HIGH (ref 8–23)
CO2: 19 mmol/L — ABNORMAL LOW (ref 22–32)
Calcium: 7.6 mg/dL — ABNORMAL LOW (ref 8.9–10.3)
Chloride: 101 mmol/L (ref 98–111)
Creatinine, Ser: 2.66 mg/dL — ABNORMAL HIGH (ref 0.44–1.00)
GFR, Estimated: 17 mL/min — ABNORMAL LOW (ref >60)
Glucose, Bld: 118 mg/dL — ABNORMAL HIGH (ref 70–99)
Potassium: 3.7 mmol/L (ref 3.5–5.1)
Sodium: 130 mmol/L — ABNORMAL LOW (ref 135–145)
Total Bilirubin: 0.8 mg/dL (ref 0.0–1.2)
Total Protein: 5.2 g/dL — ABNORMAL LOW (ref 6.5–8.1)

## 2023-02-22 LAB — URINALYSIS, ROUTINE W REFLEX MICROSCOPIC
Bilirubin Urine: NEGATIVE
Glucose, UA: NEGATIVE mg/dL
Ketones, ur: NEGATIVE mg/dL
Nitrite: NEGATIVE
Protein, ur: 100 mg/dL — AB
Specific Gravity, Urine: 1.011 (ref 1.005–1.030)
WBC, UA: >50 WBC/hpf (ref 0–5)
pH: 5 (ref 5.0–8.0)

## 2023-02-22 LAB — CBC
HCT: 29.1 % — ABNORMAL LOW (ref 36.0–46.0)
Hemoglobin: 9.2 g/dL — ABNORMAL LOW (ref 12.0–15.0)
MCH: 26 pg (ref 26.0–34.0)
MCHC: 31.6 g/dL (ref 30.0–36.0)
MCV: 82.2 fL (ref 80.0–100.0)
Platelets: 230 10*3/uL (ref 150–400)
RBC: 3.54 MIL/uL — ABNORMAL LOW (ref 3.87–5.11)
RDW: 14.9 % (ref 11.5–15.5)
WBC: 33.1 10*3/uL — ABNORMAL HIGH (ref 4.0–10.5)
nRBC: 0 % (ref 0.0–0.2)

## 2023-02-22 LAB — CBC WITH DIFFERENTIAL/PLATELET
Abs Immature Granulocytes: 1.14 10*3/uL — ABNORMAL HIGH (ref 0.00–0.07)
Basophils Absolute: 0.1 10*3/uL (ref 0.0–0.1)
Basophils Relative: 0 %
Eosinophils Absolute: 0 10*3/uL (ref 0.0–0.5)
Eosinophils Relative: 0 %
HCT: 28.9 % — ABNORMAL LOW (ref 36.0–46.0)
Hemoglobin: 9.5 g/dL — ABNORMAL LOW (ref 12.0–15.0)
Immature Granulocytes: 4 %
Lymphocytes Relative: 4 %
Lymphs Abs: 1.3 10*3/uL (ref 0.7–4.0)
MCH: 26.9 pg (ref 26.0–34.0)
MCHC: 32.9 g/dL (ref 30.0–36.0)
MCV: 81.9 fL (ref 80.0–100.0)
Monocytes Absolute: 1.6 10*3/uL — ABNORMAL HIGH (ref 0.1–1.0)
Monocytes Relative: 5 %
Neutro Abs: 28.3 10*3/uL — ABNORMAL HIGH (ref 1.7–7.7)
Neutrophils Relative %: 87 %
Platelets: 196 10*3/uL (ref 150–400)
RBC: 3.53 MIL/uL — ABNORMAL LOW (ref 3.87–5.11)
RDW: 15.4 % (ref 11.5–15.5)
WBC: 32.4 10*3/uL — ABNORMAL HIGH (ref 4.0–10.5)
nRBC: 0 % (ref 0.0–0.2)

## 2023-02-22 LAB — CREATININE, SERUM
Creatinine, Ser: 2.7 mg/dL — ABNORMAL HIGH (ref 0.44–1.00)
GFR, Estimated: 17 mL/min — ABNORMAL LOW (ref >60)

## 2023-02-22 LAB — I-STAT CG4 LACTIC ACID, ED: Lactic Acid, Venous: 1.9 mmol/L (ref 0.5–1.9)

## 2023-02-22 LAB — LIPASE, BLOOD: Lipase: 20 U/L (ref 11–51)

## 2023-02-22 MED ORDER — SODIUM CHLORIDE 0.9% FLUSH: 3.0000 mL | INTRAVENOUS | Status: DC | PRN

## 2023-02-22 MED ORDER — MIRABEGRON ER 25 MG PO TB24
25.0000 mg | ORAL_TABLET | Freq: Every day | ORAL | Status: DC
  Administered 2023-02-23 – 2023-03-01 (×7): 25 mg via ORAL
  Filled 2023-02-22 (×7): qty 1

## 2023-02-22 MED ORDER — SODIUM CHLORIDE 0.9 % IV SOLN
2.0000 g | Freq: Once | INTRAVENOUS | Status: AC
  Administered 2023-02-22: 2 g via INTRAVENOUS
  Filled 2023-02-22: qty 20

## 2023-02-22 MED ORDER — PANTOPRAZOLE SODIUM 40 MG PO TBEC
40.0000 mg | DELAYED_RELEASE_TABLET | Freq: Every day | ORAL | Status: DC
  Administered 2023-02-23 – 2023-03-01 (×7): 40 mg via ORAL
  Filled 2023-02-22 (×7): qty 1

## 2023-02-22 MED ORDER — HEPARIN SODIUM (PORCINE) 5000 UNIT/ML IJ SOLN
5000.0000 [IU] | Freq: Three times a day (TID) | INTRAMUSCULAR | Status: DC
  Administered 2023-02-22 – 2023-02-28 (×17): 5000 [IU] via SUBCUTANEOUS
  Filled 2023-02-22 (×18): qty 1

## 2023-02-22 MED ORDER — POLYVINYL ALCOHOL 1.4 % OP SOLN: 1.0000 [drp] | Freq: Four times a day (QID) | OPHTHALMIC | Status: DC | PRN

## 2023-02-22 MED ORDER — SODIUM CHLORIDE 0.9 % IV BOLUS: 1000.0000 mL | Freq: Once | INTRAVENOUS | Status: DC

## 2023-02-22 MED ORDER — SODIUM CHLORIDE 0.9 % IV SOLN: 250.0000 mL | INTRAVENOUS | Status: AC | PRN

## 2023-02-22 MED ORDER — GABAPENTIN 100 MG PO CAPS
200.0000 mg | ORAL_CAPSULE | Freq: Every day | ORAL | Status: DC
  Administered 2023-02-22 – 2023-02-28 (×7): 200 mg via ORAL
  Filled 2023-02-22 (×7): qty 2

## 2023-02-22 MED ORDER — SODIUM CHLORIDE 0.9 % IV SOLN: 1.0000 g | INTRAVENOUS | Status: DC

## 2023-02-22 MED ORDER — LORAZEPAM 1 MG PO TABS
1.0000 mg | ORAL_TABLET | Freq: Every day | ORAL | Status: DC | PRN
  Administered 2023-02-22 – 2023-02-25 (×4): 1 mg via ORAL
  Filled 2023-02-22 (×4): qty 1

## 2023-02-22 MED ORDER — POLYETHYLENE GLYCOL 3350 17 G PO PACK: 17.0000 g | PACK | Freq: Every day | ORAL | Status: DC | PRN

## 2023-02-22 MED ORDER — SODIUM CHLORIDE 0.9% FLUSH
3.0000 mL | Freq: Two times a day (BID) | INTRAVENOUS | Status: DC
  Administered 2023-02-22 – 2023-03-01 (×14): 3 mL via INTRAVENOUS

## 2023-02-22 MED ORDER — HYDRALAZINE HCL 20 MG/ML IJ SOLN: 10.0000 mg | Freq: Four times a day (QID) | INTRAMUSCULAR | Status: DC | PRN

## 2023-02-22 MED ORDER — ACETAMINOPHEN 650 MG RE SUPP: 650.0000 mg | Freq: Four times a day (QID) | RECTAL | Status: DC | PRN

## 2023-02-22 MED ORDER — ZINC OXIDE 40 % EX OINT: 1.0000 | TOPICAL_OINTMENT | CUTANEOUS | Status: DC | PRN

## 2023-02-22 MED ORDER — TRIAMCINOLONE ACETONIDE 0.1 % EX CREA: 1.0000 | TOPICAL_CREAM | Freq: Two times a day (BID) | CUTANEOUS | Status: DC | PRN

## 2023-02-22 MED ORDER — NEBIVOLOL HCL 10 MG PO TABS
5.0000 mg | ORAL_TABLET | Freq: Every day | ORAL | Status: DC
  Administered 2023-02-23 – 2023-02-25 (×3): 5 mg via ORAL
  Filled 2023-02-22 (×3): qty 1

## 2023-02-22 MED ORDER — SODIUM CHLORIDE 0.9 % IV SOLN: INTRAVENOUS | Status: AC

## 2023-02-22 MED ORDER — ONDANSETRON HCL 4 MG PO TABS: 4.0000 mg | ORAL_TABLET | Freq: Four times a day (QID) | ORAL | Status: DC | PRN

## 2023-02-22 MED ORDER — SODIUM CHLORIDE 0.9 % IV BOLUS
1000.0000 mL | Freq: Once | INTRAVENOUS | Status: AC
  Administered 2023-02-22: 1000 mL via INTRAVENOUS

## 2023-02-22 MED ORDER — ACETAMINOPHEN 325 MG PO TABS: 650.0000 mg | ORAL_TABLET | Freq: Four times a day (QID) | ORAL | Status: DC | PRN

## 2023-02-22 MED ORDER — ONDANSETRON HCL 4 MG/2ML IJ SOLN: 4.0000 mg | Freq: Four times a day (QID) | INTRAMUSCULAR | Status: DC | PRN

## 2023-02-22 NOTE — Progress Notes (Signed)
 Careteam: Patient Care Team: Caro Harlene POUR, NP as PCP - General (Geriatric Medicine) Roz Anes, MD as Consulting Physician (Ophthalmology) Rox Charleston, MD as Consulting Physician (Obstetrics and Gynecology) Livingston Rigg, MD (Inactive) as Consulting Physician (Dermatology)  PLACE OF SERVICE:  [facility]  Advanced Directive information Does Patient Have a Medical Advance Directive?: Yes, Type of Advance Directive: Healthcare Power of [place];Living will, Does patient want to make changes to medical advance directive?: No - Patient declined  Allergies  Allergen Reactions   Cymbalta  [Duloxetine  Hcl] Diarrhea    Chief Complaint  Patient presents with   Acute Visit    Low O2 and weakness.      HPI: Patient is a 88 y.o. female for acute visit She called 911  due to feeling poorly and O2 on her home meter was 88% When the EMS showed up they said her VS looked good and recommended to evalute in office She is here now. Results of her visit from Friday came in showing elevated wbc with AKI She is sleeping 14 hours a day and very weak.   Review of Systems:  Review of Systems  Constitutional:  Positive for chills and malaise/fatigue. Negative for fever and weight loss.  HENT:  Negative for tinnitus.   Respiratory:  Negative for cough, sputum production and shortness of breath.   Cardiovascular:  Negative for chest pain, palpitations and leg swelling.  Gastrointestinal:  Negative for abdominal pain, constipation, diarrhea and heartburn.  Genitourinary:  Negative for dysuria, frequency and urgency.  Musculoskeletal:  Negative for back pain, falls, joint pain and myalgias.  Skin: Negative.   Neurological:  Negative for dizziness and headaches.  Psychiatric/Behavioral:  Negative for depression and memory loss. The patient does not have insomnia.     Past Medical History:  Diagnosis Date   Age-related macular degeneration, dry, both eyes    Alcoholism (HCC)    Anemia     Anxiety    Cholelithiasis    Endometriosis    Goiter    Herpes simplex    Hyperlipidemia    Hypertension    Loss of weight    Nontoxic multinodular goiter    Other abnormal blood chemistry    PUD (peptic ulcer disease)    Renal cyst    Sciatica    Scoliosis (and kyphoscoliosis), idiopathic    Unspecified disorder of kidney and ureter    Urinary frequency    Past Surgical History:  Procedure Laterality Date   ABDOMINAL HYSTERECTOMY  1971   CATARACT EXTRACTION W/ INTRAOCULAR LENS  IMPLANT, BILATERAL Bilateral 2016   Dr. Anes Roz   IRRIGATION AND DEBRIDEMENT ABSCESS Left 02/23/2017   Procedure: IRRIGATION AND DEBRIDEMENT ABSCESS;  Surgeon: Vernetta Lonni GRADE, MD;  Location: [facility] OR;  Service: Orthopedics;  Laterality: Left;   REPAIR OF PERFORATED ULCER  1973 &1975   Dr Jannetta   STOMACH SURGERY  [phone]   removed 1/2 stomach; had a total of 5 ORs from 1973-1980; all related to perforated ulcer   TONSILLECTOMY     Social History:   reports that she quit smoking about 29 years ago. Her smoking use included cigarettes. She started smoking about 69 years ago. She has a 40 pack-year smoking history. She has never used smokeless tobacco. She reports that she does not drink alcohol  and does not use drugs.  Family History  Problem Relation Age of Onset   Obesity Mother    Neuropathy Neg Hx    Colon cancer Neg Hx  Esophageal cancer Neg Hx    Pancreatic cancer Neg Hx    Stomach cancer Neg Hx     Medications: Patient's Medications  New Prescriptions   No medications on file  Previous Medications   AMLODIPINE -VALSARTAN  (EXFORGE ) 10-320 MG TABLET    TAKE 1 TABLET BY MOUTH EVERY DAY   CRANBERRY PO    Take by mouth.   GABAPENTIN  (NEURONTIN ) 300 MG CAPSULE    TAKE 1 CAPSULE(300 MG) BY MOUTH AT BEDTIME   GEMTESA  75 MG TABS    Take 1 tablet (75 mg total) by mouth daily.   LIVER OIL-ZINC  OXIDE (DESITIN) 40 % OINTMENT    Apply 1 Application topically daily.   LORAZEPAM   (ATIVAN ) 0.5 MG TABLET    TAKE 1 TABLET(0.5 MG) BY MOUTH TWICE DAILY- needs follow up prior to additional refills   MULTIPLE VITAMINS-MINERALS (PRESERVISION AREDS 2 PO)    Take by mouth daily.   NEBIVOLOL  (BYSTOLIC ) 10 MG TABLET    TAKE 1/2 TABLET(5 MG) BY MOUTH DAILY   OMEPRAZOLE  (PRILOSEC) 20 MG CAPSULE    TAKE 1 CAPSULE(20 MG) BY MOUTH TWICE DAILY BEFORE A MEAL   PSYLLIUM (METAMUCIL SMOOTH TEXTURE) 58.6 % POWDER    Take 1 packet by mouth daily.   TRIAMCINOLONE  CREAM (KENALOG ) 0.1 %    Apply 1 Application topically 2 (two) times daily.  Modified Medications   No medications on file  Discontinued Medications   No medications on file    Physical Exam:  Vitals:   02/22/23 1046  BP: 114/70  Pulse: 73  Resp: 18  Temp: 97.7 F (36.5 C)  SpO2: 91%  Weight: 103 lb 12.8 oz (47.1 kg)  Height: 5' 4 (1.626 m)   Body mass index is 17.82 kg/m. Wt Readings from Last 3 Encounters:  02/22/23 103 lb 12.8 oz (47.1 kg)  02/19/23 104 lb 9.6 oz (47.4 kg)  02/15/23 103 lb 9.6 oz (47 kg)    Physical Exam Constitutional:      General: She is not in acute distress.    Appearance: She is well-developed. She is not diaphoretic.  HENT:     Head: Normocephalic and atraumatic.     Mouth/Throat:     Pharynx: No oropharyngeal exudate.  Eyes:     Conjunctiva/sclera: Conjunctivae normal.     Pupils: Pupils are equal, round, and reactive to light.  Cardiovascular:     Rate and Rhythm: Normal rate and regular rhythm.     Heart sounds: Normal heart sounds.  Pulmonary:     Effort: Pulmonary effort is normal.     Breath sounds: Normal breath sounds.  Abdominal:     General: Bowel sounds are normal.     Palpations: Abdomen is soft.     Tenderness: There is right CVA tenderness and left CVA tenderness.  Musculoskeletal:     Cervical back: Normal range of motion and neck supple.     Right lower leg: No edema.     Left lower leg: No edema.  Skin:    General: Skin is warm and dry.  Neurological:      Mental Status: She is alert.  Psychiatric:        Mood and Affect: Mood normal.     Labs reviewed: Basic Metabolic Panel: Recent Labs    08/03/22 1352 02/19/23 1354  NA 134* 132*  K 5.0 4.1  CL 100 97*  CO2 22 21  GLUCOSE 163* 117  BUN 24 35*  CREATININE 1.52* 2.49*  CALCIUM 9.5  8.9  TSH  --  0.40   Liver Function Tests: Recent Labs    08/03/22 1352 02/19/23 1354  AST 31 20  ALT 17 13  BILITOT 0.3 0.5  PROT 6.7 5.9*   No results for input(s): LIPASE, AMYLASE in the last 8760 hours. No results for input(s): AMMONIA in the last 8760 hours. CBC: Recent Labs    08/03/22 1352 02/19/23 1354  WBC 8.4 22.1*  NEUTROABS 5,326 14,630*  HGB 11.4* 10.0*  HCT 35.3 31.6*  MCV 89.1 82.5  PLT 294 213   Lipid Panel: Recent Labs    08/03/22 1453  CHOL 208*  HDL 140  LDLCALC 49  TRIG 891  CHOLHDL 1.5   TSH: Recent Labs    02/19/23 1354  TSH 0.40   A1C: Lab Results  Component Value Date   HGBA1C 6.0 (H) 08/03/2022     Assessment/Plan 1. AKI (acute kidney injury) (HCC) (Primary) Noted on labs from 4 days ago, sleeping more and feeling poorly, recommended ED at this time, she is in agreement.   2. Acute cystitis without hematuria With elevatation of wbc and AKI, advised to go to the ED immediately, she is in agreement with plan   Helix Shellhammer K. Caro BODILY [facility] [phone]

## 2023-02-22 NOTE — Patient Instructions (Signed)
 You have acute kidney injury with elevated white blood cell counts in your blood work You do have a UTI Recommended to go to the Emergency department immediately

## 2023-02-22 NOTE — ED Notes (Signed)
 Second set of cultures attempted to be drawn and was unsuccessful. Pt stated she "only has one good one over her in the R arm, and if they don't get that one, they've been known to my foot".

## 2023-02-22 NOTE — Hospital Course (Addendum)
 88 year old woman PMH alcoholism, cholelithiasis, hyperlipidemia, hypertension with complaints of malaise fatigue and decreased oral intake for the past 5 to 6 days.  Patient was sent to the [facility] emergency department identified by his primary care provider he was found to have developed an elevated creatinine of 2.4.    In the ED noted to have mild hyponatremia, AKI, suspected UTI, marked leukocytosis.  CT imaging of the abdomen pelvis revealed liver and gallbladder abnormalities as well as lymphadenopathy concerning for underlying malignancy.  And imaging abnormalities of the liver and gallbladder with lymphadenopathy concerning for malignancy.  For patient suspected urinary tract infection, patient was initiated on intravenous ceftriaxone .  Both blood and urine cultures came back positive for E. coli.  For management of both urinary tract infection and acute kidney injury patient was placed on intravenous fluids.  Dr. Geralynn with nephrology was consulted.  Renal injury did improve over the first several days of hospitalization with intravenous fluids and antibiotics.  Concerning the patient's findings of likely malignancy on CT imaging after lengthy discussions with the patient about goals of care she wished to not proceed with further workup or any potential treatment.  She wished to instead transition to comfort measures with plan for patient to likely be discharged home with home-going hospice on 1/14.

## 2023-02-22 NOTE — ED Provider Triage Note (Addendum)
 Emergency Medicine Provider Triage Evaluation Note  [name] , a 88 y.o. female  was evaluated in triage.  Pt complains of unwell.  Review of Systems  Positive: Fatigue/weak Negative: Vomiting, fever, dysuria  Physical Exam  BP 107/85 (BP Location: Right Arm)   Pulse 74   Temp 97.7 F (36.5 C) (Oral)   Resp 15   Ht 5' 4 (1.626 m)   Wt 46.7 kg   SpO2 90%   BMI 17.68 kg/m  Gen:   Awake, no distress  Looks tired Resp:  Normal effort  MSK:   Moves extremities without difficulty  Other:    Medical Decision Making  Medically screening exam initiated at 12:19 PM.  Appropriate orders placed.  [name] was informed that the remainder of the evaluation will be completed by another provider, this initial triage assessment does not replace that evaluation, and the importance of remaining in the ED until their evaluation is complete.  Feeling unwell x 1 week. Seen Friday by PCP, labs shared today show AKI, UTI  **Urine culture 1/3 shows E Coli   Odell Balls, PA-C 02/22/23 1221    Odell Balls, PA-C 02/22/23 1223

## 2023-02-22 NOTE — H&P (Signed)
 [facility] History and Physical  [name] FMW:[id number] DOB: [birth year] DOA: 02/22/2023  Referring physician: ED  PCP: Caro Harlene POUR, NP   Patient is coming from: Home  Chief Complaint: Abnormal renal function  HPI:  88 years old female with history of alcoholism, cholelithiasis, hyperlipidemia, hypertension presented to hospital with malaise fatigue and decreased oral intake for last 5 to 6 days.  Patient was seen by PCP on Friday and labs and urine test were obtained and was noted to have AKI so was asked to come to the ED for further evaluation.  BMP done 02/19/2023 showed creatinine elevated at 2.4 from baseline 1.5.  In the ED, patient was afebrile.  Patient complains of generalized weakness fatigue and tiredness.  Has been having frequency of urination and difficulty walking recently.  Normally she is able to walk okay at home and does not use walker or cane but recently has not been able to do so much.  She also complained of chills and shaking at home.  She has been eating okay and has not had a bowel movement in couple of days.   In the ED, labs were notable for significant leukocytosis with WBC at 32.4 with hemoglobin of 9.5.  Lactate was within normal limits.  BMP with creatinine of 2.6, sodium of 130 urinalysis showed large leukocytes with cloudy appearing urine and many bacteria.  EKG showed normal sinus rhythm.  Blood cultures and urine cultures were sent from the ED and patient received Rocephin  and normal saline bolus. Patient was considered for admission to the hospital for further evaluation and treatment.    Assessment plan. Principal Problem:   AKI (acute kidney injury) (HCC) Active Problems:   Essential hypertension, benign   DM2 (diabetes mellitus, type 2) (HCC)   UTI (urinary tract infection)   Volume depletion   Generalized weakness    Acute kidney injury likely secondary to volume depletion.  Creatinine on presentation at 2.6 from baseline around 1.5.   Will continue with IV fluids hydration.  Encourage oral.  CT scan of the abdomen without any renal stones.  Will hold off with amlodipine  and valsartan  combination for now.  Check BMP in AM.  Significant leukocytosis could be secondary to UTI.  Continue to monitor while in the hospital.  Sepsis ruled out.  UTI.  Grossly abnormal urinalysis with significant leukocytosis.  Will continue with IV Rocephin .  Follow urine cultures, blood cultures.  Volume depletion.  Likely causing AKI.  Continue with hydration.  Patient feels thirsty.  Check volume status in AM.  Mild hyponatremia likely secondary to volume depletion.  Will continue to monitor closely.  Fatigue, generalized weakness.  Will get PT OT evaluation.  Treat for UTI volume depletion. . Abnormal CT with abnormal gallbladder findings.  Some lymphadenopathy and thickening of the gallbladder noted.  Check right upper quadrant ultrasound.  Might need MRI if abnormal on ultrasound.  No elevation of LFTs.  Denies pulmonary symptoms though some basal findings on the CT scan.  History of hypertension.  Hold amlodipine  valsartan  combination,.  Resume nebivolol .  Add as needed hydralazine .   DVT Prophylaxis: Heparin  subcu  Review of Systems:  All systems were reviewed and were negative unless otherwise mentioned in the HPI   Past Medical History:  Diagnosis Date   Age-related macular degeneration, dry, both eyes    Alcoholism (HCC)    Anemia    Anxiety    Cholelithiasis    Endometriosis    Goiter  Herpes simplex    Hyperlipidemia    Hypertension    Loss of weight    Nontoxic multinodular goiter    Other abnormal blood chemistry    PUD (peptic ulcer disease)    Renal cyst    Sciatica    Scoliosis (and kyphoscoliosis), idiopathic    Unspecified disorder of kidney and ureter    Urinary frequency    Past Surgical History:  Procedure Laterality Date   ABDOMINAL HYSTERECTOMY  1971   CATARACT EXTRACTION W/ INTRAOCULAR LENS   IMPLANT, BILATERAL Bilateral 2016   Dr. Oneil Platts   IRRIGATION AND DEBRIDEMENT ABSCESS Left 02/23/2017   Procedure: IRRIGATION AND DEBRIDEMENT ABSCESS;  Surgeon: Vernetta Lonni GRADE, MD;  Location: [facility] OR;  Service: Orthopedics;  Laterality: Left;   REPAIR OF PERFORATED ULCER  1973 &1975   Dr Jannetta   STOMACH SURGERY  [phone]   removed 1/2 stomach; had a total of 5 ORs from 1973-1980; all related to perforated ulcer   TONSILLECTOMY      Social History:  reports that she quit smoking about 29 years ago. Her smoking use included cigarettes. She started smoking about 69 years ago. She has a 40 pack-year smoking history. She has never used smokeless tobacco. She reports that she does not drink alcohol  and does not use drugs.  Allergies  Allergen Reactions   Cymbalta  [Duloxetine  Hcl] Diarrhea    Family History  Problem Relation Age of Onset   Obesity Mother    Neuropathy Neg Hx    Colon cancer Neg Hx    Esophageal cancer Neg Hx    Pancreatic cancer Neg Hx    Stomach cancer Neg Hx      Prior to Admission medications   Medication Sig Start Date End Date Taking? Authorizing Provider  amLODipine -valsartan  (EXFORGE ) 10-320 MG tablet TAKE 1 TABLET BY MOUTH EVERY DAY Patient taking differently: Take 1 tablet by mouth in the morning. 01/21/23  Yes Caro Harlene POUR, NP  ARTIFICIAL TEARS PF 0.1-0.3 % SOLN Place 1 drop into both eyes 4 (four) times daily as needed (for dryness or irritation).   Yes [provider]  conjugated estrogens (PREMARIN) vaginal cream Place 1 Application vaginally See admin instructions. Place a pea-sized amount vaginally once a day   Yes [provider]  CRANBERRY PO Take 2 capsules by mouth daily.   Yes [provider]  gabapentin  (NEURONTIN ) 300 MG capsule TAKE 1 CAPSULE(300 MG) BY MOUTH AT BEDTIME Patient taking differently: Take 300 mg by mouth at bedtime. 09/07/22  Yes Caro Harlene POUR, NP  GEMTESA  75 MG TABS Take 1 tablet (75  mg total) by mouth daily. 08/31/22  Yes Caro Harlene POUR, NP  liver oil-zinc  oxide (DESITIN) 40 % ointment Apply 1 Application topically as needed for irritation. 11/17/22  Yes [provider]  LORazepam  (ATIVAN ) 0.5 MG tablet TAKE 1 TABLET(0.5 MG) BY MOUTH TWICE DAILY- needs follow up prior to additional refills Patient taking differently: Take 0.5 mg by mouth in the morning and at bedtime. 02/01/23  Yes Caro Harlene POUR, NP  Multiple Vitamins-Minerals (PRESERVISION AREDS) CAPS Take 1 capsule by mouth 2 (two) times daily.   Yes [provider]  nebivolol  (BYSTOLIC ) 10 MG tablet TAKE 1/2 TABLET(5 MG) BY MOUTH DAILY Patient taking differently: Take 5 mg by mouth in the morning. 02/15/23  Yes Caro Harlene POUR, NP  omeprazole  (PRILOSEC) 20 MG capsule TAKE 1 CAPSULE(20 MG) BY MOUTH TWICE DAILY BEFORE A MEAL Patient taking differently: Take 20 mg  by mouth 2 (two) times daily before a meal. 09/14/22  Yes Eubanks, Jessica K, NP  triamcinolone  cream (KENALOG ) 0.1 % Apply 1 Application topically 2 (two) times daily as needed (for itching).   Yes [provider]  psyllium (METAMUCIL SMOOTH TEXTURE) 58.6 % powder Take 1 packet by mouth daily. Patient not taking: Reported on 02/22/2023 11/23/22   Phyllis Jereld BROCKS, NP    Physical Exam: Vitals:   02/22/23 1135 02/22/23 1346 02/22/23 1548 02/22/23 1630  BP:  105/62  115/77  Pulse:  81  92  Resp:  15  17  Temp:   98.3 F (36.8 C)   TempSrc:   Oral   SpO2:  90%  95%  Weight: 46.7 kg     Height: 5' 4 (1.626 m)      Wt Readings from Last 3 Encounters:  02/22/23 46.7 kg  02/22/23 47.1 kg  02/19/23 47.4 kg   Body mass index is 17.68 kg/m.  General: Thinly built, not in obvious distress elderly female, appears chronically ill,  HENT: Normocephalic, No scleral pallor or icterus noted. Oral mucosa is dry Chest:  Clear breath sounds.  . No crackles or wheezes.  CVS: S1 &S2 heard. No murmur.  Regular rate and  rhythm. Abdomen: Soft, nontender, nondistended.  Bowel sounds are heard. No abdominal mass palpated Extremities: No cyanosis, clubbing or edema.  Peripheral pulses are palpable. Psych: Alert, awake and oriented, normal mood CNS:  No cranial nerve deficits.  Generalized weakness noted. Skin: Warm and dry.  No rashes noted.  Labs on Admission:   CBC: Recent Labs  Lab 02/19/23 1354 02/22/23 1335  WBC 22.1* 32.4*  NEUTROABS 14,630* 28.3*  HGB 10.0* 9.5*  HCT 31.6* 28.9*  MCV 82.5 81.9  PLT 213 196    Basic Metabolic Panel: Recent Labs  Lab 02/19/23 1354 02/22/23 1435  NA 132* 130*  K 4.1 3.7  CL 97* 101  CO2 21 19*  GLUCOSE 117 118*  BUN 35* 36*  CREATININE 2.49* 2.66*  CALCIUM 8.9 7.6*    Liver Function Tests: Recent Labs  Lab 02/19/23 1354 02/22/23 1435  AST 20 18  ALT 13 10  ALKPHOS  --  100  BILITOT 0.5 0.8  PROT 5.9* 5.2*  ALBUMIN  --  2.2*   No results for input(s): LIPASE, AMYLASE in the last 168 hours. No results for input(s): AMMONIA in the last 168 hours.  Cardiac Enzymes: No results for input(s): CKTOTAL, CKMB, CKMBINDEX, TROPONINI in the last 168 hours.  BNP (last 3 results) No results for input(s): BNP in the last 8760 hours.  ProBNP (last 3 results) No results for input(s): PROBNP in the last 8760 hours.  CBG: No results for input(s): GLUCAP in the last 168 hours.  Lipase  No results found for: LIPASE   Urinalysis    Component Value Date/Time   COLORURINE YELLOW 02/22/2023 1312   APPEARANCEUR CLOUDY (A) 02/22/2023 1312   LABSPEC 1.011 02/22/2023 1312   PHURINE 5.0 02/22/2023 1312   GLUCOSEU NEGATIVE 02/22/2023 1312   HGBUR SMALL (A) 02/22/2023 1312   BILIRUBINUR NEGATIVE 02/22/2023 1312   BILIRUBINUR negative 02/19/2023 1358   BILIRUBINUR neg 05/14/2014 1601   KETONESUR NEGATIVE 02/22/2023 1312   PROTEINUR 100 (A) 02/22/2023 1312   UROBILINOGEN 0.2 02/19/2023 1358   NITRITE NEGATIVE 02/22/2023 1312    LEUKOCYTESUR LARGE (A) 02/22/2023 1312     Drugs of Abuse  No results found for: LABOPIA, COCAINSCRNUR, LABBENZ, AMPHETMU, THCU, LABBARB  Radiological Exams on Admission: CT Renal Stone Study Result Date: 02/22/2023 CLINICAL DATA:  Urinary tract infection and acute kidney injury. * Tracking Code: BO * EXAM: CT ABDOMEN AND PELVIS WITHOUT CONTRAST TECHNIQUE: Multidetector CT imaging of the abdomen and pelvis was performed following the standard protocol without IV contrast. RADIATION DOSE REDUCTION: This exam was performed according to the departmental dose-optimization program which includes automated exposure control, adjustment of the mA and/or kV according to patient size and/or use of iterative reconstruction technique. COMPARISON:  CT abdomen and pelvis dated 07/17/2021 FINDINGS: Lower chest: Bilateral lower lobe irregular atelectasis/consolidation, left-greater-than-right. Trace left pleural effusion. Partially imaged heart size is normal. Hepatobiliary: Subtle hypoattenuating focus measuring 1.0 cm in segment 7/8 (2:8), new from 07/17/2021. Unchanged segment 4 cyst. No intra or extrahepatic biliary ductal dilation. Mildly distended gallbladder with partially calcified irregular mural thickening along the medial wall (2:33). Pancreas: Similar pancreatic parenchymal atrophy and dilation of the pancreatic duct in the tail up to 5 mm. Spleen: Normal in size without focal abnormality. Adrenals/Urinary Tract: 1.3 cm left adrenal nodule measures 11 HU, however appears new (2:17). No definite right adrenal nodule. No suspicious renal mass on this noncontrast enhanced examination , calculi or hydronephrosis. No focal bladder wall thickening. Stomach/Bowel: Multiple surgical clips at the gastroesophageal junction. Normal appearance of the stomach. Postsurgical changes of small bowel in the left upper quadrant. Patent anastomosis. No evidence of bowel wall thickening, distention, or inflammatory  changes. Appendix is not discretely seen. Vascular/Lymphatic: Aortic atherosclerosis. Multi station new periportal and retroaortic lymphadenopathy, for example 2.0 cm portacaval (2:25) and 1.2 cm left para-aortic (2:29). Reproductive: No adnexal masses. Other: Trace presacral edema.  No free air. Musculoskeletal: No acute or abnormal lytic or blastic osseous lesions. IMPRESSION: 1. Bilateral lower lobe irregular atelectasis/consolidation, left-greater-than-right, suspicious for aspiration or pneumonia. 2. Mildly distended gallbladder with partially calcified irregular mural thickening along the medial wall, suspicious for gallbladder malignancy, either primary or metastatic. Recommend further evaluation with contrast-enhanced MRI abdomen. Alternatively, right upper quadrant ultrasound examination can be considered. 3. Subtle hypoattenuating focus measuring 1.0 cm in hepatic segment 7/8, new from 07/17/2021, indeterminate, however suspicious for metastatic disease. This finding can be evaluated at the same time on MRI abdomen. 4. Multi station new periportal and retroaortic lymphadenopathy, suspicious for metastatic disease. 5. New 1.3 cm left adrenal nodule, indeterminate. This finding can be evaluated at the same time on MRI abdomen. 6.  Aortic Atherosclerosis (ICD10-I70.0). Electronically Signed   By: Limin  Xu M.D.   On: 02/22/2023 17:19   DG Chest Port 1 View Result Date: 02/22/2023 CLINICAL DATA:  Shortness of breath. EXAM: PORTABLE CHEST 1 VIEW COMPARISON:  None Available. FINDINGS: The linear area of scarring/atelectasis in the left retrocardiac region. Bilateral lungs appear hyperexpanded and hyperlucent with coarse bronchovascular markings, concerning for underlying COPD. Bilateral lungs otherwise appear clear. No dense consolidation or lung collapse. Bilateral costophrenic angles are clear. Normal cardio-mediastinal silhouette. No acute osseous abnormalities. The soft tissues are within normal limits.  IMPRESSION: *Left retrocardiac scarring/atelectasis.  Probable COPD. Electronically Signed   By: Ree Molt M.D.   On: 02/22/2023 14:46    EKG: Personally reviewed by me which shows normal sinus rhythm   Consultant: None  Code Status: DNR  Microbiology urine culture  Antibiotics: Rocephin  IV  Family Communication:  Patients' condition and plan of care including tests being ordered have been discussed with the patient and the patient's friend at bedside who indicate understanding and agree with the plan.   Status  is: Inpatient  Severity of Illness: The appropriate patient status for this patient is INPATIENT. Inpatient status is judged to be reasonable and necessary in order to provide the required intensity of service to ensure the patient's safety. The patient's presenting symptoms, physical exam findings, and initial radiographic and laboratory data in the context of their chronic comorbidities is felt to place them at high risk for further clinical deterioration. Furthermore, it is not anticipated that the patient will be medically stable for discharge from the hospital within 2 midnights of admission. I certify that at the point of admission it is my clinical judgment that the patient will require inpatient hospital care spanning beyond 2 midnights from the point of admission due to high intensity of service, high risk for further deterioration and high frequency of surveillance required.*  Signed, Vernal Alstrom, MD [facility] 02/22/2023

## 2023-02-22 NOTE — ED Triage Notes (Signed)
 Pt sent form PCP with concerns of AKI and UTI, symptoms for about a week.

## 2023-02-22 NOTE — ED Provider Notes (Signed)
 [place] EMERGENCY DEPARTMENT AT [facility] Provider Note   CSN: [id number] Arrival date & time: 02/22/23  1110     History  Chief Complaint  Patient presents with   Urinary Tract Infection   AKI    [name] is a 88 y.o. female.  88 year old female with prior medical history as detailed below presents for evaluation.  Patient reports 5 to 6 days of malaise, fatigue, decreased p.o. intake.  She was seen by her PCP on Friday of last week.  Labs and urine were obtained.  She reports that she felt worse this morning and return to her PCP.  She was advised that she had signs of significant infection and dehydration on workup from Friday.  She was advised to come to the ED for likely admission.  CMP completed on 1/3 demonstrated creatinine of 2.49.  Baseline creatinine appears to be 1.5.  The history is provided by the patient and medical records.       Home Medications Prior to Admission medications   Medication Sig Start Date End Date Taking? Authorizing Provider  amLODipine -valsartan  (EXFORGE ) 10-320 MG tablet TAKE 1 TABLET BY MOUTH EVERY DAY 01/21/23   Eubanks, Jessica K, NP  CRANBERRY PO Take by mouth.    [provider]  gabapentin  (NEURONTIN ) 300 MG capsule TAKE 1 CAPSULE(300 MG) BY MOUTH AT BEDTIME 09/07/22   Eubanks, Jessica K, NP  GEMTESA  75 MG TABS Take 1 tablet (75 mg total) by mouth daily. 08/31/22   Caro Harlene POUR, NP  liver oil-zinc  oxide (DESITIN) 40 % ointment Apply 1 Application topically daily. 11/17/22   [provider]  LORazepam  (ATIVAN ) 0.5 MG tablet TAKE 1 TABLET(0.5 MG) BY MOUTH TWICE DAILY- needs follow up prior to additional refills 02/01/23   Eubanks, Jessica K, NP  Multiple Vitamins-Minerals (PRESERVISION AREDS 2 PO) Take by mouth daily.    [provider]  nebivolol  (BYSTOLIC ) 10 MG tablet TAKE 1/2 TABLET(5 MG) BY MOUTH DAILY 02/15/23   Eubanks, Jessica K, NP  omeprazole  (PRILOSEC) 20 MG capsule TAKE 1 CAPSULE(20  MG) BY MOUTH TWICE DAILY BEFORE A MEAL 09/14/22   Eubanks, Jessica K, NP  psyllium (METAMUCIL SMOOTH TEXTURE) 58.6 % powder Take 1 packet by mouth daily. 11/23/22   Medina-Vargas, Monina C, NP  triamcinolone  cream (KENALOG ) 0.1 % Apply 1 Application topically 2 (two) times daily.    [provider]      Allergies    Cymbalta  [duloxetine  hcl]    Review of Systems   Review of Systems  All other systems reviewed and are negative.   Physical Exam Updated Vital Signs BP 105/62 (BP Location: Left Arm)   Pulse 81   Temp 97.7 F (36.5 C) (Oral)   Resp 15   Ht 5' 4 (1.626 m)   Wt 46.7 kg   SpO2 90%   BMI 17.68 kg/m  Physical Exam Vitals and nursing note reviewed.  Constitutional:      General: She is not in acute distress.    Appearance: Normal appearance. She is well-developed.  HENT:     Head: Normocephalic and atraumatic.     Mouth/Throat:     Mouth: Mucous membranes are dry.  Eyes:     Conjunctiva/sclera: Conjunctivae normal.     Pupils: Pupils are equal, round, and reactive to light.  Cardiovascular:     Rate and Rhythm: Normal rate and regular rhythm.     Heart sounds: Normal heart sounds.  Pulmonary:  Effort: Pulmonary effort is normal. No respiratory distress.     Breath sounds: Normal breath sounds.  Abdominal:     General: There is no distension.     Palpations: Abdomen is soft.     Tenderness: There is no abdominal tenderness.  Musculoskeletal:        General: No deformity. Normal range of motion.     Cervical back: Normal range of motion and neck supple.  Skin:    General: Skin is warm and dry.  Neurological:     General: No focal deficit present.     Mental Status: She is alert and oriented to person, place, and time.     ED Results / Procedures / Treatments   Labs (all labs ordered are listed, but only abnormal results are displayed) Labs Reviewed  CBC WITH DIFFERENTIAL/PLATELET - Abnormal; Notable for the following components:       Result Value   WBC 32.4 (*)    RBC 3.53 (*)    Hemoglobin 9.5 (*)    HCT 28.9 (*)    Neutro Abs 28.3 (*)    Monocytes Absolute 1.6 (*)    Abs Immature Granulocytes 1.14 (*)    All other components within normal limits  URINALYSIS, ROUTINE W REFLEX MICROSCOPIC - Abnormal; Notable for the following components:   APPearance CLOUDY (*)    Hgb urine dipstick SMALL (*)    Protein, ur 100 (*)    Leukocytes,Ua LARGE (*)    Bacteria, UA MANY (*)    All other components within normal limits  CULTURE, BLOOD (ROUTINE X 2)  CULTURE, BLOOD (ROUTINE X 2)  COMPREHENSIVE METABOLIC PANEL  COMPREHENSIVE METABOLIC PANEL  I-STAT CG4 LACTIC ACID, ED  I-STAT CG4 LACTIC ACID, ED    EKG EKG Interpretation Date/Time:  Monday February 22 2023 13:21:27 EST Ventricular Rate:  75 PR Interval:  170 QRS Duration:  84 QT Interval:  391 QTC Calculation: 437 R Axis:   67  Text Interpretation: Sinus rhythm Low voltage, extremity leads Abnormal T, consider ischemia, diffuse leads Confirmed by Laurice Coy [phone]) on 02/22/2023 1:26:36 PM  Radiology No results found.  Procedures Procedures    Medications Ordered in ED Medications  cefTRIAXone  (ROCEPHIN ) 2 g in sodium chloride  0.9 % 100 mL IVPB (has no administration in time range)  sodium chloride  0.9 % bolus 1,000 mL (1,000 mLs Intravenous New Bag/Given 02/22/23 1345)    ED Course/ Medical Decision Making/ A&P                                 Medical Decision Making Amount and/or Complexity of Data Reviewed Radiology: ordered.  Risk Decision regarding hospitalization.    Medical Screen Complete  This patient presented to the ED with complaint of malaise, fatigue, suspected UTI and dehydration.  This complaint involves an extensive number of treatment options. The initial differential diagnosis includes, but is not limited to, Bolick abnormality, infection, etc.  This presentation is: Acute, Self-Limited, Previously Undiagnosed, Uncertain  Prognosis, Complicated, Systemic Symptoms, and Threat to Life/Bodily Function  Patient is presenting with malaise, fatigue, reported laboratory abnormalities suggestive of AKI and UTI.  Patient is alert and conversant during initial evaluation.  Labs obtained today demonstrate elevated white count with likely related UTI.  IV fluids and antibiotics administered here in the ED.  Patient would benefit from admission for further workup and treatment.  Oncoming EDP aware of case and aware of need  for discussion with hospitalist service.  Additional history obtained: External records from outside sources obtained and reviewed including prior ED visits and prior Inpatient records.    Lab Tests:  I ordered and personally interpreted labs.  The pertinent results include: CBC, CMP, lactic acid, UA   Imaging Studies ordered:  I ordered imaging studies including cxr  I independently visualized and interpreted obtained imaging which showed NAD I agree with the radiologist interpretation.   Cardiac Monitoring:  The patient was maintained on a cardiac monitor.  I personally viewed and interpreted the cardiac monitor which showed an underlying rhythm of: NSR   Medicines ordered:  I ordered medication including rocephin   for suspected UTI  Reevaluation of the patient after these medicines showed that the patient: improved    Problem List / ED Course:  UTI, Leukocytosis, AKI   Reevaluation:  After the interventions noted above, I reevaluated the patient and found that they have: improved  Disposition:  After consideration of the diagnostic results and the patients response to treatment, I feel that the patent would benefit from admission.          Final Clinical Impression(s) / ED Diagnoses Final diagnoses:  Weakness  Urinary tract infection without hematuria, site unspecified  AKI (acute kidney injury) [facility])    Rx / DC Orders ED Discharge Orders     None          Laurice Maude BROCKS, MD 02/23/23 2350

## 2023-02-22 NOTE — ED Notes (Signed)
 Bladder scan volume: 29mL, pt informed me that she recently voided.

## 2023-02-22 NOTE — ED Provider Notes (Addendum)
 Patient needs admission for AKI and UTI.  She has had a white count of 32.  Normal lactic acid.  No fever.  Creatinine is up to 2.6.  Plan is to get CT to rule out kidney stone and admit for further care for UTI and AKI.  Blood cultures and antibiotics have been ordered but she does not meet sepsis criteria at this time.  Will admit to medicine.  CT scan did not show any kidney stones.  There could be a concern for gallbladder cancerous process.  May be some concern for metastatic disease in the liver as well.  There is also some lymphadenopathy suspicious for metastatic disease.  May be aspiration or pneumonia in the lower lungs.  Ultimately will order right upper quadrant ultrasound to maybe help evaluate this area little bit more but I think she primarily needs admission for UTI and AKI workup and sepsis rule out.  She will likely need an oncology workup at some time as well.  [facility] team to admit.  This chart was dictated using voice recognition software.  Despite best efforts to proofread,  errors can occur which can change the documentation meaning.    Ruthe Cornet, DO 02/22/23 1640    Ruthe Cornet, DO 02/22/23 1801

## 2023-02-23 DIAGNOSIS — E871 Hypo-osmolality and hyponatremia: Secondary | ICD-10-CM | POA: Insufficient documentation

## 2023-02-23 DIAGNOSIS — N3000 Acute cystitis without hematuria: Secondary | ICD-10-CM | POA: Diagnosis not present

## 2023-02-23 DIAGNOSIS — R7881 Bacteremia: Secondary | ICD-10-CM | POA: Insufficient documentation

## 2023-02-23 DIAGNOSIS — B9620 Unspecified Escherichia coli [E. coli] as the cause of diseases classified elsewhere: Secondary | ICD-10-CM | POA: Insufficient documentation

## 2023-02-23 DIAGNOSIS — N179 Acute kidney failure, unspecified: Secondary | ICD-10-CM | POA: Diagnosis not present

## 2023-02-23 LAB — BLOOD CULTURE ID PANEL (REFLEXED) - BCID2

## 2023-02-23 LAB — BASIC METABOLIC PANEL
Anion gap: 10 (ref 5–15)
BUN: 34 mg/dL — ABNORMAL HIGH (ref 8–23)
CO2: 16 mmol/L — ABNORMAL LOW (ref 22–32)
Calcium: 7.2 mg/dL — ABNORMAL LOW (ref 8.9–10.3)
Chloride: 103 mmol/L (ref 98–111)
Creatinine, Ser: 2.62 mg/dL — ABNORMAL HIGH (ref 0.44–1.00)
GFR, Estimated: 17 mL/min — ABNORMAL LOW (ref >60)
Glucose, Bld: 111 mg/dL — ABNORMAL HIGH (ref 70–99)
Potassium: 3.5 mmol/L (ref 3.5–5.1)
Sodium: 129 mmol/L — ABNORMAL LOW (ref 135–145)

## 2023-02-23 LAB — CBC
HCT: 26 % — ABNORMAL LOW (ref 36.0–46.0)
Hemoglobin: 8.5 g/dL — ABNORMAL LOW (ref 12.0–15.0)
MCH: 26.5 pg (ref 26.0–34.0)
MCHC: 32.7 g/dL (ref 30.0–36.0)
MCV: 81 fL (ref 80.0–100.0)
Platelets: 237 10*3/uL (ref 150–400)
RBC: 3.21 MIL/uL — ABNORMAL LOW (ref 3.87–5.11)
RDW: 15 % (ref 11.5–15.5)
WBC: 29.4 10*3/uL — ABNORMAL HIGH (ref 4.0–10.5)
nRBC: 0 % (ref 0.0–0.2)

## 2023-02-23 LAB — PROTIME-INR
INR: 1.2 (ref 0.8–1.2)
Prothrombin Time: 15.1 s (ref 11.4–15.2)

## 2023-02-23 LAB — MAGNESIUM: Magnesium: 2.1 mg/dL (ref 1.7–2.4)

## 2023-02-23 MED ORDER — SODIUM CHLORIDE 0.9 % IV SOLN
2.0000 g | INTRAVENOUS | Status: DC
  Administered 2023-02-23 – 2023-02-27 (×5): 2 g via INTRAVENOUS
  Filled 2023-02-23 (×5): qty 20

## 2023-02-23 MED ORDER — SODIUM CHLORIDE 0.9 % IV SOLN: INTRAVENOUS | Status: AC

## 2023-02-23 MED ORDER — MENTHOL 3 MG MT LOZG
1.0000 | LOZENGE | OROMUCOSAL | Status: DC | PRN
  Administered 2023-02-23: 3 mg via ORAL
  Filled 2023-02-23: qty 9

## 2023-02-23 NOTE — Progress Notes (Addendum)
 Progress Note   Patient: [name] FMW:[id number] DOB: 1935/04/01 DOA: 02/22/2023     1 DOS: the patient was seen and examined on 02/23/2023   Brief hospital course: 88 year old woman PMH including alcoholism presenting with fatigue and decreased oral intake, sent to ED for AKI after seeing PCP.  In the ED noted to have mild hyponatremia, AKI, suspected UTI, marked leukocytosis and imaging abnormalities of the liver and gallbladder with lymphadenopathy concerning for malignancy.  Consultants None   Procedures/Events None   Assessment and Plan: AKI likely secondary to volume depletion.   Creatinine without significant change, 2.62.  Unclear as to intake SI/show not charted. Admission creatinine 2.66.  Baseline creatinine few data points, 1.5-2.5. Suspect prerenal, failure to thrive, poor oral intake.  Continue IV fluids check BMP in AM.  E. coli bacteremia without sepsis. Suspected UTI Marked leukocytosis Urine culture and blood cultures pending.  Source may be urine although she does not have clear urinary symptoms.  Could be bacterial translocation given imaging of the abdomen is concerning for possible gallbladder malignancy and unclear lesion of the liver as well as adenopathy. Modest improvement in leukocytosis.  Continue empiric antibiotic therapy.  Follow-up culture data.  Abnormal gallbladder on imaging Liver lesion on imaging New periportal and retroaortic lymphadenopathy concerning for metastatic disease 1.3 cm left adrenal nodule On CT there is concern for gallbladder malignancy either primary or metastatic.  On ultrasound cholelithiasis without cholecystitis, cannot rule out mass. Will need MRI once can tolerate contrast, not today with AKI.  Mild hyponatremia  Likely secondary to poor solute intake.  Stable compared to yesterday, mild, does not appear to be symptomatic.  Continue IV fluids and follow BMP. . Bilateral lower lobe consolidation on CT concerning for  aspiration Possible aspiration pneumonia. Acute hypoxia without distress Mild hypoxia without distress.  On supplemental oxygen which is new. Speech therapy consultation.  Empiric antibiotics as above. Wean oxygen as tolerated  Aortic atherosclerosis   Fatigue, generalized weakness Secondary to acute illness.  PT evaluation.     Subjective:  Feels weak Some abd pain No urinary symptoms Has constipation Has been sick for 2 weeks  Physical Exam: Vitals:   02/23/23 0300 02/23/23 0354 02/23/23 0455 02/23/23 0820  BP: 122/62  (!) 112/56 (!) 106/55  Pulse: 80  76 83  Resp: (!) 22  17 (!) 25  Temp:  98.2 F (36.8 C) 98.2 F (36.8 C) 97.8 F (36.6 C)  TempSrc:  Oral    SpO2: 97%  98% 99%  Weight:      Height:       Physical Exam Vitals reviewed.  Constitutional:      General: She is not in acute distress.    Appearance: She is ill-appearing. She is not toxic-appearing.  Cardiovascular:     Rate and Rhythm: Normal rate and regular rhythm.     Heart sounds: No murmur heard. Pulmonary:     Effort: Pulmonary effort is normal. No respiratory distress.     Breath sounds: No wheezing, rhonchi or rales.  Abdominal:     General: There is no distension.     Palpations: Abdomen is soft. There is no mass.     Tenderness: There is no abdominal tenderness. There is no guarding.  Musculoskeletal:     Right lower leg: No edema.     Left lower leg: No edema.  Neurological:     Mental Status: She is alert.  Psychiatric:        Mood  and Affect: Mood normal.        Behavior: Behavior normal.     Data Reviewed: Sodium 129, stable compared to yesterday Creatinine without significant change, 2.62.  Admission creatinine 2.66.  Baseline creatinine few data points, 1.5-2.5. Marked leukocytosis slightly better today 29.4 Hemoglobin down to 8.5 with baseline perhaps around 10 Blood culture with E. coli urine culture pending Chest x-ray no acute disease CT findings abnormal include:  Bilateral lower lobe consolidation left greater than right suspicion for aspiration; mildly distended gallbladder with thickening concerning for gallbladder malignancy, recommend contrast enhanced MRI, liver abnormality, periportal and retroaortic lymphadenopathy concerning for metastatic disease; 1.3 cm left adrenal nodule. Gallbladder ultrasound noted cholelithiasis without cholecystitis.  Eccentric density in the gallbladder cannot rule out mass lesion  Family Communication: none prsent  Disposition: Status is: Inpatient Remains inpatient appropriate because: AKI  Planned Discharge Destination: Home    Time spent: 35 minutes  Author: Toribio Door, MD 02/23/2023 12:52 PM  For on call review [email or web address].

## 2023-02-23 NOTE — Evaluation (Signed)
 Occupational Therapy Evaluation Patient Details Name: [name] MRN: [id number] DOB: 12/08/1935 Today's Date: 02/23/2023   History of Present Illness 88 yo female presented to ED on 02/22/2023, pt directed to ED via PCP secondary to abn lab findings. Pt reports malaise and poor PO intake with constipation. Pt found to have AKI, abn UA and abn abdominal CT with lymphadenopathy and thickening of the gallbladder and liver concerning for possible malignancy. Pt also found to have acute hypoxia likely due to aspiration PNA with pt currently requiring supplemental O2. Pt PMH includes but is not limited to: macular degeneration, substance abuse, anemia, anxiety, goiter, herpes, HLD, HTN, and ulcer.   Clinical Impression   Pt admitted with the above diagnosis. Pt currently with functional limitations due to the deficits listed below (see OT Problem List). Prior to admit, pt was living alone independent with ADL/IADL tasks and functional mobility. Recommend follow home health OT services to focus on increasing endurance and activity tolerance.  Pt will benefit from acute skilled OT to increase their safety and independence with ADL and functional mobility for ADL to facilitate discharge. OT will continue to follow patient acutely.         If plan is discharge home, recommend the following: Assist for transportation;Assistance with cooking/housework    Functional Status Assessment  Patient has had a recent decline in their functional status and demonstrates the ability to make significant improvements in function in a reasonable and predictable amount of time.  Equipment Recommendations  Tub/shower seat;BSC/3in1;Other (comment) (TBD)       Precautions / Restrictions Precautions Precautions: Fall Restrictions Weight Bearing Restrictions Per Provider Order: No      Mobility Bed Mobility Overal bed mobility: Needs Assistance Bed Mobility: Supine to Sit, Sit to Supine     Supine to sit: Contact  guard, HOB elevated Sit to supine: Contact guard assist, HOB elevated        Transfers Overall transfer level: Needs assistance Equipment used: Rolling walker (2 wheels) Transfers: Sit to/from Stand Sit to Stand: Contact guard assist       Balance Overall balance assessment: Mild deficits observed, not formally tested      ADL either performed or assessed with clinical judgement   ADL     General ADL Comments: Mod I -self feeding. Pt requires CGA-SBA for BADL tasks.     Vision Baseline Vision/History: 1 Wears glasses;6 Macular Degeneration (AMD OU) Ability to See in Adequate Light: 1 Impaired Patient Visual Report: No change from baseline Vision Assessment?: No apparent visual deficits     Perception Perception: Not tested       Praxis Praxis: Not tested       Pertinent Vitals/Pain Pain Assessment Pain Assessment: No/denies pain (0 acute pain. Has history of chronic pain)     Extremity/Trunk Assessment Upper Extremity Assessment Upper Extremity Assessment: Generalized weakness;Right hand dominant   Lower Extremity Assessment Lower Extremity Assessment: Defer to PT evaluation   Cervical / Trunk Assessment Cervical / Trunk Assessment: Kyphotic   Communication Communication Communication: No apparent difficulties   Cognition Arousal: Alert Behavior During Therapy: WFL for tasks assessed/performed Overall Cognitive Status: Within Functional Limits for tasks assessed                    Home Living Family/patient expects to be discharged to:: Private residence Living Arrangements: Alone Available Help at Discharge: Family;Friend(s);Available PRN/intermittently Type of Home: Apartment Home Access: Level entry;Other (comment) (lives on first floor)     Home  Layout: One level     Bathroom Shower/Tub: Chief Strategy Officer: Standard     Home Equipment: Hand held shower head          Prior Functioning/Environment Prior Level of  Function : Independent/Modified Independent    Mobility Comments: Independent at baseline ADLs Comments: independent at baseline. Approximately 2 weeks ago, pt began to experience increased fatigue and decreased activity tolerance and has been unable to complete required daily tasks.        OT Problem List: Decreased activity tolerance      OT Treatment/Interventions: Self-care/ADL training;Therapeutic activities;Patient/family education;DME and/or AE instruction    OT Goals(Current goals can be found in the care plan section) Acute Rehab OT Goals Patient Stated Goal: to get stronger OT Goal Formulation: With patient Time For Goal Achievement: 03/09/23 Potential to Achieve Goals: Good  OT Frequency: Min 1X/week       AM-PAC OT 6 Clicks Daily Activity     Outcome Measure Help from another person eating meals?: None Help from another person taking care of personal grooming?: A Little Help from another person toileting, which includes using toliet, bedpan, or urinal?: A Little Help from another person bathing (including washing, rinsing, drying)?: A Little Help from another person to put on and taking off regular upper body clothing?: A Little Help from another person to put on and taking off regular lower body clothing?: A Little 6 Click Score: 19   End of Session Equipment Utilized During Treatment: Oxygen  Activity Tolerance: Patient tolerated treatment well Patient left: in bed;with call bell/phone within reach;with family/visitor present  OT Visit Diagnosis: Muscle weakness (generalized) (M62.81)                Time: 8566-8558 OT Time Calculation (min): 8 min Charges:  OT General Charges $OT Visit: 1 Visit OT Evaluation $OT Eval Low Complexity: 1 Low  Leita Howell, OTR/L,CBIS  Supplemental OT - [facility] and [facility] Secure Chat Preferred    Briceida Rasberry, Leita BIRCH 02/23/2023, 2:48 PM

## 2023-02-23 NOTE — Plan of Care (Signed)
  Problem: Education: Goal: Knowledge of General Education information will improve Description: Including pain rating scale, medication(s)/side effects and non-pharmacologic comfort measures Outcome: Progressing   Problem: Clinical Measurements: Goal: Will remain free from infection Outcome: Progressing   Problem: Activity: Goal: Risk for activity intolerance will decrease Outcome: Progressing   Problem: Nutrition: Goal: Adequate nutrition will be maintained Outcome: Progressing   Problem: Coping: Goal: Level of anxiety will decrease Outcome: Progressing   Problem: Pain Management: Goal: General experience of comfort will improve Outcome: Progressing   Problem: Safety: Goal: Ability to remain free from injury will improve Outcome: Progressing   Problem: Skin Integrity: Goal: Risk for impaired skin integrity will decrease Outcome: Progressing

## 2023-02-23 NOTE — Evaluation (Signed)
 Physical Therapy Evaluation Patient Details Name: [name] MRN: [id number] DOB: [birth year] Today's Date: 02/23/2023  History of Present Illness  88 yo female presented to ED on 02/22/2023, pt directed to ED via PCP secondary to abn lab findings. Pt reports malaise and poor PO intake with constipation. Pt found to have AKI, abn UA and abn abdominal CT with lymphadenopathy and thickening of the gallbladder and liver concerning for possible malignancy. Pt also found to have acute hypoxia likely due to aspiration PNA with pt currently requiring supplemental O2. Pt PMH includes but is not limited to: macular degeneration, substance abuse, anemia, anxiety, goiter, herpes, HLD, HTN, and ulcer.  Clinical Impression   Pt admitted with above diagnosis.  Pt currently with functional limitations due to the deficits listed below (see PT Problem List). Pt in bed when PT arrived. Pt significant other present. Pt agreeable to therapy intervention pt reported need to void. Pt reported that she is not SOB at rest and her O2 readings have been all over the place and it was sudden and that if she did not have the supplemental O2 she would not be making it. Pt stated that she does not have supplemental O2 in home setting PLOF. Pt O2 4 L/min and 98-100% at rest, 3 L/min 94-97% and on 2 L/min with functional mobility tasks 90-91% no reports of SOB. Pt indicating fatigue requiring prolonged seated rest break seated on commode following voiding. Pt required CGA and cues for supine <> sit, pt required CGA for transfer tasks with cues, gait tasks with RW, CGA, cues 45 feet x 2. Pt left in bed with significant other present, all needs in place, on 3 L/min and 97%.  Pt will benefit from acute skilled PT to increase their independence and safety with mobility to allow discharge.         If plan is discharge home, recommend the following: A little help with walking and/or transfers;A little help with  bathing/dressing/bathroom;Assistance with cooking/housework;Assist for transportation   Can travel by private vehicle        Equipment Recommendations Rolling walker (2 wheels)  Recommendations for Other Services       Functional Status Assessment Patient has had a recent decline in their functional status and demonstrates the ability to make significant improvements in function in a reasonable and predictable amount of time.     Precautions / Restrictions Precautions Precautions: Fall      Mobility  Bed Mobility Overal bed mobility: Needs Assistance Bed Mobility: Supine to Sit, Sit to Supine     Supine to sit: Contact guard, HOB elevated Sit to supine: Contact guard assist, HOB elevated   General bed mobility comments: min cues and increased time. PT redirected pt when pt attempting to return to bed by climbing on hands and knees, pt required multimodal cues for safe and proper technique    Transfers Overall transfer level: Needs assistance Equipment used: Rolling walker (2 wheels) Transfers: Sit to/from Stand Sit to Stand: Contact guard assist           General transfer comment: cues for safety and RW management, UE placement and body position    Ambulation/Gait Ambulation/Gait assistance: Contact guard assist Gait Distance (Feet): 45 Feet Assistive device: Rolling walker (2 wheels) Gait Pattern/deviations: Step-to pattern, Narrow base of support, Trunk flexed Gait velocity: decreased     General Gait Details: min cues for safety, posture, RW management, proper distance from RW and noted deficits with hazard recognition on L  Stairs            Wheelchair Mobility     Tilt Bed    Modified Rankin (Stroke Patients Only)       Balance Overall balance assessment: Mild deficits observed, not formally tested (pt deneis falls)                                           Pertinent Vitals/Pain Pain Assessment Pain Assessment:  No/denies pain (pt reports no acute pain, chronic pain at baseline and unable to rate on numeric scale)    Home Living Family/patient expects to be discharged to:: Private residence Living Arrangements: Spouse/significant other Available Help at Discharge: Family;Friend(s) Type of Home: Apartment Home Access: Level entry;Elevator       Home Layout: One level Home Equipment: None      Prior Function Prior Level of Function : Independent/Modified Independent             Mobility Comments: pt reports IND no AD with all ADLs, self care tasks and IADLs pt states over the past few weeks it has been hard to do for herself       Extremity/Trunk Assessment        Lower Extremity Assessment Lower Extremity Assessment: Generalized weakness    Cervical / Trunk Assessment Cervical / Trunk Assessment: Other exceptions;Kyphotic (head forward)  Communication   Communication Communication: No apparent difficulties (pt required occational redirection and some apparent underlying confusion and unable to determine if baseline, significant other present and did not provide insight. pt stated she has been in the ED for days and that no one has helped or fed her.)  Cognition Arousal: Alert Behavior During Therapy: [facility] for tasks assessed/performed Overall Cognitive Status: Within Functional Limits for tasks assessed                                          General Comments      Exercises     Assessment/Plan    PT Assessment Patient needs continued PT services  PT Problem List Decreased strength;Decreased activity tolerance;Decreased balance;Decreased knowledge of use of DME;Decreased safety awareness;Cardiopulmonary status limiting activity       PT Treatment Interventions DME instruction;Gait training;Functional mobility training;Therapeutic activities;Therapeutic exercise;Balance training;Neuromuscular re-education;Patient/family education    PT Goals (Current  goals can be found in the Care Plan section)  Acute Rehab PT Goals Patient Stated Goal: to be able to go home and not have the O2 readings jump all over the place PT Goal Formulation: With patient Time For Goal Achievement: 03/09/23 Potential to Achieve Goals: Good    Frequency Min 1X/week     Co-evaluation               AM-PAC PT 6 Clicks Mobility  Outcome Measure Help needed turning from your back to your side while in a flat bed without using bedrails?: A Little Help needed moving from lying on your back to sitting on the side of a flat bed without using bedrails?: A Little Help needed moving to and from a bed to a chair (including a wheelchair)?: A Little Help needed standing up from a chair using your arms (e.g., wheelchair or bedside chair)?: A Little Help needed to walk in hospital room?: A Little Help needed climbing 3-5  steps with a railing? : Total 6 Click Score: 16    End of Session Equipment Utilized During Treatment: Gait belt;Oxygen Activity Tolerance: Patient limited by fatigue Patient left: in bed;with call bell/phone within reach;with family/visitor present Nurse Communication: Mobility status PT Visit Diagnosis: Unsteadiness on feet (R26.81);Other abnormalities of gait and mobility (R26.89);Muscle weakness (generalized) (M62.81);Difficulty in walking, not elsewhere classified (R26.2)    Time: 8879-8845 PT Time Calculation (min) (ACUTE ONLY): 34 min   Charges:   PT Evaluation $PT Eval Low Complexity: 1 Low PT Treatments $Gait Training: 8-22 mins PT General Charges $$ ACUTE PT VISIT: 1 Visit         [place], PT Acute Rehab   [place] VEAR Drone 02/23/2023, 1:40 PM

## 2023-02-23 NOTE — Progress Notes (Signed)
 PHARMACY - PHYSICIAN COMMUNICATION CRITICAL VALUE ALERT - BLOOD CULTURE IDENTIFICATION (BCID)  [name] is an 88 y.o. female who presented to [facility] on 02/22/2023 with UTI, leukocytosis, AKI.  Assessment:  BCID + E. Coli (no gene resistance) in 2/2 bottles   Name of physician (or Provider) Contacted: Lynwood Kipper, FNP  Current antibiotics: Ceftriaxone  1g IV q24h  Changes to prescribed antibiotics recommended:  Recommendations accepted by provider - Increase Ceftriaxone  to 2g IV q24h  Results for orders placed or performed during the hospital encounter of 02/22/23  Blood Culture ID Panel (Reflexed) (Collected: 02/22/2023  1:35 PM)  Result Value Ref Range   Enterococcus faecalis NOT DETECTED NOT DETECTED   Enterococcus Faecium NOT DETECTED NOT DETECTED   Listeria monocytogenes NOT DETECTED NOT DETECTED   Staphylococcus species NOT DETECTED NOT DETECTED   Staphylococcus aureus (BCID) NOT DETECTED NOT DETECTED   Staphylococcus epidermidis NOT DETECTED NOT DETECTED   Staphylococcus lugdunensis NOT DETECTED NOT DETECTED   Streptococcus species NOT DETECTED NOT DETECTED   Streptococcus agalactiae NOT DETECTED NOT DETECTED   Streptococcus pneumoniae NOT DETECTED NOT DETECTED   Streptococcus pyogenes NOT DETECTED NOT DETECTED   A.calcoaceticus-baumannii NOT DETECTED NOT DETECTED   Bacteroides fragilis NOT DETECTED NOT DETECTED   Enterobacterales DETECTED (A) NOT DETECTED   Enterobacter cloacae complex NOT DETECTED NOT DETECTED   Escherichia coli DETECTED (A) NOT DETECTED   Klebsiella aerogenes NOT DETECTED NOT DETECTED   Klebsiella oxytoca NOT DETECTED NOT DETECTED   Klebsiella pneumoniae NOT DETECTED NOT DETECTED   Proteus species NOT DETECTED NOT DETECTED   Salmonella species NOT DETECTED NOT DETECTED   Serratia marcescens NOT DETECTED NOT DETECTED   Haemophilus influenzae NOT DETECTED NOT DETECTED   Neisseria meningitidis NOT DETECTED NOT DETECTED   Pseudomonas aeruginosa NOT  DETECTED NOT DETECTED   Stenotrophomonas maltophilia NOT DETECTED NOT DETECTED   Candida albicans NOT DETECTED NOT DETECTED   Candida auris NOT DETECTED NOT DETECTED   Candida glabrata NOT DETECTED NOT DETECTED   Candida krusei NOT DETECTED NOT DETECTED   Candida parapsilosis NOT DETECTED NOT DETECTED   Candida tropicalis NOT DETECTED NOT DETECTED   Cryptococcus neoformans/gattii NOT DETECTED NOT DETECTED   CTX-M ESBL NOT DETECTED NOT DETECTED   Carbapenem resistance IMP NOT DETECTED NOT DETECTED   Carbapenem resistance KPC NOT DETECTED NOT DETECTED   Carbapenem resistance NDM NOT DETECTED NOT DETECTED   Carbapenem resist OXA 48 LIKE NOT DETECTED NOT DETECTED   Carbapenem resistance VIM NOT DETECTED NOT DETECTED    [place] Arvin Fletcher, PharmD 02/23/2023  5:25 AM

## 2023-02-23 NOTE — ED Notes (Signed)
 Pt assisted to restroom via wheelchair

## 2023-02-23 NOTE — Plan of Care (Signed)

## 2023-02-24 DIAGNOSIS — N179 Acute kidney failure, unspecified: Secondary | ICD-10-CM | POA: Diagnosis not present

## 2023-02-24 LAB — BASIC METABOLIC PANEL
Anion gap: 10 (ref 5–15)
BUN: 39 mg/dL — ABNORMAL HIGH (ref 8–23)
CO2: 15 mmol/L — ABNORMAL LOW (ref 22–32)
Calcium: 7.5 mg/dL — ABNORMAL LOW (ref 8.9–10.3)
Chloride: 103 mmol/L (ref 98–111)
Creatinine, Ser: 2.2 mg/dL — ABNORMAL HIGH (ref 0.44–1.00)
GFR, Estimated: 21 mL/min — ABNORMAL LOW (ref >60)
Glucose, Bld: 130 mg/dL — ABNORMAL HIGH (ref 70–99)
Potassium: 3.7 mmol/L (ref 3.5–5.1)
Sodium: 128 mmol/L — ABNORMAL LOW (ref 135–145)

## 2023-02-24 LAB — CBC
HCT: 25.3 % — ABNORMAL LOW (ref 36.0–46.0)
Hemoglobin: 7.8 g/dL — ABNORMAL LOW (ref 12.0–15.0)
MCH: 26 pg (ref 26.0–34.0)
MCHC: 30.8 g/dL (ref 30.0–36.0)
MCV: 84.3 fL (ref 80.0–100.0)
Platelets: 279 10*3/uL (ref 150–400)
RBC: 3 MIL/uL — ABNORMAL LOW (ref 3.87–5.11)
RDW: 15.1 % (ref 11.5–15.5)
WBC: 23.9 10*3/uL — ABNORMAL HIGH (ref 4.0–10.5)
nRBC: 0 % (ref 0.0–0.2)

## 2023-02-24 NOTE — Progress Notes (Signed)
 PROGRESS NOTE    [name]  FMW:[id number] DOB: 1935/06/01 DOA: 02/22/2023 PCP: Caro Harlene POUR, NP  Chief Complaint  Patient presents with   Urinary Tract Infection   AKI    Hospital Course:  [name] is 88 y.o. female with a of alcohol  abuse presenting with fatigue and decreased p.o. intake.  She saw her PCP in office and was found to have AKI and thus recommended for ED evaluation.  In the ED she was found of hyponatremia 130, AKI with creatinine 2.6, UTI, leukocytosis at 32K, and CT scan which revealed liver and gallbladder abnormalities as well as lymphadenopathy concerning for malignancy.  Subjective: No acute events overnight.  On arrival this morning patient is working with speech therapy services.  She has no overt signs of aspiration at this time, however does have difficulty finishing 3 ounces of water . Patient has many questions about her ongoing care.  I have discussed her hospitalization extensively with her as well as with her boyfriend who is at bedside   Objective: Vitals:   02/23/23 1417 02/23/23 2147 02/24/23 0239 02/24/23 0241  BP: 121/61 (!) 109/57 118/63   Pulse: 82 80 83   Resp: 16 18 16    Temp: 98.4 F (36.9 C) 99.2 F (37.3 C) 98.4 F (36.9 C)   TempSrc:  Oral Oral   SpO2: 98% 96% 94%   Weight:    49.5 kg  Height:        Intake/Output Summary (Last 24 hours) at 02/24/2023 0838 Last data filed at 02/24/2023 0257 Gross per 24 hour  Intake 1820 ml  Output 300 ml  Net 1520 ml   Filed Weights   02/22/23 1135 02/24/23 0241  Weight: 46.7 kg 49.5 kg    Examination: General exam: Appears calm and comfortable, NAD, then Respiratory system: No work of breathing, symmetric chest wall expansion Cardiovascular system: S1 & S2 heard, RRR.  Gastrointestinal system: Abdomen is nondistended, soft and nontender.  Neuro: Alert and oriented. No focal neurological deficits. Extremities: Symmetric, expected ROM Skin: No rashes, lesions Psychiatry: Demonstrates  appropriate judgement and insight. Mood & affect appropriate for situation.   Assessment & Plan:  Principal Problem:   AKI (acute kidney injury) (HCC) Active Problems:   Essential hypertension, benign   UTI (urinary tract infection)   Aortic atherosclerosis (HCC)   Volume depletion   Generalized weakness   Hyponatremia   Bacteremia    AKI superimposed on CKD - Creatinine 1 year ago 1.5 - Creatinine 2.6 on arrival - Presumed prerenal injury secondary to volume depletion given poor p.o. intake on arrival - Will continue to trend, improving some now - Avoid nephrotoxic meds, renally dose when  E. coli bacteremia - Suspect urinary source, urine cultures now positive as well - Also possibility for abdominal source given concern for possible gallbladder malignancy and potential liver lesion - On ceftriaxone , will continue this.  Follow E. coli susceptibilities  Leukocytosis - WBC 32 on arrival, downtrending now.  Still elevated. - May be secondary to bacteremia, antibiotics as above - Could also be complicated by malignancy given abnormal CT findings.  Abnormal gallbladder on CT imaging Liver lesion on CT imaging New periportal and retroaortic lymphadenopathy concerning for metastatic disease 1.3 cm left adrenal nodule - Initially seen on CT without con.  Ultrasound with cholelithiasis but cannot rule out mass - AKI is improving some but ultimately needs MRI with contrast to better evaluate these lesions.  Continue to monitor creatinine improvement  Hyponatremia -  Likely secondary to poor p.o. intake - Continue IV fluids - Continue CMP trending  Bilateral lower lobe consolidation on CT, concerning for aspiration Acute hypoxic respiratory failure - Does not require home O2 at baseline currently necessitating 2 L O2, will continue to wean as tolerated - Currently on ceftriaxone  as above - SLP for barium swallow tomorrow.  Normocytic anemia - Hemoglobin 9.5 on arrival,  downtrending now though I suspect this is secondary to hemodilution given she has been on IV fluids - Continue to trend - Suspect iron  deficiency given poor p.o. intake, iron  studies ordered.  Aortic atherosclerosis - May benefit from aspirin and statin  Fatigue Generalized weakness Advanced Age - PT/OT as tolerated - Likely acute worsening due to acute illness    DVT prophylaxis: Heparin    Code Status: Limited: Do not attempt resuscitation (DNR) -DNR-LIMITED -Do Not Intubate/DNI  Family Communication: Boyfriend at bedside and present for discussion Disposition:  Status is: Inpatient     Antimicrobials:  Anti-infectives (From admission, onward)    Start     Dose/Rate Route Frequency Ordered Stop   02/23/23 1500  cefTRIAXone  (ROCEPHIN ) 2 g in sodium chloride  0.9 % 100 mL IVPB        2 g 200 mL/hr over 30 Minutes Intravenous Every 24 hours 02/23/23 0524     02/23/23 1400  cefTRIAXone  (ROCEPHIN ) 1 g in sodium chloride  0.9 % 100 mL IVPB  Status:  Discontinued        1 g 200 mL/hr over 30 Minutes Intravenous Every 24 hours 02/22/23 1820 02/23/23 0524   02/22/23 1400  cefTRIAXone  (ROCEPHIN ) 2 g in sodium chloride  0.9 % 100 mL IVPB        2 g 200 mL/hr over 30 Minutes Intravenous  Once 02/22/23 1355 02/22/23 1738       Data Reviewed: I have personally reviewed following labs and imaging studies CBC: Recent Labs  Lab 02/19/23 1354 02/22/23 1335 02/22/23 1927 02/23/23 0412 02/24/23 0520  WBC 22.1* 32.4* 33.1* 29.4* 23.9*  NEUTROABS 14,630* 28.3*  --   --   --   HGB 10.0* 9.5* 9.2* 8.5* 7.8*  HCT 31.6* 28.9* 29.1* 26.0* 25.3*  MCV 82.5 81.9 82.2 81.0 84.3  PLT 213 196 230 237 279   Basic Metabolic Panel: Recent Labs  Lab 02/19/23 1354 02/22/23 1435 02/22/23 1927 02/23/23 0412 02/24/23 0520  NA 132* 130*  --  129* 128*  K 4.1 3.7  --  3.5 3.7  CL 97* 101  --  103 103  CO2 21 19*  --  16* 15*  GLUCOSE 117 118*  --  111* 130*  BUN 35* 36*  --  34* 39*   CREATININE 2.49* 2.66* 2.70* 2.62* 2.20*  CALCIUM 8.9 7.6*  --  7.2* 7.5*  MG  --   --   --  2.1  --    GFR: Estimated Creatinine Clearance: 14.6 mL/min (A) (by C-G formula based on SCr of 2.2 mg/dL (H)). Liver Function Tests: Recent Labs  Lab 02/19/23 1354 02/22/23 1435  AST 20 18  ALT 13 10  ALKPHOS  --  100  BILITOT 0.5 0.8  PROT 5.9* 5.2*  ALBUMIN  --  2.2*   CBG: No results for input(s): GLUCAP in the last 168 hours.  Recent Results (from the past 240 hours)  Urine Culture     Status: Abnormal   Collection Time: 02/19/23  1:54 PM  Result Value Ref Range Status   MICRO NUMBER: [id number]  Final  SPECIMEN QUALITY: Adequate  Final   Sample Source URINE, CLEAN CATCH  Final   STATUS: FINAL  Final   ISOLATE 1: Escherichia coli (A)  Final    Comment: Greater than 100,000 CFU/mL of Escherichia coli      Susceptibility   Escherichia coli - URINE CULTURE, REFLEX    AMOX/CLAVULANIC <=2 Sensitive     AMPICILLIN <=2 Sensitive     AMPICILLIN/SULBACTAM <=2 Sensitive     CEFAZOLIN* <=4 Not Reportable      * For infections other than uncomplicated UTI caused by E. coli, K. pneumoniae or P. mirabilis: Cefazolin is resistant if MIC > or = 8 mcg/mL. (Distinguishing susceptible versus intermediate for isolates with MIC < or = 4 mcg/mL requires additional testing.) For uncomplicated UTI caused by E. coli, K. pneumoniae or P. mirabilis: Cefazolin is susceptible if MIC <32 mcg/mL and predicts susceptible to the oral agents cefaclor, cefdinir, cefpodoxime, cefprozil, cefuroxime, cephalexin  and loracarbef.     CEFTAZIDIME <=1 Sensitive     CEFEPIME <=1 Sensitive     CEFTRIAXONE  <=1 Sensitive     CIPROFLOXACIN  <=0.25 Sensitive     LEVOFLOXACIN  <=0.12 Sensitive     GENTAMICIN <=1 Sensitive     IMIPENEM <=0.25 Sensitive     NITROFURANTOIN <=16 Sensitive     PIP/TAZO <=4 Sensitive     TOBRAMYCIN <=1 Sensitive     TRIMETH/SULFA* <=20 Sensitive      * For infections other than  uncomplicated UTI caused by E. coli, K. pneumoniae or P. mirabilis: Cefazolin is resistant if MIC > or = 8 mcg/mL. (Distinguishing susceptible versus intermediate for isolates with MIC < or = 4 mcg/mL requires additional testing.) For uncomplicated UTI caused by E. coli, K. pneumoniae or P. mirabilis: Cefazolin is susceptible if MIC <32 mcg/mL and predicts susceptible to the oral agents cefaclor, cefdinir, cefpodoxime, cefprozil, cefuroxime, cephalexin  and loracarbef. Legend: S = Susceptible  I = Intermediate R = Resistant  NS = Not susceptible SDD = Susceptible Dose Dependent * = Not Tested  NR = Not Reported **NN = See Therapy Comments   Culture, blood (routine x 2)     Status: None (Preliminary result)   Collection Time: 02/22/23  1:35 PM   Specimen: BLOOD  Result Value Ref Range Status   Specimen Description   Final    BLOOD RIGHT ANTECUBITAL Performed at [facility], [address], [place], [place] [address]    Special Requests   Final    BOTTLES DRAWN AEROBIC AND ANAEROBIC Blood Culture results may not be optimal due to an inadequate volume of blood received in culture bottles Performed at [facility], [address], [place], [place] [address]    Culture  Setup Time   Final    GRAM NEGATIVE RODS IN BOTH AEROBIC AND ANAEROBIC BOTTLES CRITICAL RESULT CALLED TO, READ BACK BY AND VERIFIED WITH: L POINDEXTER,PHARMD@0515  02/23/23 MK Performed at [facility], [address], [place], [place] [address]    Culture GRAM NEGATIVE RODS  Final   Report Status PENDING  Incomplete  Blood Culture ID Panel (Reflexed)     Status: Abnormal   Collection Time: 02/22/23  1:35 PM  Result Value Ref Range Status   Enterococcus faecalis NOT DETECTED NOT DETECTED Final   Enterococcus Faecium NOT DETECTED NOT DETECTED Final   Listeria monocytogenes NOT DETECTED NOT DETECTED Final   Staphylococcus species NOT DETECTED NOT DETECTED Final    Staphylococcus aureus (BCID) NOT DETECTED NOT DETECTED  Final   Staphylococcus epidermidis NOT DETECTED NOT DETECTED Final   Staphylococcus lugdunensis NOT DETECTED NOT DETECTED Final   Streptococcus species NOT DETECTED NOT DETECTED Final   Streptococcus agalactiae NOT DETECTED NOT DETECTED Final   Streptococcus pneumoniae NOT DETECTED NOT DETECTED Final   Streptococcus pyogenes NOT DETECTED NOT DETECTED Final   A.calcoaceticus-baumannii NOT DETECTED NOT DETECTED Final   Bacteroides fragilis NOT DETECTED NOT DETECTED Final   Enterobacterales DETECTED (A) NOT DETECTED Final    Comment: Enterobacterales represent a large order of gram negative bacteria, not a single organism. CRITICAL RESULT CALLED TO, READ BACK BY AND VERIFIED WITH: L POINDEXTER,PHARMD@0515  02/23/23 MK    Enterobacter cloacae complex NOT DETECTED NOT DETECTED Final   Escherichia coli DETECTED (A) NOT DETECTED Final    Comment: CRITICAL RESULT CALLED TO, READ BACK BY AND VERIFIED WITH: L POINDEXTER,PHARMD@0515  02/23/23 MK    Klebsiella aerogenes NOT DETECTED NOT DETECTED Final   Klebsiella oxytoca NOT DETECTED NOT DETECTED Final   Klebsiella pneumoniae NOT DETECTED NOT DETECTED Final   Proteus species NOT DETECTED NOT DETECTED Final   Salmonella species NOT DETECTED NOT DETECTED Final   Serratia marcescens NOT DETECTED NOT DETECTED Final   Haemophilus influenzae NOT DETECTED NOT DETECTED Final   Neisseria meningitidis NOT DETECTED NOT DETECTED Final   Pseudomonas aeruginosa NOT DETECTED NOT DETECTED Final   Stenotrophomonas maltophilia NOT DETECTED NOT DETECTED Final   Candida albicans NOT DETECTED NOT DETECTED Final   Candida auris NOT DETECTED NOT DETECTED Final   Candida glabrata NOT DETECTED NOT DETECTED Final   Candida krusei NOT DETECTED NOT DETECTED Final   Candida parapsilosis NOT DETECTED NOT DETECTED Final   Candida tropicalis NOT DETECTED NOT DETECTED Final   Cryptococcus neoformans/gattii NOT DETECTED NOT  DETECTED Final   CTX-M ESBL NOT DETECTED NOT DETECTED Final   Carbapenem resistance IMP NOT DETECTED NOT DETECTED Final   Carbapenem resistance KPC NOT DETECTED NOT DETECTED Final   Carbapenem resistance NDM NOT DETECTED NOT DETECTED Final   Carbapenem resist OXA 48 LIKE NOT DETECTED NOT DETECTED Final   Carbapenem resistance VIM NOT DETECTED NOT DETECTED Final    Comment: Performed at [facility], [address] [place], [place] [address]     Radiology Studies: US  Abdomen Limited RUQ (LIVER/GB) Result Date: 02/22/2023 CLINICAL DATA:  Abdominal pain. EXAM: ULTRASOUND ABDOMEN LIMITED RIGHT UPPER QUADRANT COMPARISON:  CT abdomen and pelvis 02/22/2023 FINDINGS: Gallbladder: Cholelithiasis with gallstones measuring up to 2 cm diameter. Layering sludge in the gallbladder. Murphy's sign is negative. Eccentric density in the gallbladder wall likely representing tumefactive sludge but gallbladder mass lesion is not excluded. Suggest follow-up with contrast-enhanced MRI/MRCP. Common bile duct: Diameter: 7 mm, upper limits of normal. Liver: No focal lesion identified. Within normal limits in parenchymal echogenicity. The focal liver lesion seen at CT is not well demonstrated sonographically. Portal vein is patent on color Doppler imaging with normal direction of blood flow towards the liver. Other: None. IMPRESSION: 1. Cholelithiasis and gallbladder sludge without evidence of acute cholecystitis. 2. Eccentric density in the gallbladder may represent tumefactive sludge but gallbladder mass lesion is not excluded. Suggest follow-up with contrast-enhanced MRI/MRCP. Electronically Signed   By: Elsie Gravely M.D.   On: 02/22/2023 18:58   CT Renal Stone Study Result Date: 02/22/2023 CLINICAL DATA:  Urinary tract infection and acute kidney injury. * Tracking Code: BO * EXAM: CT ABDOMEN AND PELVIS WITHOUT CONTRAST TECHNIQUE: Multidetector CT imaging of the abdomen and pelvis was performed following the  standard protocol without IV contrast. RADIATION DOSE REDUCTION: This exam was performed according to the departmental dose-optimization program which includes automated exposure control, adjustment of the mA and/or kV according to patient size and/or use of iterative reconstruction technique. COMPARISON:  CT abdomen and pelvis dated 07/17/2021 FINDINGS: Lower chest: Bilateral lower lobe irregular atelectasis/consolidation, left-greater-than-right. Trace left pleural effusion. Partially imaged heart size is normal. Hepatobiliary: Subtle hypoattenuating focus measuring 1.0 cm in segment 7/8 (2:8), new from 07/17/2021. Unchanged segment 4 cyst. No intra or extrahepatic biliary ductal dilation. Mildly distended gallbladder with partially calcified irregular mural thickening along the medial wall (2:33). Pancreas: Similar pancreatic parenchymal atrophy and dilation of the pancreatic duct in the tail up to 5 mm. Spleen: Normal in size without focal abnormality. Adrenals/Urinary Tract: 1.3 cm left adrenal nodule measures 11 HU, however appears new (2:17). No definite right adrenal nodule. No suspicious renal mass on this noncontrast enhanced examination , calculi or hydronephrosis. No focal bladder wall thickening. Stomach/Bowel: Multiple surgical clips at the gastroesophageal junction. Normal appearance of the stomach. Postsurgical changes of small bowel in the left upper quadrant. Patent anastomosis. No evidence of bowel wall thickening, distention, or inflammatory changes. Appendix is not discretely seen. Vascular/Lymphatic: Aortic atherosclerosis. Multi station new periportal and retroaortic lymphadenopathy, for example 2.0 cm portacaval (2:25) and 1.2 cm left para-aortic (2:29). Reproductive: No adnexal masses. Other: Trace presacral edema.  No free air. Musculoskeletal: No acute or abnormal lytic or blastic osseous lesions. IMPRESSION: 1. Bilateral lower lobe irregular atelectasis/consolidation,  left-greater-than-right, suspicious for aspiration or pneumonia. 2. Mildly distended gallbladder with partially calcified irregular mural thickening along the medial wall, suspicious for gallbladder malignancy, either primary or metastatic. Recommend further evaluation with contrast-enhanced MRI abdomen. Alternatively, right upper quadrant ultrasound examination can be considered. 3. Subtle hypoattenuating focus measuring 1.0 cm in hepatic segment 7/8, new from 07/17/2021, indeterminate, however suspicious for metastatic disease. This finding can be evaluated at the same time on MRI abdomen. 4. Multi station new periportal and retroaortic lymphadenopathy, suspicious for metastatic disease. 5. New 1.3 cm left adrenal nodule, indeterminate. This finding can be evaluated at the same time on MRI abdomen. 6.  Aortic Atherosclerosis (ICD10-I70.0). Electronically Signed   By: Limin  Xu M.D.   On: 02/22/2023 17:19   DG Chest Port 1 View Result Date: 02/22/2023 CLINICAL DATA:  Shortness of breath. EXAM: PORTABLE CHEST 1 VIEW COMPARISON:  None Available. FINDINGS: The linear area of scarring/atelectasis in the left retrocardiac region. Bilateral lungs appear hyperexpanded and hyperlucent with coarse bronchovascular markings, concerning for underlying COPD. Bilateral lungs otherwise appear clear. No dense consolidation or lung collapse. Bilateral costophrenic angles are clear. Normal cardio-mediastinal silhouette. No acute osseous abnormalities. The soft tissues are within normal limits. IMPRESSION: *Left retrocardiac scarring/atelectasis.  Probable COPD. Electronically Signed   By: Ree Molt M.D.   On: 02/22/2023 14:46    Scheduled Meds:  gabapentin   200 mg Oral QHS   heparin   5,000 Units Subcutaneous Q8H   mirabegron  ER  25 mg Oral Daily   nebivolol   5 mg Oral Daily   pantoprazole   40 mg Oral Daily   sodium chloride  flush  3 mL Intravenous Q12H   Continuous Infusions:  sodium chloride  75 mL/hr at 02/23/23  2042   cefTRIAXone  (ROCEPHIN )  IV Stopped (02/23/23 1551)     LOS: 2 days    Time spent:  55min  Estie Sproule, DO [facility]  To contact the attending physician between 7A-7P please use Epic Chat. To contact the covering  physician during after hours 7P-7A, please review Amion.   02/24/2023, 8:38 AM   *This document has been created with the assistance of dictation software. Please excuse typographical errors. *

## 2023-02-24 NOTE — Progress Notes (Signed)
 Occupational Therapy Treatment Patient Details Name: [name] MRN: [id number] DOB: 1935/10/14 Today's Date: 02/24/2023   History of present illness 88 yo female presented to ED on 02/22/2023, pt directed to ED via PCP secondary to abn lab findings. Pt reports malaise and poor PO intake with constipation. Pt found to have AKI, abn UA and abn abdominal CT with lymphadenopathy and thickening of the gallbladder and liver concerning for possible malignancy. Pt also found to have acute hypoxia likely due to aspiration PNA with pt currently requiring supplemental O2. Pt PMH includes but is not limited to: macular degeneration, substance abuse, anemia, anxiety, goiter, herpes, HLD, HTN, and ulcer.   OT comments  Patient treated by Occupational Therapy today with no further acute OT needs identified. All education has been completed and the patient has handouts to reinforce all information as well as HH OT setup if pt has further or future questions.  See below for any follow-up Occupational Therapy or equipment needs. OT is signing off. Thank you for this referral.       If plan is discharge home, recommend the following:  Assist for transportation;Assistance with cooking/housework   Equipment Recommendations  BSC/3in1;Other (comment);Tub/shower bench    Recommendations for Other Services      Precautions / Restrictions Precautions Precautions: Fall Restrictions Weight Bearing Restrictions Per Provider Order: No       Mobility Bed Mobility               General bed mobility comments: Pt refused. Defer to acute PT to avoid duplication of care. Acute OT signing off.    Transfers                         Balance                                           ADL either performed or assessed with clinical judgement   ADL                                         General ADL Comments: Pt refused OOB, irritable and verbalized feeling depressed,  then stated that she was not depressed. Pt did confirm feeling overwhelmed within last 2 weeks and feels as if her life has changed forever.  Per OT POC, pt and boyfriend were educatied at length on energy conservation with 2 handouts to support education, ed pt on the 4 P's with examples for each. Long handled AE handout includes to integrate use into EC concepts. Ed on bathroom DME options and uses. Pt with questions re: IADLs at home. Recommended kitchen stool while cooking for rest and sitting to prep meals to avoid over fatigue. Deferred to [facility] OT to problem solve further solutions in pt's own environment. OT covered fall prevention and strategies during ADLs to increase safety. Pt and S.O. verbalized understanding to all information and agreeable to OT sign off.    Extremity/Trunk Assessment              Vision Baseline Vision/History: 1 Wears glasses;6 Macular Degeneration Ability to See in Adequate Light: 1 Impaired Patient Visual Report: No change from baseline     Perception     Praxis      Cognition Arousal: Alert  Behavior During Therapy: Agitated Overall Cognitive Status: Within Functional Limits for tasks assessed                                 General Comments: Overwhelmed        Exercises      Shoulder Instructions       General Comments      Pertinent Vitals/ Pain       Pain Assessment Pain Assessment: No/denies pain  Home Living                                          Prior Functioning/Environment              Frequency           Progress Toward Goals  OT Goals(current goals can now be found in the care plan section)  Progress towards OT goals: Goals met/education completed, patient discharged from OT  Acute Rehab OT Goals Patient Stated Goal: Figure out how to vaccuum home. Agreeable to work with [facility] OT on this. OT Goal Formulation: All assessment and education complete, DC therapy  Plan       Co-evaluation                 AM-PAC OT 6 Clicks Daily Activity     Outcome Measure   Help from another person eating meals?: None Help from another person taking care of personal grooming?: None Help from another person toileting, which includes using toliet, bedpan, or urinal?: None (per pt report with S.O agreesing) Help from another person bathing (including washing, rinsing, drying)?: A Little Help from another person to put on and taking off regular upper body clothing?: None Help from another person to put on and taking off regular lower body clothing?: A Little 6 Click Score: 22    End of Session    OT Visit Diagnosis: Muscle weakness (generalized) (M62.81)   Activity Tolerance Treatment limited secondary to agitation   Patient Left in bed;with call bell/phone within reach;with family/visitor present;with bed alarm set   Nurse Communication Other (comment) (Pt response to OT. OT signing off for acute services.)        Time: 8882-8857 OT Time Calculation (min): 25 min  Charges: OT General Charges $OT Visit: 1 Visit OT Treatments $Self Care/Home Management : 23-37 mins  Delon, OT Acute Rehab Services Office: [phone] 02/24/2023   Delon Falter 02/24/2023, 11:55 AM

## 2023-02-24 NOTE — Evaluation (Signed)
 Clinical/Bedside Swallow Evaluation Patient Details  Name: MAURISSA AMBROSE MRN: [id number] Date of Birth: 01-16-1936  Today's Date: 02/24/2023 Time: SLP Start Time (ACUTE ONLY): 1000 SLP Stop Time (ACUTE ONLY): 1040 SLP Time Calculation (min) (ACUTE ONLY): 40 min  Past Medical History:  Past Medical History:  Diagnosis Date   Age-related macular degeneration, dry, both eyes    Alcoholism (HCC)    Anemia    Anxiety    Cholelithiasis    Endometriosis    Goiter    Herpes simplex    Hyperlipidemia    Hypertension    Loss of weight    Nontoxic multinodular goiter    Other abnormal blood chemistry    PUD (peptic ulcer disease)    Renal cyst    Sciatica    Scoliosis (and kyphoscoliosis), idiopathic    Unspecified disorder of kidney and ureter    Urinary frequency    Past Surgical History:  Past Surgical History:  Procedure Laterality Date   ABDOMINAL HYSTERECTOMY  1971   CATARACT EXTRACTION W/ INTRAOCULAR LENS  IMPLANT, BILATERAL Bilateral 2016   Dr. Oneil Platts   IRRIGATION AND DEBRIDEMENT ABSCESS Left 02/23/2017   Procedure: IRRIGATION AND DEBRIDEMENT ABSCESS;  Surgeon: Vernetta Lonni GRADE, MD;  Location: [facility] OR;  Service: Orthopedics;  Laterality: Left;   REPAIR OF PERFORATED ULCER  1973 &1975   Dr Jannetta   STOMACH SURGERY  [phone]   removed 1/2 stomach; had a total of 5 ORs from 1973-1980; all related to perforated ulcer   TONSILLECTOMY     HPI:  88yo female admitted 02/22/23 with abnormal renal function, malaise, fatigue, decreased PO intake x5-6 days due to AKI and UTI. PMH: alcoholism, cholelithiasis, HLD, HTN, macular degeneration, anxiety. CXR = no acute dz, CT = L>R BLL consolidation suspicious for aspiration.    Assessment / Plan / Recommendation  Clinical Impression  Pt presents with increased aspiration risk, warranting completion of instrumental study.   Pt reports significant xerostomia. She has missing molars. CN exam unremarkable. Pt accepted trials of ice  chips, thin liquid, puree, and solid textures. No overt s/s aspiration following individual sips of thin liquid via cup/straw, puree or solid trials, however, pt was unable to consume 3oz water  without stopping, and exhibited a slight cough when she stopped drinking. Extended oral prep noted with solid, which pt attributed to missing dentition and dry mouth. Pt exhibited mild belching after PO trials.   Given aspiration risk and CXR results, recommend proceeding with instrumental study (MBS) to objectively assess swallow physiology, function and safety. Will schedule for tomorrow per pt request.  SLP Visit Diagnosis: Dysphagia, unspecified (R13.10)    Aspiration Risk  Mild aspiration risk    Diet Recommendation Regular;Thin liquid    Liquid Administration via: Cup;Straw Medication Administration: Whole meds with liquid Supervision: Patient able to self feed Compensations: Minimize environmental distractions;Slow rate;Small sips/bites Postural Changes: Seated upright at 90 degrees;Remain upright for at least 30 minutes after po intake    Other  Recommendations Oral Care Recommendations: Oral care BID    Recommendations for follow up therapy are one component of a multi-disciplinary discharge planning process, led by the attending physician.  Recommendations may be updated based on patient status, additional functional criteria and insurance authorization.  Follow up Recommendations Follow physician's recommendations for discharge plan and follow up therapies      Assistance Recommended at Discharge  TBD  Functional Status Assessment Patient has had a recent decline in their functional status and demonstrates the ability  to make significant improvements in function in a reasonable and predictable amount of time.   Frequency and Duration   Pending MBS results/recommendations        Prognosis Prognosis for improved oropharyngeal function: Good      Swallow Study   General Date of  Onset: 02/22/23 HPI: 88yo female admitted 02/22/23 with abnormal renal function, malaise, fatigue, decreased PO intake x5-6 days due to AKI and UTI. PMH: alcoholism, cholelithiasis, HLD, HTN, macular degeneration, anxiety. CXR = no acute dz, CT = L>R BLL consolidation suspicious for aspiration. Type of Study: Bedside Swallow Evaluation Previous Swallow Assessment: none Diet Prior to this Study: Regular;Thin liquids (Level 0) Temperature Spikes Noted: No Respiratory Status: Nasal cannula History of Recent Intubation: No Behavior/Cognition: Alert;Cooperative Oral Cavity Assessment: Within Functional Limits Oral Care Completed by SLP: Yes Oral Cavity - Dentition: Adequate natural dentition;Missing dentition Vision: Functional for self-feeding Self-Feeding Abilities: Able to feed self Patient Positioning: Upright in bed Baseline Vocal Quality: Normal Volitional Cough: Strong Volitional Swallow: Able to elicit    Oral/Motor/Sensory Function Overall Oral Motor/Sensory Function: Within functional limits   Ice Chips Ice chips: Within functional limits Presentation: Spoon   Thin Liquid Presentation: Cup;Straw;Self Fed Other Comments: no overt s/s aspiration with individual sips via cup or straw, but did not pass 3oz water  challenge - unable to consume all 3oz without stopping, and exhibited slight cough afterward    Nectar Thick Nectar Thick Liquid: Not tested   Honey Thick Honey Thick Liquid: Not tested   Puree Puree: Within functional limits Presentation: Spoon;Self Fed   Solid     Solid: Impaired Presentation: Self Fed Oral Phase Impairments: Impaired mastication Oral Phase Functional Implications: Impaired mastication;Prolonged oral transit Other Comments: pt reports eating soft solids due to missing molars and dry mouth     Jshon Ibe B. Dory, MSP, CCC-SLP Speech Language Pathologist  Dory Caprice Daring 02/24/2023,10:51 AM

## 2023-02-24 NOTE — TOC Initial Note (Addendum)
 Transition of Care [facility]) - Initial/Assessment Note    Patient Details  Name: [name] MRN: [id number] Date of Birth: 1936-01-30  [facility] CM/SW Contact:    Hoy DELENA Bigness, LCSW Phone Number: 02/24/2023, 3:58 PM  Clinical Narrative:                 Met with pt at bedside to discuss discharge plans. Pt agreeable to having HH services arranged. She reports having HH in the past but, is unsure of which agency this was with and denies having a preference for an agency. HHPT/OT arranged with [facility]. HH orders will need to be placed prior to discharge. Pt recommended for RW and BSC. Pt declines having DME ordered and shares that she and her s/o plan on obtaining these items used. TOC will continue to follow for discharge needs.   Expected Discharge Plan: Home w Home Health Services Barriers to Discharge: Continued Medical Work up   Patient Goals and CMS Choice Patient states their goals for this hospitalization and ongoing recovery are:: To return home CMS Medicare.gov Compare Post Acute Care list provided to:: Patient Choice offered to / list presented to : Patient [place] ownership interest in [facility].provided to::  (NA)    Expected Discharge Plan and Services In-house Referral: Clinical Social Work Discharge Planning Services: NA Post Acute Care Choice: Home Health Living arrangements for the past 2 months: Apartment                 DME Arranged: N/A DME Agency: NA       HH Arranged: PT, OT HH Agency: [facility] Date HH Agency Contacted: 02/24/23 Time HH Agency Contacted: [id number] Representative spoke with at [facility] Agency: Jon  Prior Living Arrangements/Services Living arrangements for the past 2 months: Apartment Lives with:: Significant Other Patient language and need for interpreter reviewed:: Yes Do you feel safe going back to the place where you live?: Yes      Need for Family Participation in Patient Care: No (Comment) Care  giver support system in place?: No (comment)   Criminal Activity/Legal Involvement Pertinent to Current Situation/Hospitalization: No - Comment as needed  Activities of Daily Living   ADL Screening (condition at time of admission) Independently performs ADLs?: Yes (appropriate for developmental age) Is the patient deaf or have difficulty hearing?: No Does the patient have difficulty seeing, even when wearing glasses/contacts?: No Does the patient have difficulty concentrating, remembering, or making decisions?: No  Permission Sought/Granted Permission sought to share information with : Facility Industrial/product Designer granted to share information with : Yes, Verbal Permission Granted     Permission granted to share info w AGENCY: [facility]        Emotional Assessment Appearance:: Appears stated age Attitude/Demeanor/Rapport: Engaged Affect (typically observed): Pleasant Orientation: : Oriented to Self, Oriented to Place, Oriented to  Time, Oriented to Situation Alcohol  / Substance Use: Not Applicable Psych Involvement: No (comment)  Admission diagnosis:  Weakness [R53.1] Acute cystitis without hematuria [N30.00] AKI (acute kidney injury) (HCC) [N17.9] Patient Active Problem List   Diagnosis Date Noted   Hyponatremia 02/23/2023   Bacteremia 02/23/2023   AKI (acute kidney injury) (HCC) 02/22/2023   Volume depletion 02/22/2023   Generalized weakness 02/22/2023   Aortic atherosclerosis (HCC) 08/03/2022   UTI (urinary tract infection) 06/25/2022   Skin irritation 06/23/2022   Pain in left leg 08/08/2021   Other constipation 07/31/2021   Pharyngitis 11/05/2020   Herpes simplex 07/04/2020  Overactive bladder 01/18/2020   Stress incontinence 01/18/2020   Stage 3b chronic kidney disease (HCC) 01/18/2020   Anemia 12/08/2018   Abdominal pain 11/24/2018   Itchy scalp 01/10/2018   Iron  deficiency anemia secondary to inadequate dietary iron  intake 01/10/2018   Adhesive  capsulitis of left shoulder 07/05/2017   Streptococcal bacteremia 02/24/2017   Abscess of left shoulder 02/24/2017   Anxiety 02/24/2017   Septic bursitis 02/23/2017   DM2 (diabetes mellitus, type 2) (HCC) 02/23/2017   Insomnia 07/01/2015   Genital herpes simplex type 2 11/03/2014   Hyperglycemia 05/23/2012   Multinodular goiter 05/23/2012   Essential hypertension, benign 05/23/2012   PCP:  Caro Harlene POUR, NP Pharmacy:   [facility] [phone] - [place], [place] - [address] AT [facility] [address] [place] [address] Phone: [phone] Fax: [phone]  [id number] - [place], [place] - [address] AT [facility] OF [address] [place] [address] Phone: [phone] Fax: [phone]     Social Drivers of Health (SDOH) Social History: SDOH Screenings   Food Insecurity: No Food Insecurity (02/23/2023)  Housing: Low Risk  (02/23/2023)  Transportation Needs: No Transportation Needs (02/23/2023)  Utilities: Not At Risk (02/23/2023)  Alcohol  Screen: Low Risk  (01/10/2018)  Depression (PHQ2-9): Low Risk  (02/22/2023)  Financial Resource Strain: Low Risk  (08/25/2017)  Physical Activity: Insufficiently Active (08/25/2017)  Social Connections: Socially Integrated (02/23/2023)  Stress: No Stress Concern Present (08/25/2017)  Tobacco Use: Medium Risk (02/22/2023)   SDOH Interventions:     Readmission Risk Interventions    02/24/2023    3:56 PM  Readmission Risk Prevention Plan  Post Dischage Appt Complete  Medication Screening Complete  Transportation Screening Complete

## 2023-02-25 ENCOUNTER — Inpatient Hospital Stay (HOSPITAL_COMMUNITY): Payer: 59

## 2023-02-25 DIAGNOSIS — N179 Acute kidney failure, unspecified: Secondary | ICD-10-CM | POA: Diagnosis not present

## 2023-02-25 LAB — CBC WITH DIFFERENTIAL/PLATELET
Abs Immature Granulocytes: 0.68 10*3/uL — ABNORMAL HIGH (ref 0.00–0.07)
Basophils Absolute: 0.1 10*3/uL (ref 0.0–0.1)
Basophils Relative: 0 %
Eosinophils Absolute: 0.3 10*3/uL (ref 0.0–0.5)
Eosinophils Relative: 2 %
HCT: 25.5 % — ABNORMAL LOW (ref 36.0–46.0)
Hemoglobin: 8 g/dL — ABNORMAL LOW (ref 12.0–15.0)
Immature Granulocytes: 4 %
Lymphocytes Relative: 7 %
Lymphs Abs: 1.2 10*3/uL (ref 0.7–4.0)
MCH: 26 pg (ref 26.0–34.0)
MCHC: 31.4 g/dL (ref 30.0–36.0)
MCV: 82.8 fL (ref 80.0–100.0)
Monocytes Absolute: 0.7 10*3/uL (ref 0.1–1.0)
Monocytes Relative: 4 %
Neutro Abs: 14.6 10*3/uL — ABNORMAL HIGH (ref 1.7–7.7)
Neutrophils Relative %: 83 %
Platelets: 391 10*3/uL (ref 150–400)
RBC: 3.08 MIL/uL — ABNORMAL LOW (ref 3.87–5.11)
RDW: 15.4 % (ref 11.5–15.5)
WBC: 17.5 10*3/uL — ABNORMAL HIGH (ref 4.0–10.5)
nRBC: 0 % (ref 0.0–0.2)

## 2023-02-25 LAB — CREATININE, URINE, RANDOM: Creatinine, Urine: 44 mg/dL

## 2023-02-25 LAB — COMPREHENSIVE METABOLIC PANEL
ALT: 11 U/L (ref 0–44)
AST: 15 U/L (ref 15–41)
Albumin: 2 g/dL — ABNORMAL LOW (ref 3.5–5.0)
Alkaline Phosphatase: 89 U/L (ref 38–126)
Anion gap: 11 (ref 5–15)
BUN: 41 mg/dL — ABNORMAL HIGH (ref 8–23)
CO2: 17 mmol/L — ABNORMAL LOW (ref 22–32)
Calcium: 7.9 mg/dL — ABNORMAL LOW (ref 8.9–10.3)
Chloride: 109 mmol/L (ref 98–111)
Creatinine, Ser: 2.45 mg/dL — ABNORMAL HIGH (ref 0.44–1.00)
GFR, Estimated: 19 mL/min — ABNORMAL LOW (ref >60)
Glucose, Bld: 113 mg/dL — ABNORMAL HIGH (ref 70–99)
Potassium: 3.8 mmol/L (ref 3.5–5.1)
Sodium: 137 mmol/L (ref 135–145)
Total Bilirubin: 0.4 mg/dL (ref 0.0–1.2)
Total Protein: 5.2 g/dL — ABNORMAL LOW (ref 6.5–8.1)

## 2023-02-25 LAB — FERRITIN: Ferritin: 52 ng/mL (ref 11–307)

## 2023-02-25 LAB — CULTURE, BLOOD (ROUTINE X 2)

## 2023-02-25 LAB — SODIUM, URINE, RANDOM: Sodium, Ur: 75 mmol/L

## 2023-02-25 LAB — URINE CULTURE: Culture: 80000 — AB

## 2023-02-25 LAB — IRON AND TIBC
Iron: 23 ug/dL — ABNORMAL LOW (ref 28–170)
Saturation Ratios: 9 % — ABNORMAL LOW (ref 10.4–31.8)
TIBC: 255 ug/dL (ref 250–450)
UIBC: 232 ug/dL

## 2023-02-25 MED ORDER — SODIUM CHLORIDE 0.9 % IV BOLUS
2000.0000 mL | Freq: Once | INTRAVENOUS | Status: AC
  Administered 2023-02-25: 2000 mL via INTRAVENOUS

## 2023-02-25 MED ORDER — STERILE WATER FOR INJECTION IV SOLN
INTRAVENOUS | Status: DC
  Filled 2023-02-25: qty 1000
  Filled 2023-02-25 (×2): qty 150

## 2023-02-25 NOTE — Progress Notes (Addendum)
 Physical Therapy Treatment Patient Details Name: [name] MRN: [id number] DOB: September 22, 1935 Today's Date: 02/25/2023   History of Present Illness 88 yo female presented to ED on 02/22/2023, pt directed to ED via PCP secondary to abn lab findings. Pt reports malaise and poor PO intake with constipation. Pt found to have AKI, abn UA and abn abdominal CT with lymphadenopathy and thickening of the gallbladder and liver concerning for possible malignancy. Pt also found to have acute hypoxia likely due to aspiration PNA with pt currently requiring supplemental O2. Pt PMH includes but is not limited to: macular degeneration, substance abuse, anemia, anxiety, goiter, herpes, HLD, HTN, and ulcer.    PT Comments  Pt seen for PT tx with pt agreeable. Pt is able to ambulate increased distances with RW & CGA on this date but does experience 1 LOB requiring assistance to correct. Pt performs toileting tasks & stands at sink with supervision. Will continue to follow pt acutely to address balance, endurance, strengthening, to progress gait with LRAD & reduce fall risk.    If plan is discharge home, recommend the following: A little help with walking and/or transfers;A little help with bathing/dressing/bathroom;Assistance with cooking/housework;Assist for transportation   Can travel by private vehicle        Equipment Recommendations  Rolling walker (2 wheels)    Recommendations for Other Services       Precautions / Restrictions Precautions Precautions: Fall Restrictions Weight Bearing Restrictions Per Provider Order: No     Mobility  Bed Mobility Overal bed mobility: Needs Assistance Bed Mobility: Supine to Sit     Supine to sit: Supervision, Used rails, HOB elevated          Transfers Overall transfer level: Needs assistance Equipment used: Rolling walker (2 wheels) Transfers: Sit to/from Stand Sit to Stand: Contact guard assist           General transfer comment: ongoing cuing re:  hand placement during STS, cuing to use grab bar to pull up from toilet    Ambulation/Gait Ambulation/Gait assistance: Contact guard assist Gait Distance (Feet): 100 Feet (+ 20 ft + 20 ft) Assistive device: Rolling walker (2 wheels) Gait Pattern/deviations: Decreased step length - right, Decreased step length - left, Decreased stride length Gait velocity: decreased     General Gait Details: 1 LOB with min assist to correct   Stairs             Wheelchair Mobility     Tilt Bed    Modified Rankin (Stroke Patients Only)       Balance Overall balance assessment: Needs assistance Sitting-balance support: Feet supported Sitting balance-Leahy Scale: Good Sitting balance - Comments: able to perform peri hygiene while sitting on toilet without LOB   Standing balance support: No upper extremity supported, During functional activity Standing balance-Leahy Scale: Fair Standing balance comment: static standing at sink for grooming with close supervision                            Cognition Arousal: Alert Behavior During Therapy:  (easily irritated) Overall Cognitive Status: Within Functional Limits for tasks assessed                                 General Comments: Overwhelmed        Exercises      General Comments General comments (skin integrity, edema, etc.): Pt on  1L/min via nasal cannula with SpO2 >90% throughout. Pt with continent void & BM on toilet, performing peri hygiene independently. Pt performed grooming tasks standing at sink without assistance.      Pertinent Vitals/Pain Pain Assessment Pain Assessment: No/denies pain    Home Living                          Prior Function            PT Goals (current goals can now be found in the care plan section) Acute Rehab PT Goals Patient Stated Goal: to be able to go home and not have the O2 readings jump all over the place PT Goal Formulation: With patient Time  For Goal Achievement: 03/09/23 Potential to Achieve Goals: Good Progress towards PT goals: Progressing toward goals    Frequency    Min 1X/week      PT Plan      Co-evaluation              AM-PAC PT 6 Clicks Mobility   Outcome Measure  Help needed turning from your back to your side while in a flat bed without using bedrails?: None Help needed moving from lying on your back to sitting on the side of a flat bed without using bedrails?: A Little Help needed moving to and from a bed to a chair (including a wheelchair)?: A Little Help needed standing up from a chair using your arms (e.g., wheelchair or bedside chair)?: A Little Help needed to walk in hospital room?: A Little Help needed climbing 3-5 steps with a railing? : A Little 6 Click Score: 19    End of Session Equipment Utilized During Treatment: Oxygen Activity Tolerance: Patient tolerated treatment well Patient left: in chair;with call bell/phone within reach Nurse Communication: Mobility status (need for chair alarm under pt) PT Visit Diagnosis: Unsteadiness on feet (R26.81);Other abnormalities of gait and mobility (R26.89);Muscle weakness (generalized) (M62.81);Difficulty in walking, not elsewhere classified (R26.2)     Time: 8472-8447 PT Time Calculation (min) (ACUTE ONLY): 25 min  Charges:    $Therapeutic Activity: 23-37 mins PT General Charges $$ ACUTE PT VISIT: 1 Visit                     [name], PT, DPT 02/25/23, 4:02 PM   [name] 02/25/2023, 4:02 PM

## 2023-02-25 NOTE — Consult Note (Signed)
 Modified Barium Swallow Study  Patient Details  Name: [name] MRN: [id number] Date of Birth: 1935-06-04  Today's Date: 02/25/2023  Modified Barium Swallow completed.  Full report located under Chart Review in the Imaging Section.  History of Present Illness 88yo female admitted 02/22/23 with abnormal renal function, malaise, fatigue, decreased PO intake x5-6 days due to AKI and UTI. PMH: alcoholism, cholelithiasis, HLD, HTN, macular degeneration, anxiety. CXR = no acute dz, CT = L>R BLL consolidation suspicious for aspiration; SLP recommended Reg/thin diet during BSE with MBS completion to r/o aspiration.   Clinical Impression Pt demonstrated mild oropharyngeal dysphagia c/b trace pharyngeal residue on tongue base and valleculae d/t decreased tongue base retraction which cleared with subsequent swallow.  This may be d/t xerostomia as pt did exhibit a dry oral cavity prior to study and stated a need for frequent water  most days.  Oral preparation adequate, brisk swallow response noted and no aspiration/penetration observed throughout study.  Slight trace residue in pyriform sinuses may suggest CP bar dysfunction intermittently and could contribute to esophageal phase of swallow, but compensatory strategy of repetitive swallow cleared all material from pyriform sinuses post swallow.  Pt is deconditioned (with new oxygen requirement) which may also impact cumulative oropharyngeal swallow.  Pt did exhibit slight anterior loss of thin with decreased awareness post study, but this only occurred after pill/thin liquid trial.  Pt denied dysphagia prior to study.  Discussed results with patient and she is willing to utilize compensatory strategy of repetitive swallows intermittently during meals.  Recommend continue current diet of Regular/thin liquids with compensatory strategies to assist with xerostomia/pharyngeal residue during po intake. Thank you for this consult. Factors that may increase risk of adverse  event in presence of aspiration Noe & Lianne 2021): Poor general health and/or compromised immunity;Reduced cognitive function;Frail or deconditioned;Weak cough  Swallow Evaluation Recommendations Recommendations: PO diet PO Diet Recommendation: Regular;Thin liquids (Level 0) Liquid Administration via: Cup;Straw Medication Administration: Whole meds with liquid (larger pills in cohesive material (ie: pudding, yogurt) prn) Supervision: Patient able to self-feed;Intermittent supervision/cueing for swallowing strategies Swallowing strategies  : Slow rate;Small bites/sips;Multiple dry swallows after each bite/sip;Follow solids with liquids Postural changes: Position pt fully upright for meals;Stay upright 30-60 min after meals Oral care recommendations: Oral care BID (2x/day) Recommended consults:  (May consider an esophageal assessment if symptoms continue related to GERD/PNA)      Pat Katharyn Schauer,M.S., CCC-SLP 02/25/2023,3:09 PM

## 2023-02-25 NOTE — Plan of Care (Signed)

## 2023-02-25 NOTE — Consult Note (Signed)
 Renal Service Consult Note [facility]  [name] 02/25/2023 Kerri [name], MD Requesting Physician: Dr. Leesa  Reason for Consult: Renal faliure HPI: The patient is a 88 y.o. year-old w/ PMH as below who presented to ED 1/06 w/ concerns from PCP of AKI and UTI. Symptoms were going on for 1 week. H/o etohism, HL, HTN. Pt presented w/ malaise, fatigue and poor po intake for the last 5- 6 days. Labs and urine tests done by PCP showed creat 2.4 (b/l 1.5). in ED pt afeb and pt c/o gen'd weakness and fatigue and tiredness. Having +frequency or urination and walking difficulty. Usually okay using walker and a cane, but not lately. Also chills and shaking at home. In ED WBC was 32K, Hb 9.5.  LA wnl. Creat 2.6, Na 130.  UA ++wbcs, many bact. Pt started on IV abx and admitted. We are asked to see for renal faliure.   Pt got 2 L bolus in ED and IVFs at 75 cc/hr thereafter.  BP's on admission were normal , like 110- 125/ 65-75.  No sig hypotension here.  I/O here are 2.4 L in and 300 cc UOP = +2.1 L No IV contrast, no acei /ARB Getting bystolic  5 mg daily, no other BP lowering meds IV rocephin  since admission on 1/06 Pt seen in room. No c/o's today. Denies hx of kidney problems.    ROS - denies CP, no joint pain, no HA, no blurry vision, no rash, no diarrhea, no nausea/ vomiting, no dysuria, no difficulty voiding   Past Medical History  Past Medical History:  Diagnosis Date   Age-related macular degeneration, dry, both eyes    Alcoholism (HCC)    Anemia    Anxiety    Cholelithiasis    Endometriosis    Goiter    Herpes simplex    Hyperlipidemia    Hypertension    Loss of weight    Nontoxic multinodular goiter    Other abnormal blood chemistry    PUD (peptic ulcer disease)    Renal cyst    Sciatica    Scoliosis (and kyphoscoliosis), idiopathic    Unspecified disorder of kidney and ureter    Urinary frequency    Past Surgical History  Past Surgical History:   Procedure Laterality Date   ABDOMINAL HYSTERECTOMY  1971   CATARACT EXTRACTION W/ INTRAOCULAR LENS  IMPLANT, BILATERAL Bilateral 2016   Dr. Oneil Platts   IRRIGATION AND DEBRIDEMENT ABSCESS Left 02/23/2017   Procedure: IRRIGATION AND DEBRIDEMENT ABSCESS;  Surgeon: Vernetta Lonni GRADE, MD;  Location: [facility] OR;  Service: Orthopedics;  Laterality: Left;   REPAIR OF PERFORATED ULCER  1973 &1975   Dr Jannetta   STOMACH SURGERY  [phone]   removed 1/2 stomach; had a total of 5 ORs from 1973-1980; all related to perforated ulcer   TONSILLECTOMY     Family History  Family History  Problem Relation Age of Onset   Obesity Mother    Neuropathy Neg Hx    Colon cancer Neg Hx    Esophageal cancer Neg Hx    Pancreatic cancer Neg Hx    Stomach cancer Neg Hx    Social History  reports that she quit smoking about 29 years ago. Her smoking use included cigarettes. She started smoking about 69 years ago. She has a 40 pack-year smoking history. She has never used smokeless tobacco. She reports that she does not drink alcohol  and does not use drugs. Allergies  Allergies  Allergen Reactions  Cymbalta  [Duloxetine  Hcl] Diarrhea   Home medications Prior to Admission medications   Medication Sig Start Date End Date Taking? Authorizing Provider  amLODipine -valsartan  (EXFORGE ) 10-320 MG tablet TAKE 1 TABLET BY MOUTH EVERY DAY Patient taking differently: Take 1 tablet by mouth in the morning. 01/21/23  Yes Caro Harlene POUR, NP  ARTIFICIAL TEARS PF 0.1-0.3 % SOLN Place 1 drop into both eyes 4 (four) times daily as needed (for dryness or irritation).   Yes [provider]  conjugated estrogens (PREMARIN) vaginal cream Place 1 Application vaginally See admin instructions. Place a pea-sized amount vaginally once a day   Yes [provider]  CRANBERRY PO Take 2 capsules by mouth daily.   Yes [provider]  gabapentin  (NEURONTIN ) 300 MG capsule TAKE 1 CAPSULE(300 MG) BY MOUTH AT  BEDTIME Patient taking differently: Take 300 mg by mouth at bedtime. 09/07/22  Yes Caro Harlene POUR, NP  GEMTESA  75 MG TABS Take 1 tablet (75 mg total) by mouth daily. 08/31/22  Yes Caro Harlene POUR, NP  liver oil-zinc  oxide (DESITIN) 40 % ointment Apply 1 Application topically as needed for irritation. 11/17/22  Yes [provider]  LORazepam  (ATIVAN ) 0.5 MG tablet TAKE 1 TABLET(0.5 MG) BY MOUTH TWICE DAILY- needs follow up prior to additional refills Patient taking differently: Take 0.5 mg by mouth in the morning and at bedtime. 02/01/23  Yes Caro Harlene POUR, NP  Multiple Vitamins-Minerals (PRESERVISION AREDS) CAPS Take 1 capsule by mouth 2 (two) times daily.   Yes [provider]  nebivolol  (BYSTOLIC ) 10 MG tablet TAKE 1/2 TABLET(5 MG) BY MOUTH DAILY Patient taking differently: Take 5 mg by mouth in the morning. 02/15/23  Yes Caro Harlene POUR, NP  omeprazole  (PRILOSEC) 20 MG capsule TAKE 1 CAPSULE(20 MG) BY MOUTH TWICE DAILY BEFORE A MEAL Patient taking differently: Take 20 mg by mouth 2 (two) times daily before a meal. 09/14/22  Yes Caro Harlene POUR, NP  triamcinolone  cream (KENALOG ) 0.1 % Apply 1 Application topically 2 (two) times daily as needed (for itching).   Yes [provider]  psyllium (METAMUCIL SMOOTH TEXTURE) 58.6 % powder Take 1 packet by mouth daily. Patient not taking: Reported on 02/22/2023 11/23/22   Medina-Vargas, Jereld C, NP     Vitals:   02/24/23 2144 02/25/23 0500 02/25/23 0610 02/25/23 1157  BP: 124/66  139/81 118/65  Pulse: 81  87 75  Resp: 18  18 18   Temp: 98.4 F (36.9 C)  99.2 F (37.3 C) 99.4 F (37.4 C)  TempSrc: Oral  Oral   SpO2: 95%  95% 98%  Weight:  49.4 kg    Height:       Exam Gen alert, no distress No rash, cyanosis or gangrene Sclera anicteric, throat clear  No jvd or bruits Chest clear bilat to bases, no rales/ wheezing RRR no MRG Abd soft ntnd no mass or ascites +bs GU defer MS no joint effusions or  deformity Ext mild bilat 1+ ankle edema Neuro is alert, Ox 3 , nf     Date   Creat  eGFR______ 7984- 2019  1.06- 1.33 2020   1.14- 1.53 2021- 2022  1.26- 1.52 34- 42 ml/min 2023   1.44- 1.53 33- 36 ml/min Jun 2024  1.52  34 ml/min 02/19/23  2.49  18    02/21/33  2.66  17 1/07   2.62 1/08   2.20  21 1/09   2.45  19    UA 1/06 - cloudy, large LE,  prot 100, 1.0911, many bact, rbc 21-50, wbc > 50   CT renal stone 1/06 - Urinary Tract: No suspicious renal mass on this noncontrast enhanced examination, calculi or hydronephrosis. No focal bladder wall thickening.   BP 120/ 65, HR 75  RR 19, afeb 99.4       UCx + EColi on 1/06     BCx + EColi on 1/06 as well  Assessment/ Plan: AKI on CKD 3b - b/l creat 1..4- 1.6 from 2023 and June 2024, eGFR 33- 36 ml/min. Creat here was 2.4 on admission w/ peak of 2.6 and low of 2.2 since admission but still up at 2.45 today. This in setting of an acute illness w/ several days of poor po intake, also has UTI and bacteremia. There really wasn't any significant hypotension, and no contrast. CT scan is w/o hydro, no signs of renal inflammation (stranding) as can be seen w/ acute pyelonephritis. No contrast or other renal toxic medications. Suspect AKI is due to acute infection and/or vol depletion still not fully replaced. Plan is for 2 L bolus (over 6 hrs) then resume IVFs at 65 cc/hr. Get urine lytes. Will follow.  EColi urosepsis - on IV abx H/o HTN - getting bystolic , will stop this for now as BP's have not been high here      Myer Fret  MD [facility] 02/25/2023, 5:23 PM  Recent Labs  Lab 02/22/23 1435 02/22/23 1927 02/24/23 0520 02/25/23 0806  HGB  --    < > 7.8* 8.0*  ALBUMIN 2.2*  --   --  2.0*  CALCIUM 7.6*   < > 7.5* 7.9*  CREATININE 2.66*   < > 2.20* 2.45*  K 3.7   < > 3.7 3.8   < > = values in this interval not displayed.   Inpatient medications:  gabapentin   200 mg Oral QHS   heparin   5,000 Units Subcutaneous Q8H   mirabegron  ER  25 mg  Oral Daily   nebivolol   5 mg Oral Daily   pantoprazole   40 mg Oral Daily   sodium chloride  flush  3 mL Intravenous Q12H    cefTRIAXone  (ROCEPHIN )  IV 2 g (02/25/23 1558)   acetaminophen  **OR** acetaminophen , hydrALAZINE , liver oil-zinc  oxide, LORazepam , menthol -cetylpyridinium, ondansetron  **OR** ondansetron  (ZOFRAN ) IV, polyethylene glycol, polyvinyl alcohol , sodium chloride  flush, triamcinolone  cream

## 2023-02-25 NOTE — Progress Notes (Addendum)
 PROGRESS NOTE    [name]  FMW:[id number] DOB: 1935-08-26 DOA: 02/22/2023 PCP: Caro Harlene POUR, NP  Chief Complaint  Patient presents with   Urinary Tract Infection   AKI    Hospital Course:  [name] is 88 y.o. female with a of alcohol  abuse presenting with fatigue and decreased p.o. intake.  She saw her PCP in office and was found to have AKI and thus recommended for ED evaluation.  In the ED she was found of hyponatremia 130, AKI with creatinine 2.6, UTI, leukocytosis at 32K, and CT scan which revealed liver and gallbladder abnormalities as well as lymphadenopathy concerning for malignancy.  Subjective: No acute events overnight. Pt awaiting barium swallow this AM. No pain.   Objective: Vitals:   02/24/23 1819 02/24/23 2144 02/25/23 0500 02/25/23 0610  BP:  124/66  139/81  Pulse: 78 81  87  Resp:  18  18  Temp:  98.4 F (36.9 C)  99.2 F (37.3 C)  TempSrc:  Oral  Oral  SpO2: 97% 95%  95%  Weight:   49.4 kg   Height:        Intake/Output Summary (Last 24 hours) at 02/25/2023 0746 Last data filed at 02/24/2023 2100 Gross per 24 hour  Intake 186.67 ml  Output --  Net 186.67 ml   Filed Weights   02/22/23 1135 02/24/23 0241 02/25/23 0500  Weight: 46.7 kg 49.5 kg 49.4 kg    Examination: General exam: Appears calm and comfortable, NAD, then Respiratory system: No work of breathing, symmetric chest wall expansion Cardiovascular system: S1 & S2 heard, RRR.  Gastrointestinal system: Abdomen is nondistended, soft and nontender.  Neuro: Alert and oriented. No focal neurological deficits. Extremities: Symmetric, expected ROM Skin: No rashes, lesions Psychiatry: Demonstrates appropriate judgement and insight. Mood & affect appropriate for situation.   Assessment & Plan:  Principal Problem:   AKI (acute kidney injury) (HCC) Active Problems:   Essential hypertension, benign   UTI (urinary tract infection)   Aortic atherosclerosis (HCC)   Volume depletion    Generalized weakness   Hyponatremia   Bacteremia    AKI superimposed on CKD - Creatinine 1 year ago 1.5 - Creatinine 2.6 on arrival, minimal improvement - Presumed prerenal injury secondary to volume depletion given poor p.o. intake on arrival - Renal US  ordered - Continue strict I's and O's, output appears to be slowing.  Will consult with nephrology. - Avoid nephrotoxic meds, renally dose when needed  E. coli bacteremia - Suspect urinary source, urine cultures now positive as well - Also possibility for abdominal source given concern for possible gallbladder malignancy and potential liver lesion - On ceftriaxone , will continue this.  Appears to be pansensitive  Leukocytosis - WBC 32 on arrival, downtrending now.  - Secondary to bacteremia, antibiotics as above - Could also be complicated by malignancy given abnormal CT findings.  Abnormal gallbladder on CT imaging Liver lesion on CT imaging New periportal and retroaortic lymphadenopathy concerning for metastatic disease 1.3 cm left adrenal nodule - Initially seen on CT without con.  Ultrasound with cholelithiasis but cannot rule out mass - AKI is improving some but ultimately needs MRI with contrast to better evaluate these lesions.  Continue to monitor creatinine improvement  Hyponatremia, resolved now - 128 Nadir - Likely secondary to poor p.o. intake - Continue IV fluids - Continue CMP trending  Bilateral lower lobe consolidation on CT, concerning for aspiration Acute hypoxic respiratory failure - Does not require home O2 at baseline  currently necessitating 2 L O2, will continue to wean as tolerated - Currently on ceftriaxone  as above - SLP for barium swallow today, pending results  Normocytic anemia - Hemoglobin 9.5 on arrival, downtrending now though I suspect this is secondary to hemodilution given she has been on IV fluids - Continue to trend - Iron  studies confirm iron  deficiency as well.  Will add ferrous  citrate supplementation  Aortic atherosclerosis - May benefit from aspirin and statin. Will not start asa now given current Hgb downtrend.  Fatigue Generalized weakness Advanced Age - PT/OT as tolerated - Likely acute worsening due to acute illness    DVT prophylaxis: Heparin    Code Status: Limited: Do not attempt resuscitation (DNR) -DNR-LIMITED -Do Not Intubate/DNI  Family Communication: No family at bedside, discussed directly with patient.  Attempted to reach the patient's boyfriend and friend, neither answered phone Disposition:  Status is: Inpatient, ongoing work up      Antimicrobials:  Anti-infectives (From admission, onward)    Start     Dose/Rate Route Frequency Ordered Stop   02/23/23 1500  cefTRIAXone  (ROCEPHIN ) 2 g in sodium chloride  0.9 % 100 mL IVPB        2 g 200 mL/hr over 30 Minutes Intravenous Every 24 hours 02/23/23 0524     02/23/23 1400  cefTRIAXone  (ROCEPHIN ) 1 g in sodium chloride  0.9 % 100 mL IVPB  Status:  Discontinued        1 g 200 mL/hr over 30 Minutes Intravenous Every 24 hours 02/22/23 1820 02/23/23 0524   02/22/23 1400  cefTRIAXone  (ROCEPHIN ) 2 g in sodium chloride  0.9 % 100 mL IVPB        2 g 200 mL/hr over 30 Minutes Intravenous  Once 02/22/23 1355 02/22/23 1738       Data Reviewed: I have personally reviewed following labs and imaging studies CBC: Recent Labs  Lab 02/19/23 1354 02/22/23 1335 02/22/23 1927 02/23/23 0412 02/24/23 0520  WBC 22.1* 32.4* 33.1* 29.4* 23.9*  NEUTROABS 14,630* 28.3*  --   --   --   HGB 10.0* 9.5* 9.2* 8.5* 7.8*  HCT 31.6* 28.9* 29.1* 26.0* 25.3*  MCV 82.5 81.9 82.2 81.0 84.3  PLT 213 196 230 237 279   Basic Metabolic Panel: Recent Labs  Lab 02/19/23 1354 02/22/23 1435 02/22/23 1927 02/23/23 0412 02/24/23 0520  NA 132* 130*  --  129* 128*  K 4.1 3.7  --  3.5 3.7  CL 97* 101  --  103 103  CO2 21 19*  --  16* 15*  GLUCOSE 117 118*  --  111* 130*  BUN 35* 36*  --  34* 39*  CREATININE 2.49*  2.66* 2.70* 2.62* 2.20*  CALCIUM 8.9 7.6*  --  7.2* 7.5*  MG  --   --   --  2.1  --    GFR: Estimated Creatinine Clearance: 14.6 mL/min (A) (by C-G formula based on SCr of 2.2 mg/dL (H)). Liver Function Tests: Recent Labs  Lab 02/19/23 1354 02/22/23 1435  AST 20 18  ALT 13 10  ALKPHOS  --  100  BILITOT 0.5 0.8  PROT 5.9* 5.2*  ALBUMIN  --  2.2*   CBG: No results for input(s): GLUCAP in the last 168 hours.  Recent Results (from the past 240 hours)  Urine Culture     Status: Abnormal   Collection Time: 02/19/23  1:54 PM  Result Value Ref Range Status   MICRO NUMBER: [id number]  Final   SPECIMEN QUALITY: Adequate  Final   Sample Source URINE, CLEAN CATCH  Final   STATUS: FINAL  Final   ISOLATE 1: Escherichia coli (A)  Final    Comment: Greater than 100,000 CFU/mL of Escherichia coli      Susceptibility   Escherichia coli - URINE CULTURE, REFLEX    AMOX/CLAVULANIC <=2 Sensitive     AMPICILLIN <=2 Sensitive     AMPICILLIN/SULBACTAM <=2 Sensitive     CEFAZOLIN* <=4 Not Reportable      * For infections other than uncomplicated UTI caused by E. coli, K. pneumoniae or P. mirabilis: Cefazolin is resistant if MIC > or = 8 mcg/mL. (Distinguishing susceptible versus intermediate for isolates with MIC < or = 4 mcg/mL requires additional testing.) For uncomplicated UTI caused by E. coli, K. pneumoniae or P. mirabilis: Cefazolin is susceptible if MIC <32 mcg/mL and predicts susceptible to the oral agents cefaclor, cefdinir, cefpodoxime, cefprozil, cefuroxime, cephalexin  and loracarbef.     CEFTAZIDIME <=1 Sensitive     CEFEPIME <=1 Sensitive     CEFTRIAXONE  <=1 Sensitive     CIPROFLOXACIN  <=0.25 Sensitive     LEVOFLOXACIN  <=0.12 Sensitive     GENTAMICIN <=1 Sensitive     IMIPENEM <=0.25 Sensitive     NITROFURANTOIN <=16 Sensitive     PIP/TAZO <=4 Sensitive     TOBRAMYCIN <=1 Sensitive     TRIMETH/SULFA* <=20 Sensitive      * For infections other than uncomplicated  UTI caused by E. coli, K. pneumoniae or P. mirabilis: Cefazolin is resistant if MIC > or = 8 mcg/mL. (Distinguishing susceptible versus intermediate for isolates with MIC < or = 4 mcg/mL requires additional testing.) For uncomplicated UTI caused by E. coli, K. pneumoniae or P. mirabilis: Cefazolin is susceptible if MIC <32 mcg/mL and predicts susceptible to the oral agents cefaclor, cefdinir, cefpodoxime, cefprozil, cefuroxime, cephalexin  and loracarbef. Legend: S = Susceptible  I = Intermediate R = Resistant  NS = Not susceptible SDD = Susceptible Dose Dependent * = Not Tested  NR = Not Reported **NN = See Therapy Comments   Urine Culture (for pregnant, neutropenic or urologic patients or patients with an indwelling urinary catheter)     Status: Abnormal (Preliminary result)   Collection Time: 02/22/23  1:12 PM   Specimen: Urine, Clean Catch  Result Value Ref Range Status   Specimen Description   Final    URINE, CLEAN CATCH Performed at [facility], [address], [place], [place] [address]    Special Requests   Final    NONE Performed at [facility], [address], [place], [place] [address]    Culture 80,000 COLONIES/mL ESCHERICHIA COLI (A)  Final   Report Status PENDING  Incomplete  Culture, blood (routine x 2)     Status: Abnormal (Preliminary result)   Collection Time: 02/22/23  1:35 PM   Specimen: BLOOD  Result Value Ref Range Status   Specimen Description   Final    BLOOD RIGHT ANTECUBITAL Performed at [facility], [address], [place], [place] [address]    Special Requests   Final    BOTTLES DRAWN AEROBIC AND ANAEROBIC Blood Culture results may not be optimal due to an inadequate volume of blood received in culture bottles Performed at [facility], [address], [place], [place] [address]    Culture  Setup Time   Final    GRAM NEGATIVE RODS IN BOTH AEROBIC AND ANAEROBIC  BOTTLES CRITICAL RESULT CALLED TO, READ  BACK BY AND VERIFIED WITH: L POINDEXTER,PHARMD@0515  02/23/23 MK    Culture (A)  Final    ESCHERICHIA COLI SUSCEPTIBILITIES TO FOLLOW Performed at [facility], [address], [place], [place] [address]    Report Status PENDING  Incomplete  Blood Culture ID Panel (Reflexed)     Status: Abnormal   Collection Time: 02/22/23  1:35 PM  Result Value Ref Range Status   Enterococcus faecalis NOT DETECTED NOT DETECTED Final   Enterococcus Faecium NOT DETECTED NOT DETECTED Final   Listeria monocytogenes NOT DETECTED NOT DETECTED Final   Staphylococcus species NOT DETECTED NOT DETECTED Final   Staphylococcus aureus (BCID) NOT DETECTED NOT DETECTED Final   Staphylococcus epidermidis NOT DETECTED NOT DETECTED Final   Staphylococcus lugdunensis NOT DETECTED NOT DETECTED Final   Streptococcus species NOT DETECTED NOT DETECTED Final   Streptococcus agalactiae NOT DETECTED NOT DETECTED Final   Streptococcus pneumoniae NOT DETECTED NOT DETECTED Final   Streptococcus pyogenes NOT DETECTED NOT DETECTED Final   A.calcoaceticus-baumannii NOT DETECTED NOT DETECTED Final   Bacteroides fragilis NOT DETECTED NOT DETECTED Final   Enterobacterales DETECTED (A) NOT DETECTED Final    Comment: Enterobacterales represent a large order of gram negative bacteria, not a single organism. CRITICAL RESULT CALLED TO, READ BACK BY AND VERIFIED WITH: L POINDEXTER,PHARMD@0515  02/23/23 MK    Enterobacter cloacae complex NOT DETECTED NOT DETECTED Final   Escherichia coli DETECTED (A) NOT DETECTED Final    Comment: CRITICAL RESULT CALLED TO, READ BACK BY AND VERIFIED WITH: L POINDEXTER,PHARMD@0515  02/23/23 MK    Klebsiella aerogenes NOT DETECTED NOT DETECTED Final   Klebsiella oxytoca NOT DETECTED NOT DETECTED Final   Klebsiella pneumoniae NOT DETECTED NOT DETECTED Final   Proteus species NOT DETECTED NOT DETECTED Final   Salmonella species NOT DETECTED NOT DETECTED Final    Serratia marcescens NOT DETECTED NOT DETECTED Final   Haemophilus influenzae NOT DETECTED NOT DETECTED Final   Neisseria meningitidis NOT DETECTED NOT DETECTED Final   Pseudomonas aeruginosa NOT DETECTED NOT DETECTED Final   Stenotrophomonas maltophilia NOT DETECTED NOT DETECTED Final   Candida albicans NOT DETECTED NOT DETECTED Final   Candida auris NOT DETECTED NOT DETECTED Final   Candida glabrata NOT DETECTED NOT DETECTED Final   Candida krusei NOT DETECTED NOT DETECTED Final   Candida parapsilosis NOT DETECTED NOT DETECTED Final   Candida tropicalis NOT DETECTED NOT DETECTED Final   Cryptococcus neoformans/gattii NOT DETECTED NOT DETECTED Final   CTX-M ESBL NOT DETECTED NOT DETECTED Final   Carbapenem resistance IMP NOT DETECTED NOT DETECTED Final   Carbapenem resistance KPC NOT DETECTED NOT DETECTED Final   Carbapenem resistance NDM NOT DETECTED NOT DETECTED Final   Carbapenem resist OXA 48 LIKE NOT DETECTED NOT DETECTED Final   Carbapenem resistance VIM NOT DETECTED NOT DETECTED Final    Comment: Performed at [facility], [address], [place], [place] [address]     Radiology Studies: No results found.   Scheduled Meds:  gabapentin   200 mg Oral QHS   heparin   5,000 Units Subcutaneous Q8H   mirabegron  ER  25 mg Oral Daily   nebivolol   5 mg Oral Daily   pantoprazole   40 mg Oral Daily   sodium chloride  flush  3 mL Intravenous Q12H   Continuous Infusions:  cefTRIAXone  (ROCEPHIN )  IV 2 g (02/24/23 1434)     LOS: 3 days    Time spent:  55min  Laronn Devonshire, DO [facility]  To contact the attending physician  between 7A-7P please use Epic Chat. To contact the covering physician during after hours 7P-7A, please review Amion.   02/25/2023, 7:46 AM   *This document has been created with the assistance of dictation software. Please excuse typographical errors. *

## 2023-02-26 ENCOUNTER — Inpatient Hospital Stay (HOSPITAL_COMMUNITY): Payer: 59

## 2023-02-26 DIAGNOSIS — E871 Hypo-osmolality and hyponatremia: Secondary | ICD-10-CM

## 2023-02-26 DIAGNOSIS — B002 Herpesviral gingivostomatitis and pharyngotonsillitis: Secondary | ICD-10-CM | POA: Insufficient documentation

## 2023-02-26 DIAGNOSIS — B9620 Unspecified Escherichia coli [E. coli] as the cause of diseases classified elsewhere: Secondary | ICD-10-CM

## 2023-02-26 DIAGNOSIS — B3781 Candidal esophagitis: Secondary | ICD-10-CM | POA: Insufficient documentation

## 2023-02-26 DIAGNOSIS — D649 Anemia, unspecified: Secondary | ICD-10-CM

## 2023-02-26 DIAGNOSIS — R7881 Bacteremia: Secondary | ICD-10-CM

## 2023-02-26 DIAGNOSIS — R932 Abnormal findings on diagnostic imaging of liver and biliary tract: Secondary | ICD-10-CM | POA: Diagnosis not present

## 2023-02-26 DIAGNOSIS — N390 Urinary tract infection, site not specified: Secondary | ICD-10-CM | POA: Diagnosis not present

## 2023-02-26 DIAGNOSIS — N1832 Chronic kidney disease, stage 3b: Secondary | ICD-10-CM

## 2023-02-26 DIAGNOSIS — N179 Acute kidney failure, unspecified: Secondary | ICD-10-CM | POA: Diagnosis not present

## 2023-02-26 LAB — COMPREHENSIVE METABOLIC PANEL
ALT: 11 U/L (ref 0–44)
AST: 15 U/L (ref 15–41)
Albumin: 1.9 g/dL — ABNORMAL LOW (ref 3.5–5.0)
Alkaline Phosphatase: 75 U/L (ref 38–126)
Anion gap: 7 (ref 5–15)
BUN: 33 mg/dL — ABNORMAL HIGH (ref 8–23)
CO2: 17 mmol/L — ABNORMAL LOW (ref 22–32)
Calcium: 7.6 mg/dL — ABNORMAL LOW (ref 8.9–10.3)
Chloride: 113 mmol/L — ABNORMAL HIGH (ref 98–111)
Creatinine, Ser: 1.79 mg/dL — ABNORMAL HIGH (ref 0.44–1.00)
GFR, Estimated: 27 mL/min — ABNORMAL LOW (ref >60)
Glucose, Bld: 100 mg/dL — ABNORMAL HIGH (ref 70–99)
Potassium: 3.8 mmol/L (ref 3.5–5.1)
Sodium: 137 mmol/L (ref 135–145)
Total Bilirubin: 0.4 mg/dL (ref 0.0–1.2)
Total Protein: 5 g/dL — ABNORMAL LOW (ref 6.5–8.1)

## 2023-02-26 LAB — MAGNESIUM: Magnesium: 1.7 mg/dL (ref 1.7–2.4)

## 2023-02-26 LAB — CBC WITH DIFFERENTIAL/PLATELET
Abs Immature Granulocytes: 0.4 10*3/uL — ABNORMAL HIGH (ref 0.00–0.07)
Basophils Absolute: 0.1 10*3/uL (ref 0.0–0.1)
Basophils Relative: 0 %
Eosinophils Absolute: 0.2 10*3/uL (ref 0.0–0.5)
Eosinophils Relative: 2 %
HCT: 22.5 % — ABNORMAL LOW (ref 36.0–46.0)
Hemoglobin: 7.3 g/dL — ABNORMAL LOW (ref 12.0–15.0)
Immature Granulocytes: 3 %
Lymphocytes Relative: 8 %
Lymphs Abs: 1.2 10*3/uL (ref 0.7–4.0)
MCH: 26.2 pg (ref 26.0–34.0)
MCHC: 32.4 g/dL (ref 30.0–36.0)
MCV: 80.6 fL (ref 80.0–100.0)
Monocytes Absolute: 0.8 10*3/uL (ref 0.1–1.0)
Monocytes Relative: 5 %
Neutro Abs: 11.9 10*3/uL — ABNORMAL HIGH (ref 1.7–7.7)
Neutrophils Relative %: 82 %
Platelets: 479 10*3/uL — ABNORMAL HIGH (ref 150–400)
RBC: 2.79 MIL/uL — ABNORMAL LOW (ref 3.87–5.11)
RDW: 15.2 % (ref 11.5–15.5)
WBC: 14.5 10*3/uL — ABNORMAL HIGH (ref 4.0–10.5)
nRBC: 0 % (ref 0.0–0.2)

## 2023-02-26 LAB — PHOSPHORUS: Phosphorus: 2.2 mg/dL — ABNORMAL LOW (ref 2.5–4.6)

## 2023-02-26 MED ORDER — MAGIC MOUTHWASH
15.0000 mL | Freq: Four times a day (QID) | ORAL | Status: DC
  Administered 2023-02-26 (×2): 15 mL via ORAL
  Filled 2023-02-26 (×11): qty 15

## 2023-02-26 MED ORDER — LORAZEPAM 0.5 MG PO TABS
0.5000 mg | ORAL_TABLET | Freq: Two times a day (BID) | ORAL | Status: DC
  Administered 2023-02-26 – 2023-03-01 (×6): 0.5 mg via ORAL
  Filled 2023-02-26 (×6): qty 1

## 2023-02-26 MED ORDER — FERROUS SULFATE 325 (65 FE) MG PO TABS
325.0000 mg | ORAL_TABLET | Freq: Every day | ORAL | Status: DC
  Administered 2023-02-27 – 2023-03-01 (×2): 325 mg via ORAL
  Filled 2023-02-26 (×3): qty 1

## 2023-02-26 MED ORDER — NYSTATIN 100000 UNIT/ML MT SUSP: 5.0000 mL | Freq: Four times a day (QID) | OROMUCOSAL | Status: DC

## 2023-02-26 MED ORDER — BENZONATATE 100 MG PO CAPS: 100.0000 mg | ORAL_CAPSULE | Freq: Two times a day (BID) | ORAL | Status: DC | PRN

## 2023-02-26 MED ORDER — SODIUM CHLORIDE 0.9 % IV SOLN
100.0000 mg | Freq: Once | INTRAVENOUS | Status: AC
  Administered 2023-02-27: 100 mg via INTRAVENOUS
  Filled 2023-02-26: qty 5

## 2023-02-26 MED ORDER — SODIUM CHLORIDE 0.9 % IV BOLUS
1000.0000 mL | Freq: Once | INTRAVENOUS | Status: AC
  Administered 2023-02-26: 1000 mL via INTRAVENOUS

## 2023-02-26 NOTE — Plan of Care (Signed)

## 2023-02-26 NOTE — Plan of Care (Signed)
  Problem: Activity: Goal: Risk for activity intolerance will decrease Outcome: Progressing   Problem: Nutrition: Goal: Adequate nutrition will be maintained Outcome: Progressing   Problem: Pain Management: Goal: General experience of comfort will improve Outcome: Progressing   Problem: Safety: Goal: Ability to remain free from injury will improve Outcome: Progressing

## 2023-02-26 NOTE — Progress Notes (Signed)
 Physical Therapy Treatment Patient Details Name: [name] MRN: [id number] DOB: 12-26-1935 Today's Date: 02/26/2023   History of Present Illness Pt is 88 yo female presented on 02/22/23 with fatigue and decreased intake.  She was found to have AKI, hyponatremia, and CT scan which revealed liver and gallbladder abnormalities as well as lymphadenopathy concerning for malignancy.  Pt also with acute hypoxia like due to aspiration PNE requiring O2.  Pt with hx including but not limited to ETOH abuse, anemia, HLD, HTN, scolosis    PT Comments  Pt making gradual progress. She was able to ambulate 200' with RW.  Tried a few steps without AD but was unsteady.  Advised using RW initially at home.  O2 sats 92% on RA ambulation but down to 88% when in supine so back on 2 L O2.  Pt fatigued after ambulation.  Continue to recommend HHPT.     If plan is discharge home, recommend the following: A little help with walking and/or transfers;A little help with bathing/dressing/bathroom;Assistance with cooking/housework;Assist for transportation   Can travel by private vehicle        Equipment Recommendations  Rolling walker (2 wheels)    Recommendations for Other Services       Precautions / Restrictions Precautions Precautions: Fall     Mobility  Bed Mobility Overal bed mobility: Needs Assistance Bed Mobility: Sit to Supine       Sit to supine: Supervision   General bed mobility comments: Pt was on toilet at arrival, returned to bed    Transfers Overall transfer level: Needs assistance Equipment used: Rolling walker (2 wheels), None Transfers: Sit to/from Stand Sit to Stand: Supervision           General transfer comment: Close supervision; stood with and without RW; performed her toileting ADLs    Ambulation/Gait Ambulation/Gait assistance: Contact guard assist, Supervision Gait Distance (Feet): 200 Feet Assistive device: None, Rolling walker (2 wheels) Gait Pattern/deviations:  Step-through pattern Gait velocity: decreased     General Gait Details: Pt taking a few steps in room without RW but hands in high guard, drifting R/L, and unsteady.  Pt's stability improved with RW and able to ambulate 100' with close supervision.  Advised use of RW at home (pt reports she is getting on her own)   Comptroller Bed    Modified Rankin (Stroke Patients Only)       Balance Overall balance assessment: Needs assistance Sitting-balance support: Feet supported, No upper extremity supported Sitting balance-Leahy Scale: Good     Standing balance support: No upper extremity supported, During functional activity, Bilateral upper extremity supported Standing balance-Leahy Scale: Fair Standing balance comment: Static stand without UE support with close supervision; needs RW for ambulation                            Cognition Arousal: Alert Behavior During Therapy: WFL for tasks assessed/performed Overall Cognitive Status: Within Functional Limits for tasks assessed                                          Exercises Other Exercises Other Exercises: Reports fatigued after walk and wants to lie down    General Comments General comments (skin integrity, edema, etc.): Pt was  on RA at arrival when on toilet (has been on 2 L).  She ambulated on RA and sats maintained 92%.  However, with return to supine position down to 88-89% so replaced O2 and back to 94%.  Notified RN.  Educated pt on taking deep breaths, trying to wean O2.      Pertinent Vitals/Pain Pain Assessment Pain Assessment: No/denies pain    Home Living                          Prior Function            PT Goals (current goals can now be found in the care plan section) Progress towards PT goals: Progressing toward goals    Frequency    Min 1X/week      PT Plan      Co-evaluation              AM-PAC PT  6 Clicks Mobility   Outcome Measure  Help needed turning from your back to your side while in a flat bed without using bedrails?: None Help needed moving from lying on your back to sitting on the side of a flat bed without using bedrails?: None Help needed moving to and from a bed to a chair (including a wheelchair)?: A Little Help needed standing up from a chair using your arms (e.g., wheelchair or bedside chair)?: A Little Help needed to walk in hospital room?: A Little Help needed climbing 3-5 steps with a railing? : A Little 6 Click Score: 20    End of Session Equipment Utilized During Treatment: Gait belt Activity Tolerance: Patient tolerated treatment well Patient left: with call bell/phone within reach;in bed;with bed alarm set;with family/visitor present Nurse Communication: Mobility status PT Visit Diagnosis: Unsteadiness on feet (R26.81);Other abnormalities of gait and mobility (R26.89);Muscle weakness (generalized) (M62.81);Difficulty in walking, not elsewhere classified (R26.2)     Time: 1030-1045 PT Time Calculation (min) (ACUTE ONLY): 15 min  Charges:    $Gait Training: 8-22 mins PT General Charges $$ ACUTE PT VISIT: 1 Visit                     Benjiman, PT Acute Rehab Services [facility] [phone]    Benjiman VEAR Mulberry 02/26/2023, 11:05 AM

## 2023-02-26 NOTE — Assessment & Plan Note (Signed)
 Resolved

## 2023-02-26 NOTE — Assessment & Plan Note (Signed)
 Incidental finding during initial hospitalization.   Once renal injury improves further, we will likely proceed with dedicated MRI of liver with contrast to better evaluate for possible malignancy with both gallbladder and liver involvement.

## 2023-02-26 NOTE — Assessment & Plan Note (Signed)
 E. coli urinary tract infection complicated with concurrent E. coli bacteremia No evidence of pyelonephritis on CT imaging. Continuing to treat with intravenous ceftriaxone  Will need minimum of 7 days of intravenous antibiotics Slow clinical improvement

## 2023-02-26 NOTE — Progress Notes (Signed)
 Please keep patient NPO at midnight, for a renal ultrasound on 02/27/2023

## 2023-02-26 NOTE — Assessment & Plan Note (Signed)
 Early thrush noted on exam today with complaints of significant oral pain Initiating a 7-day course of Magic mouthwash

## 2023-02-26 NOTE — Assessment & Plan Note (Signed)
 Substantial anemia without any clinical evidence of ongoing bleeding Likely a combination of anemia of chronic disease, iron deficiency anemia and hemodilution Will additionally obtain folate and vitamin B12 level

## 2023-02-26 NOTE — Progress Notes (Signed)
 PROGRESS NOTE   [name]  FMW:[id number] DOB: [birth year] DOA: 02/22/2023 PCP: Caro Harlene POUR, NP   Date of Service: the patient was seen and examined on 02/26/2023  Brief Narrative:  88 year old woman PMH alcoholism, cholelithiasis, hyperlipidemia, hypertension with complaints of malaise fatigue and decreased oral intake for the past 5 to 6 days.  Patient was sent to the [facility] emergency department identified by his primary care provider he was found to have developed an elevated creatinine of 2.4.    In the ED noted to have mild hyponatremia, AKI, suspected UTI, marked leukocytosis.  CT imaging of the abdomen pelvis revealed liver and gallbladder abnormalities as well as lymphadenopathy concerning for underlying malignancy.  And imaging abnormalities of the liver and gallbladder with lymphadenopathy concerning for malignancy.  For patient suspected urinary tract infection, patient was initiated on intravenous ceftriaxone .  Both blood and urine cultures came back positive for E. coli.  For management of both urinary tract infection and acute kidney injury patient was placed on intravenous fluids.  Renal function has been monitored closely with serial chemistries with the eventual plan to perform MRI of the liver with contrast for better evaluation of the liver lesions.   Assessment & Plan Acute renal failure superimposed on stage 3b chronic kidney disease (HCC) Creatinine now slowly improving after several days of intravenous fluids Continuing gentle intravenous fluids Monitoring renal function and electrolytes with serial chemistries Treating concurrent urinary tract infection which may be contributing. Dr. Geralynn with nephrology following, his input is appreciated. Strict input and output monitoring E-coli UTI E. coli urinary tract infection complicated with concurrent E. coli bacteremia No evidence of pyelonephritis on CT imaging. Continuing to treat with intravenous  ceftriaxone  Will need minimum of 7 days of intravenous antibiotics Slow clinical improvement E coli bacteremia Please see assessment and plan above Normocytic anemia Substantial anemia without any clinical evidence of ongoing bleeding Likely a combination of anemia of chronic disease, iron  deficiency anemia and hemodilution Will additionally obtain folate and vitamin B12 level Abnormal CT scan, gallbladder Incidental finding during initial hospitalization.   Once renal injury improves further, we will likely proceed with dedicated MRI of liver with contrast to better evaluate for possible malignancy with both gallbladder and liver involvement. Esophageal thrush (HCC) Early thrush noted on exam today with complaints of significant oral pain Initiating a 7-day course of Magic mouthwash Hyponatremia Resolved     Subjective:  Patient states that her generalized weakness seems to be slowly improving compared to initial hospitalization.  Patient also was complaining of severe oral pain today with patient concerns for thrush.  Patient reports some associated difficulty swallowing.  Physical Exam:  Vitals:   02/26/23 0434 02/26/23 0715 02/26/23 1508 02/26/23 1939  BP: 128/67  135/68 (!) 145/68  Pulse: 85  81 82  Resp: 18  14 16   Temp: 97.7 F (36.5 C)  98.5 F (36.9 C) 99.4 F (37.4 C)  TempSrc:      SpO2: 94%  98% 97%  Weight:  49.4 kg    Height:        Constitutional: Awake alert and oriented x3, no associated distress.   Skin: no rashes, no lesions, good skin turgor noted. Eyes: Pupils are equally reactive to light.  No evidence of scleral icterus or conjunctival pallor.  ENMT: Moist mucous membranes noted.  Posterior pharynx clear of any exudate or lesions.   Respiratory: clear to auscultation bilaterally, no wheezing, no crackles. Normal respiratory effort. No accessory muscle use.  Cardiovascular: Regular rate and rhythm, no murmurs / rubs / gallops. No extremity edema. 2+  pedal pulses. No carotid bruits.  Abdomen: Abdomen is soft and nontender.  No evidence of intra-abdominal masses.  Positive bowel sounds noted in all quadrants.   Musculoskeletal: No joint deformity upper and lower extremities. Good ROM, no contractures. Normal muscle tone.    Data Reviewed:  I have personally reviewed and interpreted labs, imaging.  Significant findings are   CBC: Recent Labs  Lab 02/22/23 1335 02/22/23 1927 02/23/23 0412 02/24/23 0520 02/25/23 0806 02/26/23 0542  WBC 32.4* 33.1* 29.4* 23.9* 17.5* 14.5*  NEUTROABS 28.3*  --   --   --  14.6* 11.9*  HGB 9.5* 9.2* 8.5* 7.8* 8.0* 7.3*  HCT 28.9* 29.1* 26.0* 25.3* 25.5* 22.5*  MCV 81.9 82.2 81.0 84.3 82.8 80.6  PLT 196 230 237 279 391 479*   Basic Metabolic Panel: Recent Labs  Lab 02/22/23 1435 02/22/23 1927 02/23/23 0412 02/24/23 0520 02/25/23 0806 02/26/23 0542  NA 130*  --  129* 128* 137 137  K 3.7  --  3.5 3.7 3.8 3.8  CL 101  --  103 103 109 113*  CO2 19*  --  16* 15* 17* 17*  GLUCOSE 118*  --  111* 130* 113* 100*  BUN 36*  --  34* 39* 41* 33*  CREATININE 2.66* 2.70* 2.62* 2.20* 2.45* 1.79*  CALCIUM 7.6*  --  7.2* 7.5* 7.9* 7.6*  MG  --   --  2.1  --   --  1.7  PHOS  --   --   --   --   --  2.2*   GFR: Estimated Creatinine Clearance: 17.9 mL/min (A) (by C-G formula based on SCr of 1.79 mg/dL (H)). Liver Function Tests: Recent Labs  Lab 02/22/23 1435 02/25/23 0806 02/26/23 0542  AST 18 15 15   ALT 10 11 11   ALKPHOS 100 89 75  BILITOT 0.8 0.4 0.4  PROT 5.2* 5.2* 5.0*  ALBUMIN 2.2* 2.0* 1.9*    Coagulation Profile: Recent Labs  Lab 02/23/23 0412  INR 1.2    Code Status:  DNR.  Code status decision has been confirmed with: patient   Severity of Illness:  The appropriate patient status for this patient is INPATIENT. Inpatient status is judged to be reasonable and necessary in order to provide the required intensity of service to ensure the patient's safety. The patient's  presenting symptoms, physical exam findings, and initial radiographic and laboratory data in the context of their chronic comorbidities is felt to place them at high risk for further clinical deterioration. Furthermore, it is not anticipated that the patient will be medically stable for discharge from the hospital within 2 midnights of admission.   * I certify that at the point of admission it is my clinical judgment that the patient will require inpatient hospital care spanning beyond 2 midnights from the point of admission due to high intensity of service, high risk for further deterioration and high frequency of surveillance required.*  Time spent:  52 minutes  Author:  Zachary JINNY Ba MD  02/26/2023 11:04 PM

## 2023-02-26 NOTE — Assessment & Plan Note (Signed)
·   Please see assessment and plan above °

## 2023-02-26 NOTE — Assessment & Plan Note (Signed)
 Creatinine now slowly improving after several days of intravenous fluids Continuing gentle intravenous fluids Monitoring renal function and electrolytes with serial chemistries Treating concurrent urinary tract infection which may be contributing. Dr. Geralynn with nephrology following, his input is appreciated. Strict input and output monitoring

## 2023-02-26 NOTE — Progress Notes (Addendum)
 [facility] Progress Note  Subjective: 450 cc yest , improving. Creat down to 1.79 today.   Vitals:   02/25/23 0610 02/25/23 1157 02/25/23 2037 02/26/23 0434  BP: 139/81 118/65 127/67 128/67  Pulse: 87 75 83 85  Resp: 18 18 19 18   Temp: 99.2 F (37.3 C) 99.4 F (37.4 C) 99.8 F (37.7 C) 97.7 F (36.5 C)  TempSrc: Oral     SpO2: 95% 98% 96% 94%  Weight:      Height:        Exam: Gen alert, no distress No rash, cyanosis or gangrene Sclera anicteric, throat clear  No jvd or bruits Chest clear bilat to bases RRR no MRG Abd soft ntnd no mass or ascites +bs Ext no LE edema Neuro is alert, Ox 3 , nf      Date                             Creat               eGFR______ 7984- 2019                  1.06- 1.33 2020                            1.14- 1.53 2021- 2022                  1.26- 1.52        34- 42 ml/min 2023                            1.44- 1.53        33- 36 ml/min Jun 2024                     1.52                 34 ml/min 02/19/23                        2.49                 18                     02/21/33                        2.66                 17 1/07                             2.62 1/08                             2.20                 21 1/09                             2.45                 19     UA 1/06 - cloudy, large LE, prot 100, 1.0911, many bact, rbc 21-50, wbc > 50  CT renal stone 1/06 - Urinary Tract: No suspicious renal mass on this noncontrast enhanced examination, calculi or hydronephrosis. No focal bladder wall thickening.   BP 120/ 65, HR 75  RR 19, afeb 99.4       UCx + EColi on 1/06     BCx + EColi on 1/06 as well      UNa  75, UCr 44   Assessment/ Plan: AKI on CKD 3b - b/l creat 1..4- 1.6 from 2023 and June 2024, eGFR 33- 36 ml/min. Creat here was 2.4 on admission w/ peak of 2.6 and low of 2.2 since admission but still up at 2.45 today. This in setting of an acute illness w/ several days of poor po intake, also has UTI and  bacteremia. No sig hypotension here. CT scan is w/o hydro. No contrast. Suspect AKI is due to acute infection + vol depletion. Responded well to 2 L bolus overnight, creat down 1.7. Will bolus again 1 L tonight, cont IVF's and f/u labs in am.  Met acidosis - due to renal failure. Cont bicarb gtt at 75 cc/hr.  EColi urosepsis - on IV abx HTN - bystolic  dc'd due to low-normal BP's        Myer Fret MD  [facility] 02/26/2023, 2:49 PM  Recent Labs  Lab 02/25/23 0806 02/26/23 0542  HGB 8.0* 7.3*  ALBUMIN 2.0* 1.9*  CALCIUM 7.9* 7.6*  PHOS  --  2.2*  CREATININE 2.45* 1.79*  K 3.8 3.8   Recent Labs  Lab 02/25/23 0519  IRON  23*  TIBC 255  FERRITIN 52   Inpatient medications:  gabapentin   200 mg Oral QHS   heparin   5,000 Units Subcutaneous Q8H   mirabegron  ER  25 mg Oral Daily   pantoprazole   40 mg Oral Daily   sodium chloride  flush  3 mL Intravenous Q12H    cefTRIAXone  (ROCEPHIN )  IV 2 g (02/25/23 1558)   sodium bicarbonate  150 mEq in sterile water  1,150 mL infusion 65 mL/hr at 02/25/23 2347   acetaminophen  **OR** acetaminophen , benzonatate , liver oil-zinc  oxide, LORazepam , menthol -cetylpyridinium, ondansetron  **OR** ondansetron  (ZOFRAN ) IV, polyethylene glycol, polyvinyl alcohol , sodium chloride  flush, triamcinolone  cream

## 2023-02-27 ENCOUNTER — Inpatient Hospital Stay (HOSPITAL_COMMUNITY): Payer: 59

## 2023-02-27 DIAGNOSIS — N179 Acute kidney failure, unspecified: Secondary | ICD-10-CM | POA: Diagnosis not present

## 2023-02-27 DIAGNOSIS — I10 Essential (primary) hypertension: Secondary | ICD-10-CM | POA: Diagnosis not present

## 2023-02-27 DIAGNOSIS — E538 Deficiency of other specified B group vitamins: Secondary | ICD-10-CM | POA: Insufficient documentation

## 2023-02-27 DIAGNOSIS — R932 Abnormal findings on diagnostic imaging of liver and biliary tract: Secondary | ICD-10-CM | POA: Diagnosis not present

## 2023-02-27 DIAGNOSIS — Z7189 Other specified counseling: Secondary | ICD-10-CM

## 2023-02-27 DIAGNOSIS — R7881 Bacteremia: Secondary | ICD-10-CM | POA: Diagnosis not present

## 2023-02-27 DIAGNOSIS — N390 Urinary tract infection, site not specified: Secondary | ICD-10-CM | POA: Diagnosis not present

## 2023-02-27 LAB — COMPREHENSIVE METABOLIC PANEL
ALT: 13 U/L (ref 0–44)
AST: 21 U/L (ref 15–41)
Albumin: 2.2 g/dL — ABNORMAL LOW (ref 3.5–5.0)
Alkaline Phosphatase: 91 U/L (ref 38–126)
Anion gap: 11 (ref 5–15)
BUN: 26 mg/dL — ABNORMAL HIGH (ref 8–23)
CO2: 24 mmol/L (ref 22–32)
Calcium: 8 mg/dL — ABNORMAL LOW (ref 8.9–10.3)
Chloride: 104 mmol/L (ref 98–111)
Creatinine, Ser: 1.75 mg/dL — ABNORMAL HIGH (ref 0.44–1.00)
GFR, Estimated: 28 mL/min — ABNORMAL LOW (ref >60)
Glucose, Bld: 99 mg/dL (ref 70–99)
Potassium: 3.9 mmol/L (ref 3.5–5.1)
Sodium: 139 mmol/L (ref 135–145)
Total Bilirubin: 0.4 mg/dL (ref 0.0–1.2)
Total Protein: 5.6 g/dL — ABNORMAL LOW (ref 6.5–8.1)

## 2023-02-27 LAB — CBC WITH DIFFERENTIAL/PLATELET
Abs Immature Granulocytes: 0.37 10*3/uL — ABNORMAL HIGH (ref 0.00–0.07)
Basophils Absolute: 0.1 10*3/uL (ref 0.0–0.1)
Basophils Relative: 0 %
Eosinophils Absolute: 0.4 10*3/uL (ref 0.0–0.5)
Eosinophils Relative: 2 %
HCT: 25 % — ABNORMAL LOW (ref 36.0–46.0)
Hemoglobin: 8.1 g/dL — ABNORMAL LOW (ref 12.0–15.0)
Immature Granulocytes: 2 %
Lymphocytes Relative: 11 %
Lymphs Abs: 1.6 10*3/uL (ref 0.7–4.0)
MCH: 26.1 pg (ref 26.0–34.0)
MCHC: 32.4 g/dL (ref 30.0–36.0)
MCV: 80.6 fL (ref 80.0–100.0)
Monocytes Absolute: 0.9 10*3/uL (ref 0.1–1.0)
Monocytes Relative: 6 %
Neutro Abs: 11.8 10*3/uL — ABNORMAL HIGH (ref 1.7–7.7)
Neutrophils Relative %: 79 %
Platelets: 531 10*3/uL — ABNORMAL HIGH (ref 150–400)
RBC: 3.1 MIL/uL — ABNORMAL LOW (ref 3.87–5.11)
RDW: 15.5 % (ref 11.5–15.5)
WBC: 15.2 10*3/uL — ABNORMAL HIGH (ref 4.0–10.5)
nRBC: 0 % (ref 0.0–0.2)

## 2023-02-27 LAB — VITAMIN B12: Vitamin B-12: 152 pg/mL — ABNORMAL LOW (ref 180–914)

## 2023-02-27 LAB — TYPE AND SCREEN
ABO/RH(D): O POS
Antibody Screen: NEGATIVE

## 2023-02-27 LAB — FERRITIN: Ferritin: 70 ng/mL (ref 11–307)

## 2023-02-27 LAB — MAGNESIUM: Magnesium: 1.8 mg/dL (ref 1.7–2.4)

## 2023-02-27 LAB — PHOSPHORUS: Phosphorus: 2.2 mg/dL — ABNORMAL LOW (ref 2.5–4.6)

## 2023-02-27 MED ORDER — LACTATED RINGERS IV SOLN
INTRAVENOUS | Status: DC
Start: 2023-02-27 — End: 2023-02-28

## 2023-02-27 MED ORDER — CYANOCOBALAMIN 1000 MCG/ML IJ SOLN
1000.0000 ug | Freq: Once | INTRAMUSCULAR | Status: AC
  Administered 2023-02-27: 1000 ug via INTRAMUSCULAR
  Filled 2023-02-27: qty 1

## 2023-02-27 MED ORDER — VITAMIN B-12 1000 MCG PO TABS
1000.0000 ug | ORAL_TABLET | Freq: Every day | ORAL | Status: DC
  Administered 2023-02-28 – 2023-03-01 (×2): 1000 ug via ORAL
  Filled 2023-02-27 (×2): qty 1

## 2023-02-27 NOTE — Plan of Care (Signed)

## 2023-02-27 NOTE — Assessment & Plan Note (Addendum)
 Creatinine seems to be leveling out Baseline creatinine previously 1.5. Continuing gentle intravenous fluids Monitoring renal function and electrolytes with serial chemistries Treating concurrent urinary tract infection which may be contributing. Dr. Geralynn with nephrology has been following.  His input is appreciated Strict input and output monitoring

## 2023-02-27 NOTE — Assessment & Plan Note (Signed)
 Substantial vitamin B12 deficiency Providing patient with intramuscular vitamin B12 followed by daily vitamin B12 oral supplementation.

## 2023-02-27 NOTE — Assessment & Plan Note (Signed)
 Daily oral ferrous sulfate

## 2023-02-27 NOTE — Assessment & Plan Note (Signed)
 Resolved

## 2023-02-27 NOTE — Assessment & Plan Note (Signed)
 Lengthy discussion with the patient concerning her gradual decline in overall performance status in the setting of incidental findings of gallbladder and liver abnormalities concerning for likely malignancy. Hepatobiliary malignancy would entail a poor prognosis and patient does not wish to pursue any further workup but she states she does not desire to pursue any potential treatments. Not exploring treatment options with certainly mean the patient's prognosis is poor Patient requesting hospice referral and evaluation.  Thing is how patient is still receiving intravenous fluids and antibiotics will obtain GIP bed evaluation. Patient is DNR

## 2023-02-27 NOTE — Assessment & Plan Note (Signed)
·   Please see assessment and plan above °

## 2023-02-27 NOTE — Assessment & Plan Note (Addendum)
 Substantial anemia without any clinical evidence of ongoing bleeding Likely a combination of anemia of chronic disease, iron deficiency anemia, vitamin B12 deficiency and hemodilution Providing patient with iron and vitamin B12 supplementation.

## 2023-02-27 NOTE — Progress Notes (Signed)
 [facility] Progress Note  Subjective: Creat stable at 1.7 today. UOP 450 yesterday. Attending asked about giving CT or MR contrast and I don't think that we can use either one due to her borderline renal function. Furthermore, Dr Kenard notified me that the renal noncon CT done 1/06 showed findings suspicious for gallbladder malignancy, either primary or metastatic, and also showed new periportal and retroaortic lymphadenopathy suspicious for metastatic disease. I asked her about cancer treatments and she adamantly said that she will not take cancer treatments and that she has a group of her friends lined up to back her up in that regard, and that really what she wants to do is go home, with or without hospice.  I passed this along to Dr Kenard.   Vitals:   02/26/23 0715 02/26/23 1508 02/26/23 1939 02/27/23 0358  BP:  135/68 (!) 145/68 133/69  Pulse:  81 82 90  Resp:  14 16 15   Temp:  98.5 F (36.9 C) 99.4 F (37.4 C) 98.3 F (36.8 C)  TempSrc:    Oral  SpO2:  98% 97% 92%  Weight: 49.4 kg     Height:        Exam: Gen alert, no distress No rash, cyanosis or gangrene Sclera anicteric, throat clear  No jvd or bruits Chest clear bilat to bases RRR no MRG Abd soft ntnd no mass or ascites +bs Ext no LE edema Neuro is alert, Ox 3 , nf      Date                             Creat               eGFR______ 7984- 2019                  1.06- 1.33 2020                            1.14- 1.53 2021- 2022                  1.26- 1.52        34- 42 ml/min 2023                            1.44- 1.53        33- 36 ml/min Jun 2024                     1.52                 34 ml/min 02/19/23                        2.49                 18                     02/21/33                        2.66                 17 1/07                             2.62 1/08  2.20                 21 1/09                             2.45                 19     UA 1/06 -  cloudy, large LE, prot 100, 1.0911, many bact, rbc 21-50, wbc > 50   CT renal stone 1/06 - Urinary Tract: No suspicious renal mass on this noncontrast enhanced examination, calculi or hydronephrosis. No focal bladder wall thickening.   BP 120/ 65, HR 75  RR 19, afeb 99.4       UCx + EColi on 1/06     BCx + EColi on 1/06 as well      UNa  75, UCr 44   Assessment/ Plan: AKI on CKD 3b - b/l creat 1..4- 1.6 from 2023 and June 2024, eGFR 33- 36 ml/min. Creat here was 2.4 on admission w/ peak of 2.6 and low of 2.2 since admission but still up at 2.45 today. This in setting of an acute illness w/ several days of poor po intake, also has UTI and bacteremia. No sig hypotension here. CT scan is w/o hydro. No contrast. Suspect AKI from acute infection + vol depletion. Responded well to 2 L bolus initially, creat was down to 1.7 yesterday and is stable today at 1.7. Creat is very close to baseline. Pt declines f/u w/ [facility]. Will dc IVFs.  Have d/w pmd. Will sign off. Met acidosis - due to renal failure. Cont bicarb gtt at 75 cc/hr.  Possible GB malignancy w/ local lymph nodes suggesting metastases - per noncon CT on 1/06.  I don't think MR or CT contrast are safe for this patient.  EColi urosepsis - on IV abx HTN - bystolic  dc'd due to low-normal BP's        Myer Fret MD  [facility] 02/27/2023, 1:42 PM  Recent Labs  Lab 02/26/23 0542 02/27/23 0823  HGB 7.3* 8.1*  ALBUMIN 1.9* 2.2*  CALCIUM 7.6* 8.0*  PHOS 2.2* 2.2*  CREATININE 1.79* 1.75*  K 3.8 3.9   Recent Labs  Lab 02/25/23 0519 02/27/23 0823  IRON  23*  --   TIBC 255  --   FERRITIN 52 70   Inpatient medications:  ferrous sulfate   325 mg Oral QAC breakfast   gabapentin   200 mg Oral QHS   heparin   5,000 Units Subcutaneous Q8H   LORazepam   0.5 mg Oral BID   magic mouthwash  15 mL Oral QID   mirabegron  ER  25 mg Oral Daily   pantoprazole   40 mg Oral Daily   sodium chloride  flush  3 mL Intravenous Q12H    cefTRIAXone  (ROCEPHIN )  IV 2 g  (02/26/23 1527)   sodium bicarbonate  150 mEq in sterile water  1,150 mL infusion 75 mL/hr at 02/26/23 2036   acetaminophen  **OR** acetaminophen , benzonatate , liver oil-zinc  oxide, menthol -cetylpyridinium, ondansetron  **OR** ondansetron  (ZOFRAN ) IV, polyethylene glycol, polyvinyl alcohol , sodium chloride  flush, triamcinolone  cream

## 2023-02-27 NOTE — Progress Notes (Signed)
 Renal arterial duplex completed. Please see CV Procedures for preliminary results.  Shona Simpson, RVT 02/27/23 9:50 AM

## 2023-02-27 NOTE — Plan of Care (Signed)

## 2023-02-27 NOTE — Assessment & Plan Note (Addendum)
 Early thrush noted on exam 1/10 with complaints of significant oral pain Receiving a 7-day course of Magic mouthwash

## 2023-02-27 NOTE — TOC Progression Note (Addendum)
 Transition of Care [facility]) - Progression Note    Patient Details  Name: REMY DIA MRN: [id number] Date of Birth: 1936-01-31  Transition of Care [facility]) CM/SW Contact  Mitzie LOISE [place], [place] Phone Number: 02/27/2023, 4:48 PM  Clinical Narrative:     This CSW was notified by provider Zachary Kenard COME requesting referral for [facility] palliative. This CSW made contact with [facility] representative Melissa Stenson. CSW/ TOC will assist and follow with referral.   Expected Discharge Plan: Home w Home Health Services Barriers to Discharge: Continued Medical Work up  Expected Discharge Plan and Services In-house Referral: Clinical Social Work Discharge Planning Services: NA Post Acute Care Choice: Home Health Living arrangements for the past 2 months: Apartment                 DME Arranged: N/A DME Agency: NA       HH Arranged: PT, OT HH Agency: [facility] Date HH Agency Contacted: 02/24/23 Time HH Agency Contacted: 1558 Representative spoke with at [facility] Agency: Jon   Social Determinants of Health (SDOH) Interventions SDOH Screenings   Food Insecurity: No Food Insecurity (02/23/2023)  Housing: Low Risk  (02/23/2023)  Transportation Needs: No Transportation Needs (02/23/2023)  Utilities: Not At Risk (02/23/2023)  Alcohol  Screen: Low Risk  (01/10/2018)  Depression (PHQ2-9): Low Risk  (02/22/2023)  Financial Resource Strain: Low Risk  (08/25/2017)  Physical Activity: Insufficiently Active (08/25/2017)  Social Connections: Socially Integrated (02/23/2023)  Stress: No Stress Concern Present (08/25/2017)  Tobacco Use: Medium Risk (02/22/2023)    Readmission Risk Interventions    02/24/2023    3:56 PM  Readmission Risk Prevention Plan  Post Dischage Appt Complete  Medication Screening Complete  Transportation Screening Complete

## 2023-02-27 NOTE — Assessment & Plan Note (Addendum)
 E. coli urinary tract infection complicated with concurrent E. coli bacteremia No evidence of pyelonephritis on CT imaging. Continuing to treat with intravenous ceftriaxone Will need minimum of 7 days of intravenous antibiotics

## 2023-02-27 NOTE — Assessment & Plan Note (Addendum)
 Incidental finding of both gallbladder and liver abnormalities during initial hospitalization with findings concerning for malignancy with surrounding lymphadenopathy. After lengthy discussion with the patient she does not want to pursue further workup (MRI / contrast CT) as she will not proceed with treatment.  Patient wishes to explore palliative care and hospice options as she feels that she is rapidly physically declining.

## 2023-02-27 NOTE — Progress Notes (Signed)
 PROGRESS NOTE   [name]  FMW:[id number] DOB: 1935/05/29 DOA: 02/22/2023 PCP: Caro Harlene POUR, NP   Date of Service: the patient was seen and examined on 02/27/2023  Brief Narrative:  88 year old woman PMH alcoholism, cholelithiasis, hyperlipidemia, hypertension with complaints of malaise fatigue and decreased oral intake for the past 5 to 6 days.  Patient was sent to the [facility] emergency department identified by his primary care provider he was found to have developed an elevated creatinine of 2.4.    In the ED noted to have mild hyponatremia, AKI, suspected UTI, marked leukocytosis.  CT imaging of the abdomen pelvis revealed liver and gallbladder abnormalities as well as lymphadenopathy concerning for underlying malignancy.  And imaging abnormalities of the liver and gallbladder with lymphadenopathy concerning for malignancy.  For patient suspected urinary tract infection, patient was initiated on intravenous ceftriaxone .  Both blood and urine cultures came back positive for E. coli.  For management of both urinary tract infection and acute kidney injury patient was placed on intravenous fluids.  Renal function has been monitored closely with serial chemistries with the eventual plan to perform MRI of the liver with contrast for better evaluation of the liver lesions.   Assessment & Plan Acute renal failure superimposed on stage 3b chronic kidney disease (HCC) Creatinine seems to be leveling out Baseline creatinine previously 1.5. Continuing gentle intravenous fluids Monitoring renal function and electrolytes with serial chemistries Treating concurrent urinary tract infection which may be contributing. Dr. Geralynn with nephrology has been following.  His input is appreciated Strict input and output monitoring E-coli UTI E. coli urinary tract infection complicated with concurrent E. coli bacteremia No evidence of pyelonephritis on CT imaging. Continuing to treat with  intravenous ceftriaxone  Will need minimum of 7 days of intravenous antibiotics E coli bacteremia Please see assessment and plan above Normocytic anemia Substantial anemia without any clinical evidence of ongoing bleeding Likely a combination of anemia of chronic disease, iron  deficiency anemia, vitamin B12 deficiency and hemodilution Providing patient with iron  and vitamin B12 supplementation. Vitamin B12 deficiency Substantial vitamin B12 deficiency Providing patient with intramuscular vitamin B12 followed by daily vitamin B12 oral supplementation. Iron  deficiency anemia Daily oral ferrous sulfate  Abnormal CT scan, gallbladder Incidental finding of both gallbladder and liver abnormalities during initial hospitalization with findings concerning for malignancy with surrounding lymphadenopathy. After lengthy discussion with the patient she does not want to pursue further workup (MRI / contrast CT) as she will not proceed with treatment.  Patient wishes to explore palliative care and hospice options as she feels that she is rapidly physically declining.  Esophageal thrush (HCC) Early thrush noted on exam 1/10 with complaints of significant oral pain Receiving a 7-day course of Magic mouthwash Hyponatremia Resolved Goals of care, counseling/discussion Lengthy discussion with the patient concerning her gradual decline in overall performance status in the setting of incidental findings of gallbladder and liver abnormalities concerning for likely malignancy. Hepatobiliary malignancy would entail a poor prognosis and patient does not wish to pursue any further workup but she states she does not desire to pursue any potential treatments. Not exploring treatment options with certainly mean the patient's prognosis is poor Patient requesting hospice referral and evaluation.  Thing is how patient is still receiving intravenous fluids and antibiotics will obtain GIP bed evaluation. Patient is DNR      Subjective:  Patient states that her generalized weakness seems to be staying.  Patient still continues to complain of severe oral pain, burning in quality, worse  with attempts to eat.    Physical Exam:  Vitals:   02/26/23 0715 02/26/23 1508 02/26/23 1939 02/27/23 0358  BP:  135/68 (!) 145/68 133/69  Pulse:  81 82 90  Resp:  14 16 15   Temp:  98.5 F (36.9 C) 99.4 F (37.4 C) 98.3 F (36.8 C)  TempSrc:    Oral  SpO2:  98% 97% 92%  Weight: 49.4 kg     Height:        Constitutional: Awake alert and oriented x3, no associated distress.   Skin: no rashes, no lesions, poor skin turgor noted. Eyes: Pupils are equally reactive to light.  No evidence of scleral icterus or conjunctival pallor.  ENMT: Moist mucous membranes noted.  Posterior pharynx clear of any exudate or lesions.   Respiratory: clear to auscultation bilaterally, no wheezing, no crackles. Normal respiratory effort. No accessory muscle use.  Cardiovascular: Regular rate and rhythm, no murmurs / rubs / gallops. No extremity edema. 2+ pedal pulses. No carotid bruits.  Abdomen: Abdomen is soft and nontender.  No evidence of intra-abdominal masses.  Positive bowel sounds noted in all quadrants.   Musculoskeletal: No joint deformity upper and lower extremities. Good ROM, no contractures. Poor muscle tone.    Data Reviewed:  I have personally reviewed and interpreted labs, imaging.  Significant findings are   CBC: Recent Labs  Lab 02/22/23 1335 02/22/23 1927 02/23/23 0412 02/24/23 0520 02/25/23 0806 02/26/23 0542 02/27/23 0823  WBC 32.4*   < > 29.4* 23.9* 17.5* 14.5* 15.2*  NEUTROABS 28.3*  --   --   --  14.6* 11.9* 11.8*  HGB 9.5*   < > 8.5* 7.8* 8.0* 7.3* 8.1*  HCT 28.9*   < > 26.0* 25.3* 25.5* 22.5* 25.0*  MCV 81.9   < > 81.0 84.3 82.8 80.6 80.6  PLT 196   < > 237 279 391 479* 531*   < > = values in this interval not displayed.   Basic Metabolic Panel: Recent Labs  Lab 02/23/23 0412 02/24/23 0520  02/25/23 0806 02/26/23 0542 02/27/23 0823  NA 129* 128* 137 137 139  K 3.5 3.7 3.8 3.8 3.9  CL 103 103 109 113* 104  CO2 16* 15* 17* 17* 24  GLUCOSE 111* 130* 113* 100* 99  BUN 34* 39* 41* 33* 26*  CREATININE 2.62* 2.20* 2.45* 1.79* 1.75*  CALCIUM 7.2* 7.5* 7.9* 7.6* 8.0*  MG 2.1  --   --  1.7 1.8  PHOS  --   --   --  2.2* 2.2*   GFR: Estimated Creatinine Clearance: 18.3 mL/min (A) (by C-G formula based on SCr of 1.75 mg/dL (H)). Liver Function Tests: Recent Labs  Lab 02/22/23 1435 02/25/23 0806 02/26/23 0542 02/27/23 0823  AST 18 15 15 21   ALT 10 11 11 13   ALKPHOS 100 89 75 91  BILITOT 0.8 0.4 0.4 0.4  PROT 5.2* 5.2* 5.0* 5.6*  ALBUMIN 2.2* 2.0* 1.9* 2.2*    Coagulation Profile: Recent Labs  Lab 02/23/23 0412  INR 1.2    Code Status:  DNR.  Code status decision has been confirmed with: patient   Severity of Illness:  The appropriate patient status for this patient is INPATIENT. Inpatient status is judged to be reasonable and necessary in order to provide the required intensity of service to ensure the patient's safety. The patient's presenting symptoms, physical exam findings, and initial radiographic and laboratory data in the context of their chronic comorbidities is felt to place them at  high risk for further clinical deterioration. Furthermore, it is not anticipated that the patient will be medically stable for discharge from the hospital within 2 midnights of admission.   * I certify that at the point of admission it is my clinical judgment that the patient will require inpatient hospital care spanning beyond 2 midnights from the point of admission due to high intensity of service, high risk for further deterioration and high frequency of surveillance required.*  Time spent:  39 minutes  Author:  Zachary JINNY Ba MD  02/27/2023 11:04 AM

## 2023-02-27 NOTE — Progress Notes (Signed)
 Mobility Specialist - Progress Note   02/27/23 1537  Mobility  Activity Ambulated with assistance in hallway;Ambulated with assistance to bathroom  Level of Assistance Contact guard assist, steadying assist  Assistive Device Front wheel walker  Distance Ambulated (ft) 160 ft  Range of Motion/Exercises Active  Activity Response Tolerated well  Mobility Referral Yes  Mobility visit 1 Mobility  Mobility Specialist Start Time (ACUTE ONLY) 1525  Mobility Specialist Stop Time (ACUTE ONLY) 1537  Mobility Specialist Time Calculation (min) (ACUTE ONLY) 12 min   Pt was found in bed and agreeable to ambulate. Grew fatigued with session. At EOS returned to bed with all needs met. Call bell in reach and husband in room.  Erminio Leos Mobility Specialist

## 2023-02-28 DIAGNOSIS — R7881 Bacteremia: Secondary | ICD-10-CM | POA: Diagnosis not present

## 2023-02-28 DIAGNOSIS — R531 Weakness: Secondary | ICD-10-CM

## 2023-02-28 DIAGNOSIS — R932 Abnormal findings on diagnostic imaging of liver and biliary tract: Secondary | ICD-10-CM | POA: Diagnosis not present

## 2023-02-28 DIAGNOSIS — N390 Urinary tract infection, site not specified: Secondary | ICD-10-CM | POA: Diagnosis not present

## 2023-02-28 DIAGNOSIS — N179 Acute kidney failure, unspecified: Secondary | ICD-10-CM | POA: Diagnosis not present

## 2023-02-28 LAB — CBC WITH DIFFERENTIAL/PLATELET
Abs Immature Granulocytes: 0.31 10*3/uL — ABNORMAL HIGH (ref 0.00–0.07)
Basophils Absolute: 0.1 10*3/uL (ref 0.0–0.1)
Basophils Relative: 1 %
Eosinophils Absolute: 0.4 10*3/uL (ref 0.0–0.5)
Eosinophils Relative: 3 %
HCT: 23.2 % — ABNORMAL LOW (ref 36.0–46.0)
Hemoglobin: 7.1 g/dL — ABNORMAL LOW (ref 12.0–15.0)
Immature Granulocytes: 2 %
Lymphocytes Relative: 9 %
Lymphs Abs: 1.3 10*3/uL (ref 0.7–4.0)
MCH: 25.4 pg — ABNORMAL LOW (ref 26.0–34.0)
MCHC: 30.6 g/dL (ref 30.0–36.0)
MCV: 83.2 fL (ref 80.0–100.0)
Monocytes Absolute: 0.8 10*3/uL (ref 0.1–1.0)
Monocytes Relative: 6 %
Neutro Abs: 11.2 10*3/uL — ABNORMAL HIGH (ref 1.7–7.7)
Neutrophils Relative %: 79 %
Platelets: 556 10*3/uL — ABNORMAL HIGH (ref 150–400)
RBC: 2.79 MIL/uL — ABNORMAL LOW (ref 3.87–5.11)
RDW: 15.4 % (ref 11.5–15.5)
WBC: 14 10*3/uL — ABNORMAL HIGH (ref 4.0–10.5)
nRBC: 0 % (ref 0.0–0.2)

## 2023-02-28 LAB — COMPREHENSIVE METABOLIC PANEL
ALT: 12 U/L (ref 0–44)
AST: 20 U/L (ref 15–41)
Albumin: 2 g/dL — ABNORMAL LOW (ref 3.5–5.0)
Alkaline Phosphatase: 81 U/L (ref 38–126)
Anion gap: 8 (ref 5–15)
BUN: 20 mg/dL (ref 8–23)
CO2: 25 mmol/L (ref 22–32)
Calcium: 8 mg/dL — ABNORMAL LOW (ref 8.9–10.3)
Chloride: 104 mmol/L (ref 98–111)
Creatinine, Ser: 1.69 mg/dL — ABNORMAL HIGH (ref 0.44–1.00)
GFR, Estimated: 29 mL/min — ABNORMAL LOW (ref >60)
Glucose, Bld: 95 mg/dL (ref 70–99)
Potassium: 4.1 mmol/L (ref 3.5–5.1)
Sodium: 137 mmol/L (ref 135–145)
Total Bilirubin: 0.4 mg/dL (ref 0.0–1.2)
Total Protein: 5.1 g/dL — ABNORMAL LOW (ref 6.5–8.1)

## 2023-02-28 LAB — PHOSPHORUS: Phosphorus: 2.8 mg/dL (ref 2.5–4.6)

## 2023-02-28 LAB — MAGNESIUM: Magnesium: 1.8 mg/dL (ref 1.7–2.4)

## 2023-02-28 LAB — FOLATE: Folate: 15.4 ng/mL (ref >5.9)

## 2023-02-28 MED ORDER — VALACYCLOVIR HCL 500 MG PO TABS
1000.0000 mg | ORAL_TABLET | Freq: Two times a day (BID) | ORAL | Status: DC
  Administered 2023-02-28 – 2023-03-01 (×2): 1000 mg via ORAL
  Filled 2023-02-28 (×2): qty 2

## 2023-02-28 MED ORDER — CARMEX CLASSIC LIP BALM EX OINT: TOPICAL_OINTMENT | CUTANEOUS | Status: DC | PRN

## 2023-02-28 MED ORDER — MAGIC MOUTHWASH W/LIDOCAINE
15.0000 mL | Freq: Three times a day (TID) | ORAL | Status: DC
  Filled 2023-02-28 (×3): qty 15

## 2023-02-28 NOTE — Plan of Care (Signed)

## 2023-02-28 NOTE — Plan of Care (Signed)

## 2023-02-28 NOTE — Consult Note (Signed)
 Consultation Note Date: 02/28/2023   Patient Name: [name]  DOB: 1935-08-09  MRN: [id number]  Age / Sex: 88 y.o., female  PCP: Caro Harlene POUR, NP Referring Physician: Kenard Zachary PARAS, MD  Reason for Consultation: Establishing goals of care  HPI/Patient Profile: 88 y.o. female   admitted on 02/22/2023 with  PMH alcoholism/ sober for 35 years, cholelithiasis, hyperlipidemia, hypertension with complaints of malaise fatigue and decreased oral intake for the past 5 to 6 days.  Patient was sent to the [facility] emergency department identified by his primary care provider she was found to have developed an elevated creatinine of 2.4.     In the ED noted to have mild hyponatremia, AKI, suspected UTI, marked leukocytosis.  CT imaging of the abdomen pelvis revealed liver and gallbladder abnormalities as well as lymphadenopathy concerning for underlying malignancy.  And imaging abnormalities of the liver and gallbladder with lymphadenopathy concerning for malignancy.    Both blood and urine cultures came back positive for E. coli.    ,Patient faces treatment option decision advanced directive decisions and anticipatory care needs.s  Clinical Assessment and Goals of Care:  This NP Ronal Plants reviewed medical records, received report from team, assessed the patient and then meet at the patient's bedside along with her S0/ Gregorio Ort to discuss diagnosis, prognosis, GOC, EOL wishes disposition and options.   Concept of Palliative Care was introduced as specialized medical care for people and their families living with serious illness.  If focuses on providing relief from the symptoms and stress of a serious illness.  The goal is to improve quality of life for both the patient and the family.  Values and goals of care important to patient and family were attempted to be elicited.  Created space and  opportunity for patient  and family to explore thoughts and feelings regarding current medical situation.  Patient very clearly verbalizes an understanding that she is facing end-of-life decisions.  Mr. Has been active for many years in the community exploring human mortality and topics around death.  She is well-known to [employer] [facility] and [employer].  Although she has been discussing her own mortality for years, the fact that it is now facing her and eminent mortality  she tells me it is terrifying  She is secure in her decision to forego any further diagnostics or treatment, ultimately her hope is for comfort and dignity at end-of-life.  Education  on hospice benefit; philosophy and eligibility     A  discussion was had today regarding advanced directives.  Concepts specific to code status, artifical feeding and hydration, continued IV antibiotics and rehospitalization was had.    The difference between a aggressive medical intervention path  and a palliative comfort care path for this patient at this time was had.      MOST form introduced, patient does not want to complete.  She does provide an advanced directive which I copied for EMR   Natural trajectory and expectations at EOL were discussed.  Questions and concerns addressed.  Patient  encouraged to call with questions or concerns.     PMT will continue to support holistically.             HCPOADeborah Clinard Parker    SUMMARY OF RECOMMENDATIONS    Code Status/Advance Care Planning: DNR   Symptom Management:  Denies pain or discomfort education offered on utilization of medication for symptom management and end-of-life,   Palliative Prophylaxis:  Bowel Regimen and Frequent Pain Assessment  Additional Recommendations (Limitations, Scope, Preferences): Full Comfort Care  Psycho-social/Spiritual:  Desire for further Chaplaincy support:no Additional Recommendations: Education on Hospice  Prognosis:   < 6 months  Discharge Planning: To Be Determined      Primary Diagnoses: Present on Admission:  Acute renal failure superimposed on stage 3b chronic kidney disease (HCC)  Essential hypertension, benign  E-coli UTI  Volume depletion  Aortic atherosclerosis (HCC)  Normocytic anemia  Iron  deficiency anemia   I have reviewed the medical record, interviewed the patient and family, and examined the patient. The following aspects are pertinent.  Past Medical History:  Diagnosis Date   Age-related macular degeneration, dry, both eyes    Alcoholism (HCC)    Anemia    Anxiety    Cholelithiasis    Endometriosis    Goiter    Herpes simplex    Hyperlipidemia    Hypertension    Loss of weight    Nontoxic multinodular goiter    Other abnormal blood chemistry    PUD (peptic ulcer disease)    Renal cyst    Sciatica    Scoliosis (and kyphoscoliosis), idiopathic    Unspecified disorder of kidney and ureter    Urinary frequency    Social History   Socioeconomic History   Marital status: Divorced    Spouse name: Not on file   Number of children: 0   Years of education: Not on file   Highest education level: Not on file  Occupational History   Occupation: retired  Tobacco Use   Smoking status: Former    Current packs/day: 0.00    Average packs/day: 1 pack/day for 40.0 years (40.0 ttl pk-yrs)    Types: Cigarettes    Start date: 01/09/1954    Quit date: 01/09/1994    Years since quitting: 29.1   Smokeless tobacco: Never  Vaping Use   Vaping status: Never Used  Substance and Sexual Activity   Alcohol  use: No    Comment: 02/25/2017 recovering alcoholic since1990   Drug use: Never   Sexual activity: Not Currently  Other Topics Concern   Not on file  Social History Narrative   Lives alone at home   Caffeine: 1 large cup each morning    Social Drivers of Health   Financial Resource Strain: Low Risk  (08/25/2017)   Overall Financial Resource Strain (CARDIA)     Difficulty of Paying Living Expenses: Not hard at all  Food Insecurity: No Food Insecurity (02/23/2023)   Hunger Vital Sign    Worried About Running Out of Food in the Last Year: Never true    Ran Out of Food in the Last Year: Never true  Transportation Needs: No Transportation Needs (02/23/2023)   PRAPARE - Administrator, Civil Service (Medical): No    Lack of Transportation (Non-Medical): No  Physical Activity: Insufficiently Active (08/25/2017)   Exercise Vital Sign    Days of Exercise per Week: 1 day    Minutes of Exercise per Session: 30 min  Stress: No Stress Concern Present (08/25/2017)   [employer] of Occupational Health - Occupational Stress Questionnaire    Feeling of Stress : Only a little  Social Connections: Socially Integrated (02/23/2023)   Social Connection and Isolation Panel [NHANES]    Frequency of Communication with Friends and Family: More than three times a week    Frequency of Social Gatherings with Friends and Family: More than three times a week    Attends Religious Services: 1 to 4 times per year    Active Member of [employer] or Organizations: Yes    Attends Banker Meetings: Never    Marital Status: Living with partner   Family History  Problem Relation Age of Onset   Obesity Mother    Neuropathy Neg Hx    Colon cancer Neg Hx    Esophageal cancer Neg Hx    Pancreatic cancer Neg Hx    Stomach cancer Neg Hx    Scheduled Meds:  vitamin B-12  1,000 mcg Oral Daily   ferrous sulfate   325 mg Oral QAC breakfast   gabapentin   200 mg Oral QHS   heparin   5,000 Units Subcutaneous Q8H   LORazepam   0.5 mg Oral BID   magic mouthwash  15 mL Oral QID   mirabegron  ER  25 mg Oral Daily   pantoprazole   40 mg Oral Daily   sodium chloride  flush  3 mL Intravenous Q12H   Continuous Infusions:  cefTRIAXone  (ROCEPHIN )  IV 2 g (02/27/23 1429)   PRN Meds:.acetaminophen  **OR** acetaminophen , benzonatate , liver oil-zinc  oxide, menthol -cetylpyridinium,  ondansetron  **OR** ondansetron  (ZOFRAN ) IV, polyethylene glycol, polyvinyl alcohol , sodium chloride  flush, triamcinolone  cream Medications Prior to Admission:  Prior to Admission medications   Medication Sig Start Date End Date Taking? Authorizing Provider  amLODipine -valsartan  (EXFORGE ) 10-320 MG tablet TAKE 1 TABLET BY MOUTH EVERY DAY Patient taking differently: Take 1 tablet by mouth in the morning. 01/21/23  Yes Caro Harlene POUR, NP  ARTIFICIAL TEARS PF 0.1-0.3 % SOLN Place 1 drop into both eyes 4 (four) times daily as needed (for dryness or irritation).   Yes [provider]  conjugated estrogens (PREMARIN) vaginal cream Place 1 Application vaginally See admin instructions. Place a pea-sized amount vaginally once a day   Yes [provider]  CRANBERRY PO Take 2 capsules by mouth daily.   Yes [provider]  gabapentin  (NEURONTIN ) 300 MG capsule TAKE 1 CAPSULE(300 MG) BY MOUTH AT BEDTIME Patient taking differently: Take 300 mg by mouth at bedtime. 09/07/22  Yes Caro Harlene POUR, NP  GEMTESA  75 MG TABS Take 1 tablet (75 mg total) by mouth daily. 08/31/22  Yes Caro Harlene POUR, NP  liver oil-zinc  oxide (DESITIN) 40 % ointment Apply 1 Application topically as needed for irritation. 11/17/22  Yes [provider]  LORazepam  (ATIVAN ) 0.5 MG tablet TAKE 1 TABLET(0.5 MG) BY MOUTH TWICE DAILY- needs follow up prior to additional refills Patient taking differently: Take 0.5 mg by mouth in the morning and at bedtime. 02/01/23  Yes Caro Harlene POUR, NP  Multiple Vitamins-Minerals (PRESERVISION AREDS) CAPS Take 1 capsule by mouth 2 (two) times daily.   Yes [provider]  nebivolol  (BYSTOLIC ) 10 MG tablet TAKE 1/2 TABLET(5 MG) BY MOUTH DAILY Patient taking differently: Take 5 mg by mouth in the morning. 02/15/23  Yes Caro Harlene POUR, NP  omeprazole  (PRILOSEC) 20 MG capsule TAKE 1 CAPSULE(20 MG) BY MOUTH TWICE DAILY BEFORE A MEAL Patient taking  differently: Take 20 mg by mouth 2 (  two) times daily before a meal. 09/14/22  Yes Eubanks, Jessica K, NP  triamcinolone  cream (KENALOG ) 0.1 % Apply 1 Application topically 2 (two) times daily as needed (for itching).   Yes [provider]  psyllium (METAMUCIL SMOOTH TEXTURE) 58.6 % powder Take 1 packet by mouth daily. Patient not taking: Reported on 02/22/2023 11/23/22   Medina-Vargas, Monina C, NP   Allergies  Allergen Reactions   Cymbalta  [Duloxetine  Hcl] Diarrhea   Review of Systems  Neurological:  Positive for weakness.    Physical Exam Cardiovascular:     Rate and Rhythm: Normal rate.  Pulmonary:     Effort: Pulmonary effort is normal.  Skin:    General: Skin is warm and dry.  Neurological:     Mental Status: She is alert and oriented to person, place, and time.     Vital Signs: BP 137/62 (BP Location: Right Arm)   Pulse 82   Temp 98 F (36.7 C)   Resp 15   Ht 5' 4 (1.626 m)   Wt 49.4 kg   SpO2 96%   BMI 18.69 kg/m  Pain Scale: 0-10   Pain Score: 0-No pain   SpO2: SpO2: 96 % O2 Device:SpO2: 96 % O2 Flow Rate: .O2 Flow Rate (L/min): 2 L/min  IO: Intake/output summary:  Intake/Output Summary (Last 24 hours) at 02/28/2023 0925 Last data filed at 02/28/2023 9076 Gross per 24 hour  Intake 813.44 ml  Output 540 ml  Net 273.44 ml    LBM: Last BM Date : 02/27/23 Baseline Weight: Weight: 46.7 kg Most recent weight: Weight: 49.4 kg     Palliative Assessment/Data: 50 %      Time 75 minutes  Discussed with Dr. Kenard and bedside RN   Signed by: Ronal Plants, NP   Please contact Palliative Medicine Team phone at [phone] for questions and concerns.  For individual provider: See Tracey

## 2023-03-01 DIAGNOSIS — N1832 Chronic kidney disease, stage 3b: Secondary | ICD-10-CM | POA: Diagnosis not present

## 2023-03-01 DIAGNOSIS — N179 Acute kidney failure, unspecified: Secondary | ICD-10-CM | POA: Diagnosis not present

## 2023-03-01 LAB — MAGNESIUM: Magnesium: 1.8 mg/dL (ref 1.7–2.4)

## 2023-03-01 MED ORDER — VALACYCLOVIR HCL 1 G PO TABS: 1000.0000 mg | ORAL_TABLET | Freq: Every day | ORAL | 0 refills | Status: AC

## 2023-03-01 MED ORDER — FERROUS SULFATE 325 (65 FE) MG PO TABS: 325.0000 mg | ORAL_TABLET | Freq: Every day | ORAL | 0 refills | Status: AC

## 2023-03-01 MED ORDER — MENTHOL 3 MG MT LOZG: 1.0000 | LOZENGE | OROMUCOSAL | 12 refills | Status: DC | PRN

## 2023-03-01 MED ORDER — BENZONATATE 100 MG PO CAPS: 100.0000 mg | ORAL_CAPSULE | Freq: Two times a day (BID) | ORAL | 0 refills | Status: DC | PRN

## 2023-03-01 MED ORDER — CYANOCOBALAMIN 1000 MCG PO TABS: 1000.0000 ug | ORAL_TABLET | Freq: Every day | ORAL | 0 refills | Status: DC

## 2023-03-01 MED ORDER — MAGIC MOUTHWASH W/LIDOCAINE: 15.0000 mL | Freq: Three times a day (TID) | ORAL | 0 refills | Status: AC

## 2023-03-01 NOTE — Assessment & Plan Note (Signed)
 Incidental finding of both gallbladder and liver abnormalities during initial hospitalization with findings concerning for malignancy with surrounding lymphadenopathy. After lengthy discussion with the patient she does not want to pursue further workup (MRI / contrast CT) as she will not proceed with treatment.   Comfort care options being pursued, likely to be discharged with home-going hospice on 1/13.

## 2023-03-01 NOTE — Progress Notes (Signed)
 Speech Language Pathology Treatment: Dysphagia  Patient Details Name: [name] MRN: [id number] DOB: [birth year] Today's Date: 03/01/2023 Time: 8961-8952 SLP Time Calculation (min) (ACUTE ONLY): 9 min  Assessment / Plan / Recommendation Clinical Impression  Pt seen for follow up SLP session to assess diet tolerance and review compensatory swallow strategies recommended following MBSS. Pt reported good appetite today and consumed 100% of breakfast per EMR. She denied current swallowing concerns. She is aware to swallow x2 in effort to clear pharyngeal residue that was observed on MBSS. She swallow consecutive straw sips of thin liquids with no overt or subtle s/s of aspiration observed.   No further SLP needs identified at this time. SLP will sign off. Please re-consult if pt exhibits concerns for aspiration with PO intake.    HPI HPI: 88yo female admitted 02/22/23 with abnormal renal function, malaise, fatigue, decreased PO intake x5-6 days due to AKI and UTI. PMH: alcoholism, cholelithiasis, HLD, HTN, macular degeneration, anxiety. CXR = no acute dz, CT = L>R BLL consolidation suspicious for aspiration; SLP recommended Reg/thin diet during BSE with MBS completion to r/o aspiration.      SLP Plan  All goals met;Discharge SLP treatment due to (comment)      Recommendations for follow up therapy are one component of a multi-disciplinary discharge planning process, led by the attending physician.  Recommendations may be updated based on patient status, additional functional criteria and insurance authorization.    Recommendations  Diet recommendations: Regular;Thin liquid Medication Administration: Whole meds with liquid Compensations: Minimize environmental distractions;Slow rate;Small sips/bites                  Oral care BID     Dysphagia, oropharyngeal phase (R13.12)     All goals met;Discharge SLP treatment due to (comment)     [name]  03/01/2023, 11:20 AM

## 2023-03-01 NOTE — Assessment & Plan Note (Signed)
 Substantial vitamin B12 deficiency

## 2023-03-01 NOTE — Progress Notes (Addendum)
 PROGRESS NOTE   [name]  FMW:[id number] DOB: Jan 04, 1936 DOA: 02/22/2023 PCP: Caro Harlene POUR, NP   Date of Service: the patient was seen and examined on 02/28/2023  Brief Narrative:  88 year old woman PMH alcoholism, cholelithiasis, hyperlipidemia, hypertension with complaints of malaise fatigue and decreased oral intake for the past 5 to 6 days.  Patient was sent to the [facility] emergency department identified by his primary care provider he was found to have developed an elevated creatinine of 2.4.    In the ED noted to have mild hyponatremia, AKI, suspected UTI, marked leukocytosis.  CT imaging of the abdomen pelvis revealed liver and gallbladder abnormalities as well as lymphadenopathy concerning for underlying malignancy.  And imaging abnormalities of the liver and gallbladder with lymphadenopathy concerning for malignancy.  For patient suspected urinary tract infection, patient was initiated on intravenous ceftriaxone .  Both blood and urine cultures came back positive for E. coli.  For management of both urinary tract infection and acute kidney injury patient was placed on intravenous fluids.  Dr. Geralynn with nephrology was consulted.  Renal injury did improve over the first several days of hospitalization with intravenous fluids and antibiotics.  Concerning the patient's findings of likely malignancy on CT imaging after lengthy discussions with the patient about goals of care she wished to not proceed with further workup or any potential treatment.  She wished to instead transition to comfort measures with plan for patient to likely be discharged home with home-going hospice on 1/14.   Assessment & Plan Acute renal failure superimposed on stage 3b chronic kidney disease (HCC) Creatinine 1.69 Baseline creatinine previously 1.5. Discontinuing fluids Treating concurrent urinary tract infection which may be contributing. Dr. Geralynn with nephrology has been following and has  signed off.  His input is appreciated. E-coli UTI E. coli urinary tract infection complicated with concurrent E. coli bacteremia No evidence of pyelonephritis on CT imaging. Patient has received 6 days of intravenous ceftriaxone , antibiotics have been stopped 1/12. E coli bacteremia Please see assessment and plan above Normocytic anemia Substantial anemia without any clinical evidence of ongoing bleeding Likely a combination of anemia of chronic disease, iron  deficiency anemia, vitamin B12 deficiency and hemodilution  Vitamin B12 deficiency Substantial vitamin B12 deficiency Iron  deficiency anemia Supportive care Abnormal CT scan, gallbladder Incidental finding of both gallbladder and liver abnormalities during initial hospitalization with findings concerning for malignancy with surrounding lymphadenopathy. After lengthy discussion with the patient she does not want to pursue further workup (MRI / contrast CT) as she will not proceed with treatment.   Comfort care options being pursued, likely to be discharged with home-going hospice on 1/13. Herpetic gingivostomatitis Originally thought to be oral thrush but now with blistering of the tongue presentation is more consistent with herpetic gingivostomatitis  Changing regular Magic mouthwash to Magic mouthwash with lidocaine .   Initiating valacyclovir  twice daily for 7 days.  Hyponatremia Resolved Goals of care, counseling/discussion Lengthy discussion with the patient concerning her gradual decline in overall performance status in the setting of incidental findings of gallbladder and liver abnormalities concerning for likely malignancy. Hepatobiliary malignancy would entail a poor prognosis and patient does not wish to pursue any further workup but she states she does not desire to pursue any potential treatments. Not exploring treatment options with certainly mean the patient's prognosis is poor Palliative care has been consulted, their  involvement in the case is appreciated. Plan is for patient to be discharged home with home-going hospice on 1/13. Patient is DNR  Subjective:  Patient complaining of severe oral pain, sharp in quality, unchanged for the past 2 days.  Patient reports that the Magic mouthwash that she has received at this point has made her symptoms worse.  Patient is complaining of increasing blistering on her tongue.    Physical Exam:  Vitals:   02/27/23 1934 02/28/23 0438 02/28/23 1332 02/28/23 2052  BP: (!) 143/72 137/62 (!) 160/81 (!) 161/73  Pulse: 84 82 90 92  Resp: 18 15 20    Temp: 99.5 F (37.5 C) 98 F (36.7 C) 98.9 F (37.2 C) 98.4 F (36.9 C)  TempSrc: Oral  Oral Oral  SpO2: 99% 96% 93% (!) 88%  Weight:      Height:        Constitutional: Awake alert and oriented x3, no associated distress.   Skin: no rashes, no lesions, poor skin turgor noted. Eyes: Pupils are equally reactive to light.  No evidence of scleral icterus or conjunctival pallor.  ENMT: Developing angular cheilitis and blistering of the superior surface of the tongue no evidence of exudate or lesions. Respiratory: clear to auscultation bilaterally, no wheezing, no crackles. Normal respiratory effort. No accessory muscle use.  Cardiovascular: Regular rate and rhythm, no murmurs / rubs / gallops. No extremity edema. 2+ pedal pulses. No carotid bruits.  Abdomen: Abdomen is soft and nontender.  No evidence of intra-abdominal masses.  Positive bowel sounds noted in all quadrants.   Musculoskeletal: No joint deformity upper and lower extremities. Good ROM, no contractures. Poor muscle tone.    Data Reviewed:  I have personally reviewed and interpreted labs, imaging.  Significant findings are   CBC: Recent Labs  Lab 02/22/23 1335 02/22/23 1927 02/24/23 0520 02/25/23 0806 02/26/23 0542 02/27/23 0823 02/28/23 0549  WBC 32.4*   < > 23.9* 17.5* 14.5* 15.2* 14.0*  NEUTROABS 28.3*  --   --  14.6* 11.9* 11.8* 11.2*   HGB 9.5*   < > 7.8* 8.0* 7.3* 8.1* 7.1*  HCT 28.9*   < > 25.3* 25.5* 22.5* 25.0* 23.2*  MCV 81.9   < > 84.3 82.8 80.6 80.6 83.2  PLT 196   < > 279 391 479* 531* 556*   < > = values in this interval not displayed.   Basic Metabolic Panel: Recent Labs  Lab 02/23/23 0412 02/24/23 0520 02/25/23 0806 02/26/23 0542 02/27/23 0823 02/28/23 0549  NA 129* 128* 137 137 139 137  K 3.5 3.7 3.8 3.8 3.9 4.1  CL 103 103 109 113* 104 104  CO2 16* 15* 17* 17* 24 25  GLUCOSE 111* 130* 113* 100* 99 95  BUN 34* 39* 41* 33* 26* 20  CREATININE 2.62* 2.20* 2.45* 1.79* 1.75* 1.69*  CALCIUM 7.2* 7.5* 7.9* 7.6* 8.0* 8.0*  MG 2.1  --   --  1.7 1.8 1.8  PHOS  --   --   --  2.2* 2.2* 2.8   GFR: Estimated Creatinine Clearance: 19 mL/min (A) (by C-G formula based on SCr of 1.69 mg/dL (H)). Liver Function Tests: Recent Labs  Lab 02/22/23 1435 02/25/23 0806 02/26/23 0542 02/27/23 0823 02/28/23 0549  AST 18 15 15 21 20   ALT 10 11 11 13 12   ALKPHOS 100 89 75 91 81  BILITOT 0.8 0.4 0.4 0.4 0.4  PROT 5.2* 5.2* 5.0* 5.6* 5.1*  ALBUMIN 2.2* 2.0* 1.9* 2.2* 2.0*    Coagulation Profile: Recent Labs  Lab 02/23/23 0412  INR 1.2    Code Status:  DNR.  Code status decision has  been confirmed with: patient   Severity of Illness:  The appropriate patient status for this patient is INPATIENT. Inpatient status is judged to be reasonable and necessary in order to provide the required intensity of service to ensure the patient's safety. The patient's presenting symptoms, physical exam findings, and initial radiographic and laboratory data in the context of their chronic comorbidities is felt to place them at high risk for further clinical deterioration. Furthermore, it is not anticipated that the patient will be medically stable for discharge from the hospital within 2 midnights of admission.   * I certify that at the point of admission it is my clinical judgment that the patient will require inpatient  hospital care spanning beyond 2 midnights from the point of admission due to high intensity of service, high risk for further deterioration and high frequency of surveillance required.*  Time spent:  38 minutes  Author:  Zachary JINNY Ba MD  03/01/2023 1:24 AM

## 2023-03-01 NOTE — Assessment & Plan Note (Signed)
 Originally thought to be oral thrush but now with blistering of the tongue presentation is more consistent with herpetic gingivostomatitis  Changing regular Magic mouthwash to Magic mouthwash with lidocaine .   Initiating valacyclovir  twice daily for 7 days.

## 2023-03-01 NOTE — Assessment & Plan Note (Signed)
 Lengthy discussion with the patient concerning her gradual decline in overall performance status in the setting of incidental findings of gallbladder and liver abnormalities concerning for likely malignancy. Hepatobiliary malignancy would entail a poor prognosis and patient does not wish to pursue any further workup but she states she does not desire to pursue any potential treatments. Not exploring treatment options with certainly mean the patient's prognosis is poor Palliative care has been consulted, their involvement in the case is appreciated. Plan is for patient to be discharged home with home-going hospice on 1/13. Patient is DNR

## 2023-03-01 NOTE — Assessment & Plan Note (Signed)
 Resolved

## 2023-03-01 NOTE — Discharge Summary (Signed)
 Physician Discharge Summary  [name] FMW:[id number] DOB: 1935-09-26 DOA: 02/22/2023  PCP: Caro Harlene POUR, NP  Admit date: 02/22/2023 Discharge date: 03/01/2023 30 Day Unplanned Readmission Risk Score    Flowsheet Row ED to Hosp-Admission (Current) from 02/22/2023 in [facility]  30 Day Unplanned Readmission Risk Score (%) 14.16 Filed at 03/01/2023 0801       This score is the patient's risk of an unplanned readmission within 30 days of being discharged (0 -100%). The score is based on dignosis, age, lab data, medications, orders, and past utilization.   Low:  0-14.9   Medium: 15-21.9   High: 22-29.9   Extreme: 30 and above          Admitted From: Home Disposition: Home with hospice  Recommendations for Outpatient Follow-up:  Follow up with PCP in 1-2 weeks Please obtain BMP/CBC in one week Please follow up with your PCP on the following pending results: Unresulted Labs (From admission, onward)     Start     Ordered   02/26/23 0500  Magnesium   Daily,   R      02/25/23 0746              Home Health: None Equipment/Devices: None  Discharge Condition: Stable CODE STATUS: DNR Diet recommendation: Cardiac  Subjective: Seen and examined.  Husband at bedside.  Patient has no complaints and she is excited to go home today.  Brief/Interim Summary: 88 year old woman PMH alcoholism, cholelithiasis, hyperlipidemia, hypertension with complaints of malaise fatigue and decreased oral intake for the past 5 to 6 days.  Patient was sent to the [facility] emergency department identified by his primary care provider he was found to have developed an elevated creatinine of 2.4.     In the ED noted to have mild hyponatremia, AKI, suspected UTI, marked leukocytosis.  CT imaging of the abdomen pelvis revealed liver and gallbladder abnormalities as well as lymphadenopathy concerning for underlying malignancy.  And imaging abnormalities of the liver  and gallbladder with lymphadenopathy concerning for malignancy.patient was initiated on intravenous ceftriaxone .  Both blood and urine cultures came back positive for E. coli.  For management of both urinary tract infection and acute kidney injury patient was placed on intravenous fluids.  She completed antibiotics while in the hospital for 7 days.   Dr. Geralynn with nephrology was consulted.  Renal injury did improve over the first several days of hospitalization with intravenous fluids and antibiotics.   Concerning the patient's findings of likely malignancy on CT imaging after lengthy discussions with the patient about goals of care she wished to not proceed with further workup or any potential treatment.  She wished to instead transition to comfort measures 02/28/2023, seen by palliative and hospice, plan was formulated for her to go home with hospice today.  Further details below.     Assessment & Plan Acute renal failure superimposed on stage 3b chronic kidney disease (HCC) Creatinine 1.69 Baseline creatinine previously 1.5.  Nephrology signed off.    E-coli UTI E. coli urinary tract infection complicated with concurrent E. coli bacteremia No evidence of pyelonephritis on CT imaging. Patient has received 6 days of intravenous ceftriaxone , antibiotics have been stopped 1/12. E coli bacteremia Please see assessment and plan above Normocytic anemia Substantial anemia without any clinical evidence of ongoing bleeding Likely a combination of anemia of chronic disease, iron  deficiency anemia, vitamin B12 deficiency and hemodilution   Vitamin B12 deficiency Substantial vitamin B12 deficiency, prescribed  to her at discharge. Iron  deficiency anemia Supportive care Abnormal CT scan, gallbladder Incidental finding of both gallbladder and liver abnormalities during initial hospitalization with findings concerning for malignancy with surrounding lymphadenopathy. After lengthy discussion with the  patient she does not want to pursue further workup (MRI / contrast CT) as she will not proceed with treatment.   Comfort care options being pursued Herpetic gingivostomatitis Originally thought to be oral thrush but now with blistering of the tongue presentation is more consistent with herpetic gingivostomatitis  Changing regular Magic mouthwash to Magic mouthwash with lidocaine .   Initiated on valacyclovir .  Discharging on 1 g p.o. daily based on the creatinine clearance which is 9.  Discussed with pharmacy about the dosage. Hyponatremia Resolved Goals of care, counseling/discussion Lengthy discussion with the patient concerning her gradual decline in overall performance status in the setting of incidental findings of gallbladder and liver abnormalities concerning for likely malignancy. Hepatobiliary malignancy would entail a poor prognosis and patient does not wish to pursue any further workup but she states she does not desire to pursue any potential treatments. Not exploring treatment options with certainly mean the patient's prognosis is poor Patient is being discharged home with hospice.  Discharge plan was discussed with patient and/or family member and they verbalized understanding and agreed with it.  Discharge Diagnoses:  Principal Problem:   Acute renal failure superimposed on stage 3b chronic kidney disease (HCC) Active Problems:   Essential hypertension, benign   Goals of care, counseling/discussion   Iron  deficiency anemia   Normocytic anemia   E-coli UTI   Aortic atherosclerosis (HCC)   Volume depletion   Generalized weakness   Hyponatremia   E coli bacteremia   Abnormal CT scan, gallbladder   Herpetic gingivostomatitis   Vitamin B12 deficiency    Discharge Instructions   Allergies as of 03/01/2023       Reactions   Cymbalta  [duloxetine  Hcl] Diarrhea        Medication List     STOP taking these medications    Metamucil Smooth Texture 58.6 %  powder Generic drug: psyllium       TAKE these medications    amLODipine -valsartan  10-320 MG tablet Commonly known as: EXFORGE  TAKE 1 TABLET BY MOUTH EVERY DAY What changed: when to take this   Artificial Tears PF 0.1-0.3 % Soln Generic drug: Dextran 70-Hypromellose (PF) Place 1 drop into both eyes 4 (four) times daily as needed (for dryness or irritation).   benzonatate  100 MG capsule Commonly known as: TESSALON  Take 1 capsule (100 mg total) by mouth 2 (two) times daily as needed for cough.   CRANBERRY PO Take 2 capsules by mouth daily.   cyanocobalamin  1000 MCG tablet Take 1 tablet (1,000 mcg total) by mouth daily. Start taking on: March 02, 2023   ferrous sulfate  325 (65 FE) MG tablet Take 1 tablet (325 mg total) by mouth daily before breakfast. Start taking on: March 02, 2023   gabapentin  300 MG capsule Commonly known as: NEURONTIN  TAKE 1 CAPSULE(300 MG) BY MOUTH AT BEDTIME What changed: See the new instructions.   Gemtesa  75 MG Tabs Generic drug: Vibegron  Take 1 tablet (75 mg total) by mouth daily.   liver oil-zinc  oxide 40 % ointment Commonly known as: DESITIN Apply 1 Application topically as needed for irritation.   LORazepam  0.5 MG tablet Commonly known as: ATIVAN  TAKE 1 TABLET(0.5 MG) BY MOUTH TWICE DAILY- needs follow up prior to additional refills What changed:  how much to take how to  take this when to take this additional instructions   magic mouthwash w/lidocaine  Soln Take 15 mLs by mouth 3 (three) times daily for 10 days. Suspension contains equal amounts of Maalox Extra Strength, nystatin , diphenhydramine  and lidocaine .   menthol -cetylpyridinium 3 MG lozenge Commonly known as: CEPACOL Take 1 lozenge (3 mg total) by mouth as needed for sore throat.   nebivolol  10 MG tablet Commonly known as: BYSTOLIC  TAKE 1/2 TABLET(5 MG) BY MOUTH DAILY What changed: See the new instructions.   omeprazole  20 MG capsule Commonly known as:  PRILOSEC TAKE 1 CAPSULE(20 MG) BY MOUTH TWICE DAILY BEFORE A MEAL What changed: See the new instructions.   Premarin vaginal cream Generic drug: conjugated estrogens Place 1 Application vaginally See admin instructions. Place a pea-sized amount vaginally once a day   PreserVision AREDS Caps Take 1 capsule by mouth 2 (two) times daily.   triamcinolone  cream 0.1 % Commonly known as: KENALOG  Apply 1 Application topically 2 (two) times daily as needed (for itching).   valACYclovir  1000 MG tablet Commonly known as: VALTREX  Take 1 tablet (1,000 mg total) by mouth daily for 6 days.        Follow-up Information     [facility] [place], [place] Follow up.   Why: [facility] will follow up with you at discharge to provide home health services Contact information: [address] [place] [address] [phone]         Caro Harlene POUR, NP Follow up in 1 week(s).   Specialty: Geriatric Medicine Contact information: [address] [place] [address] [phone]                Allergies  Allergen Reactions   Cymbalta  [Duloxetine  Hcl] Diarrhea    Consultations: Palliative care, nephrology   Procedures/Studies: VAS US  RENAL ARTERY DUPLEX Result Date: 02/28/2023 ABDOMINAL VISCERAL Patient Name:  [name]  Date of Exam:   02/27/2023 Medical Rec #: [id number]  Accession #:    [id number] Date of Birth: Jul 21, 1935  Patient Gender: F Patient Age:   88 years Exam Location:  [facility] Procedure:      VAS US  RENAL ARTERY DUPLEX Referring Phys: [id number] ALEXANDRA DEZII -------------------------------------------------------------------------------- Indications: Hypertension High Risk Factors: Hypertension, hyperlipidemia. Comparison Study: None. Performing Technologist: Garnette Rockers  Examination Guidelines: A complete evaluation includes B-mode imaging, spectral Doppler, color Doppler, and power Doppler as needed of all accessible  portions of each vessel. Bilateral testing is considered an integral part of a complete examination. Limited examinations for reoccurring indications may be performed as noted.  Duplex Findings: +--------------------+--------+--------+------+--------+ Mesenteric          PSV cm/sEDV cm/sPlaqueComments +--------------------+--------+--------+------+--------+ Aorta Mid             149      21                  +--------------------+--------+--------+------+--------+ Celiac Artery Origin  201                          +--------------------+--------+--------+------+--------+ SMA Origin            169      0                   +--------------------+--------+--------+------+--------+    +------------------+--------+--------+-------+ Right Renal ArteryPSV cm/sEDV cm/sComment +------------------+--------+--------+-------+ Origin              178  24           +------------------+--------+--------+-------+ Proximal            139      21           +------------------+--------+--------+-------+ Mid                  46      10           +------------------+--------+--------+-------+ Distal               46      2            +------------------+--------+--------+-------+ +-----------------+--------+--------+-------+ Left Renal ArteryPSV cm/sEDV cm/sComment +-----------------+--------+--------+-------+ Origin             298      28           +-----------------+--------+--------+-------+ Proximal           235      32           +-----------------+--------+--------+-------+ Mid                147      44           +-----------------+--------+--------+-------+ Distal              69      13           +-----------------+--------+--------+-------+ +------------+--------+--------+----+-----------+--------+--------+----+ Right KidneyPSV cm/sEDV cm/sRI  Left KidneyPSV cm/sEDV cm/sRI    +------------+--------+--------+----+-----------+--------+--------+----+ Upper Pole  24      5       0.79Upper Pole 37      7       0.81 +------------+--------+--------+----+-----------+--------+--------+----+ Mid         40      7       0.        33      7       0.79 +------------+--------+--------+----+-----------+--------+--------+----+ Lower Pole  26      6       0.77Lower Pole 19      4       0.79 +------------+--------+--------+----+-----------+--------+--------+----+ Hilar       41      10      0.76Hilar      43      7       0.84 +------------+--------+--------+----+-----------+--------+--------+----+ +------------------+----+------------------+-----+ Right Kidney          Left Kidney             +------------------+----+------------------+-----+ RAR                   RAR                     +------------------+----+------------------+-----+ RAR (manual)      1.19RAR (manual)      2.00  +------------------+----+------------------+-----+ Cortex                Cortex                  +------------------+----+------------------+-----+ Cortex thickness      Corex thickness         +------------------+----+------------------+-----+ Kidney length (cm)9.88Kidney length (cm)10.20 +------------------+----+------------------+-----+  Summary: Renal:  Right: Normal size right kidney. 1-59% stenosis of the right renal        artery. RRV flow present. Normal right Resisitive Index. Left:  Evidence of a > 60% stenosis in the left renal artery. LRV  flow present. Normal size of left kidney. Abnormal left        Resisitve Index. Mesenteric: Normal Celiac artery and Superior Mesenteric artery findings.  *See table(s) above for measurements and observations.  Diagnosing physician: Penne Colorado MD  Electronically signed by Penne Colorado MD on 02/28/2023 at 7:23:59 AM.    Final    DG Swallowing Func-Speech Pathology Result Date: 02/25/2023 Table  formatting from the original result was not included. Modified Barium Swallow Study Patient Details Name: [name] MRN: [id number] Date of Birth: 10/19/1935 Today's Date: 02/25/2023 HPI/PMH: HPI: 88yo female admitted 02/22/23 with abnormal renal function, malaise, fatigue, decreased PO intake x5-6 days due to AKI and UTI. PMH: alcoholism, cholelithiasis, HLD, HTN, macular degeneration, anxiety. CXR = no acute dz, CT = L>R BLL consolidation suspicious for aspiration; SLP recommended Reg/thin diet during BSE with MBS completion to r/o aspiration. Clinical Impression: Clinical Impression: Pt demonstrated mild oropharyngeal dysphagia c/b trace pharyngeal residue on tongue base and valleculae d/t decreased tongue base retraction which cleared with subsequent swallow.  This may be d/t xerostomia as pt did exhibit a dry oral cavity prior to study and stated a need for frequent water  most days.  Oral preparation adequate, brisk swallow response noted and no aspiration/penetration observed throughout study.  Slight trace residue in pyriform sinuses may suggest CP bar dysfunction intermittently and could contribute to esophageal phase of swallow, but compensatory strategy of repetitive swallow cleared all material from pyriform sinuses post swallow.  Pt is deconditioned (with new oxygen requirement) which may also impact cumulative oropharyngeal swallow.  Pt did exhibit slight anterior loss of thin with decreased awareness post study, but this only occurred after pill/thin liquid trial.  Pt denied dysphagia prior to study.  Discussed results with patient and she is willing to utilize compensatory strategy of repetitive swallows intermittently during meals.  Recommend continue current diet of Regular/thin liquids with compensatory strategies to assist with xerostomia/pharyngeal residue during po intake. Thank you for this consult. Factors that may increase risk of adverse event in presence of aspiration Noe & Lianne 2021):  Factors that may increase risk of adverse event in presence of aspiration Noe & Lianne 2021): Poor general health and/or compromised immunity; Reduced cognitive function; Frail or deconditioned; Weak cough Recommendations/Plan: Swallowing Evaluation Recommendations Swallowing Evaluation Recommendations Recommendations: PO diet PO Diet Recommendation: Regular; Thin liquids (Level 0) Liquid Administration via: Cup; Straw Medication Administration: Whole meds with liquid (larger pills in cohesive material (ie: pudding, yogurt) prn) Supervision: Patient able to self-feed; Intermittent supervision/cueing for swallowing strategies Swallowing strategies  : Slow rate; Small bites/sips; Multiple dry swallows after each bite/sip; Follow solids with liquids Postural changes: Position pt fully upright for meals; Stay upright 30-60 min after meals Oral care recommendations: Oral care BID (2x/day) Recommended consults: -- (May consider an esophageal assessment if symptoms continue related to GERD/PNA) Treatment Plan Treatment Plan Treatment recommendations: Therapy as outlined in treatment plan below Follow-up recommendations: No SLP follow up Recommendations Comment: additional goals/plan of care pending MBS Functional status assessment: Patient has had a recent decline in their functional status and demonstrates the ability to make significant improvements in function in a reasonable and predictable amount of time. Treatment frequency: Min 1x/week Treatment duration: 1 week Interventions: Aspiration precaution training; Compensatory techniques; Patient/family education; Diet toleration management by SLP Recommendations Recommendations for follow up therapy are one component of a multi-disciplinary discharge planning process, led by the attending physician.  Recommendations may be updated based on patient status, additional functional criteria  and insurance authorization. Assessment: Orofacial Exam: Orofacial Exam Oral  Cavity: Oral Hygiene: Xerostomia Oral Cavity - Dentition: Adequate natural dentition Orofacial Anatomy: WFL Oral Motor/Sensory Function: WFL Anatomy: Anatomy: Other (Comment) (? CP bar involvement) Boluses Administered: Boluses Administered Boluses Administered: Thin liquids (Level 0); Mildly thick liquids (Level 2, nectar thick); Moderately thick liquids (Level 3, honey thick); Puree; Solid  Oral Impairment Domain: Oral Impairment Domain Lip Closure: Interlabial escape, no progression to anterior lip Tongue control during bolus hold: Cohesive bolus between tongue to palatal seal (slight decreased coordination) Bolus preparation/mastication: Timely and efficient chewing and mashing Bolus transport/lingual motion: Brisk tongue motion Oral residue: Trace residue lining oral structures Location of oral residue : Tongue Initiation of pharyngeal swallow : Posterior angle of the ramus  Pharyngeal Impairment Domain: Pharyngeal Impairment Domain Soft palate elevation: No bolus between soft palate (SP)/pharyngeal wall (PW) Laryngeal elevation: Complete superior movement of thyroid  cartilage with complete approximation of arytenoids to epiglottic petiole Anterior hyoid excursion: Complete anterior movement Epiglottic movement: Complete inversion Laryngeal vestibule closure: Complete, no air/contrast in laryngeal vestibule Pharyngeal stripping wave : Present - complete Pharyngeal contraction (A/P view only): N/A Pharyngoesophageal segment opening: Partial distention/partial duration, partial obstruction of flow Tongue base retraction: Trace column of contrast or air between tongue base and PPW Pharyngeal residue: Trace residue within or on pharyngeal structures Location of pharyngeal residue: Valleculae; Pyriform sinuses; Tongue base  Esophageal Impairment Domain: Esophageal Impairment Domain Esophageal clearance upright position: Complete clearance, esophageal coating Pill: Pill Consistency administered: Thin liquids (Level  0) Thin liquids (Level 0): Impaired (see clinical impressions) Penetration/Aspiration Scale Score: Penetration/Aspiration Scale Score 1.  Material does not enter airway: Thin liquids (Level 0); Mildly thick liquids (Level 2, nectar thick); Moderately thick liquids (Level 3, honey thick); Puree; Solid; Pill Compensatory Strategies: Compensatory Strategies Compensatory strategies: Yes Multiple swallows: Effective Effective Multiple Swallows: Thin liquid (Level 0); Puree; Pill; Solid   General Information: No data recorded Diet Prior to this Study: Regular; Thin liquids (Level 0)   Temperature : Normal   Respiratory Status: WFL   Supplemental O2: Nasal cannula   History of Recent Intubation: No  Behavior/Cognition: Alert; Cooperative Self-Feeding Abilities: Able to self-feed Baseline vocal quality/speech: Other (comment) (slightly hoarse) Volitional Cough: Able to elicit Volitional Swallow: Able to elicit Exam Limitations: No limitations Goal Planning: Prognosis for improved oropharyngeal function: Good No data recorded No data recorded Patient/Family Stated Goal: to have a bath and hair wash Consulted and agree with results and recommendations: Patient Pain: Pain Assessment Pain Assessment: No/denies pain End of Session: Start Time:SLP Start Time (ACUTE ONLY): 1245 Stop Time: SLP Stop Time (ACUTE ONLY): 1305 Time Calculation:SLP Time Calculation (min) (ACUTE ONLY): 20 min Charges: SLP Evaluations $ SLP Speech Visit: 1 Visit SLP Evaluations $BSS Swallow: 1 Procedure $MBS Swallow: 1 Procedure SLP visit diagnosis: SLP Visit Diagnosis: Dysphagia, oropharyngeal phase (R13.12) Past Medical History: Past Medical History: Diagnosis Date  Age-related macular degeneration, dry, both eyes   Alcoholism (HCC)   Anemia   Anxiety   Cholelithiasis   Endometriosis   Goiter   Herpes simplex   Hyperlipidemia   Hypertension   Loss of weight   Nontoxic multinodular goiter   Other abnormal blood chemistry   PUD (peptic ulcer disease)    Renal cyst   Sciatica   Scoliosis (and kyphoscoliosis), idiopathic   Unspecified disorder of kidney and ureter   Urinary frequency  Past Surgical History: Past Surgical History: Procedure Laterality Date  ABDOMINAL HYSTERECTOMY  1971  CATARACT EXTRACTION W/  INTRAOCULAR LENS  IMPLANT, BILATERAL Bilateral 2016  Dr. Oneil Platts  IRRIGATION AND DEBRIDEMENT ABSCESS Left 02/23/2017  Procedure: IRRIGATION AND DEBRIDEMENT ABSCESS;  Surgeon: Vernetta Lonni GRADE, MD;  Location: [facility] OR;  Service: Orthopedics;  Laterality: Left;  REPAIR OF PERFORATED ULCER  1973 &1975  Dr Jannetta  STOMACH SURGERY  [phone]  removed 1/2 stomach; had a total of 5 ORs from 1973-1980; all related to perforated ulcer  TONSILLECTOMY   Pat Adams,M.S.,CCC-SLP 02/25/2023, 3:07 PM  US  Abdomen Limited RUQ (LIVER/GB) Result Date: 02/22/2023 CLINICAL DATA:  Abdominal pain. EXAM: ULTRASOUND ABDOMEN LIMITED RIGHT UPPER QUADRANT COMPARISON:  CT abdomen and pelvis 02/22/2023 FINDINGS: Gallbladder: Cholelithiasis with gallstones measuring up to 2 cm diameter. Layering sludge in the gallbladder. Murphy's sign is negative. Eccentric density in the gallbladder wall likely representing tumefactive sludge but gallbladder mass lesion is not excluded. Suggest follow-up with contrast-enhanced MRI/MRCP. Common bile duct: Diameter: 7 mm, upper limits of normal. Liver: No focal lesion identified. Within normal limits in parenchymal echogenicity. The focal liver lesion seen at CT is not well demonstrated sonographically. Portal vein is patent on color Doppler imaging with normal direction of blood flow towards the liver. Other: None. IMPRESSION: 1. Cholelithiasis and gallbladder sludge without evidence of acute cholecystitis. 2. Eccentric density in the gallbladder may represent tumefactive sludge but gallbladder mass lesion is not excluded. Suggest follow-up with contrast-enhanced MRI/MRCP. Electronically Signed   By: Elsie Gravely M.D.   On: 02/22/2023 18:58   CT  Renal Stone Study Result Date: 02/22/2023 CLINICAL DATA:  Urinary tract infection and acute kidney injury. * Tracking Code: BO * EXAM: CT ABDOMEN AND PELVIS WITHOUT CONTRAST TECHNIQUE: Multidetector CT imaging of the abdomen and pelvis was performed following the standard protocol without IV contrast. RADIATION DOSE REDUCTION: This exam was performed according to the departmental dose-optimization program which includes automated exposure control, adjustment of the mA and/or kV according to patient size and/or use of iterative reconstruction technique. COMPARISON:  CT abdomen and pelvis dated 07/17/2021 FINDINGS: Lower chest: Bilateral lower lobe irregular atelectasis/consolidation, left-greater-than-right. Trace left pleural effusion. Partially imaged heart size is normal. Hepatobiliary: Subtle hypoattenuating focus measuring 1.0 cm in segment 7/8 (2:8), new from 07/17/2021. Unchanged segment 4 cyst. No intra or extrahepatic biliary ductal dilation. Mildly distended gallbladder with partially calcified irregular mural thickening along the medial wall (2:33). Pancreas: Similar pancreatic parenchymal atrophy and dilation of the pancreatic duct in the tail up to 5 mm. Spleen: Normal in size without focal abnormality. Adrenals/Urinary Tract: 1.3 cm left adrenal nodule measures 11 HU, however appears new (2:17). No definite right adrenal nodule. No suspicious renal mass on this noncontrast enhanced examination , calculi or hydronephrosis. No focal bladder wall thickening. Stomach/Bowel: Multiple surgical clips at the gastroesophageal junction. Normal appearance of the stomach. Postsurgical changes of small bowel in the left upper quadrant. Patent anastomosis. No evidence of bowel wall thickening, distention, or inflammatory changes. Appendix is not discretely seen. Vascular/Lymphatic: Aortic atherosclerosis. Multi station new periportal and retroaortic lymphadenopathy, for example 2.0 cm portacaval (2:25) and 1.2 cm  left para-aortic (2:29). Reproductive: No adnexal masses. Other: Trace presacral edema.  No free air. Musculoskeletal: No acute or abnormal lytic or blastic osseous lesions. IMPRESSION: 1. Bilateral lower lobe irregular atelectasis/consolidation, left-greater-than-right, suspicious for aspiration or pneumonia. 2. Mildly distended gallbladder with partially calcified irregular mural thickening along the medial wall, suspicious for gallbladder malignancy, either primary or metastatic. Recommend further evaluation with contrast-enhanced MRI abdomen. Alternatively, right upper quadrant ultrasound examination can be considered. 3.  Subtle hypoattenuating focus measuring 1.0 cm in hepatic segment 7/8, new from 07/17/2021, indeterminate, however suspicious for metastatic disease. This finding can be evaluated at the same time on MRI abdomen. 4. Multi station new periportal and retroaortic lymphadenopathy, suspicious for metastatic disease. 5. New 1.3 cm left adrenal nodule, indeterminate. This finding can be evaluated at the same time on MRI abdomen. 6.  Aortic Atherosclerosis (ICD10-I70.0). Electronically Signed   By: Limin  Xu M.D.   On: 02/22/2023 17:19   DG Chest Port 1 View Result Date: 02/22/2023 CLINICAL DATA:  Shortness of breath. EXAM: PORTABLE CHEST 1 VIEW COMPARISON:  None Available. FINDINGS: The linear area of scarring/atelectasis in the left retrocardiac region. Bilateral lungs appear hyperexpanded and hyperlucent with coarse bronchovascular markings, concerning for underlying COPD. Bilateral lungs otherwise appear clear. No dense consolidation or lung collapse. Bilateral costophrenic angles are clear. Normal cardio-mediastinal silhouette. No acute osseous abnormalities. The soft tissues are within normal limits. IMPRESSION: *Left retrocardiac scarring/atelectasis.  Probable COPD. Electronically Signed   By: Ree Molt M.D.   On: 02/22/2023 14:46   DG Abd 1 View Result Date: 02/20/2023 CLINICAL DATA:   Abdominal pain EXAM: ABDOMEN - 1 VIEW COMPARISON:  11/25/2018 FINDINGS: No evidence of bowel obstruction or significant ileus. Continued evidence of prior abdominal surgery including prior bowel anastomoses. No evidence of free intraperitoneal air. No abnormal calcifications. Visualized bony structures are unremarkable. IMPRESSION: No acute findings. Continued evidence of prior abdominal surgery. Electronically Signed   By: Marcey Moan M.D.   On: 02/20/2023 13:53     Discharge Exam: Vitals:   03/01/23 0423 03/01/23 0903  BP: (!) 152/87 (!) 150/72  Pulse: 97 93  Resp: 18   Temp: 97.8 F (36.6 C)   SpO2: (!) 86% 98%   Vitals:   02/28/23 1332 02/28/23 2052 03/01/23 0423 03/01/23 0903  BP: (!) 160/81 (!) 161/73 (!) 152/87 (!) 150/72  Pulse: 90 92 97 93  Resp: 20  18   Temp: 98.9 F (37.2 C) 98.4 F (36.9 C) 97.8 F (36.6 C)   TempSrc: Oral Oral Oral   SpO2: 93% (!) 88% (!) 86% 98%  Weight:      Height:        General: Pt is alert, awake, not in acute distress Cardiovascular: RRR, S1/S2 +, no rubs, no gallops Respiratory: CTA bilaterally, no wheezing, no rhonchi Abdominal: Soft, NT, ND, bowel sounds + Extremities: no edema, no cyanosis    The results of significant diagnostics from this hospitalization (including imaging, microbiology, ancillary and laboratory) are listed below for reference.     Microbiology: Recent Results (from the past 240 hours)  Urine Culture     Status: Abnormal   Collection Time: 02/19/23  1:54 PM  Result Value Ref Range Status   MICRO NUMBER: [id number]  Final   SPECIMEN QUALITY: Adequate  Final   Sample Source URINE, CLEAN CATCH  Final   STATUS: FINAL  Final   ISOLATE 1: Escherichia coli (A)  Final    Comment: Greater than 100,000 CFU/mL of Escherichia coli      Susceptibility   Escherichia coli - URINE CULTURE, REFLEX    AMOX/CLAVULANIC <=2 Sensitive     AMPICILLIN <=2 Sensitive     AMPICILLIN/SULBACTAM <=2 Sensitive     CEFAZOLIN* <=4  Not Reportable      * For infections other than uncomplicated UTI caused by E. coli, K. pneumoniae or P. mirabilis: Cefazolin is resistant if MIC > or = 8 mcg/mL. (Distinguishing susceptible versus  intermediate for isolates with MIC < or = 4 mcg/mL requires additional testing.) For uncomplicated UTI caused by E. coli, K. pneumoniae or P. mirabilis: Cefazolin is susceptible if MIC <32 mcg/mL and predicts susceptible to the oral agents cefaclor, cefdinir, cefpodoxime, cefprozil, cefuroxime, cephalexin  and loracarbef.     CEFTAZIDIME <=1 Sensitive     CEFEPIME <=1 Sensitive     CEFTRIAXONE  <=1 Sensitive     CIPROFLOXACIN  <=0.25 Sensitive     LEVOFLOXACIN  <=0.12 Sensitive     GENTAMICIN <=1 Sensitive     IMIPENEM <=0.25 Sensitive     NITROFURANTOIN <=16 Sensitive     PIP/TAZO <=4 Sensitive     TOBRAMYCIN <=1 Sensitive     TRIMETH/SULFA* <=20 Sensitive      * For infections other than uncomplicated UTI caused by E. coli, K. pneumoniae or P. mirabilis: Cefazolin is resistant if MIC > or = 8 mcg/mL. (Distinguishing susceptible versus intermediate for isolates with MIC < or = 4 mcg/mL requires additional testing.) For uncomplicated UTI caused by E. coli, K. pneumoniae or P. mirabilis: Cefazolin is susceptible if MIC <32 mcg/mL and predicts susceptible to the oral agents cefaclor, cefdinir, cefpodoxime, cefprozil, cefuroxime, cephalexin  and loracarbef. Legend: S = Susceptible  I = Intermediate R = Resistant  NS = Not susceptible SDD = Susceptible Dose Dependent * = Not Tested  NR = Not Reported **NN = See Therapy Comments   Urine Culture (for pregnant, neutropenic or urologic patients or patients with an indwelling urinary catheter)     Status: Abnormal   Collection Time: 02/22/23  1:12 PM   Specimen: Urine, Clean Catch  Result Value Ref Range Status   Specimen Description   Final    URINE, CLEAN CATCH Performed at [facility], [address],  [place], [place] [address]    Special Requests   Final    NONE Performed at [facility], [address], [place], [place] [address]    Culture 80,000 COLONIES/mL ESCHERICHIA COLI (A)  Final   Report Status 02/25/2023 FINAL  Final   Organism ID, Bacteria ESCHERICHIA COLI (A)  Final      Susceptibility   Escherichia coli - MIC*    AMPICILLIN 4 SENSITIVE Sensitive     CEFAZOLIN <=4 SENSITIVE Sensitive     CEFEPIME <=0.12 SENSITIVE Sensitive     CEFTRIAXONE  <=0.25 SENSITIVE Sensitive     CIPROFLOXACIN  <=0.25 SENSITIVE Sensitive     GENTAMICIN <=1 SENSITIVE Sensitive     IMIPENEM <=0.25 SENSITIVE Sensitive     NITROFURANTOIN <=16 SENSITIVE Sensitive     TRIMETH/SULFA <=20 SENSITIVE Sensitive     AMPICILLIN/SULBACTAM <=2 SENSITIVE Sensitive     PIP/TAZO <=4 SENSITIVE Sensitive ug/mL    * 80,000 COLONIES/mL ESCHERICHIA COLI  Culture, blood (routine x 2)     Status: Abnormal   Collection Time: 02/22/23  1:35 PM   Specimen: BLOOD  Result Value Ref Range Status   Specimen Description   Final    BLOOD RIGHT ANTECUBITAL Performed at [facility], [address] [place], [place] [address]    Special Requests   Final    BOTTLES DRAWN AEROBIC AND ANAEROBIC Blood Culture results may not be optimal due to an inadequate volume of blood received in culture bottles Performed at [facility], [address], [place], [place] [address]    Culture  Setup Time   Final    GRAM NEGATIVE RODS IN BOTH AEROBIC AND ANAEROBIC BOTTLES CRITICAL RESULT CALLED  TO, READ BACK BY AND VERIFIED WITH: L POINDEXTER,PHARMD@[id number]  02/23/23 MK Performed at [facility], [address] [place], [place] [address]    Culture ESCHERICHIA COLI (A)  Final   Report Status 02/25/2023 FINAL  Final   Organism ID, Bacteria ESCHERICHIA COLI  Final   Organism ID, Bacteria ESCHERICHIA COLI  Final      Susceptibility   Escherichia coli - KIRBY BAUER*    CEFAZOLIN  SENSITIVE Sensitive    Escherichia coli - MIC*    AMPICILLIN <=2 SENSITIVE Sensitive     CEFEPIME <=0.12 SENSITIVE Sensitive     CEFTAZIDIME <=1 SENSITIVE Sensitive     CEFTRIAXONE  <=0.25 SENSITIVE Sensitive     CIPROFLOXACIN  <=0.25 SENSITIVE Sensitive     GENTAMICIN <=1 SENSITIVE Sensitive     IMIPENEM <=0.25 SENSITIVE Sensitive     TRIMETH/SULFA <=20 SENSITIVE Sensitive     AMPICILLIN/SULBACTAM <=2 SENSITIVE Sensitive     PIP/TAZO <=4 SENSITIVE Sensitive ug/mL    * ESCHERICHIA COLI    ESCHERICHIA COLI  Blood Culture ID Panel (Reflexed)     Status: Abnormal   Collection Time: 02/22/23  1:35 PM  Result Value Ref Range Status   Enterococcus faecalis NOT DETECTED NOT DETECTED Final   Enterococcus Faecium NOT DETECTED NOT DETECTED Final   Listeria monocytogenes NOT DETECTED NOT DETECTED Final   Staphylococcus species NOT DETECTED NOT DETECTED Final   Staphylococcus aureus (BCID) NOT DETECTED NOT DETECTED Final   Staphylococcus epidermidis NOT DETECTED NOT DETECTED Final   Staphylococcus lugdunensis NOT DETECTED NOT DETECTED Final   Streptococcus species NOT DETECTED NOT DETECTED Final   Streptococcus agalactiae NOT DETECTED NOT DETECTED Final   Streptococcus pneumoniae NOT DETECTED NOT DETECTED Final   Streptococcus pyogenes NOT DETECTED NOT DETECTED Final   A.calcoaceticus-baumannii NOT DETECTED NOT DETECTED Final   Bacteroides fragilis NOT DETECTED NOT DETECTED Final   Enterobacterales DETECTED (A) NOT DETECTED Final    Comment: Enterobacterales represent a large order of gram negative bacteria, not a single organism. CRITICAL RESULT CALLED TO, READ BACK BY AND VERIFIED WITH: L POINDEXTER,PHARMD@0515  02/23/23 MK    Enterobacter cloacae complex NOT DETECTED NOT DETECTED Final   Escherichia coli DETECTED (A) NOT DETECTED Final    Comment: CRITICAL RESULT CALLED TO, READ BACK BY AND VERIFIED WITH: L POINDEXTER,PHARMD@0515  02/23/23 MK    Klebsiella aerogenes NOT DETECTED NOT  DETECTED Final   Klebsiella oxytoca NOT DETECTED NOT DETECTED Final   Klebsiella pneumoniae NOT DETECTED NOT DETECTED Final   Proteus species NOT DETECTED NOT DETECTED Final   Salmonella species NOT DETECTED NOT DETECTED Final   Serratia marcescens NOT DETECTED NOT DETECTED Final   Haemophilus influenzae NOT DETECTED NOT DETECTED Final   Neisseria meningitidis NOT DETECTED NOT DETECTED Final   Pseudomonas aeruginosa NOT DETECTED NOT DETECTED Final   Stenotrophomonas maltophilia NOT DETECTED NOT DETECTED Final   Candida albicans NOT DETECTED NOT DETECTED Final   Candida auris NOT DETECTED NOT DETECTED Final   Candida glabrata NOT DETECTED NOT DETECTED Final   Candida krusei NOT DETECTED NOT DETECTED Final   Candida parapsilosis NOT DETECTED NOT DETECTED Final   Candida tropicalis NOT DETECTED NOT DETECTED Final   Cryptococcus neoformans/gattii NOT DETECTED NOT DETECTED Final   CTX-M ESBL NOT DETECTED NOT DETECTED Final   Carbapenem resistance IMP NOT DETECTED NOT DETECTED Final   Carbapenem resistance KPC NOT DETECTED NOT DETECTED Final   Carbapenem resistance NDM NOT DETECTED NOT DETECTED Final   Carbapenem resist OXA 48 LIKE NOT DETECTED  NOT DETECTED Final   Carbapenem resistance VIM NOT DETECTED NOT DETECTED Final    Comment: Performed at [facility], [address], [place], [place] [address]  Culture, blood (Routine X 2) w Reflex to ID Panel     Status: None (Preliminary result)   Collection Time: 02/27/23 11:45 AM   Specimen: BLOOD RIGHT ARM  Result Value Ref Range Status   Specimen Description BLOOD RIGHT ARM  Final   Special Requests   Final    BOTTLES DRAWN AEROBIC ONLY Blood Culture results may not be optimal due to an inadequate volume of blood received in culture bottles   Culture   Final    NO GROWTH 2 DAYS Performed at [facility], [address], [place], [place] [address]    Report Status PENDING  Incomplete  Culture, blood (Routine X 2) w Reflex to ID  Panel     Status: None (Preliminary result)   Collection Time: 02/27/23 11:52 AM   Specimen: BLOOD RIGHT ARM  Result Value Ref Range Status   Specimen Description BLOOD RIGHT ARM  Final   Special Requests   Final    BOTTLES DRAWN AEROBIC AND ANAEROBIC Blood Culture results may not be optimal due to an inadequate volume of blood received in culture bottles   Culture   Final    NO GROWTH 2 DAYS Performed at [facility], [address], [place], [place] [address]    Report Status PENDING  Incomplete     Labs: BNP (last 3 results) No results for input(s): BNP in the last 8760 hours. Basic Metabolic Panel: Recent Labs  Lab 02/23/23 0412 02/24/23 0520 02/25/23 0806 02/26/23 0542 02/27/23 0823 02/28/23 0549 03/01/23 0823  NA 129* 128* 137 137 139 137  --   K 3.5 3.7 3.8 3.8 3.9 4.1  --   CL 103 103 109 113* 104 104  --   CO2 16* 15* 17* 17* 24 25  --   GLUCOSE 111* 130* 113* 100* 99 95  --   BUN 34* 39* 41* 33* 26* 20  --   CREATININE 2.62* 2.20* 2.45* 1.79* 1.75* 1.69*  --   CALCIUM 7.2* 7.5* 7.9* 7.6* 8.0* 8.0*  --   MG 2.1  --   --  1.7 1.8 1.8 1.8  PHOS  --   --   --  2.2* 2.2* 2.8  --    Liver Function Tests: Recent Labs  Lab 02/22/23 1435 02/25/23 0806 02/26/23 0542 02/27/23 0823 02/28/23 0549  AST 18 15 15 21 20   ALT 10 11 11 13 12   ALKPHOS 100 89 75 91 81  BILITOT 0.8 0.4 0.4 0.4 0.4  PROT 5.2* 5.2* 5.0* 5.6* 5.1*  ALBUMIN 2.2* 2.0* 1.9* 2.2* 2.0*   Recent Labs  Lab 02/22/23 1927  LIPASE 20   No results for input(s): AMMONIA in the last 168 hours. CBC: Recent Labs  Lab 02/22/23 1335 02/22/23 1927 02/24/23 0520 02/25/23 0806 02/26/23 0542 02/27/23 0823 02/28/23 0549  WBC 32.4*   < > 23.9* 17.5* 14.5* 15.2* 14.0*  NEUTROABS 28.3*  --   --  14.6* 11.9* 11.8* 11.2*  HGB 9.5*   < > 7.8* 8.0* 7.3* 8.1* 7.1*  HCT 28.9*   < > 25.3* 25.5* 22.5* 25.0* 23.2*  MCV 81.9   < > 84.3 82.8 80.6 80.6 83.2  PLT 196   < > 279 391 479* 531* 556*   < >  = values in  this interval not displayed.   Cardiac Enzymes: No results for input(s): CKTOTAL, CKMB, CKMBINDEX, TROPONINI in the last 168 hours. BNP: Invalid input(s): POCBNP CBG: No results for input(s): GLUCAP in the last 168 hours. D-Dimer No results for input(s): DDIMER in the last 72 hours. Hgb A1c No results for input(s): HGBA1C in the last 72 hours. Lipid Profile No results for input(s): CHOL, HDL, LDLCALC, TRIG, CHOLHDL, LDLDIRECT in the last 72 hours. Thyroid  function studies No results for input(s): TSH, T4TOTAL, T3FREE, THYROIDAB in the last 72 hours.  Invalid input(s): FREET3 Anemia work up Recent Labs    02/27/23 0823 02/28/23 0549  VITAMINB12 152*  --   FOLATE  --  15.4  FERRITIN 70  --    Urinalysis    Component Value Date/Time   COLORURINE YELLOW 02/22/2023 1312   APPEARANCEUR CLOUDY (A) 02/22/2023 1312   LABSPEC 1.011 02/22/2023 1312   PHURINE 5.0 02/22/2023 1312   GLUCOSEU NEGATIVE 02/22/2023 1312   HGBUR SMALL (A) 02/22/2023 1312   BILIRUBINUR NEGATIVE 02/22/2023 1312   BILIRUBINUR negative 02/19/2023 1358   BILIRUBINUR neg 05/14/2014 1601   KETONESUR NEGATIVE 02/22/2023 1312   PROTEINUR 100 (A) 02/22/2023 1312   UROBILINOGEN 0.2 02/19/2023 1358   NITRITE NEGATIVE 02/22/2023 1312   LEUKOCYTESUR LARGE (A) 02/22/2023 1312   Sepsis Labs Recent Labs  Lab 02/25/23 0806 02/26/23 0542 02/27/23 0823 02/28/23 0549  WBC 17.5* 14.5* 15.2* 14.0*   Microbiology Recent Results (from the past 240 hours)  Urine Culture     Status: Abnormal   Collection Time: 02/19/23  1:54 PM  Result Value Ref Range Status   MICRO NUMBER: [id number]  Final   SPECIMEN QUALITY: Adequate  Final   Sample Source URINE, CLEAN CATCH  Final   STATUS: FINAL  Final   ISOLATE 1: Escherichia coli (A)  Final    Comment: Greater than 100,000 CFU/mL of Escherichia coli      Susceptibility   Escherichia coli - URINE CULTURE, REFLEX     AMOX/CLAVULANIC <=2 Sensitive     AMPICILLIN <=2 Sensitive     AMPICILLIN/SULBACTAM <=2 Sensitive     CEFAZOLIN* <=4 Not Reportable      * For infections other than uncomplicated UTI caused by E. coli, K. pneumoniae or P. mirabilis: Cefazolin is resistant if MIC > or = 8 mcg/mL. (Distinguishing susceptible versus intermediate for isolates with MIC < or = 4 mcg/mL requires additional testing.) For uncomplicated UTI caused by E. coli, K. pneumoniae or P. mirabilis: Cefazolin is susceptible if MIC <32 mcg/mL and predicts susceptible to the oral agents cefaclor, cefdinir, cefpodoxime, cefprozil, cefuroxime, cephalexin  and loracarbef.     CEFTAZIDIME <=1 Sensitive     CEFEPIME <=1 Sensitive     CEFTRIAXONE  <=1 Sensitive     CIPROFLOXACIN  <=0.25 Sensitive     LEVOFLOXACIN  <=0.12 Sensitive     GENTAMICIN <=1 Sensitive     IMIPENEM <=0.25 Sensitive     NITROFURANTOIN <=16 Sensitive     PIP/TAZO <=4 Sensitive     TOBRAMYCIN <=1 Sensitive     TRIMETH/SULFA* <=20 Sensitive      * For infections other than uncomplicated UTI caused by E. coli, K. pneumoniae or P. mirabilis: Cefazolin is resistant if MIC > or = 8 mcg/mL. (Distinguishing susceptible versus intermediate for isolates with MIC < or = 4 mcg/mL requires additional testing.) For uncomplicated UTI caused by E. coli, K. pneumoniae or P. mirabilis: Cefazolin is susceptible if MIC <32 mcg/mL and predicts susceptible to the oral  agents cefaclor, cefdinir, cefpodoxime, cefprozil, cefuroxime, cephalexin  and loracarbef. Legend: S = Susceptible  I = Intermediate R = Resistant  NS = Not susceptible SDD = Susceptible Dose Dependent * = Not Tested  NR = Not Reported **NN = See Therapy Comments   Urine Culture (for pregnant, neutropenic or urologic patients or patients with an indwelling urinary catheter)     Status: Abnormal   Collection Time: 02/22/23  1:12 PM   Specimen: Urine, Clean Catch  Result Value Ref Range Status    Specimen Description   Final    URINE, CLEAN CATCH Performed at [facility], [address] [place], [place] [address]    Special Requests   Final    NONE Performed at [facility], [address], [place], [place] [address]    Culture 80,000 COLONIES/mL ESCHERICHIA COLI (A)  Final   Report Status 02/25/2023 FINAL  Final   Organism ID, Bacteria ESCHERICHIA COLI (A)  Final      Susceptibility   Escherichia coli - MIC*    AMPICILLIN 4 SENSITIVE Sensitive     CEFAZOLIN <=4 SENSITIVE Sensitive     CEFEPIME <=0.12 SENSITIVE Sensitive     CEFTRIAXONE  <=0.25 SENSITIVE Sensitive     CIPROFLOXACIN  <=0.25 SENSITIVE Sensitive     GENTAMICIN <=1 SENSITIVE Sensitive     IMIPENEM <=0.25 SENSITIVE Sensitive     NITROFURANTOIN <=16 SENSITIVE Sensitive     TRIMETH/SULFA <=20 SENSITIVE Sensitive     AMPICILLIN/SULBACTAM <=2 SENSITIVE Sensitive     PIP/TAZO <=4 SENSITIVE Sensitive ug/mL    * 80,000 COLONIES/mL ESCHERICHIA COLI  Culture, blood (routine x 2)     Status: Abnormal   Collection Time: 02/22/23  1:35 PM   Specimen: BLOOD  Result Value Ref Range Status   Specimen Description   Final    BLOOD RIGHT ANTECUBITAL Performed at [facility], [address], [place], [place] [address]    Special Requests   Final    BOTTLES DRAWN AEROBIC AND ANAEROBIC Blood Culture results may not be optimal due to an inadequate volume of blood received in culture bottles Performed at [facility], [address], [place], [place] [address]    Culture  Setup Time   Final    GRAM NEGATIVE RODS IN BOTH AEROBIC AND ANAEROBIC BOTTLES CRITICAL RESULT CALLED TO, READ BACK BY AND VERIFIED WITH: L POINDEXTER,PHARMD@0515  02/23/23 MK Performed at [facility], [address], [place], [place] [address]    Culture ESCHERICHIA COLI (A)  Final   Report Status 02/25/2023 FINAL  Final   Organism ID, Bacteria ESCHERICHIA COLI  Final    Organism ID, Bacteria ESCHERICHIA COLI  Final      Susceptibility   Escherichia coli - KIRBY BAUER*    CEFAZOLIN SENSITIVE Sensitive    Escherichia coli - MIC*    AMPICILLIN <=2 SENSITIVE Sensitive     CEFEPIME <=0.12 SENSITIVE Sensitive     CEFTAZIDIME <=1 SENSITIVE Sensitive     CEFTRIAXONE  <=0.25 SENSITIVE Sensitive     CIPROFLOXACIN  <=0.25 SENSITIVE Sensitive     GENTAMICIN <=1 SENSITIVE Sensitive     IMIPENEM <=0.25 SENSITIVE Sensitive     TRIMETH/SULFA <=20 SENSITIVE Sensitive     AMPICILLIN/SULBACTAM <=2 SENSITIVE Sensitive     PIP/TAZO <=4 SENSITIVE Sensitive ug/mL    * ESCHERICHIA COLI    ESCHERICHIA COLI  Blood Culture ID Panel (Reflexed)     Status: Abnormal   Collection Time: 02/22/23  1:35  PM  Result Value Ref Range Status   Enterococcus faecalis NOT DETECTED NOT DETECTED Final   Enterococcus Faecium NOT DETECTED NOT DETECTED Final   Listeria monocytogenes NOT DETECTED NOT DETECTED Final   Staphylococcus species NOT DETECTED NOT DETECTED Final   Staphylococcus aureus (BCID) NOT DETECTED NOT DETECTED Final   Staphylococcus epidermidis NOT DETECTED NOT DETECTED Final   Staphylococcus lugdunensis NOT DETECTED NOT DETECTED Final   Streptococcus species NOT DETECTED NOT DETECTED Final   Streptococcus agalactiae NOT DETECTED NOT DETECTED Final   Streptococcus pneumoniae NOT DETECTED NOT DETECTED Final   Streptococcus pyogenes NOT DETECTED NOT DETECTED Final   A.calcoaceticus-baumannii NOT DETECTED NOT DETECTED Final   Bacteroides fragilis NOT DETECTED NOT DETECTED Final   Enterobacterales DETECTED (A) NOT DETECTED Final    Comment: Enterobacterales represent a large order of gram negative bacteria, not a single organism. CRITICAL RESULT CALLED TO, READ BACK BY AND VERIFIED WITH: L POINDEXTER,PHARMD@0515  02/23/23 MK    Enterobacter cloacae complex NOT DETECTED NOT DETECTED Final   Escherichia coli DETECTED (A) NOT DETECTED Final    Comment: CRITICAL RESULT CALLED TO,  READ BACK BY AND VERIFIED WITH: L POINDEXTER,PHARMD@0515  02/23/23 MK    Klebsiella aerogenes NOT DETECTED NOT DETECTED Final   Klebsiella oxytoca NOT DETECTED NOT DETECTED Final   Klebsiella pneumoniae NOT DETECTED NOT DETECTED Final   Proteus species NOT DETECTED NOT DETECTED Final   Salmonella species NOT DETECTED NOT DETECTED Final   Serratia marcescens NOT DETECTED NOT DETECTED Final   Haemophilus influenzae NOT DETECTED NOT DETECTED Final   Neisseria meningitidis NOT DETECTED NOT DETECTED Final   Pseudomonas aeruginosa NOT DETECTED NOT DETECTED Final   Stenotrophomonas maltophilia NOT DETECTED NOT DETECTED Final   Candida albicans NOT DETECTED NOT DETECTED Final   Candida auris NOT DETECTED NOT DETECTED Final   Candida glabrata NOT DETECTED NOT DETECTED Final   Candida krusei NOT DETECTED NOT DETECTED Final   Candida parapsilosis NOT DETECTED NOT DETECTED Final   Candida tropicalis NOT DETECTED NOT DETECTED Final   Cryptococcus neoformans/gattii NOT DETECTED NOT DETECTED Final   CTX-M ESBL NOT DETECTED NOT DETECTED Final   Carbapenem resistance IMP NOT DETECTED NOT DETECTED Final   Carbapenem resistance KPC NOT DETECTED NOT DETECTED Final   Carbapenem resistance NDM NOT DETECTED NOT DETECTED Final   Carbapenem resist OXA 48 LIKE NOT DETECTED NOT DETECTED Final   Carbapenem resistance VIM NOT DETECTED NOT DETECTED Final    Comment: Performed at [facility], [address], [place], [place] [address]  Culture, blood (Routine X 2) w Reflex to ID Panel     Status: None (Preliminary result)   Collection Time: 02/27/23 11:45 AM   Specimen: BLOOD RIGHT ARM  Result Value Ref Range Status   Specimen Description BLOOD RIGHT ARM  Final   Special Requests   Final    BOTTLES DRAWN AEROBIC ONLY Blood Culture results may not be optimal due to an inadequate volume of blood received in culture bottles   Culture   Final    NO GROWTH 2 DAYS Performed at [facility], [address] [place], [place] [address]    Report Status PENDING  Incomplete  Culture, blood (Routine X 2) w Reflex to ID Panel     Status: None (Preliminary result)   Collection Time: 02/27/23 11:52 AM   Specimen: BLOOD RIGHT ARM  Result Value Ref Range Status   Specimen Description BLOOD RIGHT ARM  Final   Special Requests  Final    BOTTLES DRAWN AEROBIC AND ANAEROBIC Blood Culture results may not be optimal due to an inadequate volume of blood received in culture bottles   Culture   Final    NO GROWTH 2 DAYS Performed at [facility], [address], [place], [place] [address]    Report Status PENDING  Incomplete    FURTHER DISCHARGE INSTRUCTIONS:   Get Medicines reviewed and adjusted: Please take all your medications with you for your next visit with your Primary MD   Laboratory/radiological data: Please request your Primary MD to go over all hospital tests and procedure/radiological results at the follow up, please ask your Primary MD to get all Hospital records sent to his/her office.   In some cases, they will be blood work, cultures and biopsy results pending at the time of your discharge. Please request that your primary care M.D. goes through all the records of your hospital data and follows up on these results.   Also Note the following: If you experience worsening of your admission symptoms, develop shortness of breath, life threatening emergency, suicidal or homicidal thoughts you must seek medical attention immediately by calling 911 or calling your MD immediately  if symptoms less severe.   You must read complete instructions/literature along with all the possible adverse reactions/side effects for all the Medicines you take and that have been prescribed to you. Take any new Medicines after you have completely understood and accpet all the possible adverse reactions/side effects.    Do not drive when taking Pain medications or sleeping medications (Benzodaizepines)   Do  not take more than prescribed Pain, Sleep and Anxiety Medications. It is not advisable to combine anxiety,sleep and pain medications without talking with your primary care practitioner   Special Instructions: If you have smoked or chewed Tobacco  in the last 2 yrs please stop smoking, stop any regular Alcohol   and or any Recreational drug use.   Wear Seat belts while driving.   Please note: You were cared for by a hospitalist during your hospital stay. Once you are discharged, your primary care physician will handle any further medical issues. Please note that NO REFILLS for any discharge medications will be authorized once you are discharged, as it is imperative that you return to your primary care physician (or establish a relationship with a primary care physician if you do not have one) for your post hospital discharge needs so that they can reassess your need for medications and monitor your lab values  Time coordinating discharge: Over 30 minutes  SIGNED:   Fredia Skeeter, MD  [facility] 03/01/2023, 12:15 PM *Please note that this is a verbal dictation therefore any spelling or grammatical errors are due to the Dragon Medical One system interpretation. If 7PM-7AM, please contact night-coverage [email or web address]

## 2023-03-01 NOTE — Assessment & Plan Note (Signed)
 E. coli urinary tract infection complicated with concurrent E. coli bacteremia No evidence of pyelonephritis on CT imaging. Patient has received 6 days of intravenous ceftriaxone, antibiotics have been stopped 1/12.

## 2023-03-01 NOTE — Assessment & Plan Note (Signed)
 Substantial anemia without any clinical evidence of ongoing bleeding Likely a combination of anemia of chronic disease, iron deficiency anemia, vitamin B12 deficiency and hemodilution

## 2023-03-01 NOTE — Progress Notes (Signed)
SATURATION QUALIFICATIONS: (This note is used to comply with regulatory documentation for home oxygen)  Patient Saturations on Room Air at Rest = 95%  Patient Saturations on Room Air while Ambulating = >/=91%   

## 2023-03-01 NOTE — Assessment & Plan Note (Signed)
·   Please see assessment and plan above °

## 2023-03-01 NOTE — Progress Notes (Signed)
 [facility] Liaison Note  Received request from for hospice services at home after discharge. Spoke with patient and friends at bedside to initiate education related to hospice philosophy, services, and team approach to care. Patient/family verbalized understanding of information given. Per discussion, the plan is for discharge home by PTAR/EMS or private vehicle today.   DME needs discussed. Patient/family requests the following equipment for delivery: walker, shower chiar, Oxygen, hospital bed, overbed table, and bedside commode. The address has been verified and is correct in the chart. Barnie is the family contact to arrange time of equipment delivery.  Please send signed and completed DNR home with patient/family. Please provide prescriptions at discharge as needed to ensure ongoing symptom management.   [facility] information and contact numbers given to patient. Above information shared with, Transitions of Care Manager. Please call with any questions or concerns.  Thank you for the opportunity to participate in this patient's care.   Elouise Husband BSN, [employer] [employer], OCN [employer] [phone]

## 2023-03-01 NOTE — TOC Transition Note (Signed)
 Transition of Care [facility]) - Discharge Note   Patient Details  Name: [name] MRN: [id number] Date of Birth: 07-Dec-1935  Transition [facility]) CM/SW Contact:  Hoy DELENA Bigness, LCSW Phone Number: 03/01/2023, 10:58 AM   Clinical Narrative:    Confirmed plan for pt to return home with services through [facility]. Pt was referred to [facility] for palliative care services however, pt recommended for hospice services and confirmed with pt this is her preference at discharge. Contacted Elouise Husband with [facility] to change recommendation to hospice. Pt to return home with no urgent DME needs. Pt to have DME delivered to home tomorrow. Pt to have nursing visit in home tonight. No further TOC needs identified.    Final next level of care: Home w Hospice Care Barriers to Discharge: Barriers Resolved   Patient Goals and CMS Choice Patient states their goals for this hospitalization and ongoing recovery are:: To return home CMS Medicare.gov Compare Post Acute Care list provided to:: Patient Choice offered to / list presented to : Patient [place] ownership interest in [facility].provided to::  (NA)    Discharge Placement                       Discharge Plan and Services Additional resources added to the After Visit Summary for   In-house Referral: Clinical Social Work Discharge Planning Services: NA Post Acute Care Choice: Home Health          DME Arranged: N/A DME Agency: NA       HH Arranged: Patient Refused HH HH Agency: [facility] Date [facility] Agency Contacted: 02/24/23 Time HH Agency Contacted: [phone] Representative spoke with at [facility] Agency: Jon  Social Drivers of Health (SDOH) Interventions SDOH Screenings   Food Insecurity: No Food Insecurity (02/23/2023)  Housing: Low Risk  (02/23/2023)  Transportation Needs: No Transportation Needs (02/23/2023)  Utilities: Not At Risk (02/23/2023)  Alcohol  Screen: Low Risk  (01/10/2018)  Depression (PHQ2-9): Low  Risk  (02/22/2023)  Financial Resource Strain: Low Risk  (08/25/2017)  Physical Activity: Insufficiently Active (08/25/2017)  Social Connections: Socially Integrated (02/23/2023)  Stress: No Stress Concern Present (08/25/2017)  Tobacco Use: Medium Risk (02/22/2023)     Readmission Risk Interventions    02/24/2023    3:56 PM  Readmission Risk Prevention Plan  Post Dischage Appt Complete  Medication Screening Complete  Transportation Screening Complete

## 2023-03-01 NOTE — Assessment & Plan Note (Signed)
 Supportive care

## 2023-03-01 NOTE — Assessment & Plan Note (Signed)
 Creatinine 1.69 Baseline creatinine previously 1.5. Discontinuing fluids Treating concurrent urinary tract infection which may be contributing. Dr. Arlean Hopping with nephrology has been following and has signed off.  His input is appreciated.

## 2023-03-01 NOTE — Progress Notes (Signed)
 Patient ID: [name], female   DOB: 05/18/1935, 88 y.o.   MRN: [id number]    Progress Note from the Palliative Medicine Team at [facility]   Patient Name: [name]        Date: 03/01/2023 DOB: 1935-04-20  Age: 88 y.o. MRN#: [id number] Attending Physician: Vernon Ranks, MD Primary Care Physician: Caro Harlene POUR, NP Admit Date: 02/22/2023   Reason for Consultation/Follow-up   Establishing Goals of Care   HPI/ Brief Hospital Review  88 y.o. female   admitted on 02/22/2023 with  PMH alcoholism/ sober for 35 years, cholelithiasis, hyperlipidemia, hypertension with complaints of malaise fatigue and decreased oral intake for the past 5 to 6 days.    Patient was sent to the [facility] emergency department identified by his primary care provider she was found to have developed an elevated creatinine of 2.4.     In the ED noted to have mild hyponatremia, AKI, suspected UTI, marked leukocytosis.  CT imaging of the abdomen pelvis revealed liver and gallbladder abnormalities as well as lymphadenopathy concerning for underlying malignancy.  And imaging abnormalities of the liver and gallbladder with lymphadenopathy concerning for malignancy.    Both blood and urine cultures came back positive for E. coli.     Patient faces treatment option decision advanced directive decisions and anticipatory care needs.   Subjective  Extensive chart review has been completed prior to meeting with patient/family  including labs, vital signs, imaging, progress/consult notes, orders, medications and available advance directive documents.    This NP with patient at bedside along with her significant other for ongoing conversations regarding current medical situation.   Patient has a clear understanding of her cancer diagnosis and has made a very clear decision and that she does not want any further workup, or treatment.   Her hope is to discharge home with hospice services, foregoing any life prolonging  measures and allowing for natural death.  [name] has been quite active in  exploring her own mortality over the last 15 years, she is active in the area Death [employer] and green  funeral conversations.  Occasion offered on hospice benefit; philosophy and eligibility.   Was able to speak to hospice liaison with a [facility] care collective and he will be over shortly to speak with [name] and her significant other.  Education offered on the natural trajectory and expectations at end-of-life  Patient declined completion of MOST form   Education offered today regarding  the importance of continued conversation with family and their  medical providers regarding overall plan of care and treatment options,  ensuring decisions are within the context of the patients values and GOCs.  Questions and concerns addressed   Discussed with primary team and nursing staff   Time:  50  minutes  Detailed review of medical records ( labs, imaging, vital signs), medically appropriate exam ( MS, skin, cardiac,  resp)   discussed with treatment team, counseling and education to patient, family, staff, documenting clinical information, medication management, coordination of care    Ronal Plants NP  Palliative Medicine Team Team Phone # [phone] Pager [phone]

## 2023-03-02 ENCOUNTER — Other Ambulatory Visit: Payer: Self-pay | Admitting: Nurse Practitioner

## 2023-03-02 DIAGNOSIS — R208 Other disturbances of skin sensation: Secondary | ICD-10-CM

## 2023-03-04 LAB — CULTURE, BLOOD (ROUTINE X 2)

## 2023-03-05 ENCOUNTER — Telehealth: Payer: Self-pay | Admitting: Nurse Practitioner

## 2023-03-05 NOTE — Telephone Encounter (Signed)
FYI:  Patient called in to cancel all her future appointments say that she is now under Hospice care and did not need those appointments at this time.

## 2023-03-05 NOTE — Telephone Encounter (Signed)
Noted thank you

## 2023-03-15 ENCOUNTER — Ambulatory Visit: Payer: 59 | Admitting: Nurse Practitioner

## 2023-03-23 ENCOUNTER — Other Ambulatory Visit: Payer: Self-pay | Admitting: Nurse Practitioner

## 2023-03-23 DIAGNOSIS — F419 Anxiety disorder, unspecified: Secondary | ICD-10-CM

## 2023-03-24 ENCOUNTER — Other Ambulatory Visit: Payer: Self-pay

## 2023-03-24 DIAGNOSIS — F419 Anxiety disorder, unspecified: Secondary | ICD-10-CM

## 2023-03-24 NOTE — Telephone Encounter (Signed)
 Patient is requesting a refill of the following medications: Requested Prescriptions   Pending Prescriptions Disp Refills   LORazepam  (ATIVAN ) 0.5 MG tablet 60 tablet 1    Sig: TAKE 1 TABLET(0.5 MG) BY MOUTH TWICE DAILY. NEED FOLLOW UP BEFORE ADDITIONAL REFILLS    Date of last refill:02/01/2023  Refill amount: 60 tablets   Treatment agreement date:

## 2023-03-25 MED ORDER — LORAZEPAM 0.5 MG PO TABS: ORAL_TABLET | ORAL | 0 refills | Status: DC

## 2023-03-25 NOTE — Addendum Note (Signed)
 Addended by: Verma Gobble on: 03/25/2023 10:22 AM   Modules accepted: Orders

## 2023-04-05 ENCOUNTER — Ambulatory Visit (INDEPENDENT_AMBULATORY_CARE_PROVIDER_SITE_OTHER): Payer: 59 | Admitting: Nurse Practitioner

## 2023-04-05 VITALS — BP 122/74 | HR 70 | Temp 97.6°F | Resp 17 | Ht 64.0 in | Wt 97.6 lb

## 2023-04-05 DIAGNOSIS — R5383 Other fatigue: Secondary | ICD-10-CM | POA: Diagnosis not present

## 2023-04-05 DIAGNOSIS — F419 Anxiety disorder, unspecified: Secondary | ICD-10-CM | POA: Diagnosis not present

## 2023-04-05 DIAGNOSIS — N3281 Overactive bladder: Secondary | ICD-10-CM

## 2023-04-05 DIAGNOSIS — I10 Essential (primary) hypertension: Secondary | ICD-10-CM | POA: Diagnosis not present

## 2023-04-05 DIAGNOSIS — K828 Other specified diseases of gallbladder: Secondary | ICD-10-CM

## 2023-04-05 DIAGNOSIS — Z515 Encounter for palliative care: Secondary | ICD-10-CM

## 2023-04-05 MED ORDER — LORAZEPAM 0.5 MG PO TABS: 0.5000 mg | ORAL_TABLET | Freq: Every day | ORAL | Status: DC | PRN

## 2023-04-05 NOTE — Progress Notes (Signed)
Careteam: Patient Care Team: Sharon Seller, NP as PCP - General (Geriatric Medicine) Jethro Bolus, MD as Consulting Physician (Ophthalmology) Annamaria Helling, MD as Consulting Physician (Obstetrics and Gynecology) Janalyn Harder, MD (Inactive) as Consulting Physician (Dermatology)  PLACE OF SERVICE:  [facility]  Advanced Directive information Does Patient Have a Medical Advance Directive?: Yes, Type of Advance Directive: Healthcare Power of [place];Living will, Does patient want to make changes to medical advance directive?: No - Patient declined  Allergies  Allergen Reactions   Cymbalta [Duloxetine Hcl] Diarrhea    Chief Complaint  Patient presents with   Medical Management of Chronic Issues    Patient is here for a general follow up    HPI: pt is a 88 y.o. female follow up.  Discussed the use of AI scribe software for clinical note transcription with the patient, who gave verbal consent to proceed.  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 She is supported by her boyfriend who assists with transportation and groceries.  She was noted to have mass in gallbladder likely cancer during a hospital stay for a severe urinary tract infection caused by E. coli, for which she received antibiotics. The diagnosis has led to her enrollment in hospice care, focusing on symptom management rather than further diagnostic workup or treatment.  She experiences discomfort in the bowel area, describing a sensation of fullness even after bowel movements. She manages this with a stool softener, which she finds to be the most gentle option. The discomfort is more pronounced in the mornings, but there is no sharp abdominal pain.  She has a history of low kidney function noted during her hospital stay. She is unsure of any specific symptoms related to this but mentions low energy as a significant issue. She was  informed of a B12 deficiency during her hospital stay and plans to begin taking a B12 supplement to help.  Her current medications include amlodipine and valsartan for blood pressure, gabapentin, and Ativan, which she takes once daily at night. She has stopped taking gemtesa for overactive bladder as it no longer seemed effective. She also takes medication for reflux, which is under control.  She has been in Alcoholics Anonymous for 35 years and has a supportive boyfriend who assists with transportation and groceries. She recently sold her car and no longer drives. She has no family or children but feels supported by her church community.      Review of Systems:  Review of Systems  Constitutional:  Negative for chills, fever and weight loss.  HENT:  Negative for tinnitus.   Respiratory:  Negative for cough, sputum production and shortness of breath.   Cardiovascular:  Negative for chest pain, palpitations and leg swelling.  Gastrointestinal:  Negative for abdominal pain, constipation, diarrhea and heartburn.  Genitourinary:  Negative for dysuria, frequency and urgency.  Musculoskeletal:  Negative for back pain, falls, joint pain and myalgias.  Skin: Negative.   Neurological:  Negative for dizziness and headaches.  Psychiatric/Behavioral:  Negative for depression and memory loss. The patient does not have insomnia.     Past Medical History:  Diagnosis Date   Age-related macular degeneration, dry, both eyes    Alcoholism (HCC)    Anemia    Anxiety    Cholelithiasis    Endometriosis    Goiter    Herpes simplex    Hyperlipidemia    Hypertension  Loss of weight    Nontoxic multinodular goiter    Other abnormal blood chemistry    PUD (peptic ulcer disease)    Renal cyst    Sciatica    Scoliosis (and kyphoscoliosis), idiopathic    Unspecified disorder of kidney and ureter    Urinary frequency    Past Surgical History:  Procedure Laterality Date   ABDOMINAL HYSTERECTOMY  1971    CATARACT EXTRACTION W/ INTRAOCULAR LENS  IMPLANT, BILATERAL Bilateral 2016   Dr. Jethro Bolus   IRRIGATION AND DEBRIDEMENT ABSCESS Left 02/23/2017   Procedure: IRRIGATION AND DEBRIDEMENT ABSCESS;  Surgeon: Kathryne Hitch, MD;  Location: [facility] OR;  Service: Orthopedics;  Laterality: Left;   REPAIR OF PERFORATED ULCER  1973 &1975   Dr Rolene Course   STOMACH SURGERY  [phone]   removed 1/2 stomach; "had a total of 5 ORs from 1973-1980; all related to perforated ulcer"   TONSILLECTOMY     Social History:   reports that she quit smoking about 29 years ago. Her smoking use included cigarettes. She started smoking about 69 years ago. She has a 40 pack-year smoking history. She has never used smokeless tobacco. She reports that she does not drink alcohol and does not use drugs.  Family History  Problem Relation Age of Onset   Obesity Mother    Neuropathy Neg Hx    Colon cancer Neg Hx    Esophageal cancer Neg Hx    Pancreatic cancer Neg Hx    Stomach cancer Neg Hx     Medications: Patient's Medications  New Prescriptions   No medications on file  Previous Medications   AMLODIPINE-VALSARTAN (EXFORGE) 10-320 MG TABLET    TAKE 1 TABLET BY MOUTH EVERY DAY   CONJUGATED ESTROGENS (PREMARIN) VAGINAL CREAM    Place 1 Application vaginally See admin instructions. Place a pea-sized amount vaginally once a day   CRANBERRY PO    Take 2 capsules by mouth daily.   CYANOCOBALAMIN 1000 MCG TABLET    Take 1 tablet (1,000 mcg total) by mouth daily.   FERROUS SULFATE 325 (65 FE) MG TABLET    Take 1 tablet (325 mg total) by mouth daily before breakfast.   GABAPENTIN (NEURONTIN) 300 MG CAPSULE    TAKE 1 CAPSULE(300 MG) BY MOUTH AT BEDTIME   MULTIPLE VITAMINS-MINERALS (PRESERVISION AREDS) CAPS    Take 1 capsule by mouth 2 (two) times daily.   NEBIVOLOL (BYSTOLIC) 10 MG TABLET    TAKE 1/2 TABLET(5 MG) BY MOUTH DAILY   OMEPRAZOLE (PRILOSEC) 20 MG CAPSULE    TAKE 1 CAPSULE(20 MG) BY MOUTH TWICE DAILY BEFORE A  MEAL   TRIAMCINOLONE CREAM (KENALOG) 0.1 %    Apply 1 Application topically 2 (two) times daily as needed (for itching).  Modified Medications   Modified Medication Previous Medication   LORAZEPAM (ATIVAN) 0.5 MG TABLET LORazepam (ATIVAN) 0.5 MG tablet      Take 1 tablet (0.5 mg total) by mouth daily as needed for anxiety.    TAKE 1 TABLET(0.5 MG) BY MOUTH TWICE DAILY. NEED FOLLOW UP BEFORE ADDITIONAL REFILLS  Discontinued Medications   ARTIFICIAL TEARS PF 0.1-0.3 % SOLN    Place 1 drop into both eyes 4 (four) times daily as needed (for dryness or irritation).   BENZONATATE (TESSALON) 100 MG CAPSULE    Take 1 capsule (100 mg total) by mouth 2 (two) times daily as needed for cough.   GEMTESA 75 MG TABS    Take 1 tablet (75 mg total)  by mouth daily.   LIVER OIL-ZINC OXIDE (DESITIN) 40 % OINTMENT    Apply 1 Application topically as needed for irritation.   MENTHOL-CETYLPYRIDINIUM (CEPACOL) 3 MG LOZENGE    Take 1 lozenge (3 mg total) by mouth as needed for sore throat.    Physical Exam:  Vitals:   04/05/23 1349  BP: 122/74  Pulse: 70  Resp: 17  Temp: 97.6 F (36.4 C)  TempSrc: Temporal  SpO2: 99%  Weight: 97 lb 9.6 oz (44.3 kg)  Height: 5\' 4"  (1.626 m)   Body mass index is 16.75 kg/m. Wt Readings from Last 3 Encounters:  04/05/23 97 lb 9.6 oz (44.3 kg)  02/26/23 108 lb 14.5 oz (49.4 kg)  02/22/23 103 lb 12.8 oz (47.1 kg)    Physical Exam Constitutional:      General: She is not in acute distress.    Appearance: She is well-developed. She is not diaphoretic.  HENT:     Head: Normocephalic and atraumatic.     Mouth/Throat:     Pharynx: No oropharyngeal exudate.  Eyes:     Conjunctiva/sclera: Conjunctivae normal.     Pupils: Pupils are equal, round, and reactive to light.  Cardiovascular:     Rate and Rhythm: Normal rate and regular rhythm.     Heart sounds: Normal heart sounds.  Pulmonary:     Effort: Pulmonary effort is normal.     Breath sounds: Normal breath sounds.   Abdominal:     General: Bowel sounds are normal.     Palpations: Abdomen is soft.  Musculoskeletal:     Cervical back: Normal range of motion and neck supple.     Right lower leg: No edema.     Left lower leg: No edema.  Skin:    General: Skin is warm and dry.  Neurological:     Mental Status: She is alert.  Psychiatric:        Mood and Affect: Mood normal.     Labs reviewed: Basic Metabolic Panel: Recent Labs    02/19/23 1354 02/22/23 1435 02/26/23 0542 02/27/23 0823 02/28/23 0549 03/01/23 0823  NA 132*   < > 137 139 137  --   K 4.1   < > 3.8 3.9 4.1  --   CL 97*   < > 113* 104 104  --   CO2 21   < > 17* 24 25  --   GLUCOSE 117   < > 100* 99 95  --   BUN 35*   < > 33* 26* 20  --   CREATININE 2.49*   < > 1.79* 1.75* 1.69*  --   CALCIUM 8.9   < > 7.6* 8.0* 8.0*  --   MG  --    < > 1.7 1.8 1.8 1.8  PHOS  --   --  2.2* 2.2* 2.8  --   TSH 0.40  --   --   --   --   --    < > = values in this interval not displayed.   Liver Function Tests: Recent Labs    02/26/23 0542 02/27/23 0823 02/28/23 0549  AST 15 21 20   ALT 11 13 12   ALKPHOS 75 91 81  BILITOT 0.4 0.4 0.4  PROT 5.0* 5.6* 5.1*  ALBUMIN 1.9* 2.2* 2.0*   Recent Labs    02/22/23 1927  LIPASE 20   No results for input(s): "AMMONIA" in the last 8760 hours. CBC: Recent Labs    02/26/23 0542  02/27/23 0823 02/28/23 0549  WBC 14.5* 15.2* 14.0*  NEUTROABS 11.9* 11.8* 11.2*  HGB 7.3* 8.1* 7.1*  HCT 22.5* 25.0* 23.2*  MCV 80.6 80.6 83.2  PLT 479* 531* 556*   Lipid Panel: Recent Labs    08/03/22 1453  CHOL 208*  HDL 140  LDLCALC 49  TRIG 161  CHOLHDL 1.5   TSH: Recent Labs    02/19/23 1354  TSH 0.40   A1C: Lab Results  Component Value Date   HGBA1C 6.0 (H) 08/03/2022     Assessment/Plan Assessment and Plan    Gallbladder mass Patient is under hospice care and has declined further workup or treatment. Symptom management is the primary focus. -Continue with hospice care for symptom  management.  Chronic Kidney Disease Recent hospitalization for kidney injury due to severe urinary tract infection. Current kidney function is not specified. Encourage proper hydration Follow metabolic panel Avoid nephrotoxic meds (NSAIDS)  Vitamin B12 Deficiency Noted on recent labs. Patient was unaware and has not been supplementing. -Start over-the-counter B12 supplement, 1000 mcg daily.  Constipation Patient reports discomfort in bowel area and uses stool softener for management. -Continue with stool softener as needed.  Hypertension Patient is on amlodipine/valsartan for blood pressure control. -Continue amlodipine/valsartan.  Gastroesophageal Reflux Disease Patient is on medication and reports symptoms are under control. -Continue current medication regimen.  Overactive Bladder Patient recently stopped medication due to perceived lack of efficacy. -No specific plan discussed.  Anxiety Patient is on Ativan at night and reports it is helpful. -Continue Ativan as needed at night.  Follow-up in 6 months or sooner if needed.      Return in about 6 months (around 10/03/2023) for routine follow up.  [name]. Biagio Borg [facility] [phone]

## 2023-04-05 NOTE — Patient Instructions (Signed)
To start vit b12 1000 mcg daily in am for b12 deficiency

## 2023-05-25 ENCOUNTER — Other Ambulatory Visit: Payer: Self-pay | Admitting: Nurse Practitioner

## 2023-05-25 DIAGNOSIS — I10 Essential (primary) hypertension: Secondary | ICD-10-CM

## 2023-06-07 ENCOUNTER — Ambulatory Visit: Payer: Self-pay | Admitting: Nurse Practitioner

## 2023-06-07 ENCOUNTER — Ambulatory Visit (INDEPENDENT_AMBULATORY_CARE_PROVIDER_SITE_OTHER): Admitting: Adult Health

## 2023-06-07 ENCOUNTER — Encounter: Payer: Self-pay | Admitting: Adult Health

## 2023-06-07 VITALS — BP 100/86 | HR 77 | Temp 97.2°F | Resp 16 | Ht 64.0 in | Wt 98.8 lb

## 2023-06-07 DIAGNOSIS — K828 Other specified diseases of gallbladder: Secondary | ICD-10-CM | POA: Diagnosis not present

## 2023-06-07 DIAGNOSIS — K219 Gastro-esophageal reflux disease without esophagitis: Secondary | ICD-10-CM

## 2023-06-07 DIAGNOSIS — K6289 Other specified diseases of anus and rectum: Secondary | ICD-10-CM | POA: Diagnosis not present

## 2023-06-07 DIAGNOSIS — N184 Chronic kidney disease, stage 4 (severe): Secondary | ICD-10-CM | POA: Diagnosis not present

## 2023-06-07 MED ORDER — HYDROCORTISONE (PERIANAL) 1 % EX CREA: 1.0000 | TOPICAL_CREAM | Freq: Every day | CUTANEOUS | Status: DC | PRN

## 2023-06-07 NOTE — Telephone Encounter (Signed)
 Chief Complaint: Rectum pain  Symptoms: "A lot of bowel movements," hurts to sit Frequency: Intermittent "for a while" Pertinent Negatives: Patient denies diarrhea, itching, blood in stool  Disposition: [x] Appointment(In office)  Additional Notes: Pt states she is under hospice care at home. Pt scheduled for an appointment in office today.    Copied from CRM [id number]. Topic: Clinical - Red Word Triage >> Jun 07, 2023  2:26 PM Eleanore Grey wrote: Red Word that prompted transfer to Nurse Triage: Patient is having extreme discomfort in rectum area, it's very painful to sit down. Reason for Disposition  SEVERE rectal pain (e.g., excruciating, unable to have a bowel movement)  Answer Assessment - Initial Assessment Questions SYMPTOM:  "What's the main symptom you're concerned about?" (e.g., pain, itching, swelling, rash)     Can't see if there is a rash; using hemorrhoid cream ONSET: "When did the pain  start?"     Ongoing issue, worse last night and this morning due to more bowel movements RECTAL PAIN: "Do you have any pain around your rectum?" "How bad is the pain?"  (Scale 0-10; or mild, moderate, severe)   - NONE (0): no pain   - MILD (1-3): doesn't interfere with normal activities    - MODERATE (4-7): interferes with normal activities or awakens from sleep, limping    - SEVERE (8-10): excruciating pain, unable to have a bowel movement      Depends if sitting, most of them around a 6; if sitting a 7-8  Protocols used: Rectal Symptoms-A-AH

## 2023-06-07 NOTE — Progress Notes (Signed)
 [facility] clinic  Provider:  Inge Mangle DNP  Code Status:  DNR  Goals of Care:     06/07/2023    3:11 PM  Advanced Directives  Does Patient Have a Medical Advance Directive? Yes  Type of Estate agent of [place];Living will;Out of facility DNR (pink MOST or yellow form)  Does patient want to make changes to medical advance directive? No - Patient declined  Copy of Healthcare Power of Attorney in Chart? Yes - validated most recent copy scanned in chart (See row information)     Chief Complaint  Patient presents with   Rectal Pain    Patient complains of rectal pain. Concerned it might be from cancer.   Discussed the use of AI scribe software for clinical note transcription with the patient, who gave verbal consent to proceed.   HPI: Patient is a 88 y.o. female seen today for an acute visit for rectal pain.  She experiences rectal pain, which she attributes to irritation possibly from hemorrhoids. She describes a sensation of hyperactive bowel movements, stating that 'whatever I eat seems to turn to poop quickly.' She has a high volume of formed bowel movements, particularly at night, and uses stool softeners to manage the volume. She also wears pull-ups due to incontinence, which she believes contributes to irritation. No diarrhea, redness, or swelling in the rectal area, but pain and irritation are present. The pain is localized to the rectum, with no abdominal pain.  She is under hospice care for gallbladder cancer, diagnosed in January 2025, and chronic kidney disease. She has chosen not to pursue treatment for the cancer and is focusing on symptom management. No biopsy or further workup for the cancer has been done as she opted out of treatment. She experiences trace edema in both legs, primarily on the right side.  She uses Prilosec for acid reflux, which is well-controlled when she takes the medication. She also uses Kenalog  triamcinolone  cream for  irritation around the vaginal area due to pull-ups. For pain management, she takes Tylenol , as prescribed by hospice care.  She reports no children and lives independently, with her boyfriend providing support. 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     Past Medical History:  Diagnosis Date   Age-related macular degeneration, dry, both eyes    Alcoholism (HCC)    Anemia    Anxiety    Cholelithiasis    Endometriosis    Goiter    Herpes simplex    Hyperlipidemia    Hypertension    Loss of weight    Nontoxic multinodular goiter    Other abnormal blood chemistry    PUD (peptic ulcer disease)    Renal cyst    Sciatica    Scoliosis (and kyphoscoliosis), idiopathic    Unspecified disorder of kidney and ureter    Urinary frequency     Past Surgical History:  Procedure Laterality Date   ABDOMINAL HYSTERECTOMY  1971   CATARACT EXTRACTION W/ INTRAOCULAR LENS  IMPLANT, BILATERAL Bilateral 2016   Dr. Albert Huff   IRRIGATION AND DEBRIDEMENT ABSCESS Left 02/23/2017   Procedure: IRRIGATION AND DEBRIDEMENT ABSCESS;  Surgeon: Arnie Lao, MD;  Location: [facility] OR;  Service: Orthopedics;  Laterality: Left;   REPAIR OF PERFORATED ULCER  1973 &1975   Dr Fronie Jewett   STOMACH SURGERY  [phone]   removed 1/2 stomach; "had a total of 5 ORs from 1973-1980; all related to perforated ulcer"   TONSILLECTOMY  Allergies  Allergen Reactions   Cymbalta  [Duloxetine  Hcl] Diarrhea    Outpatient Encounter Medications as of 06/07/2023  Medication Sig   amLODipine -valsartan  (EXFORGE ) 10-320 MG tablet TAKE 1 TABLET BY MOUTH EVERY DAY   conjugated estrogens (PREMARIN) vaginal cream Place 1 Application vaginally See admin instructions. Place a pea-sized amount vaginally once a day   CRANBERRY PO Take 2 capsules by mouth daily.   cyanocobalamin  1000 MCG tablet Take 1 tablet (1,000 mcg total) by mouth daily.   gabapentin  (NEURONTIN ) 300 MG capsule TAKE 1 CAPSULE(300 MG) BY  MOUTH AT BEDTIME   Hydrocortisone , Perianal, (PREPARATION H) 1 % CREA Apply 1 application  topically daily as needed.   LORazepam  (ATIVAN ) 0.5 MG tablet Take 1 tablet (0.5 mg total) by mouth daily as needed for anxiety.   Multiple Vitamins-Minerals (PRESERVISION AREDS) CAPS Take 1 capsule by mouth 2 (two) times daily.   nebivolol  (BYSTOLIC ) 10 MG tablet TAKE 1/2 TABLET(5 MG) BY MOUTH DAILY   omeprazole  (PRILOSEC) 20 MG capsule TAKE 1 CAPSULE(20 MG) BY MOUTH TWICE DAILY BEFORE A MEAL   triamcinolone  cream (KENALOG ) 0.1 % Apply 1 Application topically 2 (two) times daily as needed (for itching).   ferrous sulfate  325 (65 FE) MG tablet Take 1 tablet (325 mg total) by mouth daily before breakfast. (Patient not taking: Reported on 06/07/2023)   No facility-administered encounter medications on file as of 06/07/2023.    Review of Systems:  Review of Systems  Constitutional:  Negative for appetite change, chills, fatigue and fever.  HENT:  Negative for congestion, hearing loss, rhinorrhea and sore throat.   Eyes: Negative.   Respiratory:  Negative for cough, shortness of breath and wheezing.   Cardiovascular:  Negative for chest pain, palpitations and leg swelling.  Gastrointestinal:  Positive for rectal pain. Negative for abdominal pain, constipation, diarrhea, nausea and vomiting.  Genitourinary:  Negative for dysuria.  Musculoskeletal:  Negative for arthralgias, back pain and myalgias.  Skin:  Negative for color change, rash and wound.  Neurological:  Negative for dizziness, weakness and headaches.  Psychiatric/Behavioral:  Negative for behavioral problems. The patient is not nervous/anxious.     Health Maintenance  Topic Date Due   Pneumonia Vaccine 27+ Years old (1 of 2 - PCV) Never done   FOOT EXAM  01/20/2022   COVID-19 Vaccine (6 - 2024-25 season) 10/18/2022   HEMOGLOBIN A1C  02/02/2023   OPHTHALMOLOGY EXAM  07/26/2023 (Originally 09/25/2020)   Zoster Vaccines- Shingrix  Completed    HPV VACCINES  Aged Out   Meningococcal B Vaccine  Aged Out   DTaP/Tdap/Td  Discontinued    Physical Exam: Vitals:   06/07/23 1459  BP: 100/86  Pulse: 77  Resp: 16  Temp: (!) 97.2 F (36.2 C)  SpO2: 98%  Weight: 98 lb 12.8 oz (44.8 kg)  Height: 5\' 4"  (1.626 m)   Body mass index is 16.96 kg/m. Physical Exam Constitutional:      Appearance: Normal appearance.  HENT:     Head: Normocephalic and atraumatic.     Nose: Nose normal.     Mouth/Throat:     Mouth: Mucous membranes are moist.  Eyes:     Conjunctiva/sclera: Conjunctivae normal.  Cardiovascular:     Rate and Rhythm: Normal rate and regular rhythm.  Pulmonary:     Effort: Pulmonary effort is normal.     Breath sounds: Normal breath sounds.  Abdominal:     General: Bowel sounds are normal.     Palpations: Abdomen is soft.  Musculoskeletal:        General: Normal range of motion.     Cervical back: Normal range of motion.  Skin:    General: Skin is warm and dry.  Neurological:     General: No focal deficit present.     Mental Status: She is alert and oriented to person, place, and time.  Psychiatric:        Mood and Affect: Mood normal.        Behavior: Behavior normal.        Thought Content: Thought content normal.        Judgment: Judgment normal.     Labs reviewed: Basic Metabolic Panel: Recent Labs    02/19/23 1354 02/22/23 1435 02/26/23 0542 02/27/23 0823 02/28/23 0549 03/01/23 0823  NA 132*   < > 137 139 137  --   K 4.1   < > 3.8 3.9 4.1  --   CL 97*   < > 113* 104 104  --   CO2 21   < > 17* 24 25  --   GLUCOSE 117   < > 100* 99 95  --   BUN 35*   < > 33* 26* 20  --   CREATININE 2.49*   < > 1.79* 1.75* 1.69*  --   CALCIUM 8.9   < > 7.6* 8.0* 8.0*  --   MG  --    < > 1.7 1.8 1.8 1.8  PHOS  --   --  2.2* 2.2* 2.8  --   TSH 0.40  --   --   --   --   --    < > = values in this interval not displayed.   Liver Function Tests: Recent Labs    02/26/23 0542 02/27/23 0823 02/28/23 0549   AST 15 21 20   ALT 11 13 12   ALKPHOS 75 91 81  BILITOT 0.4 0.4 0.4  PROT 5.0* 5.6* 5.1*  ALBUMIN 1.9* 2.2* 2.0*   Recent Labs    02/22/23 1927  LIPASE 20   No results for input(s): "AMMONIA" in the last 8760 hours. CBC: Recent Labs    02/26/23 0542 02/27/23 0823 02/28/23 0549  WBC 14.5* 15.2* 14.0*  NEUTROABS 11.9* 11.8* 11.2*  HGB 7.3* 8.1* 7.1*  HCT 22.5* 25.0* 23.2*  MCV 80.6 80.6 83.2  PLT 479* 531* 556*   Lipid Panel: Recent Labs    08/03/22 1453  CHOL 208*  HDL 140  LDLCALC 49  TRIG 409  CHOLHDL 1.5   Lab Results  Component Value Date   HGBA1C 6.0 (H) 08/03/2022    Procedures since last visit: No results found.  Assessment/Plan  1. Gallbladder mass (Primary) -  Gallbladder cancer diagnosed in January 2025. Opted for hospice care, declined further treatment.  2. Rectal pain -  - Continue Desitin cream for irritation management. -  Rectal pain likely from irritation due to frequent bowel movements. Tylenol  insufficient for relief. - Apply Preparation H externally once daily as needed. - Consider ice application for pain relief. - Continue Tylenol  as needed. - Hydrocortisone , Perianal, (PREPARATION H) 1 % CREA; Apply 1 application  topically daily as needed.  3. Chronic kidney disease, stage 4 (severe) (HCC) -  Stage 4 CKD well-managed under hospice care.  4. Gastroesophageal reflux disease without esophagitis -  Acid reflux well-controlled with Prilosec and dietary adjustments. - Continue Prilosec.        Labs/tests ordered:   None   Return if symptoms worsen  or fail to improve.  Andreika Vandagriff Medina-Vargas, NP

## 2023-07-18 DEATH — deceased

## 2023-08-16 ENCOUNTER — Ambulatory Visit: Payer: 59 | Admitting: Nurse Practitioner

## 2023-10-04 ENCOUNTER — Ambulatory Visit: Payer: 59 | Admitting: Nurse Practitioner
